# Patient Record
Sex: Female | Born: 1940 | Race: White | Hispanic: No | Marital: Single | Smoking: Never smoker
Health system: Southern US, Community
[De-identification: ages and names within clinical notes are randomized; demographics above are authoritative.]

## PROBLEM LIST (undated history)

## (undated) DIAGNOSIS — N952 Postmenopausal atrophic vaginitis: Secondary | ICD-10-CM

## (undated) DIAGNOSIS — M1990 Unspecified osteoarthritis, unspecified site: Secondary | ICD-10-CM

## (undated) DIAGNOSIS — R002 Palpitations: Secondary | ICD-10-CM

## (undated) DIAGNOSIS — G43909 Migraine, unspecified, not intractable, without status migrainosus: Secondary | ICD-10-CM

## (undated) DIAGNOSIS — I10 Essential (primary) hypertension: Secondary | ICD-10-CM

## (undated) DIAGNOSIS — Z87448 Personal history of other diseases of urinary system: Secondary | ICD-10-CM

## (undated) DIAGNOSIS — I4891 Unspecified atrial fibrillation: Secondary | ICD-10-CM

## (undated) DIAGNOSIS — N63 Unspecified lump in unspecified breast: Secondary | ICD-10-CM

## (undated) DIAGNOSIS — G2581 Restless legs syndrome: Secondary | ICD-10-CM

## (undated) DIAGNOSIS — E785 Hyperlipidemia, unspecified: Secondary | ICD-10-CM

## (undated) HISTORY — PX: TOTAL ABDOMINAL HYSTERECTOMY: SHX209

## (undated) HISTORY — DX: Unspecified lump in unspecified breast: N63.0

## (undated) HISTORY — DX: Essential (primary) hypertension: I10

## (undated) HISTORY — DX: Restless legs syndrome: G25.81

## (undated) HISTORY — DX: Personal history of other diseases of urinary system: Z87.448

## (undated) HISTORY — DX: Unspecified osteoarthritis, unspecified site: M19.90

## (undated) HISTORY — DX: Hyperlipidemia, unspecified: E78.5

## (undated) HISTORY — DX: Postmenopausal atrophic vaginitis: N95.2

## (undated) HISTORY — PX: HERNIA REPAIR: SHX51

## (undated) HISTORY — DX: Palpitations: R00.2

## (undated) HISTORY — PX: APPENDECTOMY: SHX54

## (undated) HISTORY — PX: COLONOSCOPY: SHX174

---

## 2000-08-27 ENCOUNTER — Encounter: Payer: Self-pay | Admitting: Family Medicine

## 2000-08-27 ENCOUNTER — Ambulatory Visit (HOSPITAL_COMMUNITY): Admission: RE | Admit: 2000-08-27 | Discharge: 2000-08-27 | Payer: Self-pay | Admitting: Family Medicine

## 2000-09-01 ENCOUNTER — Ambulatory Visit (HOSPITAL_COMMUNITY): Admission: RE | Admit: 2000-09-01 | Discharge: 2000-09-01 | Payer: Self-pay | Admitting: Family Medicine

## 2000-09-01 ENCOUNTER — Encounter: Payer: Self-pay | Admitting: Family Medicine

## 2000-09-04 ENCOUNTER — Ambulatory Visit (HOSPITAL_COMMUNITY): Admission: RE | Admit: 2000-09-04 | Discharge: 2000-09-04 | Payer: Self-pay | Admitting: Family Medicine

## 2000-09-04 ENCOUNTER — Encounter: Payer: Self-pay | Admitting: Family Medicine

## 2000-10-28 ENCOUNTER — Ambulatory Visit (HOSPITAL_COMMUNITY): Admission: RE | Admit: 2000-10-28 | Discharge: 2000-10-28 | Payer: Self-pay | Admitting: Gastroenterology

## 2001-08-20 ENCOUNTER — Encounter: Payer: Self-pay | Admitting: Specialist

## 2001-08-20 ENCOUNTER — Ambulatory Visit (HOSPITAL_COMMUNITY): Admission: RE | Admit: 2001-08-20 | Discharge: 2001-08-20 | Payer: Self-pay | Admitting: Specialist

## 2002-03-16 ENCOUNTER — Encounter: Admission: RE | Admit: 2002-03-16 | Discharge: 2002-03-16 | Payer: Self-pay | Admitting: Family Medicine

## 2002-03-16 ENCOUNTER — Encounter: Payer: Self-pay | Admitting: Family Medicine

## 2002-04-10 ENCOUNTER — Encounter: Admission: RE | Admit: 2002-04-10 | Discharge: 2002-04-10 | Payer: Self-pay | Admitting: Neurology

## 2002-04-10 ENCOUNTER — Encounter: Payer: Self-pay | Admitting: Neurology

## 2002-11-10 ENCOUNTER — Encounter: Payer: Self-pay | Admitting: Specialist

## 2002-11-10 ENCOUNTER — Ambulatory Visit (HOSPITAL_COMMUNITY): Admission: RE | Admit: 2002-11-10 | Discharge: 2002-11-10 | Payer: Self-pay | Admitting: Specialist

## 2002-12-05 ENCOUNTER — Encounter: Payer: Self-pay | Admitting: Neurology

## 2002-12-05 ENCOUNTER — Encounter: Admission: RE | Admit: 2002-12-05 | Discharge: 2002-12-05 | Payer: Self-pay | Admitting: Neurology

## 2003-08-05 HISTORY — PX: CARDIAC CATHETERIZATION: SHX172

## 2003-10-01 ENCOUNTER — Emergency Department (HOSPITAL_COMMUNITY): Admission: EM | Admit: 2003-10-01 | Discharge: 2003-10-01 | Payer: Self-pay | Admitting: Emergency Medicine

## 2003-11-24 ENCOUNTER — Ambulatory Visit (HOSPITAL_COMMUNITY): Admission: RE | Admit: 2003-11-24 | Discharge: 2003-11-24 | Payer: Self-pay | Admitting: Otolaryngology

## 2004-01-11 ENCOUNTER — Encounter: Payer: Self-pay | Admitting: Cardiology

## 2004-01-11 ENCOUNTER — Encounter (INDEPENDENT_AMBULATORY_CARE_PROVIDER_SITE_OTHER): Payer: Self-pay | Admitting: Physician Assistant

## 2004-01-11 ENCOUNTER — Inpatient Hospital Stay (HOSPITAL_COMMUNITY): Admission: EM | Admit: 2004-01-11 | Discharge: 2004-01-13 | Payer: Self-pay | Admitting: Emergency Medicine

## 2004-01-12 ENCOUNTER — Encounter: Payer: Self-pay | Admitting: Cardiology

## 2004-04-09 ENCOUNTER — Ambulatory Visit (HOSPITAL_COMMUNITY): Admission: RE | Admit: 2004-04-09 | Discharge: 2004-04-09 | Payer: Self-pay | Admitting: Obstetrics and Gynecology

## 2004-04-17 ENCOUNTER — Encounter: Admission: RE | Admit: 2004-04-17 | Discharge: 2004-04-17 | Payer: Self-pay | Admitting: Obstetrics and Gynecology

## 2004-06-17 ENCOUNTER — Ambulatory Visit (HOSPITAL_COMMUNITY): Admission: RE | Admit: 2004-06-17 | Discharge: 2004-06-17 | Payer: Self-pay | Admitting: Gastroenterology

## 2004-06-17 ENCOUNTER — Encounter (INDEPENDENT_AMBULATORY_CARE_PROVIDER_SITE_OTHER): Payer: Self-pay | Admitting: *Deleted

## 2004-10-24 ENCOUNTER — Ambulatory Visit: Payer: Self-pay | Admitting: Cardiology

## 2005-04-14 ENCOUNTER — Ambulatory Visit (HOSPITAL_COMMUNITY): Admission: RE | Admit: 2005-04-14 | Discharge: 2005-04-14 | Payer: Self-pay | Admitting: Specialist

## 2006-04-15 ENCOUNTER — Ambulatory Visit (HOSPITAL_COMMUNITY): Admission: RE | Admit: 2006-04-15 | Discharge: 2006-04-15 | Payer: Self-pay | Admitting: Specialist

## 2007-02-16 ENCOUNTER — Encounter: Payer: Self-pay | Admitting: Cardiology

## 2007-02-16 ENCOUNTER — Ambulatory Visit: Payer: Self-pay | Admitting: Cardiology

## 2007-08-03 ENCOUNTER — Ambulatory Visit (HOSPITAL_COMMUNITY): Admission: RE | Admit: 2007-08-03 | Discharge: 2007-08-03 | Payer: Self-pay | Admitting: Obstetrics and Gynecology

## 2008-08-21 ENCOUNTER — Ambulatory Visit (HOSPITAL_COMMUNITY): Admission: RE | Admit: 2008-08-21 | Discharge: 2008-08-21 | Payer: Self-pay | Admitting: Family Medicine

## 2008-09-02 ENCOUNTER — Encounter: Payer: Self-pay | Admitting: Cardiology

## 2009-02-23 ENCOUNTER — Encounter: Payer: Self-pay | Admitting: Cardiology

## 2009-04-01 ENCOUNTER — Ambulatory Visit: Payer: Self-pay | Admitting: Cardiology

## 2009-04-03 DIAGNOSIS — R002 Palpitations: Secondary | ICD-10-CM

## 2009-04-04 ENCOUNTER — Encounter: Payer: Self-pay | Admitting: Cardiology

## 2009-04-13 ENCOUNTER — Ambulatory Visit: Payer: Self-pay | Admitting: Cardiology

## 2009-04-13 DIAGNOSIS — E663 Overweight: Secondary | ICD-10-CM | POA: Insufficient documentation

## 2009-04-13 DIAGNOSIS — I2510 Atherosclerotic heart disease of native coronary artery without angina pectoris: Secondary | ICD-10-CM | POA: Insufficient documentation

## 2009-04-13 DIAGNOSIS — F411 Generalized anxiety disorder: Secondary | ICD-10-CM

## 2009-06-04 ENCOUNTER — Encounter: Payer: Self-pay | Admitting: Cardiology

## 2009-08-24 ENCOUNTER — Ambulatory Visit (HOSPITAL_COMMUNITY): Admission: RE | Admit: 2009-08-24 | Discharge: 2009-08-24 | Payer: Self-pay | Admitting: Obstetrics and Gynecology

## 2009-09-29 ENCOUNTER — Emergency Department (HOSPITAL_COMMUNITY)
Admission: EM | Admit: 2009-09-29 | Discharge: 2009-09-29 | Payer: Self-pay | Source: Home / Self Care | Admitting: Emergency Medicine

## 2009-09-29 ENCOUNTER — Encounter: Payer: Self-pay | Admitting: Cardiology

## 2009-10-14 ENCOUNTER — Encounter: Payer: Self-pay | Admitting: Cardiology

## 2010-08-19 ENCOUNTER — Encounter: Payer: Self-pay | Admitting: Cardiology

## 2010-08-19 ENCOUNTER — Ambulatory Visit
Admission: RE | Admit: 2010-08-19 | Discharge: 2010-08-19 | Payer: Self-pay | Source: Home / Self Care | Attending: Cardiology | Admitting: Cardiology

## 2010-08-19 ENCOUNTER — Encounter (INDEPENDENT_AMBULATORY_CARE_PROVIDER_SITE_OTHER): Payer: Self-pay | Admitting: *Deleted

## 2010-08-21 ENCOUNTER — Encounter: Payer: Self-pay | Admitting: Cardiology

## 2010-08-26 ENCOUNTER — Ambulatory Visit (HOSPITAL_COMMUNITY)
Admission: RE | Admit: 2010-08-26 | Discharge: 2010-08-26 | Payer: Medicare Other | Source: Home / Self Care | Attending: Obstetrics and Gynecology | Admitting: Obstetrics and Gynecology

## 2010-08-29 ENCOUNTER — Other Ambulatory Visit (HOSPITAL_COMMUNITY): Payer: Self-pay | Admitting: *Deleted

## 2010-08-29 ENCOUNTER — Encounter (INDEPENDENT_AMBULATORY_CARE_PROVIDER_SITE_OTHER): Payer: Self-pay | Admitting: *Deleted

## 2010-08-29 DIAGNOSIS — R928 Other abnormal and inconclusive findings on diagnostic imaging of breast: Secondary | ICD-10-CM

## 2010-09-04 ENCOUNTER — Other Ambulatory Visit: Payer: Self-pay

## 2010-09-04 ENCOUNTER — Ambulatory Visit (HOSPITAL_COMMUNITY)
Admission: RE | Admit: 2010-09-04 | Discharge: 2010-09-04 | Disposition: A | Discharge status: Home / Self Care | Payer: Medicare Other | Source: Ambulatory Visit

## 2010-09-04 ENCOUNTER — Ambulatory Visit (HOSPITAL_COMMUNITY)
Admission: RE | Admit: 2010-09-04 | Discharge: 2010-09-04 | Disposition: A | Discharge status: Home / Self Care | Payer: Medicare Other | Source: Ambulatory Visit | Attending: Obstetrics and Gynecology | Admitting: Obstetrics and Gynecology

## 2010-09-04 ENCOUNTER — Ambulatory Visit (HOSPITAL_COMMUNITY): Admission: RE | Admit: 2010-09-04 | Payer: Self-pay | Source: Home / Self Care | Admitting: Obstetrics and Gynecology

## 2010-09-04 DIAGNOSIS — N63 Unspecified lump in unspecified breast: Secondary | ICD-10-CM | POA: Insufficient documentation

## 2010-09-04 DIAGNOSIS — R928 Other abnormal and inconclusive findings on diagnostic imaging of breast: Secondary | ICD-10-CM

## 2010-09-04 DIAGNOSIS — N6320 Unspecified lump in the left breast, unspecified quadrant: Secondary | ICD-10-CM

## 2010-09-05 NOTE — Letter (Signed)
Summary: Discharge Summary  Discharge Summary   Imported By: Dorise Hiss 08/19/2010 14:46:22  _____________________________________________________________________  External Attachment:    Type:   Image     Comment:   External Document

## 2010-09-05 NOTE — Assessment & Plan Note (Signed)
Summary: Hailey [name]   Visit Type:  Follow-up Primary Provider:  Dr. Donzetta Sprung  CC:  palpitations.  History of Present Illness: The patient presents for followup palpitations. She has had these for 2 years every night. She wore an event monitor in August of 2021 days. There were rare PVCs but did not correspond to her symptoms. When she didn't feel her nightly palpitations she was always in normal sinus rhythm. Please continue unabated. She did present to the emergency room and that you are in last year with similar symptoms but there were no abnormalities identified. She does rarely gets chest discomfort which feels like a fist in her chest but this is nonreproducible. She does do some walking but infrequently. With this activity she cannot bring on chest discomfort, excessive shortness of breath, palpitations. She has no presyncope, syncope, PND or orthopnea. She is quite concerned because her brother died of heart disease suddenly though she then reports that he was a heavy smoker and drinker.  Preventive Screening-Counseling & Management  Alcohol-Tobacco     Smoking Status: never  Current Medications (verified): 1)  Metoprolol Tartrate 50 Mg Tabs (Metoprolol Tartrate) .... Take 1 Tablet By Mouth Every Am 2)  Centrum Silver  Tabs (Multiple Vitamins-Minerals) .... Take 1 Tablet By Mouth Once A Day 3)  Calcium Carbonate-Vitamin D 600-400 Mg-Unit  Tabs (Calcium Carbonate-Vitamin D) .... Take 1 Tablet By Mouth Two Times A Day 4)  Estroven Maximum Strength  Tabs (Nutritional Supplements) .... Take 1 Tablet By Mouth Once A Day 5)  Metoprolol Tartrate 25 Mg Tabs (Metoprolol Tartrate) .... Take 1 Tablet By Mouth Every Night  Allergies (verified): No Known Drug Allergies  Comments:  Nurse/Medical Assistant: The patient's medications and allergies were reviewed with the patient and were updated in the Medication and Allergy Lists. reviewed list w/ pt. Tammi Romine CMA (August 19, 2010 3:22 PM)  Past History:  Past Medical History: Reviewed history from 09/10/[name] and no changes required. Meniere's disease PALPITATIONS (ICD-785. Hyperlipidemia   Past Surgical History: Reviewed history from 08/19/2010 and no changes required. Cardiac Catheterization  2005  [facility] Abdominal Hysterectomy-Total Appendectomy  Social History: Smoking Status:  never  Review of Systems       As stated in the HPI and negative for all other systems.   Vital Signs:  Patient profile:   70 year old female Height:      68 inches Weight:      188 pounds BMI:     28.69 Pulse rate:   55 / minute BP sitting:   129 / 75  (left arm) Cuff size:   regular  Vitals Entered By: Fuller Plan CMA (August 19, 2010 3:22 PM)  Physical Exam  General:  Well developed, well nourished, in no acute distress. Head:  normocephalic and atraumatic Eyes:  PERRLA/EOM intact; conjunctiva and lids normal. Mouth:  Teeth, gums and palate normal. Oral mucosa normal. Neck:  Neck supple, no JVD. No masses, thyromegaly or abnormal cervical nodes. Chest Wall:  no deformities  Lungs:  Clear bilaterally to auscultation and percussion. Abdomen:  Bowel sounds positive; abdomen soft and non-tender without masses, organomegaly, or hernias noted. No hepatosplenomegaly. Msk:  Back normal, normal gait. Muscle strength and tone normal. Extremities:  No clubbing or cyanosis. Neurologic:  Alert and oriented x 3. Skin:  Intact without lesions or rashes. Cervical Nodes:  no significant adenopathy Axillary Nodes:  no significant adenopathy Inguinal Nodes:  no significant adenopathy Psych:  Normal affect.  Detailed Cardiovascular Exam  Neck    Carotids: Carotids full and equal bilaterally without bruits.      Neck Veins: Normal, no JVD.    Heart    Inspection: no deformities or lifts noted.      Palpation: normal PMI with no thrills palpable.      Auscultation: regular rate and rhythm, S1, S2 without murmurs,  rubs, gallops, or clicks.    Vascular    Abdominal Aorta: no palpable masses, pulsations, or audible bruits.      Femoral Pulses: normal femoral pulses bilaterally.      Pedal Pulses: normal pedal pulses bilaterally.      Radial Pulses: normal radial pulses bilaterally.      Peripheral Circulation: no clubbing, cyanosis, or edema noted with normal capillary refill.     EKG  Procedure date:  08/19/2010  Findings:      sinus bradycardia, rate 60, axis within normal limits, poor anterior R-wave progression, low voltage in chest leads, nonspecific anterolateral and inferior T-wave flattening, QTC difficult to assess but does not appear to be prolonged  Impression & Recommendations:  Problem # 1:  CORONARY ARTERY DISEASE (ICD-414.00) She did have some mild nonobstructive plaquing 2005 on catheter. Her last stress test was a nuclear study in 2008. I do think it is prudent to screen her with exercise treadmill testing to look for any high grade obstructive disease and more importantly to give her a prescription for exercise based on this. She will return for this. Orders: EKG w/ Interpretation (93000) GXT (GXT)  Problem # 2:  PALPITATIONS (ICD-785.1) We discussed these at length. They're exactly the same as when she is more previous monitors. Back she hasn't had any for 2 days which is unusual. I do not think there is any utility in repeating a monitor and once her symptoms have changed. She agrees with this and no change in therapy is indicated. Orders: EKG w/ Interpretation (93000) GXT (GXT)  Problem # 3:  GENERALIZED ANXIETY DISORDER (ICD-300.02) I do think this contributes and with her per her primary provider.  Patient Instructions: 1)  Your physician wants you to follow-up in: 1 year. You will receive a reminder letter in the mail one-two months in advance. If you don't receive a letter, please call our office to schedule the follow-up appointment. 2)  Your physician recommends  that you continue on your current medications as directed. Please refer to the Current Medication list given to you today. 3)  Your physician has requested that you have an exercise tolerance test.  For further information please visit [email or web address]  Please also follow instruction sheet, as given.

## 2010-09-05 NOTE — Letter (Signed)
Summary: Engineer, materials at [facility]  [address]   [place], [place] [address]   Phone: [phone]  Fax: [phone]        August 29, 2010 MRN: [id number]    Hailey [name] [address] [place], [place]  [address]    Dear [name],  Your test ordered by Selena Batten has been reviewed by your physician (or physician assistant) and was found to be normal or stable. Your physician (or physician assistant) felt no changes were needed at this time.  ____ Echocardiogram  _X__ Cardiac Stress Test  ____ Lab Work  ____ Peripheral vascular study of arms, legs or neck  ____ CT scan or X-ray  ____ Lung or Breathing test  ____ Other:   Thank you.   Cyril Loosen, RN, BSN    Duane Boston, M.D., F.A.C.C. Thressa Sheller, M.D., F.A.C.C. Oneal Grout, M.D., F.A.C.C. Cheree Ditto, M.D., F.A.C.C. Daiva Nakayama, M.D., F.A.C.C. Kenney Houseman, M.D., F.A.C.C. Jeanne Ivan, PA-C

## 2010-09-05 NOTE — Letter (Signed)
Summary: Graded Exercise Tolerance Test  [facility] [address]   [place], [place] [address]   Phone: [phone]  Fax: [phone]      [facility] Cardi[facility] Services  Graded Exercise Tolerance Test    [facility]  Appointment Date:_  Appointment Time:_   Your doctor has ordered a stress test to help determine the condition of your  heart during exercise. If you take blood pressure medicine , ask your doctor if you should take it the day of your test. You may eat a light meal before your test.  Please be sure to bring the copy of your order with you.   You should dress comfortably, for example: Sweat pants, shorts, or skirt, Loose                                                                                                       short sleeved T-shirt, Rubber soled lace-up shoes (tennis shoes)  You will need to arrive 15 minutes before your appointment time. You will also need to enter at the Main Entrance of the hospital and go to the registration desk. They will direct you to the Cardiovascular Department on the third floor.  You will need to plan on being at the hospital for one hour from registration for this appointment.   Hold your Metoprolol (Lopressor) for 24 hours before test.   If the results of your test are normal or stable, you will receive a letter. If they are abnormal, the nurse will contact you by phone.

## 2010-09-05 NOTE — Cardiovascular Report (Signed)
Summary: Cardiac Catheterization  Cardiac Catheterization   Imported By: Dorise Hiss 08/19/2010 14:44:33  _____________________________________________________________________  External Attachment:    Type:   Image     Comment:   External Document

## 2010-09-09 ENCOUNTER — Other Ambulatory Visit: Payer: Self-pay | Admitting: Dermatology

## 2010-09-09 ENCOUNTER — Encounter (HOSPITAL_COMMUNITY): Payer: Self-pay

## 2010-10-11 ENCOUNTER — Other Ambulatory Visit: Payer: Self-pay | Admitting: Dermatology

## 2010-10-23 LAB — COMPREHENSIVE METABOLIC PANEL
ALT: 19 U/L (ref 0–35)
BUN: 18 mg/dL (ref 6–23)
CO2: 27 mEq/L (ref 19–32)
Calcium: 9.1 mg/dL (ref 8.4–10.5)
GFR calc Af Amer: 45 mL/min — ABNORMAL LOW (ref >60)
Glucose, Bld: 100 mg/dL — ABNORMAL HIGH (ref 70–99)
Potassium: 4 mEq/L (ref 3.5–5.1)
Sodium: 139 mEq/L (ref 135–145)

## 2010-10-23 LAB — URINALYSIS, ROUTINE W REFLEX MICROSCOPIC
Glucose, UA: NEGATIVE mg/dL
Ketones, ur: NEGATIVE mg/dL
Leukocytes, UA: NEGATIVE
Nitrite: NEGATIVE
Protein, ur: NEGATIVE mg/dL
pH: 5.5 (ref 5.0–8.0)

## 2010-10-23 LAB — URINE MICROSCOPIC-ADD ON

## 2010-10-23 LAB — DIFFERENTIAL
Basophils Absolute: 0 10*3/uL (ref 0.0–0.1)
Eosinophils Absolute: 0.1 10*3/uL (ref 0.0–0.7)
Eosinophils Relative: 1 % (ref 0–5)
Lymphocytes Relative: 38 % (ref 12–46)
Lymphs Abs: 2.6 10*3/uL (ref 0.7–4.0)
Monocytes Absolute: 0.5 10*3/uL (ref 0.1–1.0)

## 2010-10-23 LAB — POCT CARDIAC MARKERS: CKMB, poc: <1 ng/mL — ABNORMAL LOW (ref 1.0–8.0)

## 2010-10-23 LAB — CBC
Platelets: 219 10*3/uL (ref 150–400)
RBC: 3.97 MIL/uL (ref 3.87–5.11)
WBC: 6.8 10*3/uL (ref 4.0–10.5)

## 2010-11-22 ENCOUNTER — Other Ambulatory Visit (HOSPITAL_COMMUNITY): Payer: Self-pay | Admitting: Family Medicine

## 2010-11-22 DIAGNOSIS — N6320 Unspecified lump in the left breast, unspecified quadrant: Secondary | ICD-10-CM

## 2010-11-25 ENCOUNTER — Other Ambulatory Visit: Payer: Self-pay | Admitting: Dermatology

## 2010-12-16 ENCOUNTER — Other Ambulatory Visit: Payer: Self-pay | Admitting: Obstetrics & Gynecology

## 2010-12-16 DIAGNOSIS — N63 Unspecified lump in unspecified breast: Secondary | ICD-10-CM

## 2010-12-16 DIAGNOSIS — Z139 Encounter for screening, unspecified: Secondary | ICD-10-CM

## 2010-12-19 ENCOUNTER — Ambulatory Visit (HOSPITAL_COMMUNITY)
Admission: RE | Admit: 2010-12-19 | Discharge: 2010-12-19 | Disposition: A | Discharge status: Home / Self Care | Payer: Medicare Other | Source: Ambulatory Visit | Attending: Obstetrics & Gynecology | Admitting: Obstetrics & Gynecology

## 2010-12-19 DIAGNOSIS — Z13820 Encounter for screening for osteoporosis: Secondary | ICD-10-CM | POA: Insufficient documentation

## 2010-12-19 DIAGNOSIS — Z780 Asymptomatic menopausal state: Secondary | ICD-10-CM | POA: Insufficient documentation

## 2010-12-19 DIAGNOSIS — Z139 Encounter for screening, unspecified: Secondary | ICD-10-CM

## 2010-12-20 NOTE — Op Note (Signed)
NAMEHISAE, DECOURSEY NO.:  [id number]   MEDICAL RECORD NO.:  [id number]          PATIENT TYPE:  AMB   LOCATION:  ENDO                         FACILITY:  [facility]   PHYSICIAN:  Petra Kuba, M.D.    DATE OF BIRTH:  [birth year]   DATE OF PROCEDURE:  06/17/2004  DATE OF DISCHARGE:                                 OPERATIVE REPORT   PROCEDURE:  Esophagogastroduodenoscopy with biopsy.   ENDOSCOPIST:  Petra Kuba, M.D.   INDICATION:  Upper tract symptoms, not responding to the usual medicines.  Consent was signed after risks, benefits, methods, and options were  thoroughly discussed in the office.   MEDICINES USED:  Fentanyl 50 mcg, Versed 5 mg.   DESCRIPTION OF PROCEDURE:  The video endoscope was inserted by direct  vision.  Esophagus proximal and mid were normal.  In the distal esophagus  was a tiny hiatal hernia, possibly a very short segment of Barrett's which  was biopsied as well as the GE junction at the end of the procedure and put  in the first and only container.  The scope was passed into the stomach and  advanced through a normal antrum, normal pylorus, into a normal duodenal  bulb and around the celiac to a normal second portion of the duodenum.  The  scope was withdrawn back to the bulb which confirmed its normal appearance.  The scope was withdrawn back to the stomach and retroflexed.  The cardia,  fundus, angularis, lesser and greater curve were normal on retroflexed  visualization.  On straight visualization, the stomach was normal.  The air  was suctioned.  The scope was slowly withdrawn back to about 18 cm.  Again,  the rest of the esophagus was normal.  We then re-advanced to the  distal  esophagus. The biopsies were obtained at the juncture.  The scope was  removed.  The patient tolerated the procedure well.  There was no obvious  immediate complication.   ENDOSCOPIC DIAGNOSES:  1.  Tiny hiatal hernia.  2.  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  3.  Otherwise normal esophagogastroduodenoscopy.   PLAN:  Await pathology to determine future Barrett's screening, continue  trying various pump inhibitors, possibly b.i.d., possibly even Reglan.  Follow up with me p.r.n. or in two months.      MEM/MEDQ  D:  06/17/2004  T:  06/17/2004  Job:  [id number]   cc:   Magnus Sinning. Dimple Casey, M.D.  [address] [place]  [place] [address]  Fax: [phone]

## 2010-12-20 NOTE — H&P (Signed)
NAME:  Hailey [name], Hailey [name]                         ACCOUNT NO.:  [id number]   MEDICAL RECORD NO.:  [id number]                   PATIENT TYPE:  INP   LOCATION:  4731                                 FACILITY:  [facility]   PHYSICIAN:  Arturo Morton. Riley Kill, M.D. [facility]         DATE OF BIRTH:  1941/07/28   DATE OF ADMISSION:  01/11/2004  DATE OF DISCHARGE:                                HISTORY & PHYSICAL   CHIEF COMPLAINT:  Chest pain.   HISTORY OF PRESENT ILLNESS:  The patient is a 70 year old white female who  presents to the emergency room with a four-month history of episodic  substernal chest discomfort.  Some of it occurs at rest, and some with  exertion.  Today she had 5/10 chest pain, and came to the emergency room  where she was seen and started on some nitroglycerin.  With this, she had  borderline ST-T wave abnormalities.  There was no change with position,  cough or deep inspiration and no associated diaphoresis, nausea or vomiting.  She has had some tachy palpitations that have occurred for the past three  months as well.  She was at an overnight admission at [facility] and  apparently told by the nursing staff that she might have paroxysmal atrial  fibrillation which perhaps was brief two to three seconds at most.   PAST MEDICAL HISTORY:  1. The patient had a treadmill in 2005 by Dr. Rollene Rotunda.  She went     seven minutes without diagnostic chest pain or EKG changes.  2. Meniere's disease.  3. Borderline hyperlipidemia.  4. History of colonoscopy.  5. History of hysterectomy.  6. History of appendectomy.   DRUG SENSITIVITIES:  Include aspirin which causes gastroesophageal reflux.   MEDICATIONS:  1. Hydrochlorothiazide, triamterene 37/25.  2. Potassium 10 mEq t.i.d.  3. Lexapro 10 daily.  4. Aciphex 20 daily.  5. Advil.  6. Multivitamin p.r.n.   SOCIAL HISTORY:  The patient lives in [place].  She lives alone.  She is a  retired [employer].  She does not smoke or  drink.   FAMILY HISTORY:  Mother died at 24 of an infection.  Father died at 77, and  had coronary artery disease.  She has a sister who has mitral valve  prolapse.  She has a brother who has had transient ischemic attacks.   REVIEW OF SYSTEMS:  Remarkable for some chronic dyspnea of effort that has  been more noticeable.  She has had some arthralgias.   PHYSICAL EXAMINATION:  GENERAL:  The patient is an alert and oriented  female, in no acute distress.  She can provide a valuable history.  She is  well developed, well-nourished.  No carotid bruits.  VITAL SIGNS:  Temperature 97.4, pulse 60, respiratory rate 18, blood  pressure 131/77.  LUNGS:  The lung fields are clear to auscultation and percussion.  HEART:  The PMI is nondisplaced.  There are normal first and second heart  sounds.  No systolic or diastolic murmurs are appreciated.  ABDOMEN:  Soft without hepatosplenomegaly.  EXTREMITIES:  Revealed no edema.  Distal pulses are intact.   ELECTROCARDIOGRAM:  The electrocardiogram done in the emergency room  demonstrates normal sinus rhythm with T wave inversion in V2 and V3,  flattening of the T waves in V4 and V5, as well as T wave inversion in lead  3.   LABORATORY DATA:  Creatinine 1.3.  Other laboratory studies are pending.  White count 6400, hemoglobin 13, hematocrit 38.6 and platelets 306,000.   IMPRESSION:  1. Recurrent chest discomfort despite normal treadmill with subtle     electrocardiographic abnormalities.  2. Borderline hyperlipidemia.  3. Hiatal hernia.  4. Appendectomy.  5. Hysterectomy.  6. Question gastroesophageal reflux.   PLAN:  I have discussed the various options with the patient.  She wants to  know what is going on.  We had talked about cardiac catheterization versus  doing a stress Cardiolite image given the findings.  We will recommend  cardiac catheterization.  The risks, benefits and alternatives have been   discussed with the patient, and she is agreeable to proceed.                                                Arturo Morton. Riley Kill, M.D. [facility]    TDS/MEDQ  D:  01/11/2004  T:  01/12/2004  Job:  [id number]

## 2010-12-20 NOTE — Discharge Summary (Signed)
NAME:  Hailey [name], Hailey [name]                         ACCOUNT NO.:  [id number]   MEDICAL RECORD NO.:  [id number]                   PATIENT TYPE:  INP   LOCATION:  4731                                 FACILITY:  [facility]   PHYSICIAN:  Arturo Morton. Riley Kill, M.D. [facility]         DATE OF BIRTH:  1940/09/07   DATE OF ADMISSION:  01/11/2004  DATE OF DISCHARGE:  01/13/2004                                 DISCHARGE SUMMARY   PROCEDURES:  1. Cardiac catheterization.  2. Coronary arteriogram.  3. Left ventriculogram.   HISTORY OF PRESENT ILLNESS:  Hailey [name] is a 70 year old female with no  known history of coronary artery disease.  She complains of a four-month  history of episodic substernal chest pain that occurs at rest and with  exertion.  It reaches a 5/10 at its worse and occasionally radiates to her  left chest.  There are no associated symptoms.  It last varying amounts of  time from a few minutes to approximately an hour.  She is not able to  remember any aggravating factors and she has tried no medications for this  and there are no alleviating factors known.  She currently presents with 2-  3/10 chest pain.  She had a treadmill test in our office performed by Dr.  Antoine Poche, who evaluated her for these symptoms in February of 2005.  The  treadmill showed poor exercise tolerance, but no significant EKG changes.  No Cardiolite was done.  She also has a history of tachypalpitations for  about three months.  She went to [facility] weekend  and was admitted overnight for observation for chest pain.  Her enzymes were  negative and she was discharged home the next day.  She had an episode of  these tachypalpitations while she was there and was told by the nursing  staff that it was atrial fibrillation.  She was admitted for further  evaluation and treatment.   LABORATORY DATA:  Her enzymes were negative for MI and her D-dimer was  negative for PE.  On her comprehensive  metabolic panel, her potassium was  low at 3.3, her total protein was a little low at 5.5, and her albumin was a  little low at 3.2.  Other values were within normal limits.   HOSPITAL COURSE:  Dr. Riley Kill evaluated Hailey [name] and felt that cardiac  catheterization was the best option.  This was performed on January 12, 2004.  The cardiac catheterization showed a 30% proximal LAD, but the RCA and  circumflex were without disease.  Her left ventricular function was normal  at 60% and there were no wall motion abnormalities.  Dr. Juanda Chance evaluated  the films and felt she had nonobstructive coronary artery disease.  He felt  that she could be discharged home and follow up with her family physician.  She can be seen again by cardiology on a p.r.n. basis.  Post catheterization  there  was some hemorrhage on the wedge dressing and some ecchymosis around  the site.  Once her bed rest is completed, this dressing will be removed and  the site will be checked.  If she is stable and has no problems with  ambulation, she is possibly discharged later today and follow up as an  outpatient with primary care.   LABORATORY VALUES:  Hemoglobin 12.3, hematocrit 36.0, WBC 5.7, platelets  266.  Sodium 143, potassium 3.3, chloride 110, CO2 27, BUN 14, creatinine  1.4, glucose 88.  D-dimer 0.38.   DISCHARGE CONDITION:  Improved.   DISCHARGE DIAGNOSES:  1. Chest pain.  No significant coronary artery disease by catheterization.     D-dimer negative for pulmonary embolism.  2. Status post echocardiogram with an ejection fraction 55-65% and no     regional wall motion abnormalities.  There was copious epicardial fat and     the right ventricular size was at the upper limits of normal.  The     estimated peak pulmonary artery systolic pressure could not be     determined.  3. Meniere's disease.  4. Borderline hyperlipidemia.  5. Family history of coronary artery disease.  6. Questionable history of paroxysmal  atrial fibrillation/tachypalpitations.  7. Osteoarthritis.  8. History of intolerance to aspirin secondary to reflux symptoms.  9. Status post colonoscopy, hysterectomy, appendectomy, and gallbladder     ultrasound, which was last week.   DISCHARGE INSTRUCTIONS:  1. Her activity level is to include no strenuous activity for two days.  2. She is to call the office for problems with the catheterization site.  3. She is to stick to a low-fat and low-salt diet.  4. She is to have a BMET in one week.  5. She is to follow up with Dr. Dimple Casey and call for an appointment.  6. She is to see Dr. Antoine Poche as needed.   DISCHARGE MEDICATIONS:  1. K-Dur 20 mEq b.i.d.  2. HCT/triamterene 25/37.5 mg b.i.d.  3. Lexapro 10 mg daily.  4. Aciphex 20 mg daily.  5. Advil as prior to admission.  6. Multivitamins as prior to admission.      Theodore Demark, P.A. [facility]                  Thomas D. Riley Kill, M.D. [facility]    RB/MEDQ  D:  01/12/2004  T:  01/13/2004  Job:  [id number]   cc:   Rollene Rotunda, M.D.   Magnus Sinning. Dimple Casey, M.D.  [address] [place]  [place] [address]  Fax: [phone]

## 2010-12-20 NOTE — Cardiovascular Report (Signed)
NAME:  Hailey [name], Hailey [name]                         ACCOUNT NO.:  [id number]   MEDICAL RECORD NO.:  [id number]                   PATIENT TYPE:  INP   LOCATION:  4731                                 FACILITY:  [facility]   PHYSICIAN:  Charlies Constable, M.D. [facility]              DATE OF BIRTH:  1941-04-07   DATE OF PROCEDURE:  01/12/2004  DATE OF DISCHARGE:  01/13/2004                              CARDIAC CATHETERIZATION   CLINICAL HISTORY:  Hailey [name] is 70 years old and has no prior history of  known heart disease although she does have a positive family history for  coronary heart disease.  She has been having chest pain for several months  and was evaluated earlier this year with stress test which was negative.  Last week she was hospitalized overnight at [facility] with chest  pain, but she had recurrence and presented to our emergency room yesterday  and was admitted by Dr. Riley Kill.  Decision was made to evaluate with  angiography.   PROCEDURE:  The procedure was performed via the right femoral artery using  arterial sheath and 6 French preformed coronary catheters. A front wall  arterial puncture was performed and Omnipaque contrast was used.  The  patient tolerated the procedure well and left the laboratory in satisfactory  condition.   RESULTS:  Aortic pressure was 118/62 with mean of 86.  Left ventricular  pressure was 118/12.   Left main coronary artery:  The left main coronary was free of significant  disease.   Left anterior descending artery:  The left anterior descending artery gave  rise to two diagonal branches and three septal perforators.  There was 30%  narrowing in the proximal LAD.   Circumflex artery:  The circumflex artery gave rise to a small ramus branch,  marginal branch and an AV branch which terminated in small posterior lateral  branch.  These vessels were free of significant disease.   Right coronary artery:  The right coronary artery was a moderately  large  vessel that gave rise to a conus branch, right ventricular branch, posterior  descending branch, and two posterior lateral branches.  These vessels were  free of significant disease.   LEFT VENTRICULOGRAM:  The left ventriculogram performed in the RAO  projection showed good wall motion with no areas of hypokinesis.  The  estimated ejection fraction was 60%.   CONCLUSIONS:  Minimal nonobstructive coronary disease with 30% narrowing in  the proximal LAD and no obstruction in the circumflex and right coronary  arteries and normal LV function.   RECOMMENDATIONS:  Reassurance.  Will plan followup with Dr. Dimple Casey and decide  about further evaluation of her symptoms.  In view of these findings, it is  unlikely they are cardiac.  Charlies Constable, M.D. [facility]    BB/MEDQ  D:  01/12/2004  T:  01/14/2004  Job:  [id number]   cc:   Magnus Sinning. Dimple Casey, M.D.  [address] [place]  [place] [address]  Fax: [phone]   Rollene Rotunda, M.D.

## 2010-12-20 NOTE — Procedures (Signed)
[facility]  Patient:    Hailey [name], Hailey [name]                      MRN: [id number] Proc. Date: 10/28/00 Adm. Date:  [id number] Attending:  Nelda Marseille CC:         Shanda Bumps, M.D., [place], [place].   Procedure Report  PROCEDURE PERFORMED: Colonoscopy.  INDICATIONS:  Right lower quadrant pain.  Patient due for colonic screening.  PROCEDURE:  A consent was signed after the risks, benefits, methods, and options were thoroughly discussed in the office.  MEDICINES USED:  Demerol 60 and Versed 7.5.  PROCEDURE: Rectal inspection was pertinent for external hemorrhoids.  Digital exam was negative.  The video pediatric colonoscope was inserted with some difficulty due to a tortuous, long-looping transverse colon.  We were able to advance this into the cecum.  This did require rolling her on her back and various abdominal pressures.  The cecum was identified by the appendiceal orifice and the ileocecal valve.  In fact, the scope was inserted a short ways into the terminal ileum, which was normal.  Photo documentation was obtained. The scope was slowly withdrawn.  On insertion and slow withdrawal, no abnormalities were seen.  When we felt back around a tortuous loop, we did try to readvance around the curve to decrease the chance of missing things.  Prep was adequate.  There was some liquid stool that required washing and suctioning.  On slow withdrawal back to the rectum, no masses, polyps, diverticula, or other abnormalities were seen.  Once back in the rectum, the scope was retroflexed and pertinent for some internal hemorrhoids.  Anorectal pull-through confirmed the above.  The scope was reinserted a short ways.  Air was suctioned.  The scope was removed.  The patient tolerated the procedure well. There was no obvious immediate complication.  ENDOSCOPIC DIAGNOSES: 1. Internal and external hemorrhoids. 2. Tortuous long-looping transverse colon. 3.  Otherwise within normal limits to the terminal ileum.  PLAN: 1. Consider small bowel follow-through or a laparoscope and lysis of adhesions for her pain. 2. I would be happy to see her back p.r.n. 3. Otherwise, follow up with Dr. ______ to decide any further workup and plans. 4. Repeat colonic screening in 5-10 years with yearly rectals and guaiacs per Dr. ______ DD:  10/28/00 TD:  10/29/00 Job: [id number]

## 2010-12-25 ENCOUNTER — Ambulatory Visit (HOSPITAL_COMMUNITY)
Admission: RE | Admit: 2010-12-25 | Discharge: 2010-12-25 | Disposition: A | Discharge status: Home / Self Care | Payer: Medicare Other | Source: Ambulatory Visit | Attending: Obstetrics & Gynecology | Admitting: Obstetrics & Gynecology

## 2010-12-25 ENCOUNTER — Other Ambulatory Visit: Payer: Self-pay | Admitting: Obstetrics & Gynecology

## 2010-12-25 DIAGNOSIS — N63 Unspecified lump in unspecified breast: Secondary | ICD-10-CM

## 2011-01-13 ENCOUNTER — Other Ambulatory Visit: Payer: Self-pay | Admitting: Dermatology

## 2011-02-07 ENCOUNTER — Encounter: Payer: Self-pay | Admitting: Cardiology

## 2011-04-28 ENCOUNTER — Ambulatory Visit: Payer: Self-pay | Admitting: Cardiovascular Disease

## 2011-05-02 ENCOUNTER — Encounter: Payer: Self-pay | Admitting: Cardiovascular Disease

## 2011-08-01 ENCOUNTER — Other Ambulatory Visit: Payer: Self-pay | Admitting: Dermatology

## 2011-09-08 ENCOUNTER — Other Ambulatory Visit (HOSPITAL_COMMUNITY): Payer: Self-pay | Admitting: Family Medicine

## 2011-09-08 DIAGNOSIS — Z139 Encounter for screening, unspecified: Secondary | ICD-10-CM

## 2011-09-11 ENCOUNTER — Ambulatory Visit (HOSPITAL_COMMUNITY)
Admission: RE | Admit: 2011-09-11 | Discharge: 2011-09-11 | Disposition: A | Discharge status: Home / Self Care | Payer: Medicare Other | Source: Ambulatory Visit | Attending: Family Medicine | Admitting: Family Medicine

## 2011-09-11 DIAGNOSIS — Z139 Encounter for screening, unspecified: Secondary | ICD-10-CM

## 2011-09-11 DIAGNOSIS — Z1231 Encounter for screening mammogram for malignant neoplasm of breast: Secondary | ICD-10-CM | POA: Insufficient documentation

## 2011-10-16 ENCOUNTER — Other Ambulatory Visit: Payer: Self-pay

## 2011-10-16 DIAGNOSIS — R072 Precordial pain: Secondary | ICD-10-CM

## 2011-10-17 ENCOUNTER — Encounter: Payer: Self-pay | Admitting: Family Medicine

## 2011-11-10 ENCOUNTER — Encounter: Payer: Self-pay | Admitting: Cardiology

## 2011-11-10 ENCOUNTER — Ambulatory Visit (INDEPENDENT_AMBULATORY_CARE_PROVIDER_SITE_OTHER): Payer: Medicare Other | Admitting: Cardiology

## 2011-11-10 VITALS — BP 115/76 | HR 61 | Ht 67.0 in | Wt 175.0 lb

## 2011-11-10 DIAGNOSIS — R002 Palpitations: Secondary | ICD-10-CM

## 2011-11-10 DIAGNOSIS — I2510 Atherosclerotic heart disease of native coronary artery without angina pectoris: Secondary | ICD-10-CM

## 2011-11-10 NOTE — Patient Instructions (Signed)
   Keep appointment with Dr. Graciela Husbands as scheduled. Your physician recommends that you continue on your current medications as directed. Please refer to the Current Medication list given to you today. Your physician has recommended that you wear a holter monitor. Holter monitors are medical devices that record the heart's electrical activity. Doctors most often use these monitors to diagnose arrhythmias. Arrhythmias are problems with the speed or rhythm of the heartbeat. The monitor is a small, portable device. You can wear one while you do your normal daily activities. This is usually used to diagnose what is causing palpitations/syncope (passing out).

## 2011-11-10 NOTE — Assessment & Plan Note (Signed)
She has had nonobstructive coronary disease previously. Stress test as mentioned was normal. She will continue with risk reduction.

## 2011-11-10 NOTE — Assessment & Plan Note (Signed)
Dr. Reuel Boom has referred her to Dr. Graciela Husbands for evaluation of her palpitations. She has had normal electrolytes and she says her thyroid is checked annually and was normal. I will apply a 48 hour Holter monitor per Dr. Graciela Husbands to review. At this point no change in therapy is indicated.

## 2011-11-10 NOTE — Progress Notes (Signed)
   HPI The patient presents for followup of palpitations. Since I last saw her she was hospitalized. She actually went to the emergency room for heartburn. She was admitted and seen in consultation by our group. She had a stress test which demonstrated no evidence of ischemia or infarct. She had echocardiogram which demonstrated a well preserved ejection fraction. There were no significant valvular abnormalities. She continues to describe palpitations. These are skipping heartbeats. She had one severe episode that she described as a "jolt" which she thought might of been of palpitations. He does get some early morning chest discomfort. She says the palpitations are happening every day sometimes more than once a day. She describes an irregular beat is not sustained. She's not describing presyncope or syncope. Of note her heart rate was low in the hospital and so her beta blocker dose was reduced. However, she has more palpitations and since has gone back up to 75 mg daily now in a divided dose of metoprolol. She is going to start doing more activity. She says that with activity she's not getting any chest pain or shortness of breath. She has no PND or orthopnea. (I reviewed all hospital records.)   No Known Allergies  Current Outpatient Prescriptions  Medication Sig Dispense Refill  . Calcium Carbonate-Vit D-Min 600-400 MG-UNIT TABS Take by mouth 2 (two) times daily.        . metoprolol tartrate (LOPRESSOR) 25 MG tablet Take 25 mg by mouth 3 (three) times daily.       . Multiple Vitamins-Minerals (CENTRUM SILVER) tablet Take 1 tablet by mouth daily.        . Nutritional Supplements (ESTROVEN MAXIMUM STRENGTH) TABS Take by mouth daily.        . Omeprazole (PRILOSEC PO) Take by mouth. 1 tab twice a day      . DISCONTD: metoprolol (LOPRESSOR) 50 MG tablet Take 50 mg by mouth every morning.          Past Medical History  Diagnosis Date  . Meniere's disease   . Palpitations   . HLD (hyperlipidemia)      Past Surgical History  Procedure Date  . Appendectomy   . Cardiac catheterization 2005     cone  . Total abdominal hysterectomy    ROS: As stated in the HPI and negative for all other systems.  PHYSICAL EXAM BP 115/76  Pulse 61  Ht 5\' 7"  (1.702 m)  Wt 175 lb (79.379 kg)  BMI 27.41 kg/m2 GENERAL:  Well appearing HEENT:  Pupils equal round and reactive, fundi not visualized, oral mucosa unremarkable NECK:  No jugular venous distention, waveform within normal limits, carotid upstroke brisk and symmetric, no bruits, no thyromegaly LYMPHATICS:  No cervical, inguinal adenopathy LUNGS:  Clear to auscultation bilaterally BACK:  No CVA tenderness CHEST:  Unremarkable HEART:  PMI not displaced or sustained,S1 and S2 within normal limits, no S3, no S4, no clicks, no rubs, no murmurs ABD:  Flat, positive bowel sounds normal in frequency in pitch, no bruits, no rebound, no guarding, no midline pulsatile mass, no hepatomegaly, no splenomegaly EXT:  2 plus pulses throughout, no edema, no cyanosis no clubbing SKIN:  No rashes no nodules NEURO:  Cranial nerves II through XII grossly intact, motor grossly intact throughout PSYCH:  Cognitively intact, oriented to person place and time  EKG:  Sinus rhythm, rate 61, axis within normal limits, intervals within normal limits, nonspecific inferolateral T wave changes 11/10/2011  ASSESSMENT AND PLAN

## 2011-11-17 ENCOUNTER — Other Ambulatory Visit: Payer: Self-pay | Admitting: *Deleted

## 2011-11-17 DIAGNOSIS — R002 Palpitations: Secondary | ICD-10-CM

## 2011-11-18 ENCOUNTER — Encounter: Payer: Self-pay | Admitting: Internal Medicine

## 2011-11-18 ENCOUNTER — Ambulatory Visit (INDEPENDENT_AMBULATORY_CARE_PROVIDER_SITE_OTHER): Payer: Medicare Other | Admitting: Internal Medicine

## 2011-11-18 VITALS — BP 133/75 | HR 50 | Ht 67.5 in | Wt 177.1 lb

## 2011-11-18 DIAGNOSIS — R002 Palpitations: Secondary | ICD-10-CM

## 2011-11-18 NOTE — Assessment & Plan Note (Signed)
The patient has palpitations and has documented PVCs PACs and short SVT probably atrial tachycardia. We have reviewed them. I have explained to her the mechanisms and the benign nature of them particularly in the context of her normal left ventricular function and nonischemic Myoview. She has been reassured. Hopefully this will suffice. She will  Continue to take her metoprolol as it is and that seems to be a reasonable balance of symptoms and bradycardia

## 2011-11-18 NOTE — Progress Notes (Signed)
History and Physical  Patient ID: RICCI PAFF MRN: [id number], SOB: [birth year] 71 y.o. Date of Encounter: 11/18/2011, 9:58 AM  Primary Physician: Donzetta Sprung, MD, MD Primary Cardiologist: [facility] Primary Electrophysiologist:  SK  Chief Complaint: palpitations  History of Present Illness: Hailey [name] is a 71 y.o. female  Seen for palpitations  Which have been not evidently assoc with rhythm disturbances she has worn a variety of different types of monitors in the past She's been told is "not her heart" She does not use caffeine or other stimulants. These occur mostly when she is quiet. She describes 2 syndromes, the first is a flutter that last 5 seconds or so the second is a "jolt"  2013 She had a stress test which demonstrated no evidence of ischemia or infarct. She had echocardiogram which demonstrated a well preserved ejection fraction. There were no significant valvular abnormalities    Past Medical History  Diagnosis Date  . Meniere's disease   . Palpitations   . HLD (hyperlipidemia)      Past Surgical History  Procedure Date  . Appendectomy   . Cardiac catheterization 2005     cone  . Total abdominal hysterectomy       Current Outpatient Prescriptions  Medication Sig Dispense Refill  . Calcium Carbonate-Vit D-Min 600-400 MG-UNIT TABS Take by mouth 2 (two) times daily.        . metoprolol tartrate (LOPRESSOR) 25 MG tablet Take 25 mg by mouth 3 (three) times daily.       . Multiple Vitamins-Minerals (CENTRUM SILVER) tablet Take 1 tablet by mouth daily.        . Nutritional Supplements (ESTROVEN MAXIMUM STRENGTH) TABS Take by mouth daily.        . Omeprazole (PRILOSEC PO) Take by mouth. 1 tab twice a day         Allergies: No Known Allergies   History  Substance Use Topics  . Smoking status: Never Smoker   . Smokeless tobacco: Not on file  . Alcohol Use: No      Family History  Problem Relation Age of Onset  . Coronary artery disease Father     mid  81s      ROS:  Please see the history of present illness.     All other systems reviewed and negative.   Vital Signs: Blood pressure 133/75, pulse 50, height 5' 7.5" (1.715 m), weight 177 lb 1.9 oz (80.341 kg).  PHYSICAL EXAM: General:  Well nourished, well developed female in no acute distress HEENT: normal Lymph: no adenopathy Neck: no JVD Endocrine:  No thryomegaly Vascular: No carotid bruits; FA pulses 2+ bilaterally without bruits Cardiac:  normal S1, S2; RRR; no murmur Back: without kyphosis/scoliosis, no CVA tenderness Lungs:  clear to auscultation bilaterally, no wheezing, rhonchi or rales Abd: soft, nontender, no hepatomegaly Ext: no edema Musculoskeletal:  No deformities, BUE and BLE strength normal and equal Skin: warm and dry Neuro:  CNs 2-12 intact, no focal abnormalities noted Psych:  Normal affect   EKG:  Sinus rhythm at 50 beats per minute with normal intervals Holter monitor reviewed from last month with rare PVCs and in frequent PACs with occasional superventricular runs of 5-20 beats Labs:   Lab Results  Component Value Date   WBC 6.8 09/29/2009   HGB 13.4 09/29/2009   HCT 38.5 09/29/2009   MCV 97.0 09/29/2009   PLT 219 09/29/2009   No results found for this basename: NA,K,CL,CO2,BUN,CREATININE,CALCIUM,LABALBU,PROT,BILITOT,ALKPHOS,ALT,AST,GLUCOSE in the last 168 hours No  results found for this basename: CKTOTAL:4,CKMB:4,TROPONINI:4 in the last 72 hours No results found for this basename: CHOL, HDL, LDLCALC, TRIG   No results found for this basename: DDIMER   BNP No results found for this basename: probnp       ASSESSMENT AND PLAN:

## 2011-11-21 ENCOUNTER — Institutional Professional Consult (permissible substitution): Payer: Medicare Other | Admitting: Internal Medicine

## 2011-12-19 ENCOUNTER — Other Ambulatory Visit: Payer: Self-pay | Admitting: Dermatology

## 2012-04-08 DIAGNOSIS — R079 Chest pain, unspecified: Secondary | ICD-10-CM

## 2012-04-09 ENCOUNTER — Other Ambulatory Visit: Payer: Self-pay | Admitting: Physician Assistant

## 2012-04-09 ENCOUNTER — Encounter (HOSPITAL_COMMUNITY): Payer: Self-pay | Admitting: Pharmacy Technician

## 2012-04-09 ENCOUNTER — Encounter: Payer: Self-pay | Admitting: Cardiology

## 2012-04-09 DIAGNOSIS — I200 Unstable angina: Secondary | ICD-10-CM

## 2012-04-09 DIAGNOSIS — R079 Chest pain, unspecified: Secondary | ICD-10-CM

## 2012-04-09 NOTE — Progress Notes (Signed)
NAME:  [name], [name] ROOM: 209  UNIT NUMBER:  [id number] LOCATION: 2F 209 01 ADM/VISIT DATE:  04/08/2012   ADM PHYS:  ACCT:  [id number] DOB: 11/13/1940   REQUESTING PHYSICIAN:  Terry Daniel, M.D.  PRIMARY CARDIOLOGIST:  James Hochrein, M.D.  REASON FOR CONSULTATION:  Chest pain.  HISTORY OF PRESENT ILLNESS:  Ms. Hailey [name] is a 71-year-old woman with a history of hypertension, palpitations with prior documentation of PCVs and brief SVT and nonobstructive coronary artery disease documented with cardiac catheterization in 2005.  Most recently, she had a follow up Cardiolite study done in March of this year that demonstrated no definite ischemia and has been managed medically.  She has however continued to manifest episodes of chest pain, reporting it to be worse recently.  She describes a tightness in the left side of her chest that radiates into her shoulder and arm, associated with "tingling" and has been experiencing these symptoms in the early morning hours, wakes up around 4:00 a.m. at least on 2 occasions.  She has had no specific palpitations with these symptoms.  She was admitted to [facility] and treated with nitroglycerin which reportedly lead to improvement in her pain.  She has been assessed by Dr. Daniel and cardiac markers were obtained, completely normal with peak troponin less than 0.01.  Her ECG is abnormal showing low voltage with sinus rhythm and anterolateral T wave inversions, although these are old based on prior tracings.  She had an elevated D-dimer of 0.80 and was referred for a CT angiogram of the chest which reported no pulmonary embolus or obvious acute disease.  Cardiomegaly was described.  No pericardial effusion described.  Dr. Daniel has requested that this patient be transferred for a cardiac catheterization and we are consulted to assist with arranging this.  ALLERGIES:  No known drug allergies.  CURRENT MEDICATIONS: 1. Pantoprazole 40 mg p.o. b.i.d. 2. Lopressor  25 mg p.o. t.i.d. 3. Aspirin 81 mg p.o. every day. 4. Nitroglycerin paste 1/2 inch q. 6 hours. 5. Acetaminophen 650 mg p.o. q. 6 hours p.r.n.  PAST MEDICAL HISTORY:  Reviewed above.  Additional problems include gastroesophageal reflux disease and hiatal hernia.  PAST SURGICAL HISTORY:  She is status post cholecystectomy, appendectomy, hysterectomy and hernia repair.  SOCIAL HISTORY:  The patient denies any tobacco or alcohol use.  FAMILY HISTORY:  Reviewed.  She has a sister with a history of mitral valve prolapse, brother with a history of myocardial infarction at age 61 and her father also had heart disease and history of stroke and died in his late 70s.  Mother with a history of hypertension.  REVIEW OF SYSTEMS:  As outlined above.  She reports no clearly reproducible exertional chest pain.  She does have occasional palpitations but no syncope. She reports no major bleeding episodes.  No cough or hemoptysis.  No orthopnea or PND.  Otherwise, negative.  PHYSICAL EXAMINATION:  Vitals:  Temperature 97.7 degrees, heart rate 61, respirations 20, oxygen saturation 98% on room air and blood pressure 127/77.   General:  This is a pleasant, normally nourished appearing elderly woman in no acute distress.  She is still complaining of mild left-sided chest pain.  HEENT:  Conjunctivae and lids are normal.  Oropharynx is clear.  Neck:  Supple.  No JVD or carotid bruits.  Lungs:  Clear without labored breathing at rest.  Cardiac:  Regular rate and rhythm without rub or gallop.  Abdomen:  Soft and nontender.  Bowel sounds are present.    Extremities: Exhibit no pitting edema.  Distal pulses are 2+.  Skin: Warm and dry.  Musculoskeletal:  No kyphosis noted.  Neuro/psych:  The patient is alert and oriented x3.  Affect is appropriate.  LABORATORY AND RADIOLOGICAL DATA:  Glucose 114, BUN 30, creatinine 1.1, sodium 140, potassium 4.2, chloride 104, bicarb 27.  Total CK 75.  Peak CK/MB 1.0.  Troponin I less than 0.01.   INR 0.9.  D-dimer is 0.80.  WBC 5100, hemoglobin 13.7, hematocrit 40.5 and platelets 243,000.  Chest x-ray from 04/08/2012 reported upper normal heart size, no other acute findings.  IMPRESSION: 1. Recurrent chest pain, some features are suggestive of angina, however her cardiac markers are normal and electrocardiogram shows chronic anterolateral T wave abnormalities.  Previous workup includes documentation of nonobstructive coronary artery disease by catheterization in 2005.  She had a low risk Cardiolite scan done in March of this year. Dr. Daniel has assessed the patient is requesting that she undergo cardiac catheterization and has consulted us to assist with arranging this.  [name] after reviewing the risks and benefits. 2. Hypertension. 3. History of palpitations with documentation of atrial and ventricular ectopy as well as brief episodes of supraventricular tachycardia.  No clear correlation of palpitations with chest pain symptoms. 4. Gastroesophageal reflux disease with hiatal hernia.  Question whether some of her symptoms could be GI in origin, may need further assessment if her coronary angiography is reassuring.  RECOMMENDATIONS:  The situation was reviewed with the patient and her sister.  She is very much inclined to pursue a cardiac catheterization for definitive reassessment of coronary anatomy, specifically to exclude any progression of CAD that might be an explanation for her chest pain symptoms.  Dr. Daniel has also requested cardiac catheterization be performed.  She has ruled out for myocardial infarction, and was initially planning to remain at [facility] for transfer at [place] on Monday, however hospitalization was declined by her insurance carrier. After reviewing this with Dr. Daniel and the patient, plan is for her to be discharged home over the weekend, and arrange an outpatient cardiac catheterization at [place] [facility] on Monday.  Orders have been written.   __________________________    SAMUEL MCDOWELL, M.D. /landm D: 04/09/2012 1159 T: 04/09/2012 1339 P: MCD1  cc:  James Hochrein, M.D. TERRY DANIEL, M.D.   

## 2012-04-12 ENCOUNTER — Encounter (HOSPITAL_COMMUNITY)
Admission: RE | Disposition: A | Discharge status: Home / Self Care | Payer: Self-pay | Source: Ambulatory Visit | Attending: Cardiovascular Disease

## 2012-04-12 ENCOUNTER — Ambulatory Visit (HOSPITAL_COMMUNITY)
Admission: RE | Admit: 2012-04-12 | Discharge: 2012-04-12 | Disposition: A | Discharge status: Home / Self Care | Payer: Medicare Other | Source: Ambulatory Visit | Attending: Cardiovascular Disease | Admitting: Cardiovascular Disease

## 2012-04-12 DIAGNOSIS — R079 Chest pain, unspecified: Secondary | ICD-10-CM

## 2012-04-12 DIAGNOSIS — I200 Unstable angina: Secondary | ICD-10-CM

## 2012-04-12 HISTORY — PX: LEFT HEART CATHETERIZATION WITH CORONARY ANGIOGRAM: SHX5451

## 2012-04-12 LAB — CBC
Hemoglobin: 14.1 g/dL (ref 12.0–15.0)
MCH: 32.4 pg (ref 26.0–34.0)
MCV: 97.7 fL (ref 78.0–100.0)
RBC: 4.35 MIL/uL (ref 3.87–5.11)

## 2012-04-12 LAB — BASIC METABOLIC PANEL
BUN: 27 mg/dL — ABNORMAL HIGH (ref 6–23)
Chloride: 102 mEq/L (ref 96–112)
GFR calc Af Amer: 58 mL/min — ABNORMAL LOW (ref >90)
Potassium: 4.1 mEq/L (ref 3.5–5.1)

## 2012-04-12 LAB — PROTIME-INR: Prothrombin Time: 13.3 seconds (ref 11.6–15.2)

## 2012-04-12 SURGERY — LEFT HEART CATHETERIZATION WITH CORONARY ANGIOGRAM
Anesthesia: LOCAL

## 2012-04-12 MED ORDER — FENTANYL CITRATE 0.05 MG/ML IJ SOLN
INTRAMUSCULAR | Status: AC
  Filled 2012-04-12: qty 2

## 2012-04-12 MED ORDER — ACETAMINOPHEN 325 MG PO TABS: 650.0000 mg | ORAL_TABLET | ORAL | Status: DC | PRN

## 2012-04-12 MED ORDER — SODIUM CHLORIDE 0.9 % IV SOLN
INTRAVENOUS | Status: DC
  Administered 2012-04-12: 08:00:00 via INTRAVENOUS

## 2012-04-12 MED ORDER — SODIUM CHLORIDE 0.9 % IV SOLN: 1.0000 mL/kg/h | INTRAVENOUS | Status: DC

## 2012-04-12 MED ORDER — ASPIRIN 81 MG PO CHEW
324.0000 mg | CHEWABLE_TABLET | ORAL | Status: AC
  Administered 2012-04-12: 324 mg via ORAL
  Filled 2012-04-12: qty 4

## 2012-04-12 MED ORDER — HEPARIN SODIUM (PORCINE) 1000 UNIT/ML IJ SOLN
INTRAMUSCULAR | Status: AC
  Filled 2012-04-12: qty 1

## 2012-04-12 MED ORDER — NITROGLYCERIN 0.2 MG/ML ON CALL CATH LAB
INTRAVENOUS | Status: AC
  Filled 2012-04-12: qty 1

## 2012-04-12 MED ORDER — HEPARIN (PORCINE) IN NACL 2-0.9 UNIT/ML-% IJ SOLN
INTRAMUSCULAR | Status: AC
  Filled 2012-04-12: qty 2000

## 2012-04-12 MED ORDER — VERAPAMIL HCL 2.5 MG/ML IV SOLN
INTRAVENOUS | Status: AC
  Filled 2012-04-12: qty 2

## 2012-04-12 MED ORDER — LIDOCAINE HCL (PF) 1 % IJ SOLN
INTRAMUSCULAR | Status: AC
  Filled 2012-04-12: qty 30

## 2012-04-12 MED ORDER — MIDAZOLAM HCL 2 MG/2ML IJ SOLN
INTRAMUSCULAR | Status: AC
  Filled 2012-04-12: qty 2

## 2012-04-12 NOTE — Interval H&P Note (Signed)
History and Physical Interval Note:  04/12/2012 10:04 AM  Hailey [name]  has presented today for surgery, with the diagnosis of cad  The various methods of treatment have been discussed with the patient and family. After consideration of risks, benefits and other options for treatment, the patient has consented to  Procedure(s) (LRB) with comments: LEFT HEART CATHETERIZATION WITH CORONARY ANGIOGRAM (N/A) as a surgical intervention .  The patient's history has been reviewed, patient examined, no change in status, stable for surgery.  I have reviewed the patient's chart and labs.  Questions were answered to the patient's satisfaction.     Theron Arista [facility] 04/12/2012 10:04 AM

## 2012-04-12 NOTE — CV Procedure (Signed)
   Cardiac Catheterization Procedure Note  Name: Hailey [name] MRN: [id number] DOB: [birth year]  Procedure: Left Heart Cath, Selective Coronary Angiography, LV angiography  Indication: 71 year old female who presents with symptoms of refractory chest pain.   Procedural Details: The right wrist was prepped, draped, and anesthetized with 1% lidocaine. Using the modified Seldinger technique, a 5 French sheath was introduced into the right radial artery. 3 mg of verapamil was administered through the sheath, weight-based unfractionated heparin was administered intravenously. Standard Judkins catheters were used for selective coronary angiography and left ventriculography. Catheter exchanges were performed over an exchange length guidewire. There were no immediate procedural complications. A TR band was used for radial hemostasis at the completion of the procedure.  The patient was transferred to the post catheterization recovery area for further monitoring.  Procedural Findings: Hemodynamics: AO 150/72 with a mean of 106 mmHg LV 144/15 mmHg  Coronary angiography: Coronary dominance: right  Left mainstem: Normal.  Left anterior descending (LAD): Very mild irregularities in the mid vessel less than 10-20%.  Left circumflex (LCx): 20% at the origin. Otherwise no significant disease.  There is a ramus intermediate branch that is modest in size and is normal.  Right coronary artery (RCA): Normal.  Left ventriculography: Left ventricular systolic function is normal, LVEF is estimated at 55-65%, there is no significant mitral regurgitation   Final Conclusions:   1. Very minor nonobstructive coronary disease. 2. Normal left ventricular function.  Recommendations: Continue medical management. Assess for noncardiac causes of chest pain.  Theron Arista [facility] 04/12/2012, 10:26 AM

## 2012-04-12 NOTE — H&P (View-Only) (Signed)
NAME:  Hailey [name], Hailey [name] ROOM: 209  UNIT NUMBER:  [id number] LOCATION: 35F 209 01 ADM/VISIT DATE:  04/08/2012   ADM Vaughan BrownerKathaleen Grinder:  [id number] DOB: 06-Dec-1940   REQUESTING PHYSICIAN:  Donzetta Sprung, M.D.  PRIMARY CARDIOLOGIST:  Rollene Rotunda, M.D.  REASON FOR CONSULTATION:  Chest pain.  HISTORY OF PRESENT ILLNESS:  Hailey [name] is a 71 year old woman with a history of hypertension, palpitations with prior documentation of PCVs and brief SVT and nonobstructive coronary artery disease documented with cardiac catheterization in 2005.  Most recently, she had a follow up Cardiolite study done in March of this year that demonstrated no definite ischemia and has been managed medically.  She has however continued to manifest episodes of chest pain, reporting it to be worse recently.  She describes a tightness in the left side of her chest that radiates into her shoulder and arm, associated with "tingling" and has been experiencing these symptoms in the early morning hours, wakes up around 4:00 a.m. at least on 2 occasions.  She has had no specific palpitations with these symptoms.  She was admitted to [facility] and treated with nitroglycerin which reportedly lead to improvement in her pain.  She has been assessed by Dr. Reuel Boom and cardiac markers were obtained, completely normal with peak troponin less than 0.01.  Her ECG is abnormal showing low voltage with sinus rhythm and anterolateral T wave inversions, although these are old based on prior tracings.  She had an elevated D-dimer of 0.80 and was referred for a CT angiogram of the chest which reported no pulmonary embolus or obvious acute disease.  Cardiomegaly was described.  No pericardial effusion described.  Dr. Reuel Boom has requested that this patient be transferred for a cardiac catheterization and we are consulted to assist with arranging this.  ALLERGIES:  No known drug allergies.  CURRENT MEDICATIONS: 1. Pantoprazole 40 mg p.o. b.i.d. 2. Lopressor  25 mg p.o. t.i.d. 3. Aspirin 81 mg p.o. every day. 4. Nitroglycerin paste 1/2 inch q. 6 hours. 5. Acetaminophen 650 mg p.o. q. 6 hours p.r.n.  PAST MEDICAL HISTORY:  Reviewed above.  Additional problems include gastroesophageal reflux disease and hiatal hernia.  PAST SURGICAL HISTORY:  She is status post cholecystectomy, appendectomy, hysterectomy and hernia repair.  SOCIAL HISTORY:  The patient denies any tobacco or alcohol use.  FAMILY HISTORY:  Reviewed.  She has a sister with a history of mitral valve prolapse, brother with a history of myocardial infarction at age 42 and her father also had heart disease and history of stroke and died in his late 43s.  Mother with a history of hypertension.  REVIEW OF SYSTEMS:  As outlined above.  She reports no clearly reproducible exertional chest pain.  She does have occasional palpitations but no syncope. She reports no major bleeding episodes.  No cough or hemoptysis.  No orthopnea or PND.  Otherwise, negative.  PHYSICAL EXAMINATION:  Vitals:  Temperature 97.7 degrees, heart rate 61, respirations 20, oxygen saturation 98% on room air and blood pressure 127/77.   General:  This is a pleasant, normally nourished appearing elderly woman in no acute distress.  She is still complaining of mild left-sided chest pain.  HEENT:  Conjunctivae and lids are normal.  Oropharynx is clear.  Neck:  Supple.  No JVD or carotid bruits.  Lungs:  Clear without labored breathing at rest.  Cardiac:  Regular rate and rhythm without rub or gallop.  Abdomen:  Soft and nontender.  Bowel sounds are present.  Extremities: Exhibit no pitting edema.  Distal pulses are 2+.  Skin: Warm and dry.  Musculoskeletal:  No kyphosis noted.  Neuro/psych:  The patient is alert and oriented x3.  Affect is appropriate.  LABORATORY AND RADIOLOGICAL DATA:  Glucose 114, BUN 30, creatinine 1.1, sodium 140, potassium 4.2, chloride 104, bicarb 27.  Total CK 75.  Peak CK/MB 1.0.  Troponin I less than 0.01.   INR 0.9.  D-dimer is 0.80.  WBC 5100, hemoglobin 13.7, hematocrit 40.5 and platelets 243,000.  Chest x-ray from 04/08/2012 reported upper normal heart size, no other acute findings.  IMPRESSION: 1. Recurrent chest pain, some features are suggestive of angina, however her cardiac markers are normal and electrocardiogram shows chronic anterolateral T wave abnormalities.  Previous workup includes documentation of nonobstructive coronary artery disease by catheterization in 2005.  She had a low risk Cardiolite scan done in March of this year. Dr. Reuel Boom has assessed the patient is requesting that she undergo cardiac catheterization and has consulted [place] to assist with arranging this.  [name] after reviewing the risks and benefits. 2. Hypertension. 3. History of palpitations with documentation of atrial and ventricular ectopy as well as brief episodes of supraventricular tachycardia.  No clear correlation of palpitations with chest pain symptoms. 4. Gastroesophageal reflux disease with hiatal hernia.  Question whether some of her symptoms could be GI in origin, may need further assessment if her coronary angiography is reassuring.  RECOMMENDATIONS:  The situation was reviewed with the patient and her sister.  She is very much inclined to pursue a cardiac catheterization for definitive reassessment of coronary anatomy, specifically to exclude any progression of CAD that might be an explanation for her chest pain symptoms.  Dr. Reuel Boom has also requested cardiac catheterization be performed.  She has ruled out for myocardial infarction, and was initially planning to remain at [facility] for transfer at [facility] on Monday, however hospitalization was declined by her insurance carrier. After reviewing this with Dr. Reuel Boom and the patient, plan is for her to be discharged home over the weekend, and arrange an outpatient cardiac catheterization at [facility] on Monday.  Orders have been written.   __________________________    Nona Dell, M.D. Alinda Money D: 04/09/2012 1159 T: 04/09/2012 1339 P: [id number]  cc:  Rollene Rotunda, M.D. Donzetta Sprung, M.D.

## 2012-08-18 ENCOUNTER — Other Ambulatory Visit (HOSPITAL_COMMUNITY): Payer: Self-pay | Admitting: Family Medicine

## 2012-08-18 DIAGNOSIS — Z139 Encounter for screening, unspecified: Secondary | ICD-10-CM

## 2012-09-13 ENCOUNTER — Ambulatory Visit (HOSPITAL_COMMUNITY)
Admission: RE | Admit: 2012-09-13 | Discharge: 2012-09-13 | Disposition: A | Discharge status: Home / Self Care | Payer: Medicare PPO | Source: Ambulatory Visit | Attending: Family Medicine | Admitting: Family Medicine

## 2012-09-13 DIAGNOSIS — Z139 Encounter for screening, unspecified: Secondary | ICD-10-CM

## 2012-09-13 DIAGNOSIS — Z1231 Encounter for screening mammogram for malignant neoplasm of breast: Secondary | ICD-10-CM | POA: Insufficient documentation

## 2012-12-21 ENCOUNTER — Encounter: Payer: Self-pay | Admitting: *Deleted

## 2012-12-22 ENCOUNTER — Ambulatory Visit (INDEPENDENT_AMBULATORY_CARE_PROVIDER_SITE_OTHER): Payer: Medicare PPO | Admitting: Adult Health

## 2012-12-22 ENCOUNTER — Encounter: Payer: Self-pay | Admitting: Adult Health

## 2012-12-22 VITALS — BP 130/76 | HR 70 | Ht 67.0 in | Wt 186.0 lb

## 2012-12-22 DIAGNOSIS — Z1212 Encounter for screening for malignant neoplasm of rectum: Secondary | ICD-10-CM

## 2012-12-22 DIAGNOSIS — Z01419 Encounter for gynecological examination (general) (routine) without abnormal findings: Secondary | ICD-10-CM

## 2012-12-22 DIAGNOSIS — Z1389 Encounter for screening for other disorder: Secondary | ICD-10-CM

## 2012-12-22 DIAGNOSIS — I10 Essential (primary) hypertension: Secondary | ICD-10-CM

## 2012-12-22 DIAGNOSIS — N6310 Unspecified lump in the right breast, unspecified quadrant: Secondary | ICD-10-CM | POA: Insufficient documentation

## 2012-12-22 LAB — POCT URINALYSIS DIPSTICK: Glucose, UA: NEGATIVE

## 2012-12-22 LAB — HEMOCCULT GUIAC POC 1CARD (OFFICE): Fecal Occult Blood, POC: NEGATIVE

## 2012-12-22 NOTE — Assessment & Plan Note (Signed)
Will get diagnostic mammogram and rt [place]

## 2012-12-22 NOTE — Patient Instructions (Addendum)
Right diagnostic mammogram and rt breast US 5/28 at 9 am be there at 8:45 Labs at [facility] Colonoscopy 2022 Mammograms yearly or as ordered Physical in 1-2 years

## 2012-12-22 NOTE — Progress Notes (Signed)
Patient ID: ADONICA FUKUSHIMA, female   DOB: [birth year], 72 y.o.   MRN: [id number] History of Present Illness: Julissa is a 72 year old white female in for her gyn physical.  Current Medications, Allergies, Past Medical History, Past Surgical History, Family History and Social History were reviewed in [employer] medical record.    Review of Systems: Patient denies any headaches, blurred vision, shortness of breath, chest pain, abdominal pain, problems with bowel movements, urination. She is not having sex, her moods are good, but she has joint aches in knees and hips.  Physical Exam:Blood pressure 130/76, pulse 70, height 5\' 7"  (1.702 m), weight 186 lb (84.369 kg).Urine dipstick was negative. General:  Well developed, well nourished, no acute distress Skin:  Warm and dry Neck:  Midline trachea, normal thyroid, no carotid bruits heard. Lungs; Clear to auscultation bilaterally Breast:  No dominant palpable mass, retraction, or nipple discharge on the left, on the right there is a thickness at 11 0'clock that is tender, no retraction or nipple discharge, she said she has noticed this area in the shower before. Cardiovascular: Regular rate and rhythm Abdomen:  Soft, non tender, no hepatosplenomegaly Pelvic:  External genitalia is normal in appearance for age.  The vagina is atrophic. The cervix and uterus are absent.  No adnexal masses or tenderness noted. Rectal: Good sphincter tone, no polyps, or hemorrhoids felt.  Hemoccult negative. Extremities:  No swelling  noted Psych:  Alert and cooperative, seems happy  Impression: Yearly gyn exam no pap Right breast thickness Hypertension  Plan: Get right diagnostic mammogram and right breast US 5/28 at 9 am at [facility] Physical in 1- 2 years Labs at [facility] Colonoscopy 2022 Mammogram yearly or as ordered

## 2012-12-29 ENCOUNTER — Ambulatory Visit (HOSPITAL_COMMUNITY)
Admission: RE | Admit: 2012-12-29 | Discharge: 2012-12-29 | Disposition: A | Discharge status: Home / Self Care | Payer: Medicare PPO | Source: Ambulatory Visit | Attending: Adult Health | Admitting: Adult Health

## 2012-12-29 DIAGNOSIS — N6310 Unspecified lump in the right breast, unspecified quadrant: Secondary | ICD-10-CM

## 2012-12-29 DIAGNOSIS — N6459 Other signs and symptoms in breast: Secondary | ICD-10-CM | POA: Insufficient documentation

## 2012-12-29 DIAGNOSIS — N644 Mastodynia: Secondary | ICD-10-CM | POA: Insufficient documentation

## 2013-06-06 ENCOUNTER — Encounter (INDEPENDENT_AMBULATORY_CARE_PROVIDER_SITE_OTHER): Payer: Self-pay

## 2013-06-06 ENCOUNTER — Encounter: Payer: Self-pay | Admitting: Adult Health

## 2013-06-06 ENCOUNTER — Ambulatory Visit (INDEPENDENT_AMBULATORY_CARE_PROVIDER_SITE_OTHER): Payer: Medicare PPO | Admitting: Adult Health

## 2013-06-06 VITALS — BP 136/70 | Ht 68.0 in | Wt 188.0 lb

## 2013-06-06 DIAGNOSIS — N63 Unspecified lump in unspecified breast: Secondary | ICD-10-CM

## 2013-06-06 DIAGNOSIS — N6310 Unspecified lump in the right breast, unspecified quadrant: Secondary | ICD-10-CM

## 2013-06-06 DIAGNOSIS — R928 Other abnormal and inconclusive findings on diagnostic imaging of breast: Secondary | ICD-10-CM

## 2013-06-06 NOTE — Patient Instructions (Signed)
Breast Cyst A breast cyst is a sac in your breast that is filled with fluid. This is common in women. Women can have one or many cysts. When the breasts contain many cysts, it is usually due to a noncancerous (benign) condition called fibrocystic change. These lumps form under the influence of female hormones (estrogen and progesterone). The lumps are most often located in the upper, outer portion of the breast. They are often more swollen, painful, and tender before your period starts. They usually disappear after menopause, unless you are on hormone therapy. Different types of cysts:  Macrocysts. These are cysts that are about 2 inches (5.1 cm) in diameter.  Microcysts. These are tiny cysts that you cannot feel, but that are seen with a mammogram or an ultrasound.  Galactocele. This is a cyst containing milk, which develops when and if you suddenly stop breastfeeding.  Sebaceous cyst of the skin (not in the breast tissue itself). These are not cancerous. Breast cysts do not increase your chance of getting breast cancer. However, they must be followed and treated closely, because a cyst can be cancerous. Be sure to see your caregiver for follow-up care as recommended.  CAUSES   It is not completely known what causes a breast cyst.  Estrogen may influence the development of a breast cyst.  An overgrowth of milk glands and connective tissue in the breast can block the milk glands, causing them to fill with fluid.  Scar tissue in the breast from previous surgery may block the glands, causing a cyst. SYMPTOMS   Feeling a smooth, round, soft lump (like a grape) in the breast that is easily moveable.  Breast discomfort or pain, especially in the area of the cyst.  Increase in size of the lump before your menstrual period, and decrease in size after your menstrual period. DIAGNOSIS   The cyst can be felt during an exam by your caregiver.  Mammogram (breast X-ray).  Ultrasound.  Removing  fluid from the cyst with a needle (fine needle aspiration). TREATMENT   Your caregiver may feel there is no reason for treatment. He or she may watch to see if it goes away on its own.  Hormone treatment.  Needle aspiration. There is a 40% chance of the cyst recurring after aspiration.  Surgery to remove the whole cyst. HOME CARE INSTRUCTIONS   Get a yearly exam by your caregiver.  Practice "breast self-awareness." This means understanding the normal appearance and feel of your breasts and may include breast self-examination.  Have a clinical breast exam (CBE) by a caregiver every 1 to 3 years if you are 20 to 72 years of age. After age 40, you should have a CBE every year.  Get mammogram tests as directed by your caregiver.  Only take over-the-counter pain medicine as directed by your caregiver.  Wear a good support bra, especially when exercising.  Avoid caffeine.  Reduce your salt intake, especially before your menstrual period. Too much salt can cause fluid retention, breast swelling, and discomfort. SEEK MEDICAL CARE IF:   You feel, or think you feel, a lump in your breast.  You notice that both breasts look different than usual.  You notice that both breasts feel different than before.  Your breast is still causing pain, after your menstrual period is over.  You need medicine for breast pain and swelling that occurs with your menstrual period. SEEK IMMEDIATE MEDICAL CARE IF:   You develop severe pain, tenderness, redness, or warmth in your   breast.  You develop nipple discharge or bleeding.  Your breast lump becomes hard and painful.  You find new lumps or bumps that were not there before.  You feel lumps in your armpit (axilla).  You notice dimpling or wrinkling of the breast or nipple.  You have a fever. Document Released: 07/21/2005 Document Revised: 10/13/2011 Document Reviewed: 11/10/2008 [facility] Patient Information 2014 [place], [place]. Get mammogram  and [place] 11/12 at 2:30 pm be there at 2:15pm follow up prn

## 2013-06-06 NOTE — Assessment & Plan Note (Signed)
Has mammogram in May ?fatty tissue now mobile nodule at 11 oclock and is tender will re check with mammo and [place]

## 2013-06-06 NOTE — Progress Notes (Signed)
Subjective:     Patient ID: Hailey [name], female   DOB: 1940/08/14, 72 y.o.   MRN: [id number]  HPI Hailey [name] is a 72 year old white female in complaining of tenderness in right breast.Had pain before and had negative mammogram in May,?fatty tissue.  Review of Systems See HPI Reviewed past medical,surgical, social and family history. Reviewed medications and allergies.     Objective:   Physical Exam BP 136/70  Ht 5\' 8"  (1.727 m)  Wt 188 lb (85.276 kg)  BMI 28.59 kg/m2   Skin warm and dry,  Breasts:no dominate palpable mass, retraction or nipple discharge on left on right no retraction or nipple discharge but has mobile nodule and tenderness at 11 o'clock 4 finger breaths from nipple, pt felt    Assessment:     Right breast nodule with tenderness    Plan:     Diagnostic right mammogram and [place] 11/12 at 2:30 pm at [facility] Follow up prn

## 2013-06-15 ENCOUNTER — Encounter (HOSPITAL_COMMUNITY): Payer: Medicare PPO

## 2013-06-15 ENCOUNTER — Ambulatory Visit (HOSPITAL_COMMUNITY)
Admission: RE | Admit: 2013-06-15 | Discharge: 2013-06-15 | Disposition: A | Discharge status: Home / Self Care | Payer: Medicare PPO | Source: Ambulatory Visit | Attending: Adult Health | Admitting: Adult Health

## 2013-06-15 DIAGNOSIS — N644 Mastodynia: Secondary | ICD-10-CM | POA: Insufficient documentation

## 2013-06-15 DIAGNOSIS — N63 Unspecified lump in unspecified breast: Secondary | ICD-10-CM

## 2013-08-09 ENCOUNTER — Other Ambulatory Visit: Payer: Self-pay | Admitting: Obstetrics and Gynecology

## 2013-08-09 DIAGNOSIS — Z139 Encounter for screening, unspecified: Secondary | ICD-10-CM

## 2013-08-19 ENCOUNTER — Ambulatory Visit (INDEPENDENT_AMBULATORY_CARE_PROVIDER_SITE_OTHER): Payer: Medicare PPO | Admitting: Adult Health

## 2013-08-19 ENCOUNTER — Encounter (INDEPENDENT_AMBULATORY_CARE_PROVIDER_SITE_OTHER): Payer: Self-pay

## 2013-08-19 ENCOUNTER — Encounter: Payer: Self-pay | Admitting: Adult Health

## 2013-08-19 VITALS — BP 140/70 | Ht 68.0 in | Wt 190.5 lb

## 2013-08-19 DIAGNOSIS — N644 Mastodynia: Secondary | ICD-10-CM

## 2013-08-19 NOTE — Progress Notes (Signed)
Subjective:     Patient ID: Hailey [name], female   DOB: 09/14/1940, 73 y.o.   MRN: [id number]  HPI [name] is a 73 year old white female in to discuss US and mammogram done in November and Mammogram due in February. The US and mammogram showed fatty tissue no masses, still has pain on right at times.will get mammogram as scheduled and stop wearing under wire.She does not drink caffeine.  Review of Systems See HPI Reviewed past medical,surgical, social and family history. Reviewed medications and allergies.     Objective:   Physical Exam BP 140/70  Ht 5\' 8"  (1.727 m)  Wt 190 lb 8 oz (86.41 kg)  BMI 28.97 kg/m2   talk only see HPI  Assessment:     History of right breast pain    Plan:     Do not wear under wire, except special occasions   Get mammogram in February Follow up in 5 months for physical

## 2013-08-19 NOTE — Patient Instructions (Signed)
Do not wear under wire except special occasions Get mammogram February  Return in 5 months for physical.

## 2013-09-15 ENCOUNTER — Ambulatory Visit (HOSPITAL_COMMUNITY)
Admission: RE | Admit: 2013-09-15 | Discharge: 2013-09-15 | Disposition: A | Discharge status: Home / Self Care | Payer: Medicare PPO | Source: Ambulatory Visit | Attending: Obstetrics and Gynecology | Admitting: Obstetrics and Gynecology

## 2013-09-15 ENCOUNTER — Other Ambulatory Visit: Payer: Self-pay | Admitting: Obstetrics and Gynecology

## 2013-09-15 DIAGNOSIS — Z1231 Encounter for screening mammogram for malignant neoplasm of breast: Secondary | ICD-10-CM | POA: Insufficient documentation

## 2013-10-21 ENCOUNTER — Encounter: Payer: Self-pay | Admitting: Diagnostic Neuroimaging

## 2013-10-21 ENCOUNTER — Ambulatory Visit (INDEPENDENT_AMBULATORY_CARE_PROVIDER_SITE_OTHER): Payer: Medicare PPO | Admitting: Diagnostic Neuroimaging

## 2013-10-21 VITALS — BP 146/73 | HR 53 | Temp 97.4°F | Ht 68.0 in | Wt 190.0 lb

## 2013-10-21 DIAGNOSIS — R202 Paresthesia of skin: Principal | ICD-10-CM

## 2013-10-21 DIAGNOSIS — R6889 Other general symptoms and signs: Secondary | ICD-10-CM

## 2013-10-21 DIAGNOSIS — R209 Unspecified disturbances of skin sensation: Secondary | ICD-10-CM

## 2013-10-21 DIAGNOSIS — R200 Anesthesia of skin: Secondary | ICD-10-CM

## 2013-10-21 NOTE — Progress Notes (Signed)
[facility]  PATIENT: Hailey [name] DOB: March 30, 1941  REFERRING CLINICIAN: Daniel HISTORY FROM: patient  REASON FOR VISIT: new consult   HISTORICAL  CHIEF COMPLAINT:  Chief Complaint  Patient presents with  . Numbness    HISTORY OF PRESENT ILLNESS:   73 year old female here for evaluation of numbness and tingling. December 2014 patient was a sitting on the sofa when she felt a pressure sensation of the top of her head, followed by electrical "zap" sensations scatted through the bilateral face and radiating to left arm. Symptoms lasted 5 minutes. Symptoms then subsided. Since that time she's had intermittent migratory paresthesias throughout her face, scalp, arms, legs, body. Episodes now last less than a second time. This happens multiple times throughout the day. No triggering or alleviating factors. Symptoms are not painful. She has been little bit more concerned and bothered by these lately.  Patient MR the brain which was unremarkable. No other recent changes in sleep, stress, diet, exercise. 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  REVIEW OF SYSTEMS: Full 14 system review of systems performed and notable only for palpitation min years joint pain runny nose.  ALLERGIES: No Known Allergies  HOME MEDICATIONS: Outpatient Prescriptions Prior to Visit  Medication Sig Dispense Refill  . Calcium Carbonate-Vit D-Min 600-400 MG-UNIT TABS Take by mouth daily.       . metoprolol tartrate (LOPRESSOR) 25 MG tablet Take 25 mg by mouth 3 (three) times daily.       . Multiple Vitamins-Minerals (CENTRUM SILVER) tablet Take 1 tablet by mouth daily.        Marland Kitchen omeprazole (PRILOSEC) 20 MG capsule Take 20 mg by mouth 2 (two) times daily.      Marland Kitchen FLUZONE HIGH-DOSE injection        No facility-administered medications prior to visit.    PAST MEDICAL  HISTORY: Past Medical History  Diagnosis Date  . Meniere's disease   . Palpitations   . HLD (hyperlipidemia)   . H/O hematuria   . Vaginal atrophy   . Hypertension     PAST SURGICAL HISTORY: Past Surgical History  Procedure Laterality Date  . Appendectomy    . Cardiac catheterization  2005     cone  . Total abdominal hysterectomy    . Hernia repair    . Colonoscopy      FAMILY HISTORY: Family History  Problem Relation Age of Onset  . Coronary artery disease Father     mid 82s  . Hypertension Mother   . Stroke Brother   . Hypertension Brother   . Stroke Brother   . Heart disease Brother   . Heart attack Brother     SOCIAL HISTORY:  History   Social History  . Marital Status: Single    Spouse Name: N/A    Number of Children: 0  . Years of Education: College   Occupational History  . retired    Social History Main Topics  . Smoking status: Never Smoker   . Smokeless tobacco: Never Used  . Alcohol Use: Yes     Comment: rarely; glas of wine with diner  . Drug Use: No  . Sexual Activity: No   Other Topics Concern  . Not on file   Social History Narrative   Single. Retired.    Patient lives at home alone.   Caffeine Use: none  PHYSICAL EXAM  Filed Vitals:   10/21/13 1150  BP: 146/73  Pulse: 53  Temp: 97.4 F (36.3 C)  TempSrc: Oral  Height: 5\' 8"  (1.727 m)  Weight: 190 lb (86.183 kg)    Not recorded    Body mass index is 28.9 kg/(m^2).  GENERAL EXAM: Patient is in no distress; well developed, nourished and groomed; neck is supple  CARDIOVASCULAR: Regular rate and rhythm, no murmurs, no carotid bruits  NEUROLOGIC: MENTAL STATUS: awake, alert, oriented to person, place and time, recent and remote memory intact, normal attention and concentration, language fluent, comprehension intact, naming intact, fund of knowledge appropriate CRANIAL NERVE: no papilledema on fundoscopic exam, pupils equal and reactive to light, visual fields full  to confrontation, extraocular muscles intact, no nystagmus, facial sensation and strength symmetric, hearing intact, palate elevates symmetrically, uvula midline, shoulder shrug symmetric, tongue midline. MOTOR: normal bulk and tone, full strength in the BUE, BLE SENSORY: normal and symmetric to light touch, pinprick, temperature, vibration COORDINATION: finger-nose-finger, fine finger movements normal REFLEXES: deep tendon reflexes present and symmetric GAIT/STATION: narrow based gait; able to walk on tandem; romberg is negative    DIAGNOSTIC DATA (LABS, IMAGING, TESTING) - I reviewed patient records, labs, notes, testing and imaging myself where available.  Lab Results  Component Value Date   WBC 5.7 04/12/2012   HGB 14.1 04/12/2012   HCT 42.5 04/12/2012   MCV 97.7 04/12/2012   PLT 291 04/12/2012      Component Value Date/Time   NA 138 04/12/2012 0846   K 4.1 04/12/2012 0846   CL 102 04/12/2012 0846   CO2 26 04/12/2012 0846   GLUCOSE 87 04/12/2012 0846   BUN 27* 04/12/2012 0846   CREATININE 1.09 04/12/2012 0846   CALCIUM 9.7 04/12/2012 0846   PROT 6.7 09/29/2009 1757   ALBUMIN 3.6 09/29/2009 1757   AST 28 09/29/2009 1757   ALT 19 09/29/2009 1757   ALKPHOS 71 09/29/2009 1757   BILITOT 0.5 09/29/2009 1757   GFRNONAA 50* 04/12/2012 0846   GFRAA 58* 04/12/2012 0846   No results found for this basename: CHOL, HDL, LDLCALC, LDLDIRECT, TRIG, CHOLHDL   No results found for this basename: HGBA1C   No results found for this basename: VITAMINB12   No results found for this basename: TSH    I reviewed images myself and agree with interpretation. Minimal periventricular and subcortical foci of non-specific gliosis. -VRP   09/13/13 MRI brain - No acute infarct. Similar appearance of very mild nonspecific white matter type changes. Minimal to mild paranasal sinus mucosal thickening most notable inferior aspect left maxillary sinus. Nonvisualization right vertebral artery. This is without change.    ASSESSMENT  AND PLAN  73 y.o. year old female here with intermittent migratory paresthesias in the face, head, body. MRI brain is unremarkable. Neurologic examination unremarkable. May represent metabolic abnormality, neuropathy, height and anxiety state. Will check lab testing and monitor symptoms.  PLAN: Orders Placed This Encounter  Procedures  . Vitamin B12  . TSH  . Hemoglobin A1c  . CBC With differential/Platelet  . Comprehensive metabolic panel   Return in about 6 months (around 04/23/2014).    Suanne MarkerVIKRAM R. Carolin Quang, MD 10/21/2013, 12:52 PM Certified in Neurology, Neurophysiology and Neuroimaging  [facility] [address] [place], [place] [address] [phone]

## 2013-10-21 NOTE — Patient Instructions (Signed)
I will check blood testing.  Monitor symptoms.

## 2013-10-22 LAB — COMPREHENSIVE METABOLIC PANEL
ALT: 20 IU/L (ref 0–32)
AST: 29 IU/L (ref 0–40)
Albumin/Globulin Ratio: 1.9 (ref 1.1–2.5)
Albumin: 4.5 g/dL (ref 3.5–4.8)
Alkaline Phosphatase: 84 IU/L (ref 39–117)
BILIRUBIN TOTAL: 0.4 mg/dL (ref 0.0–1.2)
BUN / CREAT RATIO: 13 (ref 11–26)
BUN: 16 mg/dL (ref 8–27)
CALCIUM: 9.6 mg/dL (ref 8.7–10.3)
CO2: 25 mmol/L (ref 18–29)
CREATININE: 1.21 mg/dL — AB (ref 0.57–1.00)
Chloride: 102 mmol/L (ref 97–108)
GFR calc Af Amer: 51 mL/min/{1.73_m2} — ABNORMAL LOW (ref >59)
GFR, EST NON AFRICAN AMERICAN: 44 mL/min/{1.73_m2} — AB (ref >59)
GLOBULIN, TOTAL: 2.4 g/dL (ref 1.5–4.5)
Glucose: 90 mg/dL (ref 65–99)
Potassium: 4.2 mmol/L (ref 3.5–5.2)
Sodium: 144 mmol/L (ref 134–144)
Total Protein: 6.9 g/dL (ref 6.0–8.5)

## 2013-10-22 LAB — CBC WITH DIFFERENTIAL
BASOS: 1 %
Basophils Absolute: 0 10*3/uL (ref 0.0–0.2)
EOS ABS: 0.1 10*3/uL (ref 0.0–0.4)
EOS: 1 %
HEMATOCRIT: 42.8 % (ref 34.0–46.6)
Hemoglobin: 14.5 g/dL (ref 11.1–15.9)
IMMATURE GRANS (ABS): 0 10*3/uL (ref 0.0–0.1)
Immature Granulocytes: 0 %
LYMPHS ABS: 2.9 10*3/uL (ref 0.7–3.1)
Lymphs: 48 %
MCH: 31.9 pg (ref 26.6–33.0)
MCHC: 33.9 g/dL (ref 31.5–35.7)
MCV: 94 fL (ref 79–97)
Monocytes Absolute: 0.3 10*3/uL (ref 0.1–0.9)
Monocytes: 6 %
NEUTROS ABS: 2.6 10*3/uL (ref 1.4–7.0)
NEUTROS PCT: 44 %
Platelets: 251 10*3/uL (ref 150–379)
RBC: 4.55 x10E6/uL (ref 3.77–5.28)
RDW: 14.1 % (ref 12.3–15.4)
WBC: 5.9 10*3/uL (ref 3.4–10.8)

## 2013-10-22 LAB — HEMOGLOBIN A1C
Est. average glucose Bld gHb Est-mCnc: 123 mg/dL
Hgb A1c MFr Bld: 5.9 % — ABNORMAL HIGH (ref 4.8–5.6)

## 2013-10-22 LAB — VITAMIN B12: Vitamin B-12: 795 pg/mL (ref 211–946)

## 2013-10-22 LAB — TSH: TSH: 3.01 u[IU]/mL (ref 0.450–4.500)

## 2013-11-16 ENCOUNTER — Encounter: Payer: Self-pay | Admitting: Adult Health

## 2013-11-16 ENCOUNTER — Ambulatory Visit (INDEPENDENT_AMBULATORY_CARE_PROVIDER_SITE_OTHER): Payer: Medicare PPO | Admitting: Adult Health

## 2013-11-16 VITALS — BP 132/68 | Ht 68.0 in | Wt 190.0 lb

## 2013-11-16 DIAGNOSIS — N63 Unspecified lump in unspecified breast: Secondary | ICD-10-CM

## 2013-11-16 DIAGNOSIS — R928 Other abnormal and inconclusive findings on diagnostic imaging of breast: Secondary | ICD-10-CM

## 2013-11-16 HISTORY — DX: Unspecified lump in unspecified breast: N63.0

## 2013-11-16 NOTE — Patient Instructions (Signed)
Get mammogram 4/29 at 11 am

## 2013-11-16 NOTE — Progress Notes (Signed)
Subjective:     Patient ID: Hailey [name], female   DOB: 07/31/1941, 73 y.o.   MRN: [id number]  HPI Hailey [name] is a [age] year old white female in for breast exam she found knot a few days ago in right breast, and it is tender to her.  Review of Systems See HPI Reviewed past medical,surgical, social and family history. Reviewed medications and allergies.     Objective:   Physical Exam BP 132/68  Ht 5\' 8"  (1.727 m)  Wt 190 lb (86.183 kg)  BMI 28.90 kg/m2    Skin warm and dry,  Breasts:no dominate palpable mass, retraction or nipple discharge on left, no retraction or nipple discharge on right but has BB size nodule at 12 0'CLock about 3 FB from areola, pt says it is tender and has been there for few days  Assessment:     Breast nodule right breast   Plan:     Right [facility] and [facility]

## 2013-11-23 ENCOUNTER — Ambulatory Visit (HOSPITAL_COMMUNITY)
Admission: RE | Admit: 2013-11-23 | Discharge: 2013-11-23 | Disposition: A | Discharge status: Home / Self Care | Payer: Medicare PPO | Source: Ambulatory Visit | Attending: Adult Health | Admitting: Adult Health

## 2013-11-23 DIAGNOSIS — N63 Unspecified lump in unspecified breast: Secondary | ICD-10-CM

## 2013-11-30 ENCOUNTER — Encounter (HOSPITAL_COMMUNITY): Payer: Medicare PPO

## 2014-01-17 ENCOUNTER — Ambulatory Visit (INDEPENDENT_AMBULATORY_CARE_PROVIDER_SITE_OTHER): Payer: Medicare PPO | Admitting: Adult Health

## 2014-01-17 ENCOUNTER — Encounter: Payer: Self-pay | Admitting: Adult Health

## 2014-01-17 VITALS — BP 120/62 | HR 74 | Ht 68.0 in | Wt 189.5 lb

## 2014-01-17 DIAGNOSIS — Z01419 Encounter for gynecological examination (general) (routine) without abnormal findings: Secondary | ICD-10-CM

## 2014-01-17 DIAGNOSIS — Z1212 Encounter for screening for malignant neoplasm of rectum: Secondary | ICD-10-CM

## 2014-01-17 LAB — HEMOCCULT GUIAC POC 1CARD (OFFICE): Fecal Occult Blood, POC: NEGATIVE

## 2014-01-17 NOTE — Patient Instructions (Signed)
Physical in 2 years Mammogram yearly next one in February 2016 Colonoscopy per GI Follow up with Dr Reuel Boomaniel regarding hip Hip Pain The hips join the upper legs to the lower pelvis. The bones, cartilage, tendons, and muscles of the hip joint perform a lot of work each day holding your body weight and allowing you to move around. Hip pain is a common symptom. It can range from a minor ache to severe pain on 1 or both hips. Pain may be felt on the inside of the hip joint near the groin, or the outside near the buttocks and upper thigh. There may be swelling or stiffness as well. It occurs more often when a person walks or performs activity. There are many reasons hip pain can develop. CAUSES  It is important to work with your caregiver to identify the cause since many conditions can impact the bones, cartilage, muscles, and tendons of the hips. Causes for hip pain include:  Broken (fractured) bones.  Separation of the thighbone from the hip socket (dislocation).  Torn cartilage of the hip joint.  Swelling (inflammation) of a tendon (tendonitis), the sac within the hip joint (bursitis), or a joint.  A weakening in the abdominal wall (hernia), affecting the nerves to the hip.  Arthritis in the hip joint or lining of the hip joint.  Pinched nerves in the back, hip, or upper thigh.  A bulging disc in the spine (herniated disc).  Rarely, bone infection or cancer. DIAGNOSIS  The location of your hip pain will help your caregiver understand what may be causing the pain. A diagnosis is based on your medical history, your symptoms, results from your physical exam, and results from diagnostic tests. Diagnostic tests may include X-ray exams, a computerized magnetic scan (magnetic resonance imaging, MRI), or bone scan. TREATMENT  Treatment will depend on the cause of your hip pain. Treatment may include:  Limiting activities and resting until symptoms improve.  Crutches or other walking supports (a  cane or brace).  Ice, elevation, and compression.  Physical therapy or home exercises.  Shoe inserts or special shoes.  Losing weight.  Medications to reduce pain.  Undergoing surgery. HOME CARE INSTRUCTIONS   Only take over-the-counter or prescription medicines for pain, discomfort, or fever as directed by your caregiver.  Put ice on the injured area:  Put ice in a plastic bag.  Place a towel between your skin and the bag.  Leave the ice on for 15-20 minutes at a time, 03-04 times a day.  Keep your leg raised (elevated) when possible to lessen swelling.  Avoid activities that cause pain.  Follow specific exercises as directed by your caregiver.  Sleep with a pillow between your legs on your most comfortable side.  Record how often you have hip pain, the location of the pain, and what it feels like. This information may be helpful to you and your caregiver.  Ask your caregiver about returning to work or sports and whether you should drive.  Follow up with your caregiver for further exams, therapy, or testing as directed. SEEK MEDICAL CARE IF:   Your pain or swelling continues or worsens after 1 week.  You are feeling unwell or have chills.  You have increasing difficulty with walking.  You have a loss of sensation or other new symptoms.  You have questions or concerns. SEEK IMMEDIATE MEDICAL CARE IF:   You cannot put weight on the affected hip.  You have fallen.  You have a sudden increase  in pain and swelling in your hip.  You have a fever. MAKE SURE YOU:   Understand these instructions.  Will watch your condition.  Will get help right away if you are not doing well or get worse. Document Released: 01/08/2010 Document Revised: 10/13/2011 Document Reviewed: 01/08/2010 [facility] Patient Information 2014 [place], [place].

## 2014-01-17 NOTE — Progress Notes (Signed)
Patient ID: Hailey [name], female   DOB: 05/12/1941, 73 y.o.   MRN: [id number] History of Present Illness: Hailey [name] is a 73 year old white female in for physical.She complains of right hip that radiates to RLQ at times.   Current Medications, Allergies, Past Medical History, Past Surgical History, Family History and Social History were reviewed in [facility] electronic medical record.     Review of Systems: Patient denies any headaches, blurred vision, shortness of breath, chest pain, abdominal pain, problems with bowel movements, urination,no mood swings, see positive in HPI.    Physical Exam:BP 120/62  Pulse 74  Ht 5\' 8"  (1.727 m)  Wt 189 lb 8 oz (85.957 kg)  BMI 28.82 kg/m2 General:  Well developed, well nourished, no acute distress Skin:  Warm and dry Neck:  Midline trachea, normal thyroid, no carotid bruits heard Lungs; Clear to auscultation bilaterally Breast:  No dominant palpable mass, retraction, or nipple discharge, but has regular irregularities Cardiovascular: Regular rate and rhythm Abdomen:  Soft, non tender, no hepatosplenomegaly, has umbilical hernia that has returned after being fixed in past. Pelvic:  External genitalia is normal in appearance for age.  The vagina is atrophic with loss of rugae and moisture and the color is pale. The cervix and uterus are absent.  No adnexal masses or tenderness noted.No hernia felt. Rectal: Good sphincter tone, no polyps, or hemorrhoids felt.  Hemoccult negative. Extremities:  No swelling or varicosities noted Psych:  No mood change, alert and cooperative, seems happy   Impression: Yearly gyn no pap    Plan: Physical in 2 years, call prn problems or concerns Mammogram yearly and next due in February 2016 Colonoscopy per GI Follow up with Dr Reuel Boomdaniel about hip and review handout on hip pain

## 2014-03-27 ENCOUNTER — Other Ambulatory Visit: Payer: Self-pay | Admitting: Dermatology

## 2014-04-25 ENCOUNTER — Encounter (INDEPENDENT_AMBULATORY_CARE_PROVIDER_SITE_OTHER): Payer: Self-pay

## 2014-04-25 ENCOUNTER — Encounter: Payer: Self-pay | Admitting: Diagnostic Neuroimaging

## 2014-04-25 ENCOUNTER — Ambulatory Visit (INDEPENDENT_AMBULATORY_CARE_PROVIDER_SITE_OTHER): Payer: Medicare PPO | Admitting: Diagnostic Neuroimaging

## 2014-04-25 VITALS — BP 139/65 | HR 46 | Temp 97.4°F | Ht 68.0 in | Wt 189.0 lb

## 2014-04-25 DIAGNOSIS — R202 Paresthesia of skin: Principal | ICD-10-CM

## 2014-04-25 DIAGNOSIS — R209 Unspecified disturbances of skin sensation: Secondary | ICD-10-CM

## 2014-04-25 DIAGNOSIS — R6889 Other general symptoms and signs: Secondary | ICD-10-CM

## 2014-04-25 DIAGNOSIS — R200 Anesthesia of skin: Secondary | ICD-10-CM

## 2014-04-25 NOTE — Patient Instructions (Signed)
Follow up with PCP

## 2014-04-25 NOTE — Progress Notes (Signed)
[facility]  PATIENT: Hailey [name] DOB: 04-Aug-1941  REFERRING CLINICIAN:  HISTORY FROM: patient  REASON FOR VISIT: follow up   HISTORICAL  CHIEF COMPLAINT:  No chief complaint on file.   HISTORY OF PRESENT ILLNESS:   UPDATE 04/25/14: Doing much better. No further events. Test results reviewed. Does report some other events over the past 4 years which sound like migraine variants (neck pain, head pain, seeing zig zag lines, nausea).   PRIOR HPI (10/21/13): 73 year old female here for evaluation of numbness and tingling. December 2014 patient was a sitting on the sofa when she felt a pressure sensation of the top of her head, followed by electrical "zap" sensations scatted through the bilateral face and radiating to left arm. Symptoms lasted 5 minutes. Symptoms then subsided. Since that time she's had intermittent migratory paresthesias throughout her face, scalp, arms, legs, body. Episodes now last less than a second time. This happens multiple times throughout the day. No triggering or alleviating factors. Symptoms are not painful. She has been little bit more concerned and bothered by these lately. Patient had MRI brain which was unremarkable. No other recent changes in sleep, stress, diet, exercise. 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   REVIEW OF SYSTEMS: Full 14 system review of systems performed and notable only for palpitations joint pain ringing in ears.   ALLERGIES: No Known Allergies  HOME MEDICATIONS: Outpatient Prescriptions Prior to Visit  Medication Sig Dispense Refill  . Calcium Carbonate-Vit D-Min 600-400 MG-UNIT TABS Take by mouth daily.       . metoprolol tartrate (LOPRESSOR) 25 MG tablet Take 25 mg by mouth 3 (three) times daily.       . Multiple Vitamins-Minerals (CENTRUM SILVER) tablet Take 1 tablet by mouth daily.        Marland Kitchen  omeprazole (PRILOSEC) 20 MG capsule Take 20 mg by mouth 2 (two) times daily.       No facility-administered medications prior to visit.    PAST MEDICAL HISTORY: Past Medical History  Diagnosis Date  . Meniere's disease     ringing in the ears  . Palpitations   . HLD (hyperlipidemia)   . H/O hematuria   . Vaginal atrophy   . Hypertension   . Breast nodule 11/16/2013    PAST SURGICAL HISTORY: Past Surgical History  Procedure Laterality Date  . Appendectomy    . Cardiac catheterization  2005     cone  . Total abdominal hysterectomy    . Hernia repair    . Colonoscopy      FAMILY HISTORY: Family History  Problem Relation Age of Onset  . Coronary artery disease Father     mid 77s  . Hypertension Mother   . Stroke Brother   . Hypertension Brother   . Stroke Brother   . Heart disease Brother   . Heart attack Brother     SOCIAL HISTORY:  History   Social History  . Marital Status: Single    Spouse Name: N/A    Number of Children: 0  . Years of Education: College   Occupational History  . retired    Social History Main Topics  . Smoking status: Never Smoker   . Smokeless tobacco: Never Used  . Alcohol Use: Yes     Comment: rarely; glas of wine with diner  . Drug Use: No  .  Sexual Activity: No   Other Topics Concern  . Not on file   Social History Narrative   Single. Retired.    Patient lives at home alone.   Caffeine Use: none     PHYSICAL EXAM  Filed Vitals:   04/25/14 1211  BP: 139/65  Pulse: 46  Temp: 97.4 F (36.3 C)  TempSrc: Oral  Height:  (1.727 m)  Weight: 189 lb (85.73 kg)    Not recorded    Body mass index is 28.74 kg/(m^2).  GENERAL EXAM: Patient is in no distress; well developed, nourished and groomed; neck is supple  CARDIOVASCULAR: Regular rate and rhythm, no murmurs, no carotid bruits  NEUROLOGIC: MENTAL STATUS: awake, alert, language fluent, comprehension intact, naming intact, fund of knowledge  appropriate CRANIAL NERVE: no papilledema on fundoscopic exam, pupils equal and reactive to light, visual fields full to confrontation, extraocular muscles intact, no nystagmus, facial sensation and strength symmetric, hearing intact, palate elevates symmetrically, uvula midline, shoulder shrug symmetric, tongue midline. MOTOR: normal bulk and tone, full strength in the BUE, BLE SENSORY: normal and symmetric to light touch  COORDINATION: finger-nose-finger, fine finger movements normal REFLEXES: deep tendon reflexes present and symmetric GAIT/STATION: narrow based gait; able to walk on tandem; romberg is negative    DIAGNOSTIC DATA (LABS, IMAGING, TESTING) - I reviewed patient records, labs, notes, testing and imaging myself where available.  Lab Results  Component Value Date   WBC 5.9 10/21/2013   HGB 14.5 10/21/2013   HCT 42.8 10/21/2013   MCV 94 10/21/2013   PLT 251 10/21/2013      Component Value Date/Time   NA 144 10/21/2013 1310   NA 138 04/12/2012 0846   K 4.2 10/21/2013 1310   CL 102 10/21/2013 1310   CO2 25 10/21/2013 1310   GLUCOSE 90 10/21/2013 1310   GLUCOSE 87 04/12/2012 0846   BUN 16 10/21/2013 1310   BUN 27* 04/12/2012 0846   CREATININE 1.21* 10/21/2013 1310   CALCIUM 9.6 10/21/2013 1310   PROT 6.9 10/21/2013 1310   PROT 6.7 09/29/2009 1757   ALBUMIN 3.6 09/29/2009 1757   AST 29 10/21/2013 1310   ALT 20 10/21/2013 1310   ALKPHOS 84 10/21/2013 1310   BILITOT 0.4 10/21/2013 1310   GFRNONAA 44* 10/21/2013 1310   GFRAA 51* 10/21/2013 1310   No results found for this basename: CHOL,  HDL,  LDLCALC,  LDLDIRECT,  TRIG,  CHOLHDL   Lab Results  Component Value Date   HGBA1C 5.9* 10/21/2013   Lab Results  Component Value Date   VITAMINB12 795 10/21/2013   Lab Results  Component Value Date   TSH 3.010 10/21/2013    I reviewed images myself and agree with interpretation. Minimal periventricular and subcortical foci of non-specific gliosis. -VRP   09/13/13 MRI brain - No acute infarct.  Similar appearance of very mild nonspecific white matter type changes. Minimal to mild paranasal sinus mucosal thickening most notable inferior aspect left maxillary sinus. Nonvisualization right vertebral artery. This is without change.    ASSESSMENT AND PLAN  73 y.o. year old female here with intermittent migratory paresthesias in the face, head, body. MRI brain is unremarkable. Neurologic examination unremarkable. Labs unremarkable. May represent migraine variant, benign paresthesias or heightened anxiety state. Symptoms have resolved.  PLAN: - follow up as needed  Return for return to PCP.    Suanne Marker, MD 04/25/2014, 1:00 PM Certified in Neurology, Neurophysiology and Neuroimaging  [facility] [address]  [place], [place] [address] [phone]

## 2014-06-05 ENCOUNTER — Encounter: Payer: Self-pay | Admitting: Diagnostic Neuroimaging

## 2014-07-13 ENCOUNTER — Encounter (HOSPITAL_COMMUNITY): Payer: Self-pay | Admitting: Cardiovascular Disease

## 2014-09-26 ENCOUNTER — Other Ambulatory Visit (HOSPITAL_COMMUNITY): Payer: Self-pay | Admitting: Family Medicine

## 2014-09-26 DIAGNOSIS — Z1231 Encounter for screening mammogram for malignant neoplasm of breast: Secondary | ICD-10-CM

## 2014-09-27 ENCOUNTER — Other Ambulatory Visit: Payer: Self-pay | Admitting: Dermatology

## 2014-09-29 ENCOUNTER — Ambulatory Visit (HOSPITAL_COMMUNITY)
Admission: RE | Admit: 2014-09-29 | Discharge: 2014-09-29 | Disposition: A | Discharge status: Home / Self Care | Payer: Medicare PPO | Source: Ambulatory Visit | Attending: Family Medicine | Admitting: Family Medicine

## 2014-09-29 DIAGNOSIS — Z1231 Encounter for screening mammogram for malignant neoplasm of breast: Secondary | ICD-10-CM | POA: Diagnosis not present

## 2014-10-19 DIAGNOSIS — H02055 Trichiasis without entropian left lower eyelid: Secondary | ICD-10-CM | POA: Diagnosis not present

## 2014-10-19 DIAGNOSIS — H524 Presbyopia: Secondary | ICD-10-CM | POA: Diagnosis not present

## 2014-10-19 DIAGNOSIS — H52223 Regular astigmatism, bilateral: Secondary | ICD-10-CM | POA: Diagnosis not present

## 2015-01-18 DIAGNOSIS — J309 Allergic rhinitis, unspecified: Secondary | ICD-10-CM | POA: Diagnosis not present

## 2015-01-24 ENCOUNTER — Ambulatory Visit (INDEPENDENT_AMBULATORY_CARE_PROVIDER_SITE_OTHER): Payer: Medicare PPO | Admitting: Adult Health

## 2015-01-24 ENCOUNTER — Encounter: Payer: Self-pay | Admitting: Adult Health

## 2015-01-24 VITALS — BP 128/70 | HR 52 | Ht 67.25 in | Wt 188.5 lb

## 2015-01-24 DIAGNOSIS — Z1211 Encounter for screening for malignant neoplasm of colon: Secondary | ICD-10-CM | POA: Diagnosis not present

## 2015-01-24 DIAGNOSIS — R319 Hematuria, unspecified: Secondary | ICD-10-CM

## 2015-01-24 DIAGNOSIS — Z01419 Encounter for gynecological examination (general) (routine) without abnormal findings: Secondary | ICD-10-CM | POA: Diagnosis not present

## 2015-01-24 DIAGNOSIS — Z90710 Acquired absence of both cervix and uterus: Secondary | ICD-10-CM

## 2015-01-24 DIAGNOSIS — N63 Unspecified lump in breast: Secondary | ICD-10-CM | POA: Diagnosis not present

## 2015-01-24 DIAGNOSIS — N6310 Unspecified lump in the right breast, unspecified quadrant: Secondary | ICD-10-CM

## 2015-01-24 LAB — POCT URINALYSIS DIPSTICK
GLUCOSE UA: NEGATIVE
Leukocytes, UA: NEGATIVE
NITRITE UA: NEGATIVE
PROTEIN UA: NEGATIVE

## 2015-01-24 LAB — HEMOCCULT GUIAC POC 1CARD (OFFICE): FECAL OCCULT BLD: NEGATIVE

## 2015-01-24 MED ORDER — ESTROGENS, CONJUGATED 0.625 MG/GM VA CREA: TOPICAL_CREAM | VAGINAL | Status: DC

## 2015-01-24 NOTE — Progress Notes (Signed)
Patient ID: Hailey [name], female   DOB: 04-22-1941, 74 y.o.   MRN: [id number] History of Present Illness:  Hailey [name] is a 74 year old white female in for well woman gyn exam, she is sp hysterectomy.She noticed blood in her urine about a month ago and she had stopped using premarin vaginal cream but has started back.She had body aches and hip pain at times.She said she had hematuria in past and had cysts on kidneys. PCP is Dr Reuel Boom in [place].  Current Medications, Allergies, Past Medical History, Past Surgical History, Family History and Social History were reviewed in [facility] record.     Review of Systems: Patient denies any headaches, hearing loss, fatigue, blurred vision, shortness of breath, chest pain, abdominal pain, problems with bowel movements, urination, or intercourse(not having sex). No joint swelling or mood swings.See HPI for positives.    Physical Exam:BP 128/70 mmHg  Pulse 52  Ht 5' 7.25" (1.708 m)  Wt 188 lb 8 oz (85.503 kg)  BMI 29.31 kg/m2 urine trace blood General:  Well developed, well nourished, no acute distress Skin:  Warm and dry Neck:  Midline trachea, normal thyroid, good ROM, no lymphadenopathy, no carotid bruits heard Lungs; Clear to auscultation bilaterally Breast:  No dominant palpable mass, retraction, or nipple discharge on left, on right, no retraction or nipple discharge, has mobile tender mass at 10-12 o'clock, has had something here before, but had normal mammogram in February, has dense fibroglandular tissue Cardiovascular: Regular rate and rhythm Abdomen:  Soft, non tender, no hepatosplenomegaly Pelvic:  External genitalia is normal in appearance, no lesions.  The vagina is atrophic. Urethra has no lesions or masses. The cervix and uterus are absent. No adnexal masses or tenderness noted.Bladder is non tender, no masses felt. Rectal: Good sphincter tone, no polyps, or hemorrhoids felt.  Hemoccult  negative. Extremities/musculoskeletal:  No swelling or varicosities noted, no clubbing or cyanosis Psych:  No mood changes, alert and cooperative,seems happy   Impression: Well woman gyn exam no pap Hematuria Right breat nodule   Plan: Rx premarin vaginal cream use 2-3 times a week with 3 refills Right diagnostic mammogram and [place] 7/5 at 8 am at [facility] Physical in 2 years Labs with PCP Colonoscopy per GI UA C&S sent

## 2015-01-24 NOTE — Patient Instructions (Signed)
Right mammogram 7/5 at 8 am Physical in 2 years Increase water Hematuria Hematuria is blood in your urine. It can be caused by a bladder infection, kidney infection, prostate infection, kidney stone, or cancer of your urinary tract. Infections can usually be treated with medicine, and a kidney stone usually will pass through your urine. If neither of these is the cause of your hematuria, further workup to find out the reason may be needed. It is very important that you tell your health care provider about any blood you see in your urine, even if the blood stops without treatment or happens without causing pain. Blood in your urine that happens and then stops and then happens again can be a symptom of a very serious condition. Also, pain is not a symptom in the initial stages of many urinary cancers. HOME CARE INSTRUCTIONS   Drink lots of fluid, 3-4 quarts a day. If you have been diagnosed with an infection, cranberry juice is especially recommended, in addition to large amounts of water.  Avoid caffeine, tea, and carbonated beverages because they tend to irritate the bladder.  Avoid alcohol because it may irritate the prostate.  Take all medicines as directed by your health care provider.  If you were prescribed an antibiotic medicine, finish it all even if you start to feel better.  If you have been diagnosed with a kidney stone, follow your health care provider's instructions regarding straining your urine to catch the stone.  Empty your bladder often. Avoid holding urine for long periods of time.  After a bowel movement, women should cleanse front to back. Use each tissue only once.  Empty your bladder before and after sexual intercourse if you are a female. SEEK MEDICAL CARE IF:  You develop back pain.  You have a fever.  You have a feeling of sickness in your stomach (nausea) or vomiting.  Your symptoms are not better in 3 days. Return sooner if you are getting worse. SEEK  IMMEDIATE MEDICAL CARE IF:   You develop severe vomiting and are unable to keep the medicine down.  You develop severe back or abdominal pain despite taking your medicines.  You begin passing a large amount of blood or clots in your urine.  You feel extremely weak or faint, or you pass out. MAKE SURE YOU:   Understand these instructions.  Will watch your condition.  Will get help right away if you are not doing well or get worse. Document Released: 07/21/2005 Document Revised: 12/05/2013 Document Reviewed: 03/21/2013 [facility] Patient Information 2015 [place], [place]. This information is not intended to replace advice given to you by your health care provider. Make sure you discuss any questions you have with your health care provider.

## 2015-01-25 LAB — URINALYSIS, ROUTINE W REFLEX MICROSCOPIC
Bilirubin, UA: NEGATIVE
Glucose, UA: NEGATIVE
KETONES UA: NEGATIVE
LEUKOCYTES UA: NEGATIVE
Nitrite, UA: NEGATIVE
PROTEIN UA: NEGATIVE
RBC UA: NEGATIVE
Specific Gravity, UA: 1.013 (ref 1.005–1.030)
Urobilinogen, Ur: 0.2 mg/dL (ref 0.2–1.0)
pH, UA: 6 (ref 5.0–7.5)

## 2015-01-26 LAB — URINE CULTURE: ORGANISM ID, BACTERIA: NO GROWTH

## 2015-01-30 DIAGNOSIS — H6503 Acute serous otitis media, bilateral: Secondary | ICD-10-CM | POA: Diagnosis not present

## 2015-01-31 ENCOUNTER — Telehealth: Payer: Self-pay | Admitting: *Deleted

## 2015-01-31 ENCOUNTER — Telehealth: Payer: Self-pay | Admitting: Adult Health

## 2015-01-31 NOTE — Telephone Encounter (Signed)
Spoke with pt letting her know her urine culture showed no growth. Pt was concerned with the blood that was present on urine dip. Pt not having any urinary symptoms at this time. Advised to increase water to flush system out and if she starts having burning with urination or pain with urination to let us know. Not sure why blood showed up on her urine dip. Pt voiced understanding. JSY

## 2015-01-31 NOTE — Telephone Encounter (Signed)
Pt has 2 charts in EPIC. No previous documentation in this chart. Ticket placed to merge chart. JSY

## 2015-02-02 DIAGNOSIS — H9203 Otalgia, bilateral: Secondary | ICD-10-CM | POA: Diagnosis not present

## 2015-02-02 DIAGNOSIS — J309 Allergic rhinitis, unspecified: Secondary | ICD-10-CM | POA: Diagnosis not present

## 2015-02-06 ENCOUNTER — Ambulatory Visit (HOSPITAL_COMMUNITY)
Admission: RE | Admit: 2015-02-06 | Discharge: 2015-02-06 | Disposition: A | Discharge status: Home / Self Care | Payer: Medicare PPO | Source: Ambulatory Visit | Attending: Adult Health | Admitting: Adult Health

## 2015-02-06 DIAGNOSIS — N63 Unspecified lump in breast: Secondary | ICD-10-CM | POA: Insufficient documentation

## 2015-02-06 DIAGNOSIS — N6310 Unspecified lump in the right breast, unspecified quadrant: Secondary | ICD-10-CM

## 2015-02-06 DIAGNOSIS — R928 Other abnormal and inconclusive findings on diagnostic imaging of breast: Secondary | ICD-10-CM | POA: Diagnosis not present

## 2015-03-19 DIAGNOSIS — J309 Allergic rhinitis, unspecified: Secondary | ICD-10-CM | POA: Diagnosis not present

## 2015-03-19 DIAGNOSIS — H9203 Otalgia, bilateral: Secondary | ICD-10-CM | POA: Diagnosis not present

## 2015-03-26 DIAGNOSIS — D485 Neoplasm of uncertain behavior of skin: Secondary | ICD-10-CM | POA: Diagnosis not present

## 2015-03-26 DIAGNOSIS — L814 Other melanin hyperpigmentation: Secondary | ICD-10-CM | POA: Diagnosis not present

## 2015-03-26 DIAGNOSIS — L821 Other seborrheic keratosis: Secondary | ICD-10-CM | POA: Diagnosis not present

## 2015-03-26 DIAGNOSIS — L570 Actinic keratosis: Secondary | ICD-10-CM | POA: Diagnosis not present

## 2015-03-26 DIAGNOSIS — D2271 Melanocytic nevi of right lower limb, including hip: Secondary | ICD-10-CM | POA: Diagnosis not present

## 2015-03-26 DIAGNOSIS — Z85828 Personal history of other malignant neoplasm of skin: Secondary | ICD-10-CM | POA: Diagnosis not present

## 2015-04-12 DIAGNOSIS — I10 Essential (primary) hypertension: Secondary | ICD-10-CM | POA: Diagnosis not present

## 2015-04-12 DIAGNOSIS — K210 Gastro-esophageal reflux disease with esophagitis: Secondary | ICD-10-CM | POA: Diagnosis not present

## 2015-04-12 DIAGNOSIS — R5382 Chronic fatigue, unspecified: Secondary | ICD-10-CM | POA: Diagnosis not present

## 2015-04-12 DIAGNOSIS — R1013 Epigastric pain: Secondary | ICD-10-CM | POA: Diagnosis not present

## 2015-04-12 DIAGNOSIS — E782 Mixed hyperlipidemia: Secondary | ICD-10-CM | POA: Diagnosis not present

## 2015-04-19 DIAGNOSIS — J309 Allergic rhinitis, unspecified: Secondary | ICD-10-CM | POA: Diagnosis not present

## 2015-04-19 DIAGNOSIS — E782 Mixed hyperlipidemia: Secondary | ICD-10-CM | POA: Diagnosis not present

## 2015-04-19 DIAGNOSIS — N183 Chronic kidney disease, stage 3 (moderate): Secondary | ICD-10-CM | POA: Diagnosis not present

## 2015-04-19 DIAGNOSIS — K219 Gastro-esophageal reflux disease without esophagitis: Secondary | ICD-10-CM | POA: Diagnosis not present

## 2015-04-19 DIAGNOSIS — G2581 Restless legs syndrome: Secondary | ICD-10-CM | POA: Diagnosis not present

## 2015-04-19 DIAGNOSIS — Z23 Encounter for immunization: Secondary | ICD-10-CM | POA: Diagnosis not present

## 2015-04-19 DIAGNOSIS — I10 Essential (primary) hypertension: Secondary | ICD-10-CM | POA: Diagnosis not present

## 2015-04-26 DIAGNOSIS — R200 Anesthesia of skin: Secondary | ICD-10-CM | POA: Diagnosis not present

## 2015-04-26 DIAGNOSIS — G459 Transient cerebral ischemic attack, unspecified: Secondary | ICD-10-CM | POA: Diagnosis not present

## 2015-05-01 DIAGNOSIS — G459 Transient cerebral ischemic attack, unspecified: Secondary | ICD-10-CM | POA: Diagnosis not present

## 2015-05-01 DIAGNOSIS — R209 Unspecified disturbances of skin sensation: Secondary | ICD-10-CM | POA: Diagnosis not present

## 2015-05-01 DIAGNOSIS — R200 Anesthesia of skin: Secondary | ICD-10-CM | POA: Diagnosis not present

## 2015-05-02 DIAGNOSIS — E782 Mixed hyperlipidemia: Secondary | ICD-10-CM | POA: Diagnosis not present

## 2015-05-02 DIAGNOSIS — I517 Cardiomegaly: Secondary | ICD-10-CM | POA: Diagnosis not present

## 2015-05-02 DIAGNOSIS — R600 Localized edema: Secondary | ICD-10-CM | POA: Diagnosis not present

## 2015-05-02 DIAGNOSIS — R200 Anesthesia of skin: Secondary | ICD-10-CM | POA: Diagnosis not present

## 2015-05-02 DIAGNOSIS — N183 Chronic kidney disease, stage 3 (moderate): Secondary | ICD-10-CM | POA: Diagnosis not present

## 2015-05-02 DIAGNOSIS — G459 Transient cerebral ischemic attack, unspecified: Secondary | ICD-10-CM | POA: Diagnosis not present

## 2015-05-02 DIAGNOSIS — I10 Essential (primary) hypertension: Secondary | ICD-10-CM | POA: Diagnosis not present

## 2015-05-02 DIAGNOSIS — I6521 Occlusion and stenosis of right carotid artery: Secondary | ICD-10-CM | POA: Diagnosis not present

## 2015-05-02 DIAGNOSIS — K219 Gastro-esophageal reflux disease without esophagitis: Secondary | ICD-10-CM | POA: Diagnosis not present

## 2015-05-02 DIAGNOSIS — R931 Abnormal findings on diagnostic imaging of heart and coronary circulation: Secondary | ICD-10-CM | POA: Diagnosis not present

## 2015-05-19 DIAGNOSIS — K219 Gastro-esophageal reflux disease without esophagitis: Secondary | ICD-10-CM | POA: Diagnosis not present

## 2015-05-19 DIAGNOSIS — I471 Supraventricular tachycardia: Secondary | ICD-10-CM | POA: Diagnosis not present

## 2015-05-19 DIAGNOSIS — R002 Palpitations: Secondary | ICD-10-CM | POA: Diagnosis not present

## 2015-05-19 DIAGNOSIS — Z79899 Other long term (current) drug therapy: Secondary | ICD-10-CM | POA: Diagnosis not present

## 2015-05-19 DIAGNOSIS — R0602 Shortness of breath: Secondary | ICD-10-CM | POA: Diagnosis not present

## 2015-05-19 DIAGNOSIS — J45909 Unspecified asthma, uncomplicated: Secondary | ICD-10-CM | POA: Diagnosis not present

## 2015-05-20 DIAGNOSIS — I471 Supraventricular tachycardia: Secondary | ICD-10-CM | POA: Diagnosis not present

## 2015-05-20 DIAGNOSIS — R002 Palpitations: Secondary | ICD-10-CM | POA: Diagnosis not present

## 2015-05-31 DIAGNOSIS — E042 Nontoxic multinodular goiter: Secondary | ICD-10-CM | POA: Diagnosis not present

## 2015-06-05 DIAGNOSIS — G459 Transient cerebral ischemic attack, unspecified: Secondary | ICD-10-CM | POA: Diagnosis not present

## 2015-06-19 DIAGNOSIS — E041 Nontoxic single thyroid nodule: Secondary | ICD-10-CM | POA: Diagnosis not present

## 2015-06-19 DIAGNOSIS — R002 Palpitations: Secondary | ICD-10-CM | POA: Diagnosis not present

## 2015-08-14 ENCOUNTER — Encounter: Payer: Self-pay | Admitting: Adult Health

## 2015-08-14 ENCOUNTER — Ambulatory Visit (INDEPENDENT_AMBULATORY_CARE_PROVIDER_SITE_OTHER): Payer: Medicare Other | Admitting: Adult Health

## 2015-08-14 VITALS — BP 150/70 | HR 49 | Ht 67.0 in | Wt 189.0 lb

## 2015-08-14 DIAGNOSIS — N63 Unspecified lump in breast: Secondary | ICD-10-CM | POA: Diagnosis not present

## 2015-08-14 DIAGNOSIS — N6310 Unspecified lump in the right breast, unspecified quadrant: Secondary | ICD-10-CM

## 2015-08-14 NOTE — Progress Notes (Signed)
Subjective:     Patient ID: Hailey [name], female   DOB: 11/01/1940, 75 y.o.   MRN: [id number]  HPI [name] is a 75 year old white female in complaining of lump in right breast for about a week.  Review of Systems Patient denies any headaches, hearing loss, fatigue, blurred vision, shortness of breath, chest pain, abdominal pain, problems with bowel movements, urination, or intercourse. No joint pain or mood swings.See HPI for positives. Reviewed past medical,surgical, social and family history. Reviewed medications and allergies.     Objective:   Physical Exam BP 150/70 mmHg  Pulse 49  Ht 5\' 7"  (1.702 m)  Wt 189 lb (85.73 kg)  BMI 29.59 kg/m2  Skin warm and dry,  Breasts:no dominate palpable mass, retraction or nipple discharge on left, on right at 10'clock at edge of areola, there is a peas sized area, non mobile mildly tender and skin looks swollen near it, no retraction or nipple discharge, will get mammogram     Assessment:     Right breast mass    Plan:      Get right breast diagnostic mammogram and right [facility] needed [name]   Follow up prn

## 2015-08-14 NOTE — Patient Instructions (Signed)
[name]

## 2015-08-28 ENCOUNTER — Ambulatory Visit (HOSPITAL_COMMUNITY)
Admission: RE | Admit: 2015-08-28 | Discharge: 2015-08-28 | Disposition: A | Discharge status: Home / Self Care | Payer: Medicare Other | Source: Ambulatory Visit | Attending: Adult Health | Admitting: Adult Health

## 2015-08-28 ENCOUNTER — Other Ambulatory Visit: Payer: Self-pay | Admitting: Adult Health

## 2015-08-28 DIAGNOSIS — N6001 Solitary cyst of right breast: Secondary | ICD-10-CM | POA: Insufficient documentation

## 2015-08-28 DIAGNOSIS — Z1231 Encounter for screening mammogram for malignant neoplasm of breast: Secondary | ICD-10-CM

## 2015-08-28 DIAGNOSIS — N63 Unspecified lump in breast: Secondary | ICD-10-CM | POA: Insufficient documentation

## 2015-08-28 DIAGNOSIS — N6310 Unspecified lump in the right breast, unspecified quadrant: Secondary | ICD-10-CM

## 2015-10-01 ENCOUNTER — Ambulatory Visit (HOSPITAL_COMMUNITY)
Admission: RE | Admit: 2015-10-01 | Discharge: 2015-10-01 | Disposition: A | Discharge status: Home / Self Care | Payer: Medicare Other | Source: Ambulatory Visit | Attending: Adult Health | Admitting: Adult Health

## 2015-10-01 DIAGNOSIS — Z1231 Encounter for screening mammogram for malignant neoplasm of breast: Secondary | ICD-10-CM | POA: Diagnosis not present

## 2016-01-28 ENCOUNTER — Encounter: Payer: Self-pay | Admitting: Adult Health

## 2016-01-28 ENCOUNTER — Ambulatory Visit (INDEPENDENT_AMBULATORY_CARE_PROVIDER_SITE_OTHER): Payer: Medicare Other | Admitting: Adult Health

## 2016-01-28 VITALS — BP 142/70 | HR 48 | Ht 67.0 in | Wt 189.0 lb

## 2016-01-28 DIAGNOSIS — Z01419 Encounter for gynecological examination (general) (routine) without abnormal findings: Secondary | ICD-10-CM | POA: Diagnosis not present

## 2016-01-28 DIAGNOSIS — Z1212 Encounter for screening for malignant neoplasm of rectum: Secondary | ICD-10-CM

## 2016-01-28 LAB — HEMOCCULT GUIAC POC 1CARD (OFFICE): Fecal Occult Blood, POC: NEGATIVE

## 2016-01-28 NOTE — Patient Instructions (Signed)
Physical in 2 years Mammogram yearly  Labs with PCP 

## 2016-01-28 NOTE — Progress Notes (Signed)
Patient ID: Hailey [name], female   DOB: 04/04/1941, 75 y.o.   MRN: [id number] History of Present Illness: Hailey [name] is a 75 year old white female in for a well woman gyn exam, she is sp hysterectomy. PCP is Dr Reuel Boomaniel.   Current Medications, Allergies, Past Medical History, Past Surgical History, Family History and Social History were reviewed in [facility] electronic medical record.     Review of Systems: Patient denies any headaches, hearing loss, fatigue, blurred vision, shortness of breath, chest pain, abdominal pain, problems with bowel movements, urination, or intercourse(not having sex). No joint pain or mood swings. She has for years now had episodes of fluttering in chest and has had negative work up, she says TUMS helps.   Physical Exam:BP 142/70 mmHg  Pulse 48  Ht 5\' 7"  (1.702 m)  Wt 189 lb (85.73 kg)  BMI 29.59 kg/m2 General:  Well developed, well nourished, no acute distress Skin:  Warm and dry Neck:  Midline trachea, normal thyroid, good ROM, no lymphadenopathy,no carotid bruits heard Lungs; Clear to auscultation bilaterally Breast:  No dominant palpable mass, retraction, or nipple discharge,has some irregularities esp in right breast Cardiovascular: Regular rate and rhythm Abdomen:  Soft, non tender, no hepatosplenomegaly Pelvic:  External genitalia is normal in appearance, no lesions.  The vagina is atrophic. Urethra has no lesions or masses. The cervix and uterus are absent. No adnexal masses or tenderness noted.Bladder is non tender, no masses felt. Rectal: Good sphincter tone, no polyps, or hemorrhoids felt.  Hemoccult negative. Extremities/musculoskeletal:  No swelling or varicosities noted, no clubbing or cyanosis Psych:  No mood changes, alert and cooperative,seems happy   Impression: Well woman gyn exam no pap    Plan: Physical in 2 years Mammogram yearly Labs with PCP

## 2016-08-01 ENCOUNTER — Encounter: Payer: Self-pay | Admitting: Adult Health

## 2016-08-01 ENCOUNTER — Encounter (INDEPENDENT_AMBULATORY_CARE_PROVIDER_SITE_OTHER): Payer: Self-pay

## 2016-08-01 ENCOUNTER — Ambulatory Visit (INDEPENDENT_AMBULATORY_CARE_PROVIDER_SITE_OTHER): Payer: Medicare Other | Admitting: Adult Health

## 2016-08-01 VITALS — BP 130/72 | HR 51 | Ht 68.0 in | Wt 190.5 lb

## 2016-08-01 DIAGNOSIS — N6313 Unspecified lump in the right breast, lower outer quadrant: Secondary | ICD-10-CM

## 2016-08-01 DIAGNOSIS — N6310 Unspecified lump in the right breast, unspecified quadrant: Secondary | ICD-10-CM

## 2016-08-01 NOTE — Patient Instructions (Signed)
[facility]

## 2016-08-01 NOTE — Progress Notes (Signed)
Subjective:     Patient ID: Hailey [name], female   DOB: 10/08/1940, 75 y.o.   MRN: [id number]  HPI Hailey [name] is a 75 year old white female in complaining of right breast lump for about a week.   Review of Systems Right breast lump x 1 week Reviewed past medical,surgical, social and family history. Reviewed medications and allergies.     Objective:   Physical Exam BP 130/72 (BP Location: Left Arm, Patient Position: Sitting, Cuff Size: Normal)   Pulse (!) 51   Ht 5\' 8"  (1.727 m)   Wt 190 lb 8 oz (86.4 kg)   BMI 28.97 kg/m PHQ 2 score 0.  Skin warm and dry,  Breasts:no dominate palpable mass, retraction or nipple discharge on left, on right, no retraction or nipple discharge, but has round 2 cm mobile nodule at 8 o'clock.    Assessment:       1. Breast mass, right       Plan:     [facility]   Follow up prn

## 2016-08-05 ENCOUNTER — Ambulatory Visit (HOSPITAL_COMMUNITY)
Admission: RE | Admit: 2016-08-05 | Discharge: 2016-08-05 | Disposition: A | Discharge status: Home / Self Care | Payer: Medicare Other | Source: Ambulatory Visit | Attending: Adult Health | Admitting: Adult Health

## 2016-08-05 DIAGNOSIS — N6310 Unspecified lump in the right breast, unspecified quadrant: Secondary | ICD-10-CM

## 2016-11-13 ENCOUNTER — Other Ambulatory Visit: Payer: Self-pay | Admitting: Adult Health

## 2016-11-13 DIAGNOSIS — Z1231 Encounter for screening mammogram for malignant neoplasm of breast: Secondary | ICD-10-CM

## 2016-11-24 ENCOUNTER — Other Ambulatory Visit: Payer: Self-pay | Admitting: Adult Health

## 2016-11-24 ENCOUNTER — Ambulatory Visit (HOSPITAL_COMMUNITY)
Admission: RE | Admit: 2016-11-24 | Discharge: 2016-11-24 | Disposition: A | Discharge status: Home / Self Care | Payer: Medicare Other | Source: Ambulatory Visit | Attending: Adult Health | Admitting: Adult Health

## 2016-11-24 DIAGNOSIS — R928 Other abnormal and inconclusive findings on diagnostic imaging of breast: Secondary | ICD-10-CM

## 2016-11-24 DIAGNOSIS — Z1231 Encounter for screening mammogram for malignant neoplasm of breast: Secondary | ICD-10-CM | POA: Insufficient documentation

## 2016-12-02 ENCOUNTER — Ambulatory Visit (HOSPITAL_COMMUNITY)
Admission: RE | Admit: 2016-12-02 | Discharge: 2016-12-02 | Disposition: A | Discharge status: Home / Self Care | Payer: Medicare Other | Source: Ambulatory Visit | Attending: Adult Health | Admitting: Adult Health

## 2016-12-02 DIAGNOSIS — R928 Other abnormal and inconclusive findings on diagnostic imaging of breast: Secondary | ICD-10-CM | POA: Diagnosis not present

## 2017-01-28 ENCOUNTER — Encounter: Payer: Self-pay | Admitting: Adult Health

## 2017-01-28 ENCOUNTER — Ambulatory Visit (INDEPENDENT_AMBULATORY_CARE_PROVIDER_SITE_OTHER): Payer: Medicare Other | Admitting: Adult Health

## 2017-01-28 VITALS — BP 130/82 | HR 66 | Ht 67.5 in | Wt 193.0 lb

## 2017-01-28 DIAGNOSIS — N898 Other specified noninflammatory disorders of vagina: Secondary | ICD-10-CM | POA: Diagnosis not present

## 2017-01-28 DIAGNOSIS — Z01419 Encounter for gynecological examination (general) (routine) without abnormal findings: Secondary | ICD-10-CM

## 2017-01-28 DIAGNOSIS — Z1211 Encounter for screening for malignant neoplasm of colon: Secondary | ICD-10-CM

## 2017-01-28 DIAGNOSIS — Z01411 Encounter for gynecological examination (general) (routine) with abnormal findings: Secondary | ICD-10-CM

## 2017-01-28 DIAGNOSIS — N952 Postmenopausal atrophic vaginitis: Secondary | ICD-10-CM | POA: Diagnosis not present

## 2017-01-28 DIAGNOSIS — Z1212 Encounter for screening for malignant neoplasm of rectum: Secondary | ICD-10-CM

## 2017-01-28 LAB — HEMOCCULT GUIAC POC 1CARD (OFFICE): Fecal Occult Blood, POC: NEGATIVE

## 2017-01-28 MED ORDER — ESTROGENS, CONJUGATED 0.625 MG/GM VA CREA: TOPICAL_CREAM | VAGINAL | 0 refills | Status: DC

## 2017-01-28 NOTE — Progress Notes (Signed)
Patient ID: Hailey [name], female   DOB: 11/08/1940, 76 y.o.   MRN: [id number] History of Present Illness: Hailey [name] is a 76 year old white female in for gyn exam, complains of vaginal dryness.She had physical in June with Dr Reuel Boomaniel, but no breast,pelvic or rectal exam.She has had some skin lesions cauterized recently by Dr Michell HeinrichWentworth. PCP is Dr Reuel Boomaniel.   Current Medications, Allergies, Past Medical History, Past Surgical History, Family History and Social History were reviewed in [facility] electronic medical record.     Review of Systems: Patient denies any headaches, hearing loss, fatigue, blurred vision, shortness of breath,  abdominal pain, problems with bowel movements, urination, or intercourse(not having sex). No mood swings. Has pain in hips and knees at times and has chest on and off for years, with negative work up she says.  +vaginal dryness, requests refill on PVC.  Physical Exam:BP 130/82 (BP Location: Right Arm, Patient Position: Sitting, Cuff Size: Normal)   Pulse 66   Ht 5' 7.5" (1.715 m)   Wt 193 lb (87.5 kg)   BMI 29.78 kg/m  General:  Well developed, well nourished, no acute distress Skin:  Warm and dry Neck:  Midline trachea, normal thyroid, good ROM, no lymphadenopathy, no carotid bruits heard  Lungs; Clear to auscultation bilaterally Breast:  No dominant palpable mass, retraction, or nipple discharge, has irregular breast tissue bilaterally  Cardiovascular: Regular rate and rhythm Abdomen:  Soft, non tender, no hepatosplenomegaly Pelvic:  External genitalia is normal in appearance, no lesions.  The vagina is pale, and dry with loss of rugae. Urethra has no lesions or masses. The cervix and uterus is absent. No adnexal masses or tenderness noted.Bladder is non tender, no masses felt. Rectal: Good sphincter tone, no polyps, or hemorrhoids felt.  Hemoccult negative. Extremities/musculoskeletal:  No swelling or varicosities noted, no clubbing or cyanosis Psych:  No mood  changes, alert and cooperative,seems happy PHQ 2 score 0.  Impression: 1. Vaginal atrophy   2. Vaginal dryness   3. Screening for colorectal cancer   4. Well woman exam with routine gynecological exam       Plan: Physical in 2 years if desired Mammogram yearly Labs with PCP Given samples of Premarin vaginal cream, 16 gm use 0.5 gm 2-3 x a week in vagina at HS prn

## 2017-04-25 ENCOUNTER — Encounter (HOSPITAL_COMMUNITY): Payer: Self-pay | Admitting: Emergency Medicine

## 2017-04-25 ENCOUNTER — Emergency Department (HOSPITAL_COMMUNITY): Payer: Medicare Other

## 2017-04-25 DIAGNOSIS — I2510 Atherosclerotic heart disease of native coronary artery without angina pectoris: Secondary | ICD-10-CM | POA: Diagnosis not present

## 2017-04-25 DIAGNOSIS — I10 Essential (primary) hypertension: Secondary | ICD-10-CM | POA: Insufficient documentation

## 2017-04-25 DIAGNOSIS — R1013 Epigastric pain: Secondary | ICD-10-CM | POA: Diagnosis not present

## 2017-04-25 DIAGNOSIS — R61 Generalized hyperhidrosis: Secondary | ICD-10-CM | POA: Insufficient documentation

## 2017-04-25 DIAGNOSIS — R0789 Other chest pain: Secondary | ICD-10-CM | POA: Diagnosis not present

## 2017-04-25 DIAGNOSIS — Z79899 Other long term (current) drug therapy: Secondary | ICD-10-CM | POA: Insufficient documentation

## 2017-04-25 LAB — BASIC METABOLIC PANEL
Anion gap: 6 (ref 5–15)
BUN: 13 mg/dL (ref 6–20)
CALCIUM: 9.1 mg/dL (ref 8.9–10.3)
CO2: 25 mmol/L (ref 22–32)
CREATININE: 1.18 mg/dL — AB (ref 0.44–1.00)
Chloride: 108 mmol/L (ref 101–111)
GFR calc non Af Amer: 44 mL/min — ABNORMAL LOW (ref >60)
GFR, EST AFRICAN AMERICAN: 51 mL/min — AB (ref >60)
Glucose, Bld: 102 mg/dL — ABNORMAL HIGH (ref 65–99)
Potassium: 4.2 mmol/L (ref 3.5–5.1)
SODIUM: 139 mmol/L (ref 135–145)

## 2017-04-25 LAB — CBC
HCT: 39.6 % (ref 36.0–46.0)
Hemoglobin: 12.8 g/dL (ref 12.0–15.0)
MCH: 32.2 pg (ref 26.0–34.0)
MCHC: 32.3 g/dL (ref 30.0–36.0)
MCV: 99.7 fL (ref 78.0–100.0)
PLATELETS: 217 10*3/uL (ref 150–400)
RBC: 3.97 MIL/uL (ref 3.87–5.11)
RDW: 14.3 % (ref 11.5–15.5)
WBC: 6.2 10*3/uL (ref 4.0–10.5)

## 2017-04-25 LAB — I-STAT TROPONIN, ED: TROPONIN I, POC: 0.01 ng/mL (ref 0.00–0.08)

## 2017-04-25 NOTE — ED Triage Notes (Signed)
Pt states she is been having increase mid cp 5/10 since last night with diaphoresis, radiating to her left arm. Pt denies any nausea or vomiting, no sob.

## 2017-04-26 ENCOUNTER — Emergency Department (HOSPITAL_COMMUNITY)
Admission: EM | Admit: 2017-04-26 | Discharge: 2017-04-26 | Disposition: A | Discharge status: Home / Self Care | Payer: Medicare Other | Attending: Emergency Medicine | Admitting: Emergency Medicine

## 2017-04-26 DIAGNOSIS — R1013 Epigastric pain: Secondary | ICD-10-CM

## 2017-04-26 DIAGNOSIS — R0789 Other chest pain: Secondary | ICD-10-CM

## 2017-04-26 LAB — I-STAT TROPONIN, ED: Troponin i, poc: 0.01 ng/mL (ref 0.00–0.08)

## 2017-04-26 MED ORDER — GI COCKTAIL ~~LOC~~
30.0000 mL | Freq: Once | ORAL | Status: AC
  Administered 2017-04-26: 30 mL via ORAL
  Filled 2017-04-26: qty 30

## 2017-04-26 MED ORDER — FAMOTIDINE 20 MG PO TABS
20.0000 mg | ORAL_TABLET | Freq: Once | ORAL | Status: AC
  Administered 2017-04-26: 20 mg via ORAL
  Filled 2017-04-26: qty 1

## 2017-04-26 NOTE — Discharge Instructions (Signed)
Continue taking your Prilosec twice a day. Take ranitidine (Zantac) or famotidine (Pepcid) twice a day. Follow up with the gastroenterologist. Return if symptoms are getting worse.

## 2017-04-26 NOTE — ED Provider Notes (Signed)
[facility]-EMERGENCY DEPT Provider Note   CSN: [id number] Arrival date & time: 04/25/17  2258     History   Chief Complaint Chief Complaint  Patient presents with  . Chest Pain    HPI Hailey [name] is a 76 y.o. female.  The history is provided by the patient.  Chest Pain    She plan onset yesterday afternoon of a pain in the lower sternal area. Pain was sharp and she rated it at 4/10.she denied dyspnea, nausea. She did have diaphoresis. She had a similar pain 2 weeks ago for which she was evaluated at [facility] and was referred to cardiology for follow-up. She has an appointment set up for October 4. Nothing makes her pain better, nothing makes it worse. She does state she has had similar pains in the past, with no clear diagnosis. She has a history of hypertension, but no history of diabetes or hyperlipidemia and she is a nonsmoker.  Past Medical History:  Diagnosis Date  . Breast nodule 11/16/2013  . H/O hematuria   . HLD (hyperlipidemia)   . Hypertension   . 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 Palpitations   . Restless leg syndrome   . Vaginal atrophy     Patient Active Problem List   Diagnosis Date Noted  . Vaginal dryness 01/28/2017  . Vaginal atrophy 01/28/2017  . Breast nodule 11/16/2013  . Breast mass, right 12/22/2012  . OVERWEIGHT 04/13/2009  . GENERALIZED ANXIETY DISORDER 04/13/2009  . CORONARY ARTERY DISEASE 04/13/2009  . PALPITATIONS 04/03/2009    Past Surgical History:  Procedure Laterality Date  . APPENDECTOMY    . CARDIAC CATHETERIZATION  2005    cone  . COLONOSCOPY    . HERNIA REPAIR    . LEFT HEART CATHETERIZATION WITH CORONARY ANGIOGRAM N/A 04/12/2012   Procedure: LEFT HEART CATHETERIZATION WITH CORONARY ANGIOGRAM;  Surgeon: Kathleene Hazel, MD;  Location: [facility] CATH LAB;  Service: Cardiovascular;  Laterality: N/A;  . TOTAL ABDOMINAL HYSTERECTOMY      OB History    Gravida Para Term Preterm AB Living   1       1 0   SAB  TAB Ectopic Multiple Live Births   1               Home Medications    Prior to Admission medications   Medication Sig Start Date End Date Taking? Authorizing Provider  Acetaminophen (TYLENOL ARTHRITIS PAIN PO) Take by mouth 2 (two) times daily.    [provider]  Calcium Carbonate-Vit D-Min 600-400 MG-UNIT TABS Take by mouth daily.     [provider]  clonazepam (KLONOPIN) 0.125 MG disintegrating tablet Take 0.125 mg by mouth at bedtime. 01/12/15   [provider]  conjugated estrogens (PREMARIN) vaginal cream Use as directed,0.5 mg 2-3 weekly in vagina at [facility] 01/28/17   Adline Potter, NP  fluticasone (FLONASE) 50 MCG/ACT nasal spray INSTILL 1 SPRAY IN EACH NOSTRIL ONCE DAILY PRN 01/18/15   [provider]  loratadine (CLARITIN) 10 MG tablet Take 10 mg by mouth daily.    [provider]  metoprolol tartrate (LOPRESSOR) 25 MG tablet Take by mouth. Takes 37.5 mg BID    [provider]  Multiple Vitamins-Minerals (CENTRUM SILVER) tablet Take 1 tablet by mouth daily.      [provider]  omeprazole (PRILOSEC) 20 MG capsule Take 20 mg by mouth 2 (two) times daily.    [provider]  Family History Family History  Problem Relation Age of Onset  . Coronary artery disease Father        mid 75s  . Hypertension Mother   . Stroke Brother   . Transient ischemic attack Brother   . Other Brother        on oxygen  . Hypertension Brother   . Stroke Brother   . Heart disease Brother   . Heart attack Brother   . Other Sister        a fib, restless leg  . Other Sister        pressure in eyes    Social History Social History  Substance Use Topics  . Smoking status: Never Smoker  . Smokeless tobacco: Never Used  . Alcohol use Yes     Comment: rarely; glass of wine with dinner     Allergies   Flomax [tamsulosin hcl]   Review of Systems Review of Systems  Cardiovascular: Positive for chest pain.  All  other systems reviewed and are negative.    Physical Exam Updated Vital Signs BP (!) 169/84   Pulse 90   Temp (!) 97.4 F (36.3 C) (Oral)   Resp 13   Ht  (1.702 m)   Wt 87.5 kg (193 lb)   SpO2 100%   BMI 30.23 kg/m   Physical Exam  Nursing note and vitals reviewed.  75 year old female, resting comfortably and in no acute distress. Vital signs are significant for hypertension. Oxygen saturation is 100%, which is normal. Head is normocephalic and atraumatic. PERRLA, EOMI. Oropharynx is clear. Neck is nontender and supple without adenopathy or JVD. Back is nontender and there is no CVA tenderness. Lungs are clear without rales, wheezes, or rhonchi. Chest is moderately tender over the mid and lower sternal area. Heart has regular rate and rhythm without murmur. Abdomen is soft, flat, with moderate epigastric tenderness. There are no masses or hepatosplenomegaly and peristalsis is normoactive. Extremities have 1+ edema, full range of motion is present. Skin is warm and dry without rash. Neurologic: Mental status is normal, cranial nerves are intact, there are no motor or sensory deficits.  ED Treatments / Results  Labs (all labs ordered are listed, but only abnormal results are displayed) Labs Reviewed  BASIC METABOLIC PANEL - Abnormal; Notable for the following:       Result Value   Glucose, Bld 102 (*)    Creatinine, Ser 1.18 (*)    GFR calc non Af Amer 44 (*)    GFR calc Af Amer 51 (*)    All other components within normal limits  CBC  I-STAT TROPONIN, ED  I-STAT TROPONIN, ED    EKG  EKG Interpretation  Date/Time:  Saturday April 25 2017 23:02:03 EDT Ventricular Rate:  52 PR Interval:  166 QRS Duration: 80 QT Interval:  472 QTC Calculation: 438 R Axis:   48 Text Interpretation:  Sinus bradycardia Low voltage QRS T wave abnormality, consider anterior ischemia Abnormal ECG When compared with ECG of 04/12/2012, No significant change was found Confirmed by  Dione Booze ([id number]) on 04/26/2017 12:03:47 AM       Radiology Dg Chest 2 View  Result Date: 04/25/2017 CLINICAL DATA:  Chest pain radiating to the left axillary region since yesterday. Similar symptoms 2 weeks ago for which she was hospitalized. Hypertension. Nonsmoker. Cough and congestion for 1 year. EXAM: CHEST  2 VIEW COMPARISON:  04/14/2017 FINDINGS: Normal heart size and pulmonary vascularity. No focal airspace disease  or consolidation in the lungs. No blunting of costophrenic angles. No pneumothorax. Mediastinal contours appear intact. IMPRESSION: No active cardiopulmonary disease. Electronically Signed   By: Burman Nieves M.D.   On: 04/25/2017 23:40    Procedures Procedures (including critical care time)  Medications Ordered in ED Medications - No data to display   Initial Impression / Assessment and Plan / ED Course  I have reviewed the triage vital signs and the nursing notes.  Pertinent labs & imaging results that were available during my care of the patient were reviewed by me and considered in my medical decision making (see chart for details).  Atypical chest pain with recent negative evaluation at [facility]. Old records are reviewed, and she had a cardiac catheterization 5 years ago which showed minimal coronary artery disease with less than 20% obstruction of the LAD and circumflex. Her symptoms are quite atypical, I suspect GI origin. She will be given a GI cocktail and response assessed.  She had excellent relief of symptoms with GI cocktail. She is given oral famotidine. Repeat troponin is normal. She tells me that she has been taking omeprazole twice a day for a long time. When tendons flareup, she takes it 3 or 4 times a day. I feel fairly comfortable that her symptoms are GI origin. She is advised to only take her omeprazole twice a day, but advised to add an H2 blocker twice a day. She is referred to gastroenterology for follow-up.  Final Clinical  Impressions(s) / ED Diagnoses   Final diagnoses:  Atypical chest pain  Epigastric pain    New Prescriptions Current Discharge Medication List       Dione Booze, MD 04/26/17 [phone]

## 2017-04-27 ENCOUNTER — Other Ambulatory Visit: Payer: Self-pay | Admitting: Cardiology

## 2017-04-27 DIAGNOSIS — R0789 Other chest pain: Secondary | ICD-10-CM

## 2017-05-06 ENCOUNTER — Inpatient Hospital Stay (HOSPITAL_COMMUNITY): Admission: RE | Admit: 2017-05-06 | Payer: Medicare Other | Source: Ambulatory Visit

## 2017-05-07 ENCOUNTER — Encounter: Payer: Self-pay | Admitting: Cardiovascular Disease

## 2017-05-07 ENCOUNTER — Ambulatory Visit (INDEPENDENT_AMBULATORY_CARE_PROVIDER_SITE_OTHER): Payer: Medicare Other | Admitting: Cardiovascular Disease

## 2017-05-07 ENCOUNTER — Encounter: Payer: Self-pay | Admitting: *Deleted

## 2017-05-07 VITALS — BP 108/64 | HR 48 | Ht 67.5 in | Wt 193.0 lb

## 2017-05-07 DIAGNOSIS — I4891 Unspecified atrial fibrillation: Secondary | ICD-10-CM | POA: Diagnosis not present

## 2017-05-07 DIAGNOSIS — R0609 Other forms of dyspnea: Secondary | ICD-10-CM

## 2017-05-07 DIAGNOSIS — Z9289 Personal history of other medical treatment: Secondary | ICD-10-CM

## 2017-05-07 DIAGNOSIS — R002 Palpitations: Secondary | ICD-10-CM | POA: Diagnosis not present

## 2017-05-07 DIAGNOSIS — R079 Chest pain, unspecified: Secondary | ICD-10-CM

## 2017-05-07 NOTE — Patient Instructions (Signed)
Medication Instructions:  Continue all current medications.  Labwork: none  Testing/Procedures:  Your physician has requested that you have an echocardiogram. Echocardiography is a painless test that uses sound waves to create images of your heart. It provides your doctor with information about the size and shape of your heart and how well your heart's chambers and valves are working. This procedure takes approximately one hour. There are no restrictions for this procedure.  Your physician has recommended that you wear a 30 day event monitor. Event monitors are medical devices that record the heart's electrical activity. Doctors most often us these monitors to diagnose arrhythmias. Arrhythmias are problems with the speed or rhythm of the heartbeat. The monitor is a small, portable device. You can wear one while you do your normal daily activities. This is usually used to diagnose what is causing palpitations/syncope (passing out).  Office will contact with results via phone or letter.    Follow-Up: 2 months   Any Other Special Instructions Will Be Listed Below (If Applicable).  If you need a refill on your cardiac medications before your next appointment, please call your pharmacy.  

## 2017-05-07 NOTE — Progress Notes (Addendum)
CARDIOLOGY CONSULT NOTE  Patient ID: Hailey [name] MRN: [id number] DOB/AGE: 1940/08/07 76 y.o.  Admit date: (Not on file) Primary Physician: Richardean Chimera, MD Referring Physician: Dr. Reuel Boom  Reason for Consultation: chest pain  HPI: Hailey [name] is a 76 y.o. female who is being seen today for the evaluation of chest pain at the request of Richardean Chimera, MD.   Coronary angiography on 04/12/12 demonstrated very minor nonobstructive coronary artery disease with normal left ventricular function. There was 10-20% mid vessel irregularities in the LAD, 20% proximal left circumflex stenosis, and LVEF 55-65%.  This was performed by Dr. [place].  She was recently hospitalized at [facility] in early September for chest pain. It was felt secondary to accelerated hypertension. I reviewed all relevant documentation, labs, and studies. She ruled out for an acute coronary syndrome. Chest x-ray showed no acute changes. CBC was normal. D-dimer was normal.  She was then evaluated for chest pain at [facility] on 04/26/17.  Labs showed CKD stage III. CBC and troponins were normal. Chest x-ray showed no active cardiopulmonary disease.  ECG which I personally interpreted demonstrated sinus bradycardia, 52 bpm, with diffuse nonspecific T wave abnormalities.  When I began talking to her, she told me that she was diagnosed with atrial fibrillation at [facility] and was prescribed Eliquis by Dr. Reuel Boom. She was told she was going in and out of atrial fibrillation by a Dr. Jeri Lager.  Unfortunately, I have no documentation of this. I have no labs from [facility] nor EKG's supporting the diagnosis of atrial fibrillation, nor office notes mentioning this. She is indeed on Eliquis.  For the past 3 months she has been having some exertional dyspnea, fatigue, and generalized malaise. She has been having episodic palpitations which awake her from sleep.  She has been on a stable dose of  metoprolol for many years.     Allergies  Allergen Reactions  . Flomax [Tamsulosin Hcl] Nausea And Vomiting    Current Outpatient Prescriptions  Medication Sig Dispense Refill  . acetaminophen (TYLENOL) 650 MG CR tablet Take 650-1,300 mg by mouth See admin instructions. Take 2 tablets every morning and take 1 tablet at night    . apixaban (ELIQUIS) 5 MG TABS tablet Take 5 mg by mouth 2 (two) times daily.    . Calcium Carbonate-Vit D-Min 600-400 MG-UNIT TABS Take 1 tablet by mouth daily.     . clonazepam (KLONOPIN) 0.125 MG disintegrating tablet Take 0.125 mg by mouth at bedtime.  5  . metoprolol tartrate (LOPRESSOR) 25 MG tablet Take 37.5 mg by mouth 2 (two) times daily.     . Multiple Vitamins-Minerals (CENTRUM SILVER) tablet Take 1 tablet by mouth daily.      Marland Kitchen omeprazole (PRILOSEC) 20 MG capsule Take 20 mg by mouth 2 (two) times daily.     No current facility-administered medications for this visit.     Past Medical History:  Diagnosis Date  . Breast nodule 11/16/2013  . H/O hematuria   . HLD (hyperlipidemia)   . Hypertension   . 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 Palpitations   . Restless leg syndrome   . Vaginal atrophy     Past Surgical History:  Procedure Laterality Date  . APPENDECTOMY    . CARDIAC CATHETERIZATION  2005    cone  . COLONOSCOPY    . HERNIA REPAIR    . LEFT HEART CATHETERIZATION WITH CORONARY  ANGIOGRAM N/A 04/12/2012   Procedure: LEFT HEART CATHETERIZATION WITH CORONARY ANGIOGRAM;  Surgeon: Kathleene Hazel, MD;  Location: [facility] CATH LAB;  Service: Cardiovascular;  Laterality: N/A;  . TOTAL ABDOMINAL HYSTERECTOMY      Social History   Social History  . Marital status: Single    Spouse name: N/A  . Number of children: 0  . Years of education: College   Occupational History  . retired    Social History Main Topics  . Smoking status: Never Smoker  . Smokeless tobacco: Never Used  . Alcohol use Yes     Comment: rarely; glass of  wine with dinner  . Drug use: No  . Sexual activity: No     Comment: hyst   Other Topics Concern  . Not on file   Social History Narrative   Single. Retired.    Patient lives at home alone.   Caffeine Use: none     No family history of premature CAD in 1st degree relatives.  Current Meds  Medication Sig  . acetaminophen (TYLENOL) 650 MG CR tablet Take 650-1,300 mg by mouth See admin instructions. Take 2 tablets every morning and take 1 tablet at night  . apixaban (ELIQUIS) 5 MG TABS tablet Take 5 mg by mouth 2 (two) times daily.  . Calcium Carbonate-Vit D-Min 600-400 MG-UNIT TABS Take 1 tablet by mouth daily.   . clonazepam (KLONOPIN) 0.125 MG disintegrating tablet Take 0.125 mg by mouth at bedtime.  . metoprolol tartrate (LOPRESSOR) 25 MG tablet Take 37.5 mg by mouth 2 (two) times daily.   . Multiple Vitamins-Minerals (CENTRUM SILVER) tablet Take 1 tablet by mouth daily.    Marland Kitchen omeprazole (PRILOSEC) 20 MG capsule Take 20 mg by mouth 2 (two) times daily.      Review of systems complete and found to be negative unless listed above in HPI    Physical exam Blood pressure 108/64, pulse (!) 48, height 5' 7.5" (1.715 m), weight 193 lb (87.5 kg), SpO2 98 %. General: NAD Neck: No JVD, no thyromegaly or thyroid nodule.  Lungs: Clear to auscultation bilaterally with normal respiratory effort. CV: Nondisplaced PMI. Bradycardic, regular rhythm, normal S1/S2, no S3/S4, no murmur.  No peripheral edema.  No carotid bruit.    Abdomen: Soft, nontender, no distention.  Skin: Intact without lesions or rashes.  Neurologic: Alert and oriented x 3.  Psych: Normal affect. Extremities: No clubbing or cyanosis.  HEENT: Normal.   ECG: Most recent ECG reviewed.   Labs: Lab Results  Component Value Date/Time   K 4.2 04/25/2017 11:06 PM   BUN 13 04/25/2017 11:06 PM   BUN 16 10/21/2013 01:10 PM   CREATININE 1.18 (H) 04/25/2017 11:06 PM   ALT 20 10/21/2013 01:10 PM   TSH 3.010 10/21/2013  01:10 PM   HGB 12.8 04/25/2017 11:06 PM     Lipids: No results found for: LDLCALC, LDLDIRECT, CHOL, TRIG, HDL      ASSESSMENT AND PLAN:  1. Atrial fibrillation with palpitations and exertional dyspnea: As stated above, I have no documentation supporting this nor EKGs demonstrating this. I have spoken with my staff and am in the process of requesting EKGs, labs, and documentation supporting this diagnosis. For the time being, she should continue Eliquis for thromboembolic risk reduction if she indeed has atrial fibrillation. I will obtain a 30 day event monitor. I will also obtain an echocardiogram to assess left atrial size as well as to assess cardiac structure and function.  2. Chest pain:  This appears to be chronic. Studies done during 2 hospitalizations were unremarkable. I may consider stress testing in the future. Continue metoprolol at present dose for now.     Disposition: Follow up in 2 months  Signed: Prentice Docker, M.D., F.A.C.C.  05/07/2017, 10:36 AM   ADDENDUM: I was able to obtain records from [facility] which confirmed the presence of atrial fibrillation. I personally reviewed the ECG demonstrating this.

## 2017-05-13 ENCOUNTER — Ambulatory Visit (INDEPENDENT_AMBULATORY_CARE_PROVIDER_SITE_OTHER): Payer: Medicare Other

## 2017-05-13 DIAGNOSIS — I4891 Unspecified atrial fibrillation: Secondary | ICD-10-CM

## 2017-05-20 ENCOUNTER — Other Ambulatory Visit: Payer: Self-pay

## 2017-05-20 ENCOUNTER — Ambulatory Visit (INDEPENDENT_AMBULATORY_CARE_PROVIDER_SITE_OTHER): Payer: Medicare Other

## 2017-05-20 DIAGNOSIS — I4891 Unspecified atrial fibrillation: Secondary | ICD-10-CM | POA: Diagnosis not present

## 2017-05-26 ENCOUNTER — Telehealth: Payer: Self-pay | Admitting: *Deleted

## 2017-05-26 NOTE — Telephone Encounter (Signed)
Notes recorded by Lesle ChrisHill, Robyne Matar G, LPN on 16/10/960410/23/2018 at 11:00 AM EDT Patient notified. Copy to pmd. Follow up scheduled for 07/21/2017 with Dr. Purvis SheffieldKoneswaran. ------  Notes recorded by Lesle ChrisHill, Brittinee Risk G, LPN on 54/09/811910/19/2018 at 5:31 PM EDT No answer.  ------  Notes recorded by Laqueta LindenKoneswaran, Suresh A, MD on 05/21/2017 at 7:44 AM EDT Cardiac function is normal. Moderate mitral valve leakage. I will monitor.

## 2017-06-13 ENCOUNTER — Telehealth: Payer: Self-pay | Admitting: Nurse Practitioner

## 2017-06-13 NOTE — Telephone Encounter (Signed)
   Received call from [employer] that pt had a run of Afib into the 170's.  This resolved on its own.  I spoke with pt and she did experience sudden onset of fatigue, lightheadedness, and dyspnea while working out in the yard earlier.  This has since subsided.  She has been compliant with  blocker and eliquis.  I advised that we now know for certain that she was previously experiencing Afib (admit @ [facility]).  She should cont current meds and Dr. Purvis SheffieldKoneswaran will review strips next week and determine if titration of metoprolol, addition of another medication, or antiarrhythmic therapy is most appropriate.  Caller verbalized understanding and was grateful for the call back.  Nicolasa Duckinghristopher Isak Sotomayor, NP 06/13/2017, 3:37 PM

## 2017-06-15 NOTE — Telephone Encounter (Signed)
I have faxed event monitor reports available to [place] office.

## 2017-06-15 NOTE — Telephone Encounter (Signed)
Can we find out what average resting heartrate (when in sinus rhythm) has been thus far? I want to see if beta blocker dose can be increased vs initiating antiarrhythmic therapy.

## 2017-06-16 NOTE — Telephone Encounter (Signed)
Has this been addressed by staff in the [place] office.

## 2017-06-16 NOTE — Telephone Encounter (Signed)
Monitor faxed. I will review.

## 2017-06-22 ENCOUNTER — Telehealth: Payer: Self-pay | Admitting: *Deleted

## 2017-06-22 DIAGNOSIS — R079 Chest pain, unspecified: Secondary | ICD-10-CM

## 2017-06-22 MED ORDER — AMIODARONE HCL 200 MG PO TABS: 200.0000 mg | ORAL_TABLET | Freq: Two times a day (BID) | ORAL | 0 refills | Status: DC

## 2017-06-22 MED ORDER — AMIODARONE HCL 200 MG PO TABS: 200.0000 mg | ORAL_TABLET | Freq: Every day | ORAL | 6 refills | Status: DC

## 2017-06-22 NOTE — Telephone Encounter (Signed)
Patient notified.  During this conversation, she reports that she has been having chest pains all weekend long.  Today rating her pain 5-6/10.  No dizziness or SOB.  Does not feel like she is in AFib now.  Does notice slight nausea.  Stated over the weekend she was up a lot preparing food.  Stated pain was a lot worse over the weekend with nausea & weakness.  Did not go to ED as she just contributed this to part of the AFib.  Stated that her pmd is following her for some thyroid nodules x the last year.  Wanted to make sure this would not interfere with taking the Amiodarone.  Informed that we would be checking thyroid & liver function labs in about a month to monitor this.  Patient is also concerned that the leaky valve could be causing this.

## 2017-06-22 NOTE — Telephone Encounter (Signed)
Notes recorded by Lesle ChrisHill, Angela G, LPN on 82/95/621311/19/2018 at 10:13 AM EST Left message to return call.  ------  Notes recorded by Laqueta LindenKoneswaran, Suresh A, MD on 06/19/2017 at 3:56 PM EST Stop metoprolol. Start amiodarone 200 mg bid x 2 weeks, then 200 mg daily. Check TSH and LFT's in one month.

## 2017-06-22 NOTE — Telephone Encounter (Signed)
Chest pain not due to mitral valve leakage. Please obtain Lexiscan.

## 2017-06-22 NOTE — Telephone Encounter (Signed)
Patient notified.  She agrees to do the [place] stress test.  Will put in order & fwd for scheduling.  Instructions also given.  Follow up 07/21/2017.

## 2017-06-23 ENCOUNTER — Encounter: Payer: Self-pay | Admitting: *Deleted

## 2017-06-23 ENCOUNTER — Telehealth: Payer: Self-pay | Admitting: Cardiovascular Disease

## 2017-06-23 NOTE — Telephone Encounter (Signed)
Pre-cert Verification for the following procedure   Lexiscan stress test scheduled for 07-03-17

## 2017-06-24 ENCOUNTER — Telehealth: Payer: Self-pay | Admitting: Cardiovascular Disease

## 2017-06-24 NOTE — Telephone Encounter (Signed)
Patient is confused about medication.  Stated that pharmacy told her that it is on hold

## 2017-06-24 NOTE — Telephone Encounter (Signed)
Unable to leave message

## 2017-06-24 NOTE — Telephone Encounter (Signed)
Returned call to patient.  Explained to patient that she will have to return to pharm to pick up next prescription (Amiodarone).  Insurance would not allow her to pick up both on same day.  She verbalized understanding.

## 2017-06-25 ENCOUNTER — Emergency Department (HOSPITAL_COMMUNITY)
Admission: EM | Admit: 2017-06-25 | Discharge: 2017-06-25 | Disposition: A | Discharge status: Home / Self Care | Payer: Medicare Other | Attending: Emergency Medicine | Admitting: Emergency Medicine

## 2017-06-25 ENCOUNTER — Other Ambulatory Visit: Payer: Self-pay

## 2017-06-25 ENCOUNTER — Encounter (HOSPITAL_COMMUNITY): Payer: Self-pay | Admitting: *Deleted

## 2017-06-25 DIAGNOSIS — I259 Chronic ischemic heart disease, unspecified: Secondary | ICD-10-CM | POA: Diagnosis not present

## 2017-06-25 DIAGNOSIS — I4891 Unspecified atrial fibrillation: Secondary | ICD-10-CM | POA: Diagnosis not present

## 2017-06-25 DIAGNOSIS — Z79899 Other long term (current) drug therapy: Secondary | ICD-10-CM | POA: Insufficient documentation

## 2017-06-25 DIAGNOSIS — I10 Essential (primary) hypertension: Secondary | ICD-10-CM | POA: Diagnosis not present

## 2017-06-25 DIAGNOSIS — R002 Palpitations: Secondary | ICD-10-CM | POA: Insufficient documentation

## 2017-06-25 DIAGNOSIS — Z7901 Long term (current) use of anticoagulants: Secondary | ICD-10-CM | POA: Diagnosis not present

## 2017-06-25 LAB — TROPONIN I

## 2017-06-25 LAB — COMPREHENSIVE METABOLIC PANEL
ALBUMIN: 3.7 g/dL (ref 3.5–5.0)
ALT: 17 U/L (ref 14–54)
AST: 25 U/L (ref 15–41)
Alkaline Phosphatase: 68 U/L (ref 38–126)
Anion gap: 7 (ref 5–15)
BILIRUBIN TOTAL: 0.6 mg/dL (ref 0.3–1.2)
BUN: 13 mg/dL (ref 6–20)
CALCIUM: 9.2 mg/dL (ref 8.9–10.3)
CO2: 29 mmol/L (ref 22–32)
CREATININE: 1.17 mg/dL — AB (ref 0.44–1.00)
Chloride: 104 mmol/L (ref 101–111)
GFR calc Af Amer: 51 mL/min — ABNORMAL LOW (ref >60)
GFR calc non Af Amer: 44 mL/min — ABNORMAL LOW (ref >60)
GLUCOSE: 97 mg/dL (ref 65–99)
POTASSIUM: 4.2 mmol/L (ref 3.5–5.1)
Sodium: 140 mmol/L (ref 135–145)
TOTAL PROTEIN: 6.4 g/dL — AB (ref 6.5–8.1)

## 2017-06-25 LAB — CBC WITH DIFFERENTIAL/PLATELET
BASOS ABS: 0 10*3/uL (ref 0.0–0.1)
BASOS PCT: 0 %
Eosinophils Absolute: 0.1 10*3/uL (ref 0.0–0.7)
Eosinophils Relative: 2 %
HEMATOCRIT: 42.2 % (ref 36.0–46.0)
HEMOGLOBIN: 13.3 g/dL (ref 12.0–15.0)
LYMPHS PCT: 43 %
Lymphs Abs: 2 10*3/uL (ref 0.7–4.0)
MCH: 32.4 pg (ref 26.0–34.0)
MCHC: 31.5 g/dL (ref 30.0–36.0)
MCV: 102.7 fL — AB (ref 78.0–100.0)
MONO ABS: 0.4 10*3/uL (ref 0.1–1.0)
Monocytes Relative: 9 %
NEUTROS ABS: 2.1 10*3/uL (ref 1.7–7.7)
NEUTROS PCT: 46 %
Platelets: 214 10*3/uL (ref 150–400)
RBC: 4.11 MIL/uL (ref 3.87–5.11)
RDW: 13.6 % (ref 11.5–15.5)
WBC: 4.6 10*3/uL (ref 4.0–10.5)

## 2017-06-25 MED ORDER — GI COCKTAIL ~~LOC~~
30.0000 mL | Freq: Once | ORAL | Status: AC
  Administered 2017-06-25: 30 mL via ORAL
  Filled 2017-06-25: qty 30

## 2017-06-25 NOTE — ED Provider Notes (Signed)
[facility] EMERGENCY DEPARTMENT Provider Note   CSN: [id number] Arrival date & time: 06/25/17  0419     History   Chief Complaint Chief Complaint  Patient presents with  . Palpitations    HPI Hailey [name] is a 76 y.o. female.  Patient presents by EMS after being woken up with sensation of palpitations and heart racing.  She states her "heart was flip flopping all over my chest and it felt like Jell-O".  She states the symptoms lasted for a few minutes and are now resolved.  Patient states she checked her blood pressure at home which was 120 systolic and her heart rate was in the 70s and 80s. EMS reports she was in sinus rhythm on their arrival.  She denies any chest pain or shortness of breath.  Patient has a history of atrial fibrillation and is on Eliquis.  This atrial fibrillation was confirmed on recent cardiac monitor.  She was switched from metoprolol to amiodarone 2 days ago by her cardiologist for better control of palpitations by her report..  She feels back to baseline now.  No dizziness or lightheadedness.  No chest pain or shortness of breath.  No nausea vomiting or diarrhea.   The history is provided by the patient and the EMS personnel.  Palpitations   Pertinent negatives include no fever, no chest pain, no abdominal pain, no nausea, no vomiting, no headaches, no dizziness, no weakness, no cough and no shortness of breath.    Past Medical History:  Diagnosis Date  . Breast nodule 11/16/2013  . H/O hematuria   . HLD (hyperlipidemia)   . Hypertension   . 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 Palpitations   . Restless leg syndrome   . Vaginal atrophy     Patient Active Problem List   Diagnosis Date Noted  . Vaginal dryness 01/28/2017  . Vaginal atrophy 01/28/2017  . Breast nodule 11/16/2013  . Breast mass, right 12/22/2012  . OVERWEIGHT 04/13/2009  . GENERALIZED ANXIETY DISORDER 04/13/2009  . CORONARY ARTERY DISEASE 04/13/2009  . PALPITATIONS  04/03/2009    Past Surgical History:  Procedure Laterality Date  . APPENDECTOMY    . CARDIAC CATHETERIZATION  2005    cone  . COLONOSCOPY    . HERNIA REPAIR    . LEFT HEART CATHETERIZATION WITH CORONARY ANGIOGRAM N/A 04/12/2012   Procedure: LEFT HEART CATHETERIZATION WITH CORONARY ANGIOGRAM;  Surgeon: Kathleene Hazelhristopher D McAlhany, MD;  [facility];  Service: Cardiovascular;  Laterality: N/A;  . TOTAL ABDOMINAL HYSTERECTOMY      OB History    Gravida Para Term Preterm AB Living   1       1 0   SAB TAB Ectopic Multiple Live Births   1               Home Medications    Prior to Admission medications   Medication Sig Start Date End Date Taking? Authorizing Provider  acetaminophen (TYLENOL) 650 MG CR tablet Take 650-1,300 mg by mouth See admin instructions. Take 2 tablets every morning and take 1 tablet at night    [provider]  amiodarone (PACERONE) 200 MG tablet Take 1 tablet (200 mg total) daily by mouth. 06/22/17 07/22/17  Laqueta LindenKoneswaran, Suresh A, MD  apixaban (ELIQUIS) 5 MG TABS tablet Take 5 mg by mouth 2 (two) times daily.    [provider]  Calcium Carbonate-Vit D-Min 600-400 MG-UNIT TABS Take 1 tablet by mouth daily.  [provider]  clonazepam (KLONOPIN) 0.125 MG disintegrating tablet Take 0.125 mg by mouth at bedtime. 01/12/15   [provider]  Multiple Vitamins-Minerals (CENTRUM SILVER) tablet Take 1 tablet by mouth daily.      [provider]  omeprazole (PRILOSEC) 20 MG capsule Take 20 mg by mouth 2 (two) times daily.    [provider]    Family History Family History  Problem Relation Age of Onset  . Coronary artery disease Father        mid 55s  . Hypertension Mother   . Stroke Brother   . Transient ischemic attack Brother   . Other Brother        on oxygen  . Hypertension Brother   . Stroke Brother   . Heart disease Brother   . Heart attack Brother   . Other Sister        a fib, restless leg    . Other Sister        pressure in eyes    Social History Social History   Tobacco Use  . Smoking status: Never Smoker  . Smokeless tobacco: Never Used  Substance Use Topics  . Alcohol use: Yes    Comment: rarely; glass of wine with dinner  . Drug use: No     Allergies   Flomax [tamsulosin hcl]   Review of Systems Review of Systems  Constitutional: Negative for activity change, appetite change and fever.  HENT: Negative for congestion and rhinorrhea.   Respiratory: Negative for cough, chest tightness and shortness of breath.   Cardiovascular: Positive for palpitations. Negative for chest pain.  Gastrointestinal: Negative for abdominal pain, nausea and vomiting.  Genitourinary: Negative for dysuria, hematuria, vaginal bleeding and vaginal discharge.  Musculoskeletal: Negative for arthralgias and myalgias.  Skin: Negative for rash.  Neurological: Negative for dizziness, weakness and headaches.    all other systems are negative except as noted in the HPI and PMH.    Physical Exam Updated Vital Signs BP (!) 181/90 (BP Location: Left Arm)   Pulse 73   Temp (!) 97.4 F (36.3 C) (Oral)   Resp 16   Ht 5\' 7"  (1.702 m)   Wt 85.3 kg (188 lb)   SpO2 99%   BMI 29.44 kg/m   Physical Exam  Constitutional: She is oriented to person, place, and time. She appears well-developed and well-nourished. No distress.  HENT:  Head: Normocephalic and atraumatic.  Mouth/Throat: Oropharynx is clear and moist. No oropharyngeal exudate.  Eyes: Conjunctivae and EOM are normal. Pupils are equal, round, and reactive to light.  Neck: Normal range of motion. Neck supple.  No meningismus.  Cardiovascular: Normal rate, regular rhythm, normal heart sounds and intact distal pulses. Exam reveals no gallop.  No murmur heard. Pulmonary/Chest: Effort normal and breath sounds normal. No respiratory distress.  Abdominal: Soft. There is no tenderness. There is no rebound and no guarding.   Musculoskeletal: Normal range of motion. She exhibits no edema or tenderness.  Neurological: She is alert and oriented to person, place, and time. No cranial nerve deficit. She exhibits normal muscle tone. Coordination normal.   5/5 strength throughout. CN 2-12 intact.Equal grip strength.   Skin: Skin is warm.  Psychiatric: She has a normal mood and affect. Her behavior is normal.  Nursing note and vitals reviewed.    ED Treatments / Results  Labs (all labs ordered are listed, but only abnormal results are displayed) Labs Reviewed  CBC WITH DIFFERENTIAL/PLATELET - Abnormal; Notable for  the following components:      Result Value   MCV 102.7 (*)    All other components within normal limits  COMPREHENSIVE METABOLIC PANEL - Abnormal; Notable for the following components:   Creatinine, Ser 1.17 (*)    Total Protein 6.4 (*)    GFR calc non Af Amer 44 (*)    GFR calc Af Amer 51 (*)    All other components within normal limits  TROPONIN I  TROPONIN I    EKG  EKG Interpretation  Date/Time:  Thursday June 25 2017 07:25:13 EST Ventricular Rate:  87 PR Interval:    QRS Duration: 103 QT Interval:  382 QTC Calculation: 460 R Axis:   79 Text Interpretation:  Sinus rhythm Low voltage, precordial leads RSR' in V1 or V2, right VCD or RVH No significant change was found Confirmed by Glynn Octaveancour, Asencion Guisinger [phone]([id number]) on 06/25/2017 7:29:30 AM       Radiology No results found.  Procedures Procedures (including critical care time)  Medications Ordered in ED Medications - No data to display   Initial Impression / Assessment and Plan / ED Course  I have reviewed the triage vital signs and the nursing notes.  Pertinent labs & imaging results that were available during my care of the patient were reviewed by me and considered in my medical decision making (see chart for details).    Patient with history of atrial fibrillation presenting with palpitations and racing heart that woke her  from sleep.  She has sinus rhythm on arrival.  No chest pain or shortness of breath.  EKG shows nonspecific T wave inversions stable to previous  Labs are reassuring.  Patient has maintained sinus rhythm throughout her ED stay.  Suspect she is having paroxysmal atrial fibrillation though she has maintained sinus rhythm since she has been in the ED.  She has only been on amiodarone for the past 2 days.  Patient developed left axillary pain that lasted for several minutes while she was waiting in the ED.  Troponin is negative. Low suspicion for ACS.  Patient with reassuring catheterization in 2013.  Will check second troponin. Care transferred at shift change to Dr. Juleen ChinaKohut. Anticipate discharge home if reassuring.  Final Clinical Impressions(s) / ED Diagnoses   Final diagnoses:  Palpitations    ED Discharge Orders    None       Amaurie Schreckengost, Jeannett SeniorStephen, MD 06/25/17 [phone]

## 2017-06-25 NOTE — ED Notes (Signed)
Pt ambulated to bathroom with some assistance and back to room to bed; pt currently c/o left side chest pain; Dr. Manus Gunningancour made aware

## 2017-06-25 NOTE — ED Triage Notes (Signed)
Pt brought in by [facility] for c/o feeling like heart palpitations; pt started on amiodarone x 3 days from metoprolol; pt denies any chest pain; pt states this episode started x 2 hours ago; pt was recently diagnosed with afib

## 2017-06-25 NOTE — ED Notes (Signed)
ED Provider at bedside. 

## 2017-06-25 NOTE — ED Notes (Signed)
Pt's heart rate in the 140's. Pt c/o heart palpitations. Heart rate returned to normal (80's) within about 15-20 seconds. EKG done. Dr. Manus Gunningancour notified.

## 2017-06-25 NOTE — Discharge Instructions (Signed)
You have stayed in sinus rhythm. Follow up with your cardiologist. Return to the ED if you develop chest pain, shortness of breath, or any other concerns.

## 2017-06-29 ENCOUNTER — Ambulatory Visit: Payer: Medicare Other | Admitting: Cardiovascular Disease

## 2017-06-29 ENCOUNTER — Encounter: Payer: Self-pay | Admitting: Cardiovascular Disease

## 2017-06-29 VITALS — BP 163/85 | HR 54 | Ht 67.5 in | Wt 195.2 lb

## 2017-06-29 DIAGNOSIS — I10 Essential (primary) hypertension: Secondary | ICD-10-CM

## 2017-06-29 DIAGNOSIS — R079 Chest pain, unspecified: Secondary | ICD-10-CM

## 2017-06-29 DIAGNOSIS — R002 Palpitations: Secondary | ICD-10-CM

## 2017-06-29 DIAGNOSIS — I38 Endocarditis, valve unspecified: Secondary | ICD-10-CM | POA: Diagnosis not present

## 2017-06-29 DIAGNOSIS — I4891 Unspecified atrial fibrillation: Secondary | ICD-10-CM | POA: Diagnosis not present

## 2017-06-29 MED ORDER — AMLODIPINE BESYLATE 5 MG PO TABS: 5.0000 mg | ORAL_TABLET | Freq: Every day | ORAL | 6 refills | Status: DC

## 2017-06-29 NOTE — Progress Notes (Signed)
SUBJECTIVE: The patient returns for follow-up of chest pain and atrial fibrillation.  Stress test is scheduled for 07/03/17.  Cardiac monitoring demonstrated sinus rhythm, sinus tachycardia, and paroxysmal atrial fibrillation and possibly flutter with occasional PVCs.  I stopped metoprolol and began amiodarone.   She was evaluated for chest pain in the ED on 06/25/17.  I reviewed all relevant duct mentation, labs, and studies.  CBC was unremarkable.  Comprehensive metabolic panel showed creatinine 1.17.  Troponins were normal.  ECG which I personally interpreted demonstrates sinus rhythm with nonspecific T wave abnormalities.  Coronary angiography on 04/12/12 demonstrated very minor nonobstructive coronary artery disease with normal left ventricular function. There was 10-20% mid vessel irregularities in the LAD, 20% proximal left circumflex stenosis, and LVEF 55-65%.  This was performed by Dr. SwazilandJordan.  Echocardiogram 05/20/17: Normal left ventricular systolic and diastolic function with normal regional wall motion, LVEF 60-65%, mild aortic and tricuspid and moderate mitral regurgitation.  She said she feels better overall.  Blood pressure is elevated today, 163/85.  It was 181/90 in the ED on 11/22.  She has retrosternal chest pain which she describes as a burning and pinching sensation and like "cold air in my chest ".  Home blood pressures have run in the 163/97 range.    Review of Systems: As per "subjective", otherwise negative.  Allergies  Allergen Reactions  . Flomax [Tamsulosin Hcl] Nausea And Vomiting    Current Outpatient Medications  Medication Sig Dispense Refill  . acetaminophen (TYLENOL) 650 MG CR tablet Take 650-1,300 mg by mouth See admin instructions. Take 2 tablets every morning and take 1 tablet at night    . amiodarone (PACERONE) 200 MG tablet Take 1 tablet (200 mg total) daily by mouth. (Patient taking differently: Take 200 mg by mouth 2 (two) times daily. )  30 tablet 6  . apixaban (ELIQUIS) 5 MG TABS tablet Take 5 mg by mouth 2 (two) times daily.    . Calcium Carbonate-Vit D-Min 600-400 MG-UNIT TABS Take 1 tablet by mouth daily.     . clonazepam (KLONOPIN) 0.125 MG disintegrating tablet Take 0.125 mg by mouth at bedtime.  5  . fluticasone (FLONASE) 50 MCG/ACT nasal spray Place 2 sprays into both nostrils daily as needed for allergies or rhinitis.    Marland Kitchen. loratadine (CLARITIN) 10 MG tablet Take 10 mg by mouth daily as needed for allergies.    . Multiple Vitamins-Minerals (CENTRUM SILVER) tablet Take 1 tablet by mouth daily.      Marland Kitchen. omeprazole (PRILOSEC) 20 MG capsule Take 20 mg by mouth 2 (two) times daily.     No current facility-administered medications for this visit.     Past Medical History:  Diagnosis Date  . Breast nodule 11/16/2013  . H/O hematuria   . HLD (hyperlipidemia)   . Hypertension   . 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 Palpitations   . Restless leg syndrome   . Vaginal atrophy     Past Surgical History:  Procedure Laterality Date  . APPENDECTOMY    . CARDIAC CATHETERIZATION  2005    cone  . COLONOSCOPY    . HERNIA REPAIR    . LEFT HEART CATHETERIZATION WITH CORONARY ANGIOGRAM N/A 04/12/2012   Procedure: LEFT HEART CATHETERIZATION WITH CORONARY ANGIOGRAM;  Surgeon: Kathleene Hazelhristopher D McAlhany, MD;  [facility];  Service: Cardiovascular;  Laterality: N/A;  . TOTAL ABDOMINAL HYSTERECTOMY      Social History   Socioeconomic  History  . Marital status: Single    Spouse name: Not on file  . Number of children: 0  . Years of education: College  . Highest education level: Not on file  Social Needs  . Financial resource strain: Not on file  . Food insecurity - worry: Not on file  . Food insecurity - inability: Not on file  . Transportation needs - medical: Not on file  . Transportation needs - non-medical: Not on file  Occupational History  . Occupation: retired  Tobacco Use  . Smoking status: Never  Smoker  . Smokeless tobacco: Never Used  Substance and Sexual Activity  . Alcohol use: Yes    Comment: rarely; glass of wine with dinner  . Drug use: No  . Sexual activity: No    Birth control/protection: Surgical    Comment: hyst  Other Topics Concern  . Not on file  Social History Narrative   Single. Retired.    Patient lives at home alone.   Caffeine Use: none     Vitals:   06/29/17 1108  BP: (!) 163/85  Pulse: (!) 54  Weight: 195 lb 3.2 oz (88.5 kg)  Height: 5' 7.5" (1.715 m)    Wt Readings from Last 3 Encounters:  06/29/17 195 lb 3.2 oz (88.5 kg)  06/25/17 188 lb (85.3 kg)  05/07/17 193 lb (87.5 kg)     PHYSICAL EXAM General: NAD HEENT: Normal. Neck: No JVD, no thyromegaly. Lungs: Clear to auscultation bilaterally with normal respiratory effort. CV: Regular rate and rhythm, normal S1/S2, no S3/S4, no murmur. No pretibial or periankle edema.  Abdomen: Soft, nontender, no distention.  Neurologic: Alert and oriented.  Psych: Normal affect. Skin: Normal. Musculoskeletal: No gross deformities.    ECG: Most recent ECG reviewed.   Labs: Lab Results  Component Value Date/Time   K 4.2 06/25/2017 05:09 AM   BUN 13 06/25/2017 05:09 AM   BUN 16 10/21/2013 01:10 PM   CREATININE 1.17 (H) 06/25/2017 05:09 AM   ALT 17 06/25/2017 05:09 AM   TSH 3.010 10/21/2013 01:10 PM   HGB 13.3 06/25/2017 05:09 AM     Lipids: No results found for: LDLCALC, LDLDIRECT, CHOL, TRIG, HDL     ASSESSMENT AND PLAN: 1.  Atrial fibrillation: Symptomatically improved.  Continue amiodarone for maintenance in sinus rhythm and continue Eliquis for anticoagulation.  TSH and LFTs due to be checked in near future.  2.  Chest pain: Scheduled for stress test on 07/03/17.  I will aim to control blood pressure by adding amlodipine 5 mg daily.  3.  Valvular heart disease: Stable.  I will monitor clinically and with surveillance echocardiography.  4.  Essential hypertension: Blood pressure has  been persistently elevated.  I will start amlodipine 5 mg daily.   Disposition: Follow up 2 months  Time spent: 40 minutes, of which greater than 50% was spent reviewing symptoms, relevant blood tests and studies, and discussing management plan with the patient.    Prentice DockerSuresh Amaziah Raisanen, M.D., F.A.C.C.

## 2017-06-29 NOTE — Patient Instructions (Signed)
Medication Instructions:   Begin Amlodipine 5mg  daily.  Continue all other medications.    Labwork: none  Testing/Procedures: none  Follow-Up: 2 months   Any Other Special Instructions Will Be Listed Below (If Applicable).  If you need a refill on your cardiac medications before your next appointment, please call your pharmacy.

## 2017-06-30 ENCOUNTER — Telehealth: Payer: Self-pay | Admitting: Cardiovascular Disease

## 2017-06-30 NOTE — Telephone Encounter (Signed)
Pt says no new symptoms just feeling her heart race while at rest. Says she doesn't notice this feeling while up moving around. Explained that previous stated BP/HR was WNL. Pt started amlodipine yesterday as per OV. Pt is scheduled for stress test 11/30. Denies SOB/dizziness/CP. Pt wanted to know if she should still be taking amio, Eliquis, and amlodipine and explained to pt that she should remain on all medications unless provider says otherwise and that likely will see what stress test shows since no new/worsening symptoms. Will forward to Dr Purvis SheffieldKoneswaran for any further suggestions.

## 2017-06-30 NOTE — Telephone Encounter (Signed)
LM on home VM as requested by pt.

## 2017-06-30 NOTE — Telephone Encounter (Signed)
Agree with your recommendations 

## 2017-06-30 NOTE — Telephone Encounter (Signed)
Patient woke up last night around 2:30 am with her heart rate racing and pounding. Stated her face was Red like she was sun burnt  130/91 pulse 90 A little bit later she took it again and it was 129/83 pulse 88. At 6am this morning it was 119/90 pulse 87. Took again at 8:20 and it was 128/81 pulse 91 She would like to know exactly what medication she should be taking

## 2017-07-02 ENCOUNTER — Telehealth: Payer: Self-pay | Admitting: Cardiovascular Disease

## 2017-07-02 NOTE — Telephone Encounter (Signed)
Pt says since early this morning has been vomiting and dizzy, thinks amio or amlodipine is causing this - denies any other symptoms. Explained that she has been on amio going on 3 weeks and maybe unlikely to cause symptoms. Pt wants to know if ok to take meclizine and nausea medication with amio. Pt also has stress test scheduled for tomorrow. Advised if still dizzy/vomiting that may need to reschedule. Will forward to provider

## 2017-07-02 NOTE — Telephone Encounter (Signed)
Patient called stating that the amiodarone (PACERONE) 200 MG tablet  States that she is very nauseated and having dizzy spells.

## 2017-07-03 ENCOUNTER — Encounter (HOSPITAL_COMMUNITY)
Admission: RE | Admit: 2017-07-03 | Discharge: 2017-07-03 | Disposition: A | Discharge status: Home / Self Care | Payer: Medicare Other | Source: Ambulatory Visit | Attending: Cardiovascular Disease | Admitting: Cardiovascular Disease

## 2017-07-03 ENCOUNTER — Ambulatory Visit (HOSPITAL_COMMUNITY)
Admission: RE | Admit: 2017-07-03 | Discharge: 2017-07-03 | Disposition: A | Discharge status: Home / Self Care | Payer: Medicare Other | Source: Ambulatory Visit | Attending: Cardiovascular Disease | Admitting: Cardiovascular Disease

## 2017-07-03 DIAGNOSIS — R079 Chest pain, unspecified: Secondary | ICD-10-CM | POA: Insufficient documentation

## 2017-07-03 LAB — NM MYOCAR MULTI W/SPECT W/WALL MOTION / EF
CHL CUP RESTING HR STRESS: 57 {beats}/min
CSEPPHR: 151 {beats}/min
LVDIAVOL: 49 mL (ref 46–106)
LVSYSVOL: 10 mL
RATE: 0.26
SDS: 3
SRS: 4
SSS: 7
TID: 1.04

## 2017-07-03 MED ORDER — REGADENOSON 0.4 MG/5ML IV SOLN
INTRAVENOUS | Status: AC
  Administered 2017-07-03: 0.4 mg via INTRAVENOUS
  Filled 2017-07-03: qty 5

## 2017-07-03 MED ORDER — SODIUM CHLORIDE 0.9% FLUSH
INTRAVENOUS | Status: AC
  Filled 2017-07-03: qty 180

## 2017-07-03 MED ORDER — SODIUM CHLORIDE 0.9% FLUSH
INTRAVENOUS | Status: AC
  Administered 2017-07-03: 10 mL via INTRAVENOUS
  Filled 2017-07-03: qty 10

## 2017-07-03 MED ORDER — TECHNETIUM TC 99M TETROFOSMIN IV KIT
10.0000 | PACK | Freq: Once | INTRAVENOUS | Status: AC | PRN
  Administered 2017-07-03: 10 via INTRAVENOUS

## 2017-07-03 MED ORDER — TECHNETIUM TC 99M TETROFOSMIN IV KIT
30.0000 | PACK | Freq: Once | INTRAVENOUS | Status: AC | PRN
  Administered 2017-07-03: 30 via INTRAVENOUS

## 2017-07-03 NOTE — Telephone Encounter (Signed)
Can take meclizine and med for nausea.

## 2017-07-03 NOTE — Telephone Encounter (Signed)
Patient notified and verbalized understanding. 

## 2017-07-06 ENCOUNTER — Encounter: Payer: Self-pay | Admitting: *Deleted

## 2017-07-20 ENCOUNTER — Encounter (HOSPITAL_COMMUNITY): Payer: Self-pay | Admitting: *Deleted

## 2017-07-20 ENCOUNTER — Telehealth: Payer: Self-pay | Admitting: *Deleted

## 2017-07-20 ENCOUNTER — Telehealth: Payer: Self-pay | Admitting: Cardiovascular Disease

## 2017-07-20 ENCOUNTER — Emergency Department (HOSPITAL_COMMUNITY): Payer: Medicare Other

## 2017-07-20 ENCOUNTER — Emergency Department (HOSPITAL_COMMUNITY)
Admission: EM | Admit: 2017-07-20 | Discharge: 2017-07-20 | Disposition: A | Discharge status: Home / Self Care | Payer: Medicare Other | Attending: Emergency Medicine | Admitting: Emergency Medicine

## 2017-07-20 ENCOUNTER — Other Ambulatory Visit: Payer: Self-pay

## 2017-07-20 DIAGNOSIS — Z79899 Other long term (current) drug therapy: Secondary | ICD-10-CM | POA: Insufficient documentation

## 2017-07-20 DIAGNOSIS — R072 Precordial pain: Secondary | ICD-10-CM | POA: Insufficient documentation

## 2017-07-20 DIAGNOSIS — I10 Essential (primary) hypertension: Secondary | ICD-10-CM | POA: Diagnosis not present

## 2017-07-20 DIAGNOSIS — N289 Disorder of kidney and ureter, unspecified: Secondary | ICD-10-CM

## 2017-07-20 DIAGNOSIS — R079 Chest pain, unspecified: Secondary | ICD-10-CM | POA: Diagnosis present

## 2017-07-20 LAB — BASIC METABOLIC PANEL
Anion gap: 7 (ref 5–15)
BUN: 22 mg/dL — AB (ref 6–20)
CO2: 28 mmol/L (ref 22–32)
CREATININE: 1.36 mg/dL — AB (ref 0.44–1.00)
Calcium: 9.6 mg/dL (ref 8.9–10.3)
Chloride: 103 mmol/L (ref 101–111)
GFR, EST AFRICAN AMERICAN: 43 mL/min — AB (ref >60)
GFR, EST NON AFRICAN AMERICAN: 37 mL/min — AB (ref >60)
Glucose, Bld: 109 mg/dL — ABNORMAL HIGH (ref 65–99)
POTASSIUM: 4.7 mmol/L (ref 3.5–5.1)
SODIUM: 138 mmol/L (ref 135–145)

## 2017-07-20 LAB — TROPONIN I: Troponin I: <0.03 ng/mL (ref <0.03)

## 2017-07-20 LAB — CBC
HEMATOCRIT: 43.1 % (ref 36.0–46.0)
Hemoglobin: 13.9 g/dL (ref 12.0–15.0)
MCH: 32.6 pg (ref 26.0–34.0)
MCHC: 32.3 g/dL (ref 30.0–36.0)
MCV: 100.9 fL — ABNORMAL HIGH (ref 78.0–100.0)
PLATELETS: 236 10*3/uL (ref 150–400)
RBC: 4.27 MIL/uL (ref 3.87–5.11)
RDW: 13.5 % (ref 11.5–15.5)
WBC: 6 10*3/uL (ref 4.0–10.5)

## 2017-07-20 NOTE — ED Notes (Signed)
Patient offered wheel chair to car, Patient refused.

## 2017-07-20 NOTE — ED Provider Notes (Signed)
[facility] EMERGENCY DEPARTMENT Provider Note   CSN: [id number] Arrival date & time: 07/20/17  0932     History   Chief Complaint Chief Complaint  Patient presents with  . Chest Pain    HPI Zoey Thurmon FairM Niswander is a 76 y.o. female.  Patient with known history of atrial fibrillation.  Being followed by cardiology for this.  Patient is on a blood thinner.  And and also on amiodarone.  Patient's presentation today though is for chest pain that started on Saturday at about 3 in the afternoon and ended Sunday night.  Today still had some discomfort more described as a burning sensation in the left anterior chest.  Pain also would radiate to the back.  Patient had generalized weakness with no shortness of breath no palpitations.  No nausea no vomiting.  Patient with no known history of coronary artery disease patient did have a cardiac cath several years ago without any significant findings.  More recently had stress test without any significant abnormalities.      Past Medical History:  Diagnosis Date  . Breast nodule 11/16/2013  . H/O hematuria   . HLD (hyperlipidemia)   . Hypertension   . 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 Palpitations   . Restless leg syndrome   . Vaginal atrophy     Patient Active Problem List   Diagnosis Date Noted  . Vaginal dryness 01/28/2017  . Vaginal atrophy 01/28/2017  . Breast nodule 11/16/2013  . Breast mass, right 12/22/2012  . OVERWEIGHT 04/13/2009  . GENERALIZED ANXIETY DISORDER 04/13/2009  . CORONARY ARTERY DISEASE 04/13/2009  . PALPITATIONS 04/03/2009    Past Surgical History:  Procedure Laterality Date  . APPENDECTOMY    . CARDIAC CATHETERIZATION  2005    cone  . COLONOSCOPY    . HERNIA REPAIR    . LEFT HEART CATHETERIZATION WITH CORONARY ANGIOGRAM N/A 04/12/2012   Procedure: LEFT HEART CATHETERIZATION WITH CORONARY ANGIOGRAM;  Surgeon: Kathleene Hazelhristopher D McAlhany, MD;  [facility];  Service: Cardiovascular;  Laterality:  N/A;  . TOTAL ABDOMINAL HYSTERECTOMY      OB History    Gravida Para Term Preterm AB Living   1       1 0   SAB TAB Ectopic Multiple Live Births   1               Home Medications    Prior to Admission medications   Medication Sig Start Date End Date Taking? Authorizing Provider  acetaminophen (TYLENOL) 650 MG CR tablet Take 650-1,300 mg by mouth See admin instructions. Take 2 tablets every morning and take 1 tablet at night   Yes [provider]  amiodarone (PACERONE) 200 MG tablet Take 1 tablet (200 mg total) daily by mouth. 06/22/17 07/22/17 Yes Laqueta LindenKoneswaran, Suresh A, MD  amLODipine (NORVASC) 5 MG tablet Take 1 tablet (5 mg total) by mouth daily. 06/29/17  Yes Laqueta LindenKoneswaran, Suresh A, MD  apixaban (ELIQUIS) 5 MG TABS tablet Take 5 mg by mouth 2 (two) times daily.   Yes [provider]  Calcium Carbonate-Vit D-Min 600-400 MG-UNIT TABS Take 1 tablet by mouth daily.    Yes [provider]  clonazepam (KLONOPIN) 0.125 MG disintegrating tablet Take 0.125 mg by mouth at bedtime. 01/12/15  Yes [provider]  fluticasone (FLONASE) 50 MCG/ACT nasal spray Place 2 sprays into both nostrils daily as needed for allergies or rhinitis.   Yes [provider]  loratadine (CLARITIN)  10 MG tablet Take 10 mg by mouth daily as needed for allergies.   Yes [provider]  Multiple Vitamins-Minerals (CENTRUM SILVER) tablet Take 1 tablet by mouth daily.     Yes [provider]  omeprazole (PRILOSEC) 20 MG capsule Take 20 mg by mouth 2 (two) times daily.   Yes [provider]    Family History Family History  Problem Relation Age of Onset  . Coronary artery disease Father        mid 5s  . Hypertension Mother   . Stroke Brother   . Transient ischemic attack Brother   . Other Brother        on oxygen  . Hypertension Brother   . Stroke Brother   . Heart disease Brother   . Heart attack Brother   . Other Sister        a fib,  restless leg  . Other Sister        pressure in eyes    Social History Social History   Tobacco Use  . Smoking status: Never Smoker  . Smokeless tobacco: Never Used  Substance Use Topics  . Alcohol use: Yes    Comment: rarely; glass of wine with dinner  . Drug use: No     Allergies   Flomax [tamsulosin hcl]   Review of Systems Review of Systems  Constitutional: Negative for fever.  HENT: Negative for congestion.   Eyes: Negative for redness.  Respiratory: Negative for shortness of breath.   Cardiovascular: Positive for chest pain. Negative for palpitations and leg swelling.  Gastrointestinal: Negative for abdominal pain.  Genitourinary: Negative for dysuria.  Musculoskeletal: Positive for back pain.  Skin: Negative for rash.  Neurological: Negative for syncope and headaches.  Hematological: Bruises/bleeds easily.  Psychiatric/Behavioral: Negative for confusion.     Physical Exam Updated Vital Signs BP 129/81   Pulse 69   Temp 98.9 F (37.2 C) (Oral)   Resp 18   Ht 1.715 m (5' 7.5")   Wt 86.2 kg (190 lb)   SpO2 97%   BMI 29.32 kg/m   Physical Exam  Constitutional: She is oriented to person, place, and time. She appears well-developed and well-nourished. No distress.  HENT:  Head: Normocephalic and atraumatic.  Mouth/Throat: Oropharynx is clear and moist.  Eyes: Conjunctivae and EOM are normal. Pupils are equal, round, and reactive to light.  Neck: Normal range of motion. Neck supple.  Cardiovascular: Normal rate, regular rhythm and normal heart sounds.  Pulmonary/Chest: Effort normal and breath sounds normal. No respiratory distress. She exhibits no tenderness.  Abdominal: Soft. Bowel sounds are normal. There is no tenderness.  Musculoskeletal: Normal range of motion. She exhibits no edema.  Neurological: She is alert and oriented to person, place, and time. No cranial nerve deficit or sensory deficit. She exhibits normal muscle tone. Coordination normal.   Skin: Skin is warm.  Nursing note and vitals reviewed.    ED Treatments / Results  Labs (all labs ordered are listed, but only abnormal results are displayed) Labs Reviewed  BASIC METABOLIC PANEL - Abnormal; Notable for the following components:      Result Value   Glucose, Bld 109 (*)    BUN 22 (*)    Creatinine, Ser 1.36 (*)    GFR calc non Af Amer 37 (*)    GFR calc Af Amer 43 (*)    All other components within normal limits  CBC - Abnormal; Notable for the following components:   MCV  100.9 (*)    All other components within normal limits  TROPONIN I    EKG  EKG Interpretation  Date/Time:  Monday July 20 2017 09:43:05 EST Ventricular Rate:  68 PR Interval:    QRS Duration: 100 QT Interval:  428 QTC Calculation: 456 R Axis:   79 Text Interpretation:  Sinus rhythm Low voltage, precordial leads Borderline T abnormalities, diffuse leads Confirmed by Vanetta MuldersZackowski, Gennie Eisinger [phone]([id number]) on 07/20/2017 10:08:21 AM       Radiology Dg Chest 2 View  Result Date: 07/20/2017 CLINICAL DATA:  Chest pain EXAM: CHEST  2 VIEW COMPARISON:  04/27/2017 chest radiograph. FINDINGS: Stable cardiomediastinal silhouette with normal heart size. No pneumothorax. No pleural effusion. Lungs appear clear, with no acute consolidative airspace disease and no pulmonary edema. IMPRESSION: No active cardiopulmonary disease. Electronically Signed   By: Delbert PhenixJason A Poff M.D.   On: 07/20/2017 11:26    Procedures Procedures (including critical care time)  Medications Ordered in ED Medications - No data to display   Initial Impression / Assessment and Plan / ED Course  I have reviewed the triage vital signs and the nursing notes.  Pertinent labs & imaging results that were available during my care of the patient were reviewed by me and considered in my medical decision making (see chart for details).    I workup for the chest pain and discomfort there is been present since Saturday afternoon troponin  negative chest x-ray negative basic labs without any significant abnormalities of the and patient's renal function.  Patient stable for discharge home and close follow-up with cardiology.  Will need to have her renal function rechecked.  Patient will return for any new or worse symptoms.  Regarding her atrial fibrillation rate controlled here today mostly in sinus rhythm.   Final Clinical Impressions(s) / ED Diagnoses   Final diagnoses:  Precordial pain  Renal insufficiency    ED Discharge Orders    None       Vanetta MuldersZackowski, Wrenn Willcox, MD 07/20/17 1526

## 2017-07-20 NOTE — ED Triage Notes (Signed)
Pt c/o intermittent mid chest pain that radiates to the back along with weakness. Pt describes the pain as pinching/stinging. Pt on Eliquis x 1 month for irregular heart beat. Denies SOB, n/v.

## 2017-07-20 NOTE — ED Notes (Signed)
Patient requesting crackers and drink. Informed patient NPO until results come back. Patient verbalizes understanding.

## 2017-07-20 NOTE — Telephone Encounter (Signed)
Pt c/o chest pain off an on all weekend - this morning c/o burning/stinging in chest without the pain. Also says she has been urinating every 2 hours without increasing fluid intake or taking any diuretics - advised pt to be evaluated at [facility] for the chest discomfort since this has been going on for the last few days and still having discomfort - doesn't know what HR/BP has been running - denies SOB/dizziness. Voiced understanding, and says she was able to drive herself. Will forward to provider as Lorain ChildesFYI

## 2017-07-20 NOTE — Telephone Encounter (Signed)
Patient walked into the office after leaving the ER at [facility] today stating that she was told to check with Dr. Purvis SheffieldKoneswaran About having blood work and a possible Renal study.  Patient has upcoming appointment at the end of January.

## 2017-07-20 NOTE — Discharge Instructions (Signed)
Follow-up with cardiology.  Today's workup negative for any acute cardiac event.  Chest x-ray negative as well.  We did have some signs of worsening renal function.  And rechecking that will be important.  Would call later today or tomorrow for follow-up with cardiology.  Return for any new or worse symptoms.

## 2017-07-21 ENCOUNTER — Ambulatory Visit: Payer: Medicare Other | Admitting: Cardiovascular Disease

## 2017-07-21 NOTE — Telephone Encounter (Signed)
Her renal function was slightly abnormal, and was probably related to some degree of dehydration. I recommend aggressive hydration with water (avoid caffeine). Stress test was low risk with no signs of significant blockages.

## 2017-07-21 NOTE — Telephone Encounter (Signed)
LMTCB

## 2017-07-21 NOTE — Telephone Encounter (Signed)
Patient informed. 

## 2017-08-31 ENCOUNTER — Other Ambulatory Visit (HOSPITAL_COMMUNITY)
Admission: RE | Admit: 2017-08-31 | Discharge: 2017-08-31 | Disposition: A | Discharge status: Home / Self Care | Payer: Medicare Other | Source: Ambulatory Visit | Attending: Cardiovascular Disease | Admitting: Cardiovascular Disease

## 2017-08-31 ENCOUNTER — Ambulatory Visit: Payer: Medicare Other | Admitting: Cardiovascular Disease

## 2017-08-31 ENCOUNTER — Other Ambulatory Visit: Payer: Self-pay

## 2017-08-31 ENCOUNTER — Encounter: Payer: Self-pay | Admitting: Cardiovascular Disease

## 2017-08-31 VITALS — BP 116/73 | HR 58 | Ht 67.0 in | Wt 194.0 lb

## 2017-08-31 DIAGNOSIS — R1013 Epigastric pain: Secondary | ICD-10-CM | POA: Diagnosis not present

## 2017-08-31 DIAGNOSIS — I4891 Unspecified atrial fibrillation: Secondary | ICD-10-CM

## 2017-08-31 DIAGNOSIS — I10 Essential (primary) hypertension: Secondary | ICD-10-CM | POA: Diagnosis not present

## 2017-08-31 DIAGNOSIS — Z79899 Other long term (current) drug therapy: Secondary | ICD-10-CM | POA: Insufficient documentation

## 2017-08-31 DIAGNOSIS — R079 Chest pain, unspecified: Secondary | ICD-10-CM

## 2017-08-31 DIAGNOSIS — I38 Endocarditis, valve unspecified: Secondary | ICD-10-CM

## 2017-08-31 LAB — TSH: TSH: 3.031 u[IU]/mL (ref 0.350–4.500)

## 2017-08-31 LAB — HEPATIC FUNCTION PANEL
ALBUMIN: 4 g/dL (ref 3.5–5.0)
ALT: 16 U/L (ref 14–54)
AST: 26 U/L (ref 15–41)
Alkaline Phosphatase: 70 U/L (ref 38–126)
Bilirubin, Direct: <0.1 mg/dL — ABNORMAL LOW (ref 0.1–0.5)
Total Bilirubin: 0.5 mg/dL (ref 0.3–1.2)
Total Protein: 6.8 g/dL (ref 6.5–8.1)

## 2017-08-31 NOTE — Progress Notes (Signed)
SUBJECTIVE: The patient presents for follow-up of chest pain and atrial fibrillation.  Nuclear stress test was low risk on 07/03/17, LVEF 80%.  She was evaluated for chest pain in the ED on 07/20/17 and workup was unremarkable.  Coronary angiography on 04/12/12 demonstrated very minor nonobstructive coronary artery disease with normal left ventricular function. There was 10-20% mid vessel irregularities in the LAD, 20% proximal left circumflex stenosis, and LVEF 55-65%.  This was performed by Dr. SwazilandJordan.  Echocardiogram 05/20/17: Normal left ventricular systolic and diastolic function with normal regional wall motion, LVEF 60-65%, mild aortic and tricuspid and moderate mitral regurgitation.  She has had no further issues with chest pain.  She now only seldom has palpitations.  She has been experiencing some epigastric discomfort.  She takes Tums with relief.  She is on omeprazole.  She is scheduled to undergo an abdominal ultrasound.  She does talk about dysphagia for solids.     Review of Systems: As per "subjective", otherwise negative.  Allergies  Allergen Reactions  . Flomax [Tamsulosin Hcl] Nausea And Vomiting    Current Outpatient Medications  Medication Sig Dispense Refill  . acetaminophen (TYLENOL) 650 MG CR tablet Take 650-1,300 mg by mouth See admin instructions. Take 2 tablets every morning and take 1 tablet at night    . amiodarone (PACERONE) 200 MG tablet Take 200 mg by mouth daily.    Marland Kitchen. amLODipine (NORVASC) 5 MG tablet Take 1 tablet (5 mg total) by mouth daily. 30 tablet 6  . apixaban (ELIQUIS) 5 MG TABS tablet Take 5 mg by mouth 2 (two) times daily.    . Calcium Carbonate-Vit D-Min 600-400 MG-UNIT TABS Take 1 tablet by mouth daily.     . clonazepam (KLONOPIN) 0.125 MG disintegrating tablet Take 0.125 mg by mouth at bedtime.  5  . fluticasone (FLONASE) 50 MCG/ACT nasal spray Place 2 sprays into both nostrils daily as needed for allergies or rhinitis.    Marland Kitchen.  loratadine (CLARITIN) 10 MG tablet Take 10 mg by mouth daily as needed for allergies.    . Multiple Vitamins-Minerals (CENTRUM SILVER) tablet Take 1 tablet by mouth daily.      Marland Kitchen. omeprazole (PRILOSEC) 20 MG capsule Take 20 mg by mouth 2 (two) times daily.     No current facility-administered medications for this visit.     Past Medical History:  Diagnosis Date  . Breast nodule 11/16/2013  . H/O hematuria   . HLD (hyperlipidemia)   . Hypertension   . 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 Palpitations   . Restless leg syndrome   . Vaginal atrophy     Past Surgical History:  Procedure Laterality Date  . APPENDECTOMY    . CARDIAC CATHETERIZATION  2005    cone  . COLONOSCOPY    . HERNIA REPAIR    . LEFT HEART CATHETERIZATION WITH CORONARY ANGIOGRAM N/A 04/12/2012   Procedure: LEFT HEART CATHETERIZATION WITH CORONARY ANGIOGRAM;  Surgeon: Kathleene Hazelhristopher D McAlhany, MD;  [facility];  Service: Cardiovascular;  Laterality: N/A;  . TOTAL ABDOMINAL HYSTERECTOMY      Social History   Socioeconomic History  . Marital status: Single    Spouse name: Not on file  . Number of children: 0  . Years of education: College  . Highest education level: Not on file  Social Needs  . Financial resource strain: Not on file  . Food insecurity - worry: Not on file  . Food  insecurity - inability: Not on file  . Transportation needs - medical: Not on file  . Transportation needs - non-medical: Not on file  Occupational History  . Occupation: retired  Tobacco Use  . Smoking status: Never Smoker  . Smokeless tobacco: Never Used  Substance and Sexual Activity  . Alcohol use: Yes    Comment: rarely; glass of wine with dinner  . Drug use: No  . Sexual activity: No    Birth control/protection: Surgical    Comment: hyst  Other Topics Concern  . Not on file  Social History Narrative   Single. Retired.    Patient lives at home alone.   Caffeine Use: none     Vitals:   08/31/17  0952  BP: 116/73  Pulse: (!) 58  SpO2: 99%  Weight: 194 lb (88 kg)  Height: 5\' 7"  (1.702 m)    Wt Readings from Last 3 Encounters:  08/31/17 194 lb (88 kg)  07/20/17 190 lb (86.2 kg)  06/29/17 195 lb 3.2 oz (88.5 kg)     PHYSICAL EXAM General: NAD HEENT: Normal. Neck: No JVD, no thyromegaly. Lungs: Clear to auscultation bilaterally with normal respiratory effort. CV: Regular rate and rhythm, normal S1/S2, no S3/S4, no murmur. No pretibial or periankle edema.  No carotid bruit.   Abdomen: Soft, mild epigastric tenderness, no distention.  Neurologic: Alert and oriented.  Psych: Normal affect. Skin: Normal. Musculoskeletal: No gross deformities.    ECG: Most recent ECG reviewed.   Labs: Lab Results  Component Value Date/Time   K 4.7 07/20/2017 10:31 AM   BUN 22 (H) 07/20/2017 10:31 AM   BUN 16 10/21/2013 01:10 PM   CREATININE 1.36 (H) 07/20/2017 10:31 AM   ALT 17 06/25/2017 05:09 AM   TSH 3.010 10/21/2013 01:10 PM   HGB 13.9 07/20/2017 10:31 AM     Lipids: No results found for: LDLCALC, LDLDIRECT, CHOL, TRIG, HDL     ASSESSMENT AND PLAN: 1.  Atrial fibrillation: Symptomatically stable.  Continue amiodarone for maintenance of sinus rhythm and continue Eliquis for anticoagulation.    I will check TSH and LFTs.  2.  Chest pain: Nuclear stress test was low risk as detailed above.  Symptoms have improved with blood pressure control.  3.  Valvular heart disease: Stable.  I will monitor clinically and with surveillance echocardiography.  4.  Essential hypertension: Blood pressure is controlled with the addition of amlodipine 5 mg daily.  No changes.  5.  Epigastric discomfort: She does have dysphagia for solids and I wonder if she has some gastritis and possible esophagitis.  She is scheduled to undergo an abdominal ultrasound in the near future.  I am obtaining LFTs as she is on amiodarone.    Disposition: Follow up 6 months   Prentice Docker, M.D.,  F.A.C.C.

## 2017-08-31 NOTE — Patient Instructions (Signed)
Medication Instructions:  Your physician recommends that you continue on your current medications as directed. Please refer to the Current Medication list given to you today.  Labwork: TSH, LFT Orders given today  Testing/Procedures: NOEN  Follow-Up: Your physician wants you to follow-up in: 6 MONTHS WITH DR. Purvis SheffieldKONESWARAN You will receive a reminder letter in the mail two months in advance. If you don't receive a letter, please call our office to schedule the follow-up appointment.  Any Other Special Instructions Will Be Listed Below (If Applicable).  If you need a refill on your cardiac medications before your next appointment, please call your pharmacy.

## 2017-09-01 ENCOUNTER — Telehealth: Payer: Self-pay | Admitting: *Deleted

## 2017-09-01 NOTE — Telephone Encounter (Signed)
Notes recorded by Lesle ChrisHill, Kalkidan Caudell G, LPN on 1/30/86571/29/2019 at 8:41 AM EST Patient notified. Copy to pmd.

## 2017-09-01 NOTE — Telephone Encounter (Signed)
TSH -  Notes recorded by Laqueta LindenKoneswaran, Suresh A, MD on 08/31/2017 at 2:11 PM EST Normal.  LIVER FUNCTION -  Notes recorded by Laqueta LindenKoneswaran, Suresh A, MD on 08/31/2017 at 1:29 PM EST Normal liver enzymes.

## 2017-10-07 ENCOUNTER — Telehealth: Payer: Self-pay | Admitting: Cardiovascular Disease

## 2017-10-07 NOTE — Telephone Encounter (Signed)
Received referral from Dr. Rosann Auerbachaniel's office on patient for chest pain and problems with amiodarone.  Call placed to patient as she is already an established patient - last seen by Dr. Purvis SheffieldKoneswaran on 08/31/2017.  She went to see her pmd due to still having this chest pain that was thought to be GI related.  She describes the pain as being in the area where her lower rib cage rolls around near her sternum.  Stated that Dr. Reuel Boomaniel did do abdominal ultrasound that turned out to to be normal.  He also changed her reflux medication from Omeprazole to Pantoprazole 40mg  twice a day - has only been doing this a few days now.  Currently she states that she is not having any chest pains that are bothering her.  PMD wanted to make sure that the Amiodarone was not causing these GI problems & wanted patient to touch base with us.

## 2017-10-07 NOTE — Telephone Encounter (Signed)
[facility] called stating that [facility]

## 2017-10-07 NOTE — Telephone Encounter (Signed)
Liver transaminases were normal. She may need an EGD if symptoms don't resolve with Protonix 40 mg bid.

## 2017-10-08 NOTE — Telephone Encounter (Signed)
Patient notified.  States she feels good this morning.  Does have follow up with her pmd next week for her yearly physical & will re-evaluate then as well.

## 2017-10-23 ENCOUNTER — Other Ambulatory Visit: Payer: Self-pay | Admitting: Adult Health

## 2017-10-23 DIAGNOSIS — Z1231 Encounter for screening mammogram for malignant neoplasm of breast: Secondary | ICD-10-CM

## 2017-11-25 ENCOUNTER — Ambulatory Visit (HOSPITAL_COMMUNITY): Payer: Medicare Other

## 2017-12-03 ENCOUNTER — Ambulatory Visit (HOSPITAL_COMMUNITY)
Admission: RE | Admit: 2017-12-03 | Discharge: 2017-12-03 | Disposition: A | Discharge status: Home / Self Care | Payer: Medicare Other | Source: Ambulatory Visit | Attending: Adult Health | Admitting: Adult Health

## 2017-12-03 ENCOUNTER — Encounter (HOSPITAL_COMMUNITY): Payer: Self-pay

## 2017-12-03 DIAGNOSIS — Z1231 Encounter for screening mammogram for malignant neoplasm of breast: Secondary | ICD-10-CM | POA: Insufficient documentation

## 2017-12-06 ENCOUNTER — Emergency Department (HOSPITAL_COMMUNITY)
Admission: EM | Admit: 2017-12-06 | Discharge: 2017-12-06 | Disposition: A | Discharge status: Home / Self Care | Payer: Medicare Other | Attending: Emergency Medicine | Admitting: Emergency Medicine

## 2017-12-06 ENCOUNTER — Emergency Department (HOSPITAL_COMMUNITY): Payer: Medicare Other

## 2017-12-06 ENCOUNTER — Encounter (HOSPITAL_COMMUNITY): Payer: Self-pay | Admitting: Emergency Medicine

## 2017-12-06 ENCOUNTER — Other Ambulatory Visit: Payer: Self-pay

## 2017-12-06 DIAGNOSIS — Y939 Activity, unspecified: Secondary | ICD-10-CM | POA: Diagnosis not present

## 2017-12-06 DIAGNOSIS — Z79899 Other long term (current) drug therapy: Secondary | ICD-10-CM | POA: Insufficient documentation

## 2017-12-06 DIAGNOSIS — W1830XA Fall on same level, unspecified, initial encounter: Secondary | ICD-10-CM | POA: Diagnosis not present

## 2017-12-06 DIAGNOSIS — S060X0A Concussion without loss of consciousness, initial encounter: Secondary | ICD-10-CM | POA: Diagnosis not present

## 2017-12-06 DIAGNOSIS — W19XXXA Unspecified fall, initial encounter: Secondary | ICD-10-CM

## 2017-12-06 DIAGNOSIS — Y999 Unspecified external cause status: Secondary | ICD-10-CM | POA: Insufficient documentation

## 2017-12-06 DIAGNOSIS — Y929 Unspecified place or not applicable: Secondary | ICD-10-CM | POA: Diagnosis not present

## 2017-12-06 DIAGNOSIS — S0990XA Unspecified injury of head, initial encounter: Secondary | ICD-10-CM | POA: Diagnosis not present

## 2017-12-06 DIAGNOSIS — Z7901 Long term (current) use of anticoagulants: Secondary | ICD-10-CM | POA: Insufficient documentation

## 2017-12-06 DIAGNOSIS — I10 Essential (primary) hypertension: Secondary | ICD-10-CM | POA: Insufficient documentation

## 2017-12-06 NOTE — ED Notes (Signed)
Fell hit her head  No LOC   On blood thinners

## 2017-12-06 NOTE — Discharge Instructions (Signed)
Make an appointment to follow-up with your doctor.  For the concussion.  Head CT CT of neck without any acute findings.  Return for any new or worse symptoms as we discussed.

## 2017-12-06 NOTE — ED Notes (Signed)
Dr Herma Carson apprised of rad results in  Pt desires to leave

## 2017-12-06 NOTE — ED Triage Notes (Signed)
Patient reports falling backwards after bending over to pick up some sticks. Patient states she hit the back of her head, on Eliquis. C/O headache. No dizziness, no LOC. Patient has edema and some bruising to her L medial ankle .

## 2017-12-06 NOTE — ED Notes (Signed)
Pt reports that she bent over outside today and straightened up stepped backwards and fell She reports she fell on her buttocks first, then hit her head on the ground  Denies LOC    reports "neck muscle" pain, denies incontinence, low back pain any other complaint save the fall  Rattled her eyes

## 2017-12-06 NOTE — ED Provider Notes (Signed)
[facility] EMERGENCY DEPARTMENT Provider Note   CSN: [id number] Arrival date & time: 12/06/17  1903     History   Chief Complaint Chief Complaint  Patient presents with  . Fall    HPI Hailey [name] is a 77 y.o. female.  Patient with a fall outside shortly prior to arrival.  Patient bent over then when she stood up she fell backwards.  She hit the back of her head patient is on the blood thinner Eliquis.  Complaining of headache no dizziness but will bit of visual changes no loss of consciousness.  Patient with some bruising to the medial aspect of her left ankle but no pain there.  No back pain may be a little bit of neck stiffness.  No hip pain.  Patient is on the blood thinner due to atrial fibrillation.     Past Medical History:  Diagnosis Date  . Breast nodule 11/16/2013  . H/O hematuria   . HLD (hyperlipidemia)   . Hypertension   . 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 Palpitations   . Restless leg syndrome   . Vaginal atrophy     Patient Active Problem List   Diagnosis Date Noted  . Vaginal dryness 01/28/2017  . Vaginal atrophy 01/28/2017  . Breast nodule 11/16/2013  . Breast mass, right 12/22/2012  . OVERWEIGHT 04/13/2009  . GENERALIZED ANXIETY DISORDER 04/13/2009  . CORONARY ARTERY DISEASE 04/13/2009  . PALPITATIONS 04/03/2009    Past Surgical History:  Procedure Laterality Date  . APPENDECTOMY    . CARDIAC CATHETERIZATION  2005    cone  . COLONOSCOPY    . HERNIA REPAIR    . LEFT HEART CATHETERIZATION WITH CORONARY ANGIOGRAM N/A 04/12/2012   Procedure: LEFT HEART CATHETERIZATION WITH CORONARY ANGIOGRAM;  Surgeon: Kathleene Hazel, MD;  Location: [facility] CATH LAB;  Service: Cardiovascular;  Laterality: N/A;  . TOTAL ABDOMINAL HYSTERECTOMY       OB History    Gravida  1   Para      Term      Preterm      AB  1   Living  0     SAB  1   TAB      Ectopic      Multiple      Live Births               Home Medications      Prior to Admission medications   Medication Sig Start Date End Date Taking? Authorizing Provider  acetaminophen (TYLENOL) 650 MG CR tablet Take 650-1,300 mg by mouth See admin instructions. Take 2 tablets every morning and take 1 tablet at night   Yes [provider]  amiodarone (PACERONE) 200 MG tablet Take 200 mg by mouth daily.   Yes [provider]  amLODipine (NORVASC) 5 MG tablet Take 1 tablet (5 mg total) by mouth daily. 06/29/17  Yes Laqueta Linden, MD  apixaban (ELIQUIS) 5 MG TABS tablet Take 5 mg by mouth 2 (two) times daily.   Yes [provider]  Calcium Carbonate-Vit D-Min 600-400 MG-UNIT TABS Take 1 tablet by mouth daily.    Yes [provider]  clonazepam (KLONOPIN) 0.125 MG disintegrating tablet Take 0.125 mg by mouth at bedtime. 01/12/15  Yes [provider]  meclizine (ANTIVERT) 25 MG tablet Take 25 mg by mouth as needed for dizziness.   Yes [provider]  Multiple Vitamins-Minerals (CENTRUM SILVER) tablet Take 1 tablet  by mouth daily.     Yes [provider]  pantoprazole (PROTONIX) 40 MG tablet Take 40 mg by mouth 2 (two) times daily. 10/05/17  Yes [provider]  doxycycline (VIBRAMYCIN) 100 MG capsule Take 100 mg by mouth 2 (two) times daily. 10/28/17   [provider]  fluticasone (FLONASE) 50 MCG/ACT nasal spray Place 2 sprays into both nostrils daily as needed for allergies or rhinitis.    [provider]  loratadine (CLARITIN) 10 MG tablet Take 10 mg by mouth daily as needed for allergies.    [provider]    Family History Family History  Problem Relation Age of Onset  . Coronary artery disease Father        mid 24s  . Hypertension Mother   . Stroke Brother   . Transient ischemic attack Brother   . Other Brother        on oxygen  . Hypertension Brother   . Stroke Brother   . Heart disease Brother   . Heart attack Brother   . Other Sister        a fib,  restless leg  . Other Sister        pressure in eyes    Social History Social History   Tobacco Use  . Smoking status: Never Smoker  . Smokeless tobacco: Never Used  Substance Use Topics  . Alcohol use: Yes    Comment: rarely; glass of wine with dinner  . Drug use: No     Allergies   Flomax [tamsulosin hcl]   Review of Systems Review of Systems  Constitutional: Negative for fever.  HENT: Negative for congestion.   Eyes: Positive for visual disturbance.  Respiratory: Negative for shortness of breath.   Cardiovascular: Negative for chest pain.  Gastrointestinal: Negative for abdominal pain.  Genitourinary: Negative for dysuria and hematuria.  Musculoskeletal: Positive for neck pain. Negative for back pain.  Neurological: Positive for headaches. Negative for syncope.  Hematological: Bruises/bleeds easily.  Psychiatric/Behavioral: Negative for confusion.     Physical Exam Updated Vital Signs BP 140/66   Pulse (!) 58   Temp 97.9 F (36.6 C) (Oral)   Resp 18   Ht 1.702 m ( )   Wt 86.2 kg (190 lb)   SpO2 98%   BMI 29.76 kg/m   Physical Exam  Constitutional: She is oriented to person, place, and time. She appears well-developed and well-nourished. No distress.  HENT:  Head: Normocephalic and atraumatic.  Mouth/Throat: Oropharynx is clear and moist.  Eyes: Pupils are equal, round, and reactive to light. Conjunctivae and EOM are normal.  Neck: Normal range of motion. Neck supple.  Cardiovascular: Normal rate, regular rhythm and normal heart sounds.  Pulmonary/Chest: Effort normal and breath sounds normal. No respiratory distress.  Abdominal: Soft. Bowel sounds are normal. There is no tenderness.  Musculoskeletal: Normal range of motion.  Left medial ankle with a bruise measuring about 4 cm.  Above the malleolus.  Good range of motion of the ankle no deformity.  No proximal leg tenderness no knee tenderness.  Neurological: She is alert and oriented to person,  place, and time. No cranial nerve deficit or sensory deficit. She exhibits normal muscle tone. Coordination normal.  Skin: Skin is warm.  Nursing note and vitals reviewed.    ED Treatments / Results  Labs (all labs ordered are listed, but only abnormal results are displayed) Labs Reviewed - No data to display  EKG None  Radiology Ct Head Wo  Contrast  Result Date: 12/06/2017 CLINICAL DATA:  Status post falling backwards with trauma to the back of the head. Patient is on Eliquis. EXAM: CT HEAD WITHOUT CONTRAST CT CERVICAL SPINE WITHOUT CONTRAST TECHNIQUE: Multidetector CT imaging of the head and cervical spine was performed following the standard protocol without intravenous contrast. Multiplanar CT image reconstructions of the cervical spine were also generated. COMPARISON:  Brain MRI 09/22/2016 FINDINGS: CT HEAD FINDINGS Brain: No evidence of acute infarction, hemorrhage, hydrocephalus, extra-axial collection or mass lesion/mass effect. Vascular: Calcific atherosclerotic disease at the skull base. Skull: Normal. Negative for fracture or focal lesion. Sinuses/Orbits: No acute finding. Other: None. CT CERVICAL SPINE FINDINGS Alignment: Normal. Skull base and vertebrae: No acute fracture. No primary bone lesion or focal pathologic process. Soft tissues and spinal canal: No prevertebral fluid or swelling. No visible canal hematoma. Disc levels:  Multilevel osteoarthritic changes, mild-to-moderate. Upper chest: Negative. Other: None. IMPRESSION: No acute intracranial abnormality. No evidence of acute traumatic injury to cervical spine. Electronically Signed   By: Ted Mcalpine M.D.   On: 12/06/2017 21:04   Ct Cervical Spine Wo Contrast  Result Date: 12/06/2017 CLINICAL DATA:  Status post falling backwards with trauma to the back of the head. Patient is on Eliquis. EXAM: CT HEAD WITHOUT CONTRAST CT CERVICAL SPINE WITHOUT CONTRAST TECHNIQUE: Multidetector CT imaging of the head and cervical spine  was performed following the standard protocol without intravenous contrast. Multiplanar CT image reconstructions of the cervical spine were also generated. COMPARISON:  Brain MRI 09/22/2016 FINDINGS: CT HEAD FINDINGS Brain: No evidence of acute infarction, hemorrhage, hydrocephalus, extra-axial collection or mass lesion/mass effect. Vascular: Calcific atherosclerotic disease at the skull base. Skull: Normal. Negative for fracture or focal lesion. Sinuses/Orbits: No acute finding. Other: None. CT CERVICAL SPINE FINDINGS Alignment: Normal. Skull base and vertebrae: No acute fracture. No primary bone lesion or focal pathologic process. Soft tissues and spinal canal: No prevertebral fluid or swelling. No visible canal hematoma. Disc levels:  Multilevel osteoarthritic changes, mild-to-moderate. Upper chest: Negative. Other: None. IMPRESSION: No acute intracranial abnormality. No evidence of acute traumatic injury to cervical spine. Electronically Signed   By: Ted Mcalpine M.D.   On: 12/06/2017 21:04    Procedures Procedures (including critical care time)  Medications Ordered in ED Medications - No data to display   Initial Impression / Assessment and Plan / ED Course  I have reviewed the triage vital signs and the nursing notes.  Pertinent labs & imaging results that were available during my care of the patient were reviewed by me and considered in my medical decision making (see chart for details).    CT head and neck without any acute findings.  Patient with head injury on blood thinner.  CT neck as stated without any acute findings no real posterior point tenderness but still done since she had some neck stiffness.  Patient due to the visual changes and will be a headache may have a postconcussive syndrome.  She will follow-up with her doctor.  Precautions provided.  Patient stable for discharge home.   Final Clinical Impressions(s) / ED Diagnoses   Final diagnoses:  Fall, initial  encounter  Injury of head, initial encounter  Concussion without loss of consciousness, initial encounter    ED Discharge Orders    None       Vanetta Mulders, MD 12/06/17 2206

## 2017-12-07 ENCOUNTER — Other Ambulatory Visit: Payer: Self-pay | Admitting: Adult Health

## 2017-12-07 DIAGNOSIS — R928 Other abnormal and inconclusive findings on diagnostic imaging of breast: Secondary | ICD-10-CM

## 2017-12-15 ENCOUNTER — Ambulatory Visit (HOSPITAL_COMMUNITY)
Admission: RE | Admit: 2017-12-15 | Discharge: 2017-12-15 | Disposition: A | Discharge status: Home / Self Care | Payer: Medicare Other | Source: Ambulatory Visit | Attending: Adult Health | Admitting: Adult Health

## 2017-12-15 DIAGNOSIS — R928 Other abnormal and inconclusive findings on diagnostic imaging of breast: Secondary | ICD-10-CM | POA: Insufficient documentation

## 2018-01-09 ENCOUNTER — Other Ambulatory Visit: Payer: Self-pay | Admitting: Cardiovascular Disease

## 2018-01-19 ENCOUNTER — Other Ambulatory Visit: Payer: Self-pay

## 2018-01-19 ENCOUNTER — Encounter (HOSPITAL_COMMUNITY): Payer: Self-pay | Admitting: Emergency Medicine

## 2018-01-19 ENCOUNTER — Emergency Department (HOSPITAL_COMMUNITY): Payer: Medicare Other

## 2018-01-19 ENCOUNTER — Emergency Department (HOSPITAL_COMMUNITY)
Admission: EM | Admit: 2018-01-19 | Discharge: 2018-01-19 | Disposition: A | Discharge status: Home / Self Care | Payer: Medicare Other | Attending: Emergency Medicine | Admitting: Emergency Medicine

## 2018-01-19 DIAGNOSIS — Z79899 Other long term (current) drug therapy: Secondary | ICD-10-CM | POA: Insufficient documentation

## 2018-01-19 DIAGNOSIS — R079 Chest pain, unspecified: Secondary | ICD-10-CM | POA: Diagnosis present

## 2018-01-19 DIAGNOSIS — R0789 Other chest pain: Secondary | ICD-10-CM

## 2018-01-19 DIAGNOSIS — I10 Essential (primary) hypertension: Secondary | ICD-10-CM | POA: Insufficient documentation

## 2018-01-19 DIAGNOSIS — K219 Gastro-esophageal reflux disease without esophagitis: Secondary | ICD-10-CM | POA: Diagnosis not present

## 2018-01-19 LAB — CBC WITH DIFFERENTIAL/PLATELET
BASOS PCT: 1 %
Basophils Absolute: 0 10*3/uL (ref 0.0–0.1)
EOS ABS: 0.2 10*3/uL (ref 0.0–0.7)
EOS PCT: 3 %
HCT: 40.3 % (ref 36.0–46.0)
HEMOGLOBIN: 13.1 g/dL (ref 12.0–15.0)
Lymphocytes Relative: 41 %
Lymphs Abs: 2.2 10*3/uL (ref 0.7–4.0)
MCH: 33.5 pg (ref 26.0–34.0)
MCHC: 32.5 g/dL (ref 30.0–36.0)
MCV: 103.1 fL — ABNORMAL HIGH (ref 78.0–100.0)
MONO ABS: 0.5 10*3/uL (ref 0.1–1.0)
MONOS PCT: 9 %
NEUTROS PCT: 46 %
Neutro Abs: 2.5 10*3/uL (ref 1.7–7.7)
Platelets: 210 10*3/uL (ref 150–400)
RBC: 3.91 MIL/uL (ref 3.87–5.11)
RDW: 14.2 % (ref 11.5–15.5)
WBC: 5.4 10*3/uL (ref 4.0–10.5)

## 2018-01-19 LAB — COMPREHENSIVE METABOLIC PANEL
ALBUMIN: 3.8 g/dL (ref 3.5–5.0)
ALT: 18 U/L (ref 14–54)
ANION GAP: 8 (ref 5–15)
AST: 23 U/L (ref 15–41)
Alkaline Phosphatase: 63 U/L (ref 38–126)
BUN: 21 mg/dL — AB (ref 6–20)
CALCIUM: 8.9 mg/dL (ref 8.9–10.3)
CO2: 27 mmol/L (ref 22–32)
CREATININE: 1.69 mg/dL — AB (ref 0.44–1.00)
Chloride: 104 mmol/L (ref 101–111)
GFR calc non Af Amer: 28 mL/min — ABNORMAL LOW (ref >60)
GFR, EST AFRICAN AMERICAN: 33 mL/min — AB (ref >60)
Glucose, Bld: 95 mg/dL (ref 65–99)
Potassium: 4.1 mmol/L (ref 3.5–5.1)
SODIUM: 139 mmol/L (ref 135–145)
TOTAL PROTEIN: 6.5 g/dL (ref 6.5–8.1)
Total Bilirubin: 0.6 mg/dL (ref 0.3–1.2)

## 2018-01-19 LAB — TROPONIN I
Troponin I: <0.03 ng/mL (ref <0.03)
Troponin I: <0.03 ng/mL (ref <0.03)

## 2018-01-19 LAB — LIPASE, BLOOD: LIPASE: 25 U/L (ref 11–51)

## 2018-01-19 NOTE — Discharge Instructions (Addendum)
You were seen today for chest pain.  Your testing was very atypical for heart related pain.  This may be related to ongoing reflux.  Follow-up with your primary physician, cardiologist.  If you do not have a gastroenterologist, follow-up with Dr. Jena Gaussourk.

## 2018-01-19 NOTE — ED Provider Notes (Signed)
[facility] EMERGENCY DEPARTMENT Provider Note   CSN: [id number] Arrival date & time: 01/19/18  0118     History   Chief Complaint Chief Complaint  Patient presents with  . Chest Pain    HPI Hailey [name] is a 77 y.o. female.  HPI  This is a 77 year old female with a history of hypertension, hyperlipidemia who presents with chest pain.  Patient reports that she awoke at 11:30 PM with central burning type chest pain.  She states "it feels like a coal is in my chest."  Denies radiation.  She is currently pain-free.  Denies nausea, vomiting, shortness of breath, cough, fevers.  Reports recent similar symptoms specifically at night over the last week.  She did see her primary physician on Saturday.  He recommend that she take Zantac in addition to pantoprazole at home.  Denies any leg swelling or history of blood clots.  Patient did not take anything tonight for her symptoms and they self resolved.  She reports significant history and cardiac work-up recently including a stress test in November 2018.  From Dr. Sharene Skeans Recent note and chart review:  "Nuclear stress test was low risk on 07/03/17, LVEF 80%.  She was evaluated for chest pain in the ED on 07/20/17 and workup was unremarkable.  Coronary angiography on 04/12/12 demonstrated very minor nonobstructive coronary artery disease with normal left ventricular function. There was 10-20% mid vessel irregularities in the LAD, 20% proximal left circumflex stenosis, and LVEF 55-65%.  This was performed by Dr. [place].  Echocardiogram 05/20/17: Normal left ventricular systolic and diastolic function with normal regional wall motion, LVEF 60-65%, mild aortic and tricuspid and moderate mitral regurgitation."   Past Medical History:  Diagnosis Date  . Breast nodule 11/16/2013  . H/O hematuria   . HLD (hyperlipidemia)   . Hypertension   . 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 Palpitations   . Restless leg syndrome   .  Vaginal atrophy     Patient Active Problem List   Diagnosis Date Noted  . Vaginal dryness 01/28/2017  . Vaginal atrophy 01/28/2017  . Breast nodule 11/16/2013  . Breast mass, right 12/22/2012  . OVERWEIGHT 04/13/2009  . GENERALIZED ANXIETY DISORDER 04/13/2009  . CORONARY ARTERY DISEASE 04/13/2009  . PALPITATIONS 04/03/2009    Past Surgical History:  Procedure Laterality Date  . APPENDECTOMY    . CARDIAC CATHETERIZATION  2005    cone  . COLONOSCOPY    . HERNIA REPAIR    . LEFT HEART CATHETERIZATION WITH CORONARY ANGIOGRAM N/A 04/12/2012   Procedure: LEFT HEART CATHETERIZATION WITH CORONARY ANGIOGRAM;  Surgeon: Kathleene Hazel, MD;  Location: [facility] CATH LAB;  Service: Cardiovascular;  Laterality: N/A;  . TOTAL ABDOMINAL HYSTERECTOMY       OB History    Gravida  1   Para      Term      Preterm      AB  1   Living  0     SAB  1   TAB      Ectopic      Multiple      Live Births               Home Medications    Prior to Admission medications   Medication Sig Start Date End Date Taking? Authorizing Provider  acetaminophen (TYLENOL) 650 MG CR tablet Take 650-1,300 mg by mouth See admin instructions. Take 2 tablets every morning and take 1 tablet  at night    [provider]  amiodarone (PACERONE) 200 MG tablet Take 200 mg by mouth daily.    [provider]  amiodarone (PACERONE) 200 MG tablet TAKE 1 TABLET (200 MG TOTAL) DAILY BY MOUTH. 01/11/18 02/10/18  Laqueta Linden, MD  amLODipine (NORVASC) 5 MG tablet Take 1 tablet (5 mg total) by mouth daily. 06/29/17   Laqueta Linden, MD  apixaban (ELIQUIS) 5 MG TABS tablet Take 5 mg by mouth 2 (two) times daily.    [provider]  Calcium Carbonate-Vit D-Min 600-400 MG-UNIT TABS Take 1 tablet by mouth daily.     [provider]  clonazepam (KLONOPIN) 0.125 MG disintegrating tablet Take 0.125 mg by mouth at bedtime. 01/12/15   [provider]  doxycycline  (VIBRAMYCIN) 100 MG capsule Take 100 mg by mouth 2 (two) times daily. 10/28/17   [provider]  fluticasone (FLONASE) 50 MCG/ACT nasal spray Place 2 sprays into both nostrils daily as needed for allergies or rhinitis.    [provider]  loratadine (CLARITIN) 10 MG tablet Take 10 mg by mouth daily as needed for allergies.    [provider]  meclizine (ANTIVERT) 25 MG tablet Take 25 mg by mouth as needed for dizziness.    [provider]  Multiple Vitamins-Minerals (CENTRUM SILVER) tablet Take 1 tablet by mouth daily.      [provider]  pantoprazole (PROTONIX) 40 MG tablet Take 40 mg by mouth 2 (two) times daily. 10/05/17   [provider]    Family History Family History  Problem Relation Age of Onset  . Coronary artery disease Father        mid 72s  . Hypertension Mother   . Stroke Brother   . Transient ischemic attack Brother   . Other Brother        on oxygen  . Hypertension Brother   . Stroke Brother   . Heart disease Brother   . Heart attack Brother   . Other Sister        a fib, restless leg  . Other Sister        pressure in eyes    Social History Social History   Tobacco Use  . Smoking status: Never Smoker  . Smokeless tobacco: Never Used  Substance Use Topics  . Alcohol use: Yes    Comment: rarely; glass of wine with dinner  . Drug use: No     Allergies   Flomax [tamsulosin hcl]   Review of Systems Review of Systems  Constitutional: Negative for fever.  Respiratory: Negative for shortness of breath.   Cardiovascular: Positive for chest pain. Negative for leg swelling.  Gastrointestinal: Negative for abdominal pain, diarrhea, nausea and vomiting.  Genitourinary: Negative for flank pain.  Musculoskeletal: Negative for back pain.  All other systems reviewed and are negative.    Physical Exam Updated Vital Signs BP (!) 141/70   Pulse 65   Temp 97.7 F (36.5 C) (Oral)   Resp 18   SpO2 98%    Physical Exam  Constitutional: She is oriented to person, place, and time. She appears well-developed and well-nourished. She does not appear ill.  HENT:  Head: Normocephalic and atraumatic.  Eyes: Pupils are equal, round, and reactive to light.  Neck: Neck supple.  Cardiovascular: Normal rate, regular rhythm, normal heart sounds and normal pulses.  No murmur heard. Pulmonary/Chest: Effort normal. No respiratory distress. She has no wheezes.  Abdominal: Soft. Bowel sounds are  normal.  Musculoskeletal:       Right lower leg: She exhibits no edema.       Left lower leg: She exhibits no edema.  Neurological: She is alert and oriented to person, place, and time.  Skin: Skin is warm and dry.  Psychiatric: She has a normal mood and affect.  Nursing note and vitals reviewed.    ED Treatments / Results  Labs (all labs ordered are listed, but only abnormal results are displayed) Labs Reviewed  CBC WITH DIFFERENTIAL/PLATELET - Abnormal; Notable for the following components:      Result Value   MCV 103.1 (*)    All other components within normal limits  COMPREHENSIVE METABOLIC PANEL - Abnormal; Notable for the following components:   BUN 21 (*)    Creatinine, Ser 1.69 (*)    GFR calc non Af Amer 28 (*)    GFR calc Af Amer 33 (*)    All other components within normal limits  LIPASE, BLOOD  TROPONIN I  TROPONIN I    EKG EKG Interpretation  Date/Time:  Tuesday January 19 2018 01:27:03 EDT Ventricular Rate:  73 PR Interval:    QRS Duration: 131 QT Interval:  446 QTC Calculation: 492 R Axis:   88 Text Interpretation:  Sinus rhythm Right bundle branch block Right bundle morphology new when compared to prior Confirmed by Ross Marcus ([id number]) on 01/19/2018 1:32:20 AM   Radiology Dg Chest 2 View  Result Date: 01/19/2018 CLINICAL DATA:  Patient woke from sleep with chest pain. Cough. History of breast nodule and hypertension. EXAM: CHEST - 2 VIEW COMPARISON:  07/20/2017  FINDINGS: Hyperinflation. Lungs are clear and expanded. Heart size and pulmonary vascularity are normal. Mediastinal contours appear intact. No blunting of costophrenic angles. No pneumothorax. IMPRESSION: Pulmonary hyperinflation.  No evidence of active pulmonary disease. Electronically Signed   By: Burman Nieves M.D.   On: 01/19/2018 01:55    Procedures Procedures (including critical care time)  Medications Ordered in ED Medications - No data to display   Initial Impression / Assessment and Plan / ED Course  I have reviewed the triage vital signs and the nursing notes.  Pertinent labs & imaging results that were available during my care of the patient were reviewed by me and considered in my medical decision making (see chart for details).     She presents with chest pain.  She is overall nontoxic on exam.  Pain has resolved.  Vital signs reassuring.  Pain is very atypical.  Most characteristic for GI pathology; however, patient does have risk factors for heart disease.  She has been thoroughly worked up in the past.  EKG shows no signs of acute ischemia however she does have some new right bundle morphology.  Pain has self resolved.  Troponin x2-.  Basic lab work otherwise reassuring.  Patient has not had a recurrence of pain.  High suspicion for GI etiology.  She is Artie on pantoprazole 40 mg twice daily.  Close follow-up with primary physician, cardiology, and GI.  Patient stated understanding.  After history, exam, and medical workup I feel the patient has been appropriately medically screened and is safe for discharge home. Pertinent diagnoses were discussed with the patient. Patient was given return precautions.   Final Clinical Impressions(s) / ED Diagnoses   Final diagnoses:  Atypical chest pain  Gastroesophageal reflux disease, esophagitis presence not specified    ED Discharge Orders    None       Lorelie Biermann, Toni Amend  F, MD 01/19/18 [id number]

## 2018-01-19 NOTE — ED Triage Notes (Signed)
Pt states she was woken up from sleep with chest pain about 1 hour ago. Pt states she saw her PCP on Saturday for the same and was told to take her pantoprazole when this occurs. Pt states after the pantoprazole the pain has "eased off but I still feel yucky."

## 2018-02-02 ENCOUNTER — Other Ambulatory Visit: Payer: Self-pay | Admitting: Cardiovascular Disease

## 2018-02-28 ENCOUNTER — Other Ambulatory Visit: Payer: Self-pay | Admitting: Cardiovascular Disease

## 2018-03-05 ENCOUNTER — Encounter: Payer: Self-pay | Admitting: Cardiovascular Disease

## 2018-03-05 ENCOUNTER — Ambulatory Visit (INDEPENDENT_AMBULATORY_CARE_PROVIDER_SITE_OTHER): Payer: Medicare Other

## 2018-03-05 ENCOUNTER — Ambulatory Visit: Payer: Medicare Other | Admitting: Cardiovascular Disease

## 2018-03-05 VITALS — BP 144/74 | HR 56 | Ht 67.0 in | Wt 193.0 lb

## 2018-03-05 DIAGNOSIS — K449 Diaphragmatic hernia without obstruction or gangrene: Secondary | ICD-10-CM

## 2018-03-05 DIAGNOSIS — I480 Paroxysmal atrial fibrillation: Secondary | ICD-10-CM

## 2018-03-05 DIAGNOSIS — Z79899 Other long term (current) drug therapy: Secondary | ICD-10-CM

## 2018-03-05 DIAGNOSIS — I38 Endocarditis, valve unspecified: Secondary | ICD-10-CM

## 2018-03-05 DIAGNOSIS — R002 Palpitations: Secondary | ICD-10-CM

## 2018-03-05 DIAGNOSIS — I10 Essential (primary) hypertension: Secondary | ICD-10-CM

## 2018-03-05 NOTE — Progress Notes (Signed)
SUBJECTIVE: The patient presents for follow-up of chest pain and atrial fibrillation.  She has had several evaluations in the ED for chest pain.  Nuclear stress test was low risk on 07/03/17, LVEF 80%.  Coronary angiography on 04/12/12 demonstrated very minor nonobstructive coronary artery disease with normal left ventricular function. There was 10-20% mid vessel irregularities in the LAD, 20% proximal left circumflex stenosis, and LVEF 55-65%.  This was performed by Dr. SwazilandJordan.  Echocardiogram 05/20/17: Normal left ventricular systolic and diastolic function with normal regional wall motion, LVEF 60-65%, mild aortic and tricuspid and moderate mitral regurgitation.  She was again evaluated in the ED for chest pain on 01/19/2018.  It appeared atypical as per ED physician notes.  It appeared to be most characteristic for a GI pathology.  Troponins were normal.  Chest x-ray showed pulmonary hyperinflation.  I personally reviewed the ECG which demonstrated sinus rhythm with a right bundle branch block and T wave inversions in leads III and aVF, V3 and V4, and nonspecific T wave abnormalities in V5 and V6.  [facility] demonstrated a hiatal hernia.  Her PCP added Zantac to Protonix and her chest pain has resolved.  She has been expensing palpitations over the last 6 weeks which have awoken her at night.  They are alleviated with either sitting up or changing her position in bed.  She has headaches associated with nausea and has a prior history of migraines.  Symptoms appear to be controlled with Tylenol and occasionally with the addition of meclizine.  I reviewed labs dated 01/28/2018: TSH mildly elevated at 6.08, total cholesterol 187, triglycerides 111, HDL 72, LDL 93, white blood cells 5.1, hemoglobin and 13.4, platelets 256, sodium 144, potassium 5.2, BUN 15, creatinine elevated at 1.57, AST 23, ALT 14.    Review of Systems: As per  "subjective", otherwise negative.  Allergies  Allergen Reactions  . Flomax [Tamsulosin Hcl] Nausea And Vomiting    Current Outpatient Medications  Medication Sig Dispense Refill  . acetaminophen (TYLENOL) 650 MG CR tablet Take 650-1,300 mg by mouth See admin instructions. Take 2 tablets every morning and take 1 tablet at night    . amiodarone (PACERONE) 200 MG tablet TAKE 1 TABLET BY MOUTH EVERY DAY 30 tablet 1  . amLODipine (NORVASC) 5 MG tablet Take 1 tablet (5 mg total) by mouth daily. 30 tablet 6  . apixaban (ELIQUIS) 5 MG TABS tablet Take 5 mg by mouth 2 (two) times daily.    . Calcium Carbonate-Vit D-Min 600-400 MG-UNIT TABS Take 1 tablet by mouth daily.     . clonazepam (KLONOPIN) 0.125 MG disintegrating tablet Take 0.125 mg by mouth at bedtime.  5  . fluticasone (FLONASE) 50 MCG/ACT nasal spray Place 2 sprays into both nostrils daily as needed for allergies or rhinitis.    Marland Kitchen. loratadine (CLARITIN) 10 MG tablet Take 10 mg by mouth daily as needed for allergies.    Marland Kitchen. meclizine (ANTIVERT) 25 MG tablet Take 25 mg by mouth as needed for dizziness.    . Multiple Vitamins-Minerals (CENTRUM SILVER) tablet Take 1 tablet by mouth daily.      . pantoprazole (PROTONIX) 40 MG tablet Take 40 mg by mouth 2 (two) times daily.  1  . ranitidine (ZANTAC) 150 MG tablet Take 150 mg by mouth 2 (two) times daily.  1   No current facility-administered medications for this visit.     Past Medical History:  Diagnosis Date  . Breast nodule 11/16/2013  . H/O hematuria   . HLD (hyperlipidemia)   . Hypertension   . 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 Palpitations   . Restless leg syndrome   . Vaginal atrophy     Past Surgical History:  Procedure Laterality Date  . APPENDECTOMY    . CARDIAC CATHETERIZATION  2005    cone  . COLONOSCOPY    . HERNIA REPAIR    . LEFT HEART CATHETERIZATION WITH CORONARY ANGIOGRAM N/A 04/12/2012   Procedure: LEFT HEART CATHETERIZATION WITH CORONARY ANGIOGRAM;   Surgeon: Kathleene Hazel, MD;  Location: [facility] CATH LAB;  Service: Cardiovascular;  Laterality: N/A;  . TOTAL ABDOMINAL HYSTERECTOMY      Social History   Socioeconomic History  . Marital status: Single    Spouse name: Not on file  . Number of children: 0  . Years of education: College  . Highest education level: Not on file  Occupational History  . Occupation: retired  Engineer, production  . Financial resource strain: Not on file  . Food insecurity:    Worry: Not on file    Inability: Not on file  . Transportation needs:    Medical: Not on file    Non-medical: Not on file  Tobacco Use  . Smoking status: Never Smoker  . Smokeless tobacco: Never Used  Substance and Sexual Activity  . Alcohol use: Yes    Comment: rarely; glass of wine with dinner  . Drug use: No  . Sexual activity: Never    Birth control/protection: Surgical    Comment: hyst  Lifestyle  . Physical activity:    Days per week: Not on file    Minutes per session: Not on file  . Stress: Not on file  Relationships  . Social connections:    Talks on phone: Not on file    Gets together: Not on file    Attends religious service: Not on file    Active member of club or organization: Not on file    Attends meetings of clubs or organizations: Not on file    Relationship status: Not on file  . Intimate partner violence:    Fear of current or ex partner: Not on file    Emotionally abused: Not on file    Physically abused: Not on file    Forced sexual activity: Not on file  Other Topics Concern  . Not on file  Social History Narrative   Single. Retired.    Patient lives at home alone.   Caffeine Use: none     Vitals:   03/05/18 1058  BP: (!) 144/74  Pulse: (!) 56  SpO2: 99%  Weight: 193 lb (87.5 kg)  Height: 5\' 7"  (1.702 m)    Wt Readings from Last 3 Encounters:  03/05/18 193 lb (87.5 kg)  12/06/17 190 lb (86.2 kg)  08/31/17 194 lb (88 kg)     PHYSICAL EXAM General: NAD HEENT: Normal. Neck:  No JVD, no thyromegaly. Lungs: Clear to auscultation bilaterally with normal respiratory effort. CV: Regular rate and rhythm, normal S1/S2, no S3/S4, no murmur. No pretibial or periankle edema.  No carotid bruit.   Abdomen: Soft, nontender, no distention.  Neurologic: Alert and oriented.  Psych: Normal affect. Skin: Normal. Musculoskeletal: No gross deformities.    ECG: Reviewed above under Subjective   Labs: Lab Results  Component Value Date/Time   K 4.1 01/19/2018 02:01 AM   BUN 21 (H)  01/19/2018 02:01 AM   BUN 16 10/21/2013 01:10 PM   CREATININE 1.69 (H) 01/19/2018 02:01 AM   ALT 18 01/19/2018 02:01 AM   TSH 3.031 08/31/2017 11:59 AM   TSH 3.010 10/21/2013 01:10 PM   HGB 13.1 01/19/2018 02:01 AM     Lipids: No results found for: LDLCALC, LDLDIRECT, CHOL, TRIG, HDL     ASSESSMENT AND PLAN: 1. Paroxysmal atrial fibrillation with increased palpitations: I will obtain a 1 week event monitor to see if she is experiencing breakthrough atrial fibrillation. Continue amiodarone for maintenance of sinus rhythm and continue Eliquis for anticoagulation.    TSH and liver transaminases were reviewed above.  TSH is mildly elevated and due to be repeated in October by her PCP.  2. Chest pain:  She has had several recurrences which clinically appeared atypical for cardiac etiology.  Nuclear stress test was low risk as detailed above.    Symptoms are not controlled with the addition of Zantac to Protonix.  She also has a hiatal hernia.  3. Valvular heart disease: Stable.  No significant murmurs on exam today. I will monitor clinically and with surveillance echocardiography.  4. Essential hypertension: Blood pressure is elevated today but normal at last visit.  She checks it at home and systolic readings are in the 120 range I will monitor.    Disposition: Follow up 3 months  Prentice Docker, M.D., F.A.C.C.

## 2018-03-05 NOTE — Patient Instructions (Signed)
Your physician recommends that you schedule a follow-up appointment in: 3 MONTHS WITH DR Purvis SheffieldKONESWARAN  Your physician recommends that you continue on your current medications as directed. Please refer to the Current Medication list given to you today.  Your physician has recommended that you wear an event monitor FOR 1 WEEK. Event monitors are medical devices that record the heart's electrical activity. Doctors most often us these monitors to diagnose arrhythmias. Arrhythmias are problems with the speed or rhythm of the heartbeat. The monitor is a small, portable device. You can wear one while you do your normal daily activities. This is usually used to diagnose what is causing palpitations/syncope (passing out).  Thank you for choosing [facility]!!

## 2018-03-07 ENCOUNTER — Other Ambulatory Visit: Payer: Self-pay

## 2018-03-07 ENCOUNTER — Encounter (HOSPITAL_COMMUNITY): Payer: Self-pay | Admitting: Emergency Medicine

## 2018-03-07 ENCOUNTER — Emergency Department (HOSPITAL_COMMUNITY): Payer: Medicare Other

## 2018-03-07 ENCOUNTER — Emergency Department (HOSPITAL_COMMUNITY)
Admission: EM | Admit: 2018-03-07 | Discharge: 2018-03-07 | Disposition: A | Discharge status: Home / Self Care | Payer: Medicare Other | Attending: Emergency Medicine | Admitting: Emergency Medicine

## 2018-03-07 DIAGNOSIS — Z79899 Other long term (current) drug therapy: Secondary | ICD-10-CM | POA: Insufficient documentation

## 2018-03-07 DIAGNOSIS — R51 Headache: Secondary | ICD-10-CM | POA: Insufficient documentation

## 2018-03-07 DIAGNOSIS — Z7901 Long term (current) use of anticoagulants: Secondary | ICD-10-CM | POA: Diagnosis not present

## 2018-03-07 DIAGNOSIS — I2510 Atherosclerotic heart disease of native coronary artery without angina pectoris: Secondary | ICD-10-CM | POA: Insufficient documentation

## 2018-03-07 DIAGNOSIS — R220 Localized swelling, mass and lump, head: Secondary | ICD-10-CM | POA: Diagnosis not present

## 2018-03-07 DIAGNOSIS — R519 Headache, unspecified: Secondary | ICD-10-CM

## 2018-03-07 DIAGNOSIS — I10 Essential (primary) hypertension: Secondary | ICD-10-CM | POA: Diagnosis not present

## 2018-03-07 HISTORY — DX: Migraine, unspecified, not intractable, without status migrainosus: G43.909

## 2018-03-07 MED ORDER — FAMOTIDINE 20 MG PO TABS
40.0000 mg | ORAL_TABLET | Freq: Once | ORAL | Status: AC
  Administered 2018-03-07: 40 mg via ORAL
  Filled 2018-03-07: qty 2

## 2018-03-07 MED ORDER — ACETAMINOPHEN 325 MG PO TABS
650.0000 mg | ORAL_TABLET | Freq: Once | ORAL | Status: AC
  Administered 2018-03-07: 650 mg via ORAL
  Filled 2018-03-07: qty 2

## 2018-03-07 MED ORDER — DIPHENHYDRAMINE HCL 25 MG PO CAPS
25.0000 mg | ORAL_CAPSULE | Freq: Once | ORAL | Status: AC
  Administered 2018-03-07: 25 mg via ORAL
  Filled 2018-03-07: qty 1

## 2018-03-07 MED ORDER — PREDNISONE 20 MG PO TABS: 40.0000 mg | ORAL_TABLET | Freq: Every day | ORAL | 0 refills | Status: DC

## 2018-03-07 MED ORDER — PREDNISONE 50 MG PO TABS
60.0000 mg | ORAL_TABLET | Freq: Once | ORAL | Status: AC
  Administered 2018-03-07: 60 mg via ORAL
  Filled 2018-03-07: qty 1

## 2018-03-07 NOTE — ED Notes (Signed)
EDP at bedside updating patient and family. 

## 2018-03-07 NOTE — ED Provider Notes (Signed)
[facility] EMERGENCY DEPARTMENT Provider Note   CSN: [id number] Arrival date & time: 03/07/18  6578     History   Chief Complaint Chief Complaint  Patient presents with  . Facial Swelling    HPI Hailey [name] is a 77 y.o. female.     Pt was seen at 0845. Per pt, c/o unknown onset and gradual improvement of constant right sided face "feeling puffy," "burning" and "itching" that she noticed when she woke up this morning. Pt states her right face, nose and around her lips "felt puffy," "burning," and "itching."  Pt took one OTC benadryl PTA with partial improvement. Pt states she now "just has a headache." Endorses hx of chronic headaches.  Describes the headache as per her usual chronic headache pain pattern.  Denies headache was sudden or maximal in onset or at any time.  Denies visual changes, no eye pain, no temporal scalp pain, no ear pain, no teeth pain, no focal motor weakness, no tingling/numbness in face or extremities, no slurred speech, no ataxia, no facial droop, no intra-oral edema, no fevers, no neck pain, no rash, no CP/SOB, no wheezing/stridor.      Past Medical History:  Diagnosis Date  . Breast nodule 11/16/2013  . H/O hematuria   . HLD (hyperlipidemia)   . Hypertension   . 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 Migraine headache   . Palpitations   . Restless leg syndrome   . Vaginal atrophy     Patient Active Problem List   Diagnosis Date Noted  . Vaginal dryness 01/28/2017  . Vaginal atrophy 01/28/2017  . Breast nodule 11/16/2013  . Breast mass, right 12/22/2012  . OVERWEIGHT 04/13/2009  . GENERALIZED ANXIETY DISORDER 04/13/2009  . CORONARY ARTERY DISEASE 04/13/2009  . PALPITATIONS 04/03/2009    Past Surgical History:  Procedure Laterality Date  . APPENDECTOMY    . CARDIAC CATHETERIZATION  2005    cone  . COLONOSCOPY    . HERNIA REPAIR    . LEFT HEART CATHETERIZATION WITH CORONARY ANGIOGRAM N/A 04/12/2012   Procedure: LEFT HEART  CATHETERIZATION WITH CORONARY ANGIOGRAM;  Surgeon: Kathleene Hazel, MD;  Location: [facility] CATH LAB;  Service: Cardiovascular;  Laterality: N/A;  . TOTAL ABDOMINAL HYSTERECTOMY       OB History    Gravida  1   Para      Term      Preterm      AB  1   Living  0     SAB  1   TAB      Ectopic      Multiple      Live Births               Home Medications    Prior to Admission medications   Medication Sig Start Date End Date Taking? Authorizing Provider  acetaminophen (TYLENOL) 650 MG CR tablet Take 650-1,300 mg by mouth See admin instructions. Take 2 tablets every morning and take 1 tablet at night    [provider]  amiodarone (PACERONE) 200 MG tablet TAKE 1 TABLET BY MOUTH EVERY DAY 03/01/18   Laqueta Linden, MD  amLODipine (NORVASC) 5 MG tablet Take 1 tablet (5 mg total) by mouth daily. 06/29/17   Laqueta Linden, MD  apixaban (ELIQUIS) 5 MG TABS tablet Take 5 mg by mouth 2 (two) times daily.    [provider]  Calcium Carbonate-Vit D-Min 600-400 MG-UNIT TABS Take 1 tablet by  mouth daily.     [provider]  clonazepam (KLONOPIN) 0.125 MG disintegrating tablet Take 0.125 mg by mouth at bedtime. 01/12/15   [provider]  fluticasone (FLONASE) 50 MCG/ACT nasal spray Place 2 sprays into both nostrils daily as needed for allergies or rhinitis.    [provider]  loratadine (CLARITIN) 10 MG tablet Take 10 mg by mouth daily as needed for allergies.    [provider]  meclizine (ANTIVERT) 25 MG tablet Take 25 mg by mouth as needed for dizziness.    [provider]  Multiple Vitamins-Minerals (CENTRUM SILVER) tablet Take 1 tablet by mouth daily.      [provider]  pantoprazole (PROTONIX) 40 MG tablet Take 40 mg by mouth 2 (two) times daily. 10/05/17   [provider]  ranitidine (ZANTAC) 150 MG tablet Take 150 mg by mouth 2 (two) times daily. 01/16/18   [provider]      Family History Family History  Problem Relation Age of Onset  . Coronary artery disease Father        mid 67s  . Hypertension Mother   . Stroke Brother   . Transient ischemic attack Brother   . Other Brother        on oxygen  . Hypertension Brother   . Stroke Brother   . Heart disease Brother   . Heart attack Brother   . Other Sister        a fib, restless leg  . Other Sister        pressure in eyes    Social History Social History   Tobacco Use  . Smoking status: Never Smoker  . Smokeless tobacco: Never Used  Substance Use Topics  . Alcohol use: Yes    Comment: rarely; glass of wine with dinner  . Drug use: No     Allergies   Flomax [tamsulosin hcl]   Review of Systems Review of Systems ROS: Statement: All systems negative except as marked or noted in the HPI; Constitutional: Negative for fever and chills. ; ; Eyes: Negative for eye pain, redness and discharge. ; ; ENMT: Negative for ear pain, hoarseness, nasal congestion, sinus pressure and sore throat. ; ; Cardiovascular: Negative for chest pain, palpitations, diaphoresis, dyspnea and peripheral edema. ; ; Respiratory: Negative for cough, wheezing and stridor. ; ; Gastrointestinal: Negative for nausea, vomiting, diarrhea, abdominal pain, blood in stool, hematemesis, jaundice and rectal bleeding. . ; ; Genitourinary: Negative for dysuria, flank pain and hematuria. ; ; Musculoskeletal: Negative for back pain and neck pain. Negative for swelling and trauma.; ; Skin: Negative for pruritus, rash, abrasions, blisters, bruising and skin lesion.; ; Neuro: +chronic headache, burning/itching right face. Negative for lightheadedness and neck stiffness. Negative for weakness, altered level of consciousness, altered mental status, extremity weakness, involuntary movement, seizure and syncope.       Physical Exam Updated Vital Signs BP (!) 152/82 (BP Location: Right Arm)   Pulse 66   Temp 97.8 F (36.6 C) (Oral)   Resp 16    Ht 5\' 7"  (1.702 m)   Wt 87.5 kg (193 lb)   SpO2 97%   BMI 30.23 kg/m   Physical Exam 0850; Physical examination:  Nursing notes reviewed; Vital signs and O2 SAT reviewed;  Constitutional: Well developed, Well nourished, Well hydrated, In no acute distress; Head:  Normocephalic, atraumatic. No facial edema, no rash. No temporal scalp tenderness..; Eyes: EOMI, PERRL, No scleral icterus; ENMT: Mouth and pharynx normal, Mucous  membranes moist. TM's clear bilat. +edemetous nasal turbinates bilat with clear rhinorrhea. Mouth and pharynx without lesions. No tonsillar exudates. No intra-oral edema. No submandibular or sublingual edema. No hoarse voice, no drooling, no stridor. No trismus. ; Neck: Supple, Full range of motion, No lymphadenopathy; Cardiovascular: Regular rate and rhythm, No gallop; Respiratory: Breath sounds clear & equal bilaterally, No wheezes.  Speaking full sentences with ease, Normal respiratory effort/excursion; Chest: Nontender, Movement normal; Abdomen: Soft, Nontender, Nondistended, Normal bowel sounds; Genitourinary: No CVA tenderness; Extremities: Peripheral pulses normal, No tenderness, No edema, No calf edema or asymmetry.; Neuro: AA&Ox3, Major CN grossly intact. No facial droop. Speech clear. No gross focal motor or sensory deficits in extremities. Climbs on and off stretcher easily by herself. Gait steady..; Skin: Color normal, Warm, Dry.   ED Treatments / Results  Labs (all labs ordered are listed, but only abnormal results are displayed)   EKG None  Radiology   Procedures Procedures (including critical care time)  Medications Ordered in ED Medications  diphenhydrAMINE (BENADRYL) capsule 25 mg (has no administration in time range)  predniSONE (DELTASONE) tablet 60 mg (has no administration in time range)  famotidine (PEPCID) tablet 40 mg (has no administration in time range)  acetaminophen (TYLENOL) tablet 650 mg (has no administration in time range)      Initial Impression / Assessment and Plan / ED Course  I have reviewed the triage vital signs and the nursing notes.  Pertinent labs & imaging results that were available during my care of the patient were reviewed by me and considered in my medical decision making (see chart for details).  MDM Reviewed: previous chart, nursing note and vitals Interpretation: CT scan   Ct Head Wo Contrast Result Date: 03/07/2018 CLINICAL DATA:  Right-sided facial pain and puffiness EXAM: CT HEAD WITHOUT CONTRAST TECHNIQUE: Contiguous axial images were obtained from the base of the skull through the vertex without intravenous contrast. COMPARISON:  12/06/2017 FINDINGS: Brain: No evidence of acute infarction, hemorrhage, hydrocephalus, extra-axial collection or mass lesion/mass effect. Vascular: No hyperdense vessel or unexpected calcification. Skull: Normal. Negative for fracture or focal lesion. Sinuses/Orbits: No acute finding. Other: None. IMPRESSION: No acute abnormality noted.  No change from the prior exam. Electronically Signed   By: Alcide CleverMark  Lukens M.D.   On: 03/07/2018 10:27    1130:  Pt woke up with right sided facial pain/puffiness/burning/itching. No numbness, no muscle weakness. No intra-oral symptoms. No ear pain, no eye pain, no scalp pain. Pt has hx headaches, denies change in usual headache today. Neuro exam remains intact/unchanged. Feels better after meds. Pt endorses compliance with Eliquis.  Doubt stroke, SAH, glaucoma, temporal arteritis at this time. Possibilities of early Bells palsy or shingles explained to pt and friend; s/s reviewed, both verb understanding. Pt states she is ready to go home now. Continue to tx symptomatically at this time, f/u PMD. Dx and testing d/w pt and family.  Questions answered.  Verb understanding, agreeable to d/c home with outpt f/u.      Final Clinical Impressions(s) / ED Diagnoses   Final diagnoses:  None    ED Discharge Orders    None        Samuel JesterMcManus, Shaune Malacara, DO 03/12/18 [id number]

## 2018-03-07 NOTE — Discharge Instructions (Addendum)
Take the prescription as directed. Take over the counter benadryl, as directed on packaging, as needed for itching.  If the benadryl is too sedating, take an over the counter non-sedating antihistamine such as claritin, allegra or zyrtec, as directed on packaging. Call your regular medical doctor tomorrow to schedule a follow up appointment within the next 2 days. Call the Neurologist tomorrow to schedule a follow up appointment within the next week.  Return to the Emergency Department immediately sooner if worsening.

## 2018-03-07 NOTE — ED Triage Notes (Signed)
Pt reports waking up with the right side of her face feeling puffy and burning.  Took Benadryl pta.

## 2018-03-07 NOTE — ED Notes (Signed)
Pt returned from CT °

## 2018-03-07 NOTE — ED Notes (Signed)
Patient transported to CT 

## 2018-03-15 ENCOUNTER — Telehealth: Payer: Self-pay | Admitting: Cardiovascular Disease

## 2018-03-15 NOTE — Telephone Encounter (Signed)
Patient thinks she may be having side effects from the Amiodarone.  Headaches x 1 month with nausea when first getting up then goes away during the day.  ED was not sure what reaction may be - was given Prednisone & Claritin for most of the past week.  Tylenol does help the headache.  All these symptoms going on for months.  Was having all the same symptoms during the last office visit.  Been on the Amiodarone almost a year.  No chest pain, dizziness.  Does notice slight SOB with the nausea & headache episodes.  Did CT scan also to make sure she did not have a stroke - no stroke per patient.

## 2018-03-15 NOTE — Telephone Encounter (Signed)
Allergic reactions to medicaiton  Headaches  Nauseated went to ED because her face was red, puffy and itching  Hot flashes  Feet and ankles feels like major sun burn  Thyroid is elevated.

## 2018-03-16 NOTE — Telephone Encounter (Signed)
Patient notified and verbalized understanding. 

## 2018-03-16 NOTE — Telephone Encounter (Signed)
Please see additional information that patient called back with this morning.

## 2018-03-16 NOTE — Telephone Encounter (Signed)
Patient called back  Middle of June patient had a CT Scan on her stomach - Dr Reuel Boomaniel  Prescribed  Ranitidine (ZANTAC) 150 MG tablet She is thinking thinking this medication maybe causing her symptoms.  She took last night and had symptoms, but didn't take this morning and no symptoms.   She wants to try not taking a few days to make sure

## 2018-03-16 NOTE — Telephone Encounter (Signed)
ok 

## 2018-03-18 ENCOUNTER — Telehealth: Payer: Self-pay | Admitting: *Deleted

## 2018-03-18 NOTE — Telephone Encounter (Signed)
Continue Claritin and have her see dermatology.

## 2018-03-18 NOTE — Telephone Encounter (Signed)
Patient notified and verbalized understanding.  Stated that she has stopped the Zantac.  Was also doing some more thinking and has decided to stop the Pantoprazole twice a day to see if that makes any difference.  States that she will stop this medication now to see if this helps.  [facility] - 03/25/2018 - already has office visit scheduled.  Also informed her that she can take the Claritin daily if she needs to.

## 2018-03-18 NOTE — Telephone Encounter (Signed)
Patient calling back with c/o face being swollen, red, itchy & burning.  Feels like a bad sunburn.  Also, c/o feet & ankles burning like a bad sunburn.  Been going on x 2 weeks now.  Stated she did stop the Zantac, but that did not make any changes / no improvement.  Stated that she did take a Claritin this morning which has helped a little.  No c/o chest pain, dizziness, sob & no c/o tongue or throat swelling.  She did see her pmd last week that felt she may be having some reaction to one of her medicines, but did not stop anything.  Also, had ED visit for same problem & nothing was stopped at that time either.  Message fwd to provider for further suggestions.

## 2018-04-19 ENCOUNTER — Encounter: Payer: Self-pay | Admitting: Cardiovascular Disease

## 2018-04-19 ENCOUNTER — Ambulatory Visit: Payer: Medicare Other | Admitting: Cardiovascular Disease

## 2018-04-19 VITALS — BP 132/84 | HR 96 | Ht 67.0 in | Wt 197.4 lb

## 2018-04-19 DIAGNOSIS — I480 Paroxysmal atrial fibrillation: Secondary | ICD-10-CM | POA: Diagnosis not present

## 2018-04-19 DIAGNOSIS — R079 Chest pain, unspecified: Secondary | ICD-10-CM

## 2018-04-19 DIAGNOSIS — Z79899 Other long term (current) drug therapy: Secondary | ICD-10-CM

## 2018-04-19 DIAGNOSIS — I10 Essential (primary) hypertension: Secondary | ICD-10-CM | POA: Diagnosis not present

## 2018-04-19 DIAGNOSIS — I38 Endocarditis, valve unspecified: Secondary | ICD-10-CM | POA: Diagnosis not present

## 2018-04-19 MED ORDER — METOPROLOL SUCCINATE ER 25 MG PO TB24: 25.0000 mg | ORAL_TABLET | Freq: Every day | ORAL | 6 refills | Status: DC

## 2018-04-19 NOTE — Patient Instructions (Signed)
Medication Instructions:   Stop Amiodarone.  Begin Toprol XL 25mg  daily.   Continue all other medications.    Labwork: none  Testing/Procedures: none  Follow-Up: 3-4 months   Any Other Special Instructions Will Be Listed Below (If Applicable).  If you need a refill on your cardiac medications before your next appointment, please call your pharmacy.

## 2018-04-19 NOTE — Addendum Note (Signed)
Addended by: Lesle ChrisHILL, Ikhlas Albo G on: 04/19/2018 10:35 AM   Modules accepted: Orders

## 2018-04-19 NOTE — Progress Notes (Signed)
SUBJECTIVE: The patient presents for follow-up of chest pain and atrial fibrillation.  Her PCP referred her back to see me due to more symptomatic atrial fibrillation.  Nuclear stress test was low risk on 07/03/17, LVEF 80%.  Coronary angiography on 04/12/12 demonstrated very minor nonobstructive coronary artery disease with normal left ventricular function. There was 10-20% mid vessel irregularities in the LAD, 20% proximal left circumflex stenosis, and LVEF 55-65%.  This was performed by Dr. SwazilandJordan.  Echocardiogram 05/20/17: Normal left ventricular systolic and diastolic function with normal regional wall motion, LVEF 60-65%, mild aortic and tricuspid and moderate mitral regurgitation.  She has a history of a [facility]atal hernia [facility] chest [facility].  I reviewed labs dated 04/12/2018: BUN 18, creatinine 1.54, sodium 141, potassium 4.6, normal liver transaminases, TSH 5.58, negative ANA, uric acid 6, white blood cells 5.8, heme globin 14.2, platelets 280.  She has been feeling nauseous and unwell for several weeks.  She was at her sister's house getting her haircut and suddenly felt like she was going to pass out.  The other day after returning from a church meeting she said headlights look like halos.  She denies fevers and abdominal pain.  She has had some constipation but denies chest pain altogether.  She is only experienced palpitations this morning.  She previously developed a rash on her face and legs but this is resolving.  I reviewed the event monitor which I ordered at her last visit.  It demonstrated an average heart rate of 64 bpm with PACs but no evidence of atrial fibrillation whatsoever.    Review of Systems: As per "subjective", otherwise negative.  Allergies  Allergen Reactions  . Flomax [Tamsulosin Hcl] Nausea And Vomiting    Current Outpatient Medications  Medication Sig Dispense Refill  . acetaminophen (TYLENOL) 650 MG CR tablet Take 650-1,300 mg by  mouth See admin instructions. Take 2 tablets every morning and take 1 tablet at night    . amiodarone (PACERONE) 200 MG tablet TAKE 1 TABLET BY MOUTH EVERY DAY 30 tablet 1  . amLODipine (NORVASC) 5 MG tablet Take 1 tablet (5 mg total) by mouth daily. 30 tablet 6  . apixaban (ELIQUIS) 5 MG TABS tablet Take 5 mg by mouth 2 (two) times daily.    . Calcium Carbonate-Vit D-Min 600-400 MG-UNIT TABS Take 1 tablet by mouth daily.     . clonazepam (KLONOPIN) 0.125 MG disintegrating tablet Take 0.125 mg by mouth at bedtime.  5  . fluticasone (FLONASE) 50 MCG/ACT nasal spray Place 2 sprays into both nostrils daily as needed for allergies or rhinitis.    Marland Kitchen. loratadine (CLARITIN) 10 MG tablet Take 10 mg by mouth daily as needed for allergies.    Marland Kitchen. meclizine (ANTIVERT) 25 MG tablet Take 25 mg by mouth as needed for dizziness.    . Multiple Vitamins-Minerals (CENTRUM SILVER) tablet Take 1 tablet by mouth daily.       No current facility-administered medications for this visit.     Past Medical History:  Diagnosis Date  . Breast nodule 11/16/2013  . H/O hematuria   . HLD (hyperlipidemia)   . Hypertension   . 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 Migraine headache   . Palpitations   . Restless leg syndrome   . Vaginal atrophy     Past Surgical History:  Procedure Laterality Date  . APPENDECTOMY    . CARDIAC CATHETERIZATION  2005  cone  . COLONOSCOPY    . HERNIA REPAIR    . LEFT HEART CATHETERIZATION WITH CORONARY ANGIOGRAM N/A 04/12/2012   Procedure: LEFT HEART CATHETERIZATION WITH CORONARY ANGIOGRAM;  Surgeon: Kathleene Hazel, MD;  Location: [facility] CATH LAB;  Service: Cardiovascular;  Laterality: N/A;  . TOTAL ABDOMINAL HYSTERECTOMY      Social History   Socioeconomic History  . Marital status: Single    Spouse name: Not on file  . Number of children: 0  . Years of education: College  . Highest education level: Not on file  Occupational History  . Occupation: retired  Sports coach  . Financial resource strain: Not on file  . Food insecurity:    Worry: Not on file    Inability: Not on file  . Transportation needs:    Medical: Not on file    Non-medical: Not on file  Tobacco Use  . Smoking status: Never Smoker  . Smokeless tobacco: Never Used  Substance and Sexual Activity  . Alcohol use: Yes    Comment: rarely; glass of wine with dinner  . Drug use: No  . Sexual activity: Never    Birth control/protection: Surgical    Comment: hyst  Lifestyle  . Physical activity:    Days per week: Not on file    Minutes per session: Not on file  . Stress: Not on file  Relationships  . Social connections:    Talks on phone: Not on file    Gets together: Not on file    Attends religious service: Not on file    Active member of club or organization: Not on file    Attends meetings of clubs or organizations: Not on file    Relationship status: Not on file  . Intimate partner violence:    Fear of current or ex partner: Not on file    Emotionally abused: Not on file    Physically abused: Not on file    Forced sexual activity: Not on file  Other Topics Concern  . Not on file  Social History Narrative   Single. Retired.    Patient lives at home alone.   Caffeine Use: none     Vitals:   04/19/18 0951  BP: 132/84  Pulse: 96  Weight: 197 lb 6.4 oz (89.5 kg)  Height: 5\' 7"  (1.702 m)    Wt Readings from Last 3 Encounters:  04/19/18 197 lb 6.4 oz (89.5 kg)  03/07/18 193 lb (87.5 kg)  03/05/18 193 lb (87.5 kg)     PHYSICAL EXAM General: NAD HEENT: Normal. Neck: No JVD, no thyromegaly. Lungs: Clear to auscultation bilaterally with normal respiratory effort. CV: Regular rate and rhythm, normal S1/S2, no S3/S4, no murmur. No pretibial or periankle edema.    Abdomen: Soft, nontender, no distention.  Neurologic: Alert and oriented.  Psych: Normal affect. Skin: Normal. Musculoskeletal: No gross deformities.    ECG: Reviewed above under  Subjective   Labs: Lab Results  Component Value Date/Time   K 4.1 01/19/2018 02:01 AM   BUN 21 (H) 01/19/2018 02:01 AM   BUN 16 10/21/2013 01:10 PM   CREATININE 1.69 (H) 01/19/2018 02:01 AM   ALT 18 01/19/2018 02:01 AM   TSH 3.031 08/31/2017 11:59 AM   TSH 3.010 10/21/2013 01:10 PM   HGB 13.1 01/19/2018 02:01 AM     Lipids: No results found for: LDLCALC, LDLDIRECT, CHOL, TRIG, HDL     ASSESSMENT AND PLAN:  1. Paroxysmal atrial fibrillation: Event monitor results  reviewed above with no evidence of breakthrough atrial fibrillation. I am uncertain if she is experiencing side effects from amiodarone.  To be prudent, I will discontinue it.  I will start Toprol-XL 25 mg daily.  Continue Eliquis for anticoagulation. TSH and liver transaminases are reviewed above.  TSH is mildly elevated.  This is being monitored by her PCP.  2. Chest pain:  Symptomatically stable.  She has had several recurrences which clinically appeared atypical for cardiac etiology.  Nuclear stress test was low risk as detailed above.   Symptoms were previously not controlled with the addition of Zantac to Protonix.  She also has a hiatal hernia.  3. Valvular heart disease: Stable.  No significant murmurs on exam today. I will monitor clinically and with surveillance echocardiography.  4. Essential hypertension: Blood pressure is normal.  I will monitor given the addition of Toprol-XL.   Disposition: Follow up 4 months   Prentice Docker, M.D., F.A.C.C.

## 2018-04-21 ENCOUNTER — Telehealth: Payer: Self-pay | Admitting: Cardiovascular Disease

## 2018-04-21 NOTE — Telephone Encounter (Signed)
Patient called stating that her BP was 87/68 HR 102.

## 2018-04-21 NOTE — Telephone Encounter (Signed)
Yesterday was first day she took her Toprol XL 25mg  daily.  Felt dizzy & nauseous - took around 6 am - symptoms started around noon.    BP readings this morning -   8:15 am - 87/68  102 9:30 am - 89/72  95  Felt better this morning than she did last evening.

## 2018-04-21 NOTE — Telephone Encounter (Signed)
-   D/C amlodipine

## 2018-04-21 NOTE — Telephone Encounter (Signed)
Patient notified and verbalized understanding. 

## 2018-04-24 ENCOUNTER — Encounter (HOSPITAL_COMMUNITY): Payer: Self-pay | Admitting: Emergency Medicine

## 2018-04-24 ENCOUNTER — Other Ambulatory Visit: Payer: Self-pay

## 2018-04-24 ENCOUNTER — Emergency Department (HOSPITAL_COMMUNITY)
Admission: EM | Admit: 2018-04-24 | Discharge: 2018-04-24 | Disposition: A | Discharge status: Home / Self Care | Payer: Medicare Other | Attending: Emergency Medicine | Admitting: Emergency Medicine

## 2018-04-24 DIAGNOSIS — I480 Paroxysmal atrial fibrillation: Secondary | ICD-10-CM | POA: Diagnosis not present

## 2018-04-24 DIAGNOSIS — Z7901 Long term (current) use of anticoagulants: Secondary | ICD-10-CM | POA: Insufficient documentation

## 2018-04-24 DIAGNOSIS — R110 Nausea: Secondary | ICD-10-CM | POA: Diagnosis present

## 2018-04-24 DIAGNOSIS — I959 Hypotension, unspecified: Secondary | ICD-10-CM | POA: Insufficient documentation

## 2018-04-24 DIAGNOSIS — D7589 Other specified diseases of blood and blood-forming organs: Secondary | ICD-10-CM | POA: Diagnosis not present

## 2018-04-24 DIAGNOSIS — I10 Essential (primary) hypertension: Secondary | ICD-10-CM | POA: Insufficient documentation

## 2018-04-24 DIAGNOSIS — N289 Disorder of kidney and ureter, unspecified: Secondary | ICD-10-CM | POA: Diagnosis not present

## 2018-04-24 DIAGNOSIS — I2510 Atherosclerotic heart disease of native coronary artery without angina pectoris: Secondary | ICD-10-CM | POA: Insufficient documentation

## 2018-04-24 DIAGNOSIS — Z79899 Other long term (current) drug therapy: Secondary | ICD-10-CM | POA: Diagnosis not present

## 2018-04-24 LAB — CBC WITH DIFFERENTIAL/PLATELET
BASOS ABS: 0 10*3/uL (ref 0.0–0.1)
Basophils Relative: 0 %
EOS ABS: 0.1 10*3/uL (ref 0.0–0.7)
Eosinophils Relative: 1 %
HCT: 43.5 % (ref 36.0–46.0)
HEMOGLOBIN: 14.4 g/dL (ref 12.0–15.0)
LYMPHS ABS: 1.7 10*3/uL (ref 0.7–4.0)
Lymphocytes Relative: 27 %
MCH: 33.9 pg (ref 26.0–34.0)
MCHC: 33.1 g/dL (ref 30.0–36.0)
MCV: 102.4 fL — ABNORMAL HIGH (ref 78.0–100.0)
Monocytes Absolute: 0.6 10*3/uL (ref 0.1–1.0)
Monocytes Relative: 9 %
Neutro Abs: 4 10*3/uL (ref 1.7–7.7)
Neutrophils Relative %: 63 %
Platelets: 229 10*3/uL (ref 150–400)
RBC: 4.25 MIL/uL (ref 3.87–5.11)
RDW: 14.1 % (ref 11.5–15.5)
WBC: 6.3 10*3/uL (ref 4.0–10.5)

## 2018-04-24 LAB — COMPREHENSIVE METABOLIC PANEL
ALT: 21 U/L (ref 0–44)
ANION GAP: 8 (ref 5–15)
AST: 25 U/L (ref 15–41)
Albumin: 3.9 g/dL (ref 3.5–5.0)
Alkaline Phosphatase: 63 U/L (ref 38–126)
BUN: 19 mg/dL (ref 8–23)
CHLORIDE: 107 mmol/L (ref 98–111)
CO2: 27 mmol/L (ref 22–32)
Calcium: 9.3 mg/dL (ref 8.9–10.3)
Creatinine, Ser: 1.46 mg/dL — ABNORMAL HIGH (ref 0.44–1.00)
GFR calc Af Amer: 39 mL/min — ABNORMAL LOW (ref >60)
GFR, EST NON AFRICAN AMERICAN: 33 mL/min — AB (ref >60)
Glucose, Bld: 98 mg/dL (ref 70–99)
POTASSIUM: 4.3 mmol/L (ref 3.5–5.1)
Sodium: 142 mmol/L (ref 135–145)
Total Bilirubin: 0.8 mg/dL (ref 0.3–1.2)
Total Protein: 6.8 g/dL (ref 6.5–8.1)

## 2018-04-24 LAB — I-STAT TROPONIN, ED: TROPONIN I, POC: 0 ng/mL (ref 0.00–0.08)

## 2018-04-24 MED ORDER — ONDANSETRON HCL 4 MG PO TABS: 4.0000 mg | ORAL_TABLET | Freq: Four times a day (QID) | ORAL | 0 refills | Status: DC | PRN

## 2018-04-24 MED ORDER — ONDANSETRON HCL 4 MG/2ML IJ SOLN
4.0000 mg | Freq: Once | INTRAMUSCULAR | Status: AC
  Administered 2018-04-24: 4 mg via INTRAVENOUS
  Filled 2018-04-24: qty 2

## 2018-04-24 NOTE — ED Triage Notes (Signed)
Pt c/o low Bps at home since stopping amiodarone Monday and added metoprolol 25mg , Thurs pt was instructed to stop taking amlodipine, pt reports since her BP has been consistently low, this morning BP at home was 99/81, BP during triage 145/101

## 2018-04-24 NOTE — Discharge Instructions (Addendum)
Continue to check your blood pressure once or twice a day. Keep a record of it, and take that record with you when you see your doctor.   Return if symptoms are getting worse.

## 2018-04-24 NOTE — ED Provider Notes (Signed)
[facility] EMERGENCY DEPARTMENT Provider Note   CSN: [id number] Arrival date & time: 04/24/18  7829     History   Chief Complaint No chief complaint on file.   HPI Hailey [name] is a 77 y.o. female.  The history is provided by the patient.  She has history of hypertension, paroxysmal atrial fibrillation and comes in with low blood pressure and dizziness.  She had seen her cardiologist on September 16 and was told to discontinue amiodarone.  She was started on metoprolol at that time.  Amiodarone was discontinued because of nausea, dizziness, near syncope.  Following that, blood pressure had been low and she was told to discontinue amlodipine.  Since then, she continues to have nausea and dizziness which is worse when she stands up.  She has had some near syncopal episodes but has not actually passed out.  She has not vomited.  She denies chest pain.  Symptoms have been stable.  Tonight, she woke up and she continued feeling dizzy and nauseous and checked her blood pressure and it was 99 systolic.  She showed me her blood pressures that she has been taking at home and they have been running from the mid 80s to the upper 90s systolic.  Past Medical History:  Diagnosis Date  . Breast nodule 11/16/2013  . H/O hematuria   . HLD (hyperlipidemia)   . Hypertension   . 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 Migraine headache   . Palpitations   . Restless leg syndrome   . Vaginal atrophy     Patient Active Problem List   Diagnosis Date Noted  . Vaginal dryness 01/28/2017  . Vaginal atrophy 01/28/2017  . Breast nodule 11/16/2013  . Breast mass, right 12/22/2012  . OVERWEIGHT 04/13/2009  . GENERALIZED ANXIETY DISORDER 04/13/2009  . CORONARY ARTERY DISEASE 04/13/2009  . PALPITATIONS 04/03/2009    Past Surgical History:  Procedure Laterality Date  . APPENDECTOMY    . CARDIAC CATHETERIZATION  2005    cone  . COLONOSCOPY    . HERNIA REPAIR    . LEFT HEART CATHETERIZATION  WITH CORONARY ANGIOGRAM N/A 04/12/2012   Procedure: LEFT HEART CATHETERIZATION WITH CORONARY ANGIOGRAM;  Surgeon: Kathleene Hazel, MD;  Location: [facility] CATH LAB;  Service: Cardiovascular;  Laterality: N/A;  . TOTAL ABDOMINAL HYSTERECTOMY       OB History    Gravida  1   Para      Term      Preterm      AB  1   Living  0     SAB  1   TAB      Ectopic      Multiple      Live Births               Home Medications    Prior to Admission medications   Medication Sig Start Date End Date Taking? Authorizing Provider  acetaminophen (TYLENOL) 650 MG CR tablet Take 650-1,300 mg by mouth See admin instructions. Take 2 tablets every morning and take 1 tablet at night    [provider]  apixaban (ELIQUIS) 5 MG TABS tablet Take 5 mg by mouth 2 (two) times daily.    [provider]  Calcium Carbonate-Vit D-Min 600-400 MG-UNIT TABS Take 1 tablet by mouth daily.     [provider]  clonazepam (KLONOPIN) 0.125 MG disintegrating tablet Take 0.125 mg by mouth at bedtime. 01/12/15   [provider]  fluticasone (FLONASE) 50 MCG/ACT nasal spray Place 2 sprays into both nostrils daily as needed for allergies or rhinitis.    [provider]  loratadine (CLARITIN) 10 MG tablet Take 10 mg by mouth daily as needed for allergies.    [provider]  meclizine (ANTIVERT) 25 MG tablet Take 25 mg by mouth as needed for dizziness.    [provider]  metoprolol succinate (TOPROL XL) 25 MG 24 hr tablet Take 1 tablet (25 mg total) by mouth daily. 04/19/18   Laqueta LindenKoneswaran, Suresh A, MD  Multiple Vitamins-Minerals (CENTRUM SILVER) tablet Take 1 tablet by mouth daily.      [provider]    Family History Family History  Problem Relation Age of Onset  . Coronary artery disease Father        mid 7450s  . Hypertension Mother   . Stroke Brother   . Transient ischemic attack Brother   . Other Brother        on oxygen  .  Hypertension Brother   . Stroke Brother   . Heart disease Brother   . Heart attack Brother   . Other Sister        a fib, restless leg  . Other Sister        pressure in eyes    Social History Social History   Tobacco Use  . Smoking status: Never Smoker  . Smokeless tobacco: Never Used  Substance Use Topics  . Alcohol use: Yes    Comment: rarely; glass of wine with dinner  . Drug use: No     Allergies   Flomax [tamsulosin hcl]   Review of Systems Review of Systems  All other systems reviewed and are negative.    Physical Exam Updated Vital Signs BP 132/85   Pulse 90   Temp (!) 97.5 F (36.4 C) (Oral)   Resp 14   Ht 5\' 7"  (1.702 m)   Wt 86.2 kg   SpO2 100%   BMI 29.76 kg/m   Physical Exam  Nursing note and vitals reviewed.  77 year old female, resting comfortably and in no acute distress. Vital signs are normal. Oxygen saturation is 100%, which is normal. Head is normocephalic and atraumatic. PERRLA, EOMI. Oropharynx is clear. Neck is nontender and supple without adenopathy or JVD. Back is nontender and there is no CVA tenderness. Lungs are clear without rales, wheezes, or rhonchi. Chest is nontender. Heart has regular rate and rhythm without murmur. Abdomen is soft, flat, nontender without masses or hepatosplenomegaly and peristalsis is normoactive. Extremities have trace edema, full range of motion is present. Skin is warm and dry without rash. Neurologic: Mental status is normal, cranial nerves are intact, there are no motor or sensory deficits.  ED Treatments / Results  Labs (all labs ordered are listed, but only abnormal results are displayed) Labs Reviewed  COMPREHENSIVE METABOLIC PANEL - Abnormal; Notable for the following components:      Result Value   Creatinine, Ser 1.46 (*)    GFR calc non Af Amer 33 (*)    GFR calc Af Amer 39 (*)    All other components within normal limits  CBC WITH DIFFERENTIAL/PLATELET - Abnormal; Notable for the  following components:   MCV 102.4 (*)    All other components within normal limits  I-STAT TROPONIN, ED    EKG EKG Interpretation  Date/Time:  Saturday April 24 2018 05:46:33 EDT Ventricular Rate:  94 PR Interval:    QRS Duration: 133  QT Interval:  416 QTC Calculation: 521 R Axis:   122 Text Interpretation:  Sinus rhythm Prolonged PR interval Consider dextrocardia Baseline wander in lead(s) I Low voltage QRS When compared with ECG of 01/19/2018, No significant change was found Confirmed by Dione Booze [phone]) on 04/24/2018 6:07:35 AM  Procedures Procedures   Medications Ordered in ED Medications  ondansetron [facility]) injection 4 mg (4 mg Intravenous Given 04/24/18 0612)     Initial Impression / Assessment and Plan / ED Course  I have reviewed the triage vital signs and the nursing notes.  Pertinent lab results that were available during my care of the patient were reviewed by me and considered in my medical decision making (see chart for details).  Hypotension and orthostatic dizziness with associated nausea.  I wonder whether she might be having either renal failure or hepatic failure, so we will check comprehensive metabolic panel.  Old records are reviewed confirming recent medication changes.  Last labs on record were from June at which time she did have elevated creatinine of 1.69, which was increased over baseline.  ECG today is unchanged.  We will also check orthostatic vital signs.  Orthostatic vital signs showed no significant change in pulse or blood pressure.  Laboratory work-up does show mild renal insufficiency which is improved over last recorded value.  Also, macrocytosis is present which is also not significantly changed.  Patient is felt to be safe for discharge.  This does not seem to be a medication toxicity issue.  She does feel better after a dose of ondansetron so she is discharged with prescription for same.  Follow-up with her cardiologist.  Consider GI  consultation if nausea persists.  Final Clinical Impressions(s) / ED Diagnoses   Final diagnoses:  Hypotension, unspecified hypotension type  Nausea  Renal insufficiency  Paroxysmal atrial fibrillation (HCC)  Chronic anticoagulation  Macrocytosis without anemia    ED Discharge Orders         Ordered    ondansetron (ZOFRAN) 4 MG tablet  Every 6 hours PRN     04/24/18 0711           Dione Booze, MD 04/24/18 [phone]

## 2018-04-26 ENCOUNTER — Telehealth: Payer: Self-pay | Admitting: *Deleted

## 2018-04-26 NOTE — Telephone Encounter (Signed)
Pt doesn't want to stop Toprol XL - says she will continue with Zofran and see if this will help symptoms - says she is scared to stop taking any medications at this time. Will forward to provider FYI

## 2018-04-26 NOTE — Telephone Encounter (Signed)
Discontinue Toprol-XL

## 2018-04-26 NOTE — Telephone Encounter (Signed)
Pt called back and said she would hold Toprol for a few days and see if this improves her symptoms

## 2018-04-26 NOTE — Telephone Encounter (Signed)
Pt c/o nausea and dizziness - went to ED Saturday and was given Zofran - says this hasn't helped symptoms - also says she has to stay in bed afraid of falling - thinks BP is causing dizziness - BP has been 102/74, 102/63, and yesterday morning was 79/67 HR has been between 99-107

## 2018-04-28 ENCOUNTER — Telehealth: Payer: Self-pay | Admitting: Cardiovascular Disease

## 2018-04-28 DIAGNOSIS — I480 Paroxysmal atrial fibrillation: Secondary | ICD-10-CM

## 2018-04-28 DIAGNOSIS — R002 Palpitations: Secondary | ICD-10-CM

## 2018-04-28 NOTE — Telephone Encounter (Signed)
Patient notified and verbalized understanding.  Patient is willing to try the AFib Clinic.  [facility] put [facility] into Epic & [facility]

## 2018-04-28 NOTE — Telephone Encounter (Signed)
Stated that she has continued to hold the Toprol XL.  Stated that this is about the 5th time she has had these episodes in  the last week.  Stated this all started happening when she was taken off of the Amiodarone.  No c/o chest pain.  Does notice that she has a slight headache.  Stated that she does feel a little nausea now, but has taken the Zofran & not gotten up out of bed much this morning.  123/90  94 at 3:30 am  At 9:20 this morning - 120/80  96.  Patient wants to know what else can be done.

## 2018-04-28 NOTE — Telephone Encounter (Signed)
Patient called stating that she woke up at 2am with c/o nausea and dizziness . She is wanting to know if her medications need to be changed.  Please call cell # [phone].

## 2018-04-28 NOTE — Telephone Encounter (Signed)
She reportedly developed side effects (rash) with amiodarone and this is why it was discontinued.  She developed hypotension with metoprolol succinate so this was also discontinued.  Blood pressure has improved.  I would like to have her see EP to see if they have any additional recommendations.  Perhaps, she could be evaluated in the atrial fibrillation clinic.  Please look into their availability with either an EP physician or in the A. fib clinic.

## 2018-04-29 ENCOUNTER — Ambulatory Visit (HOSPITAL_COMMUNITY)
Admission: RE | Admit: 2018-04-29 | Discharge: 2018-04-29 | Disposition: A | Discharge status: Home / Self Care | Payer: Medicare Other | Source: Ambulatory Visit | Attending: Nurse Practitioner | Admitting: Nurse Practitioner

## 2018-04-29 ENCOUNTER — Encounter (HOSPITAL_COMMUNITY): Payer: Self-pay | Admitting: Nurse Practitioner

## 2018-04-29 VITALS — BP 118/74 | HR 104 | Ht 67.0 in | Wt 193.0 lb

## 2018-04-29 DIAGNOSIS — E785 Hyperlipidemia, unspecified: Secondary | ICD-10-CM | POA: Diagnosis not present

## 2018-04-29 DIAGNOSIS — R42 Dizziness and giddiness: Secondary | ICD-10-CM | POA: Diagnosis not present

## 2018-04-29 DIAGNOSIS — R110 Nausea: Secondary | ICD-10-CM | POA: Diagnosis not present

## 2018-04-29 DIAGNOSIS — I10 Essential (primary) hypertension: Secondary | ICD-10-CM | POA: Insufficient documentation

## 2018-04-29 DIAGNOSIS — I480 Paroxysmal atrial fibrillation: Secondary | ICD-10-CM | POA: Diagnosis not present

## 2018-04-29 DIAGNOSIS — Z8249 Family history of ischemic heart disease and other diseases of the circulatory system: Secondary | ICD-10-CM | POA: Diagnosis not present

## 2018-04-29 DIAGNOSIS — R000 Tachycardia, unspecified: Secondary | ICD-10-CM | POA: Diagnosis not present

## 2018-04-29 DIAGNOSIS — Z7901 Long term (current) use of anticoagulants: Secondary | ICD-10-CM | POA: Diagnosis not present

## 2018-04-29 DIAGNOSIS — Z79899 Other long term (current) drug therapy: Secondary | ICD-10-CM | POA: Insufficient documentation

## 2018-04-29 DIAGNOSIS — I4510 Unspecified right bundle-branch block: Secondary | ICD-10-CM | POA: Diagnosis not present

## 2018-04-29 DIAGNOSIS — I4891 Unspecified atrial fibrillation: Secondary | ICD-10-CM | POA: Diagnosis not present

## 2018-04-29 NOTE — Progress Notes (Signed)
Primary Care Physician: Richardean Chimera, MD Referring Physician: Dr. Ward Chatters Hailey [name] is a 77 y.o. female with a h/o afib that is in the afib clinic for evaluation. She was dx with afib one year ago  and was placed on amiodarone. She did well until 2 weeks ago when she started having random dizzy/nausea episodes. It was felt that amiodarone may be the culprit and it was stopped. She was then placed on Toprol and continued to have worse spells associated with hypotension. For one of these episodes she presented to the ER and found to be hypotensive. BB/and amlodipine were stopped. Sine then she continues to have some of these episodes. Since stopping the BB/CCB, she has not had any further issues with low blood pressure. She has checked her heart rate with episodes and appears to be in normal range. She is inh afib today but feels ok. She has felt syncopal but no tru syncope.Labs in ER within normal limits.   Today, she denies symptoms of palpitations, chest pain, shortness of breath, orthopnea, PND, lower extremity edema, dizziness, presyncope, syncope, or neurologic sequela. The patient is tolerating medications without difficulties and is otherwise without complaint today.   Past Medical History:  Diagnosis Date  . Breast nodule 11/16/2013  . H/O hematuria   . HLD (hyperlipidemia)   . Hypertension   . 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 Migraine headache   . Palpitations   . Restless leg syndrome   . Vaginal atrophy    Past Surgical History:  Procedure Laterality Date  . APPENDECTOMY    . CARDIAC CATHETERIZATION  2005    cone  . COLONOSCOPY    . HERNIA REPAIR    . LEFT HEART CATHETERIZATION WITH CORONARY ANGIOGRAM N/A 04/12/2012   Procedure: LEFT HEART CATHETERIZATION WITH CORONARY ANGIOGRAM;  Surgeon: Kathleene Hazel, MD;  Location: [facility] CATH LAB;  Service: Cardiovascular;  Laterality: N/A;  . TOTAL ABDOMINAL HYSTERECTOMY      Current Outpatient  Medications  Medication Sig Dispense Refill  . acetaminophen (TYLENOL) 650 MG CR tablet Take 650-1,300 mg by mouth See admin instructions. Take 2 tablets every morning and take 1 tablet at night    . apixaban (ELIQUIS) 5 MG TABS tablet Take 5 mg by mouth 2 (two) times daily.    . Calcium Carbonate-Vit D-Min 600-400 MG-UNIT TABS Take 1 tablet by mouth daily.     . clonazepam (KLONOPIN) 0.125 MG disintegrating tablet Take 0.125 mg by mouth at bedtime.  5  . fluticasone (FLONASE) 50 MCG/ACT nasal spray Place 2 sprays into both nostrils daily as needed for allergies or rhinitis.    Marland Kitchen loratadine (CLARITIN) 10 MG tablet Take 10 mg by mouth daily as needed for allergies.    Marland Kitchen meclizine (ANTIVERT) 25 MG tablet Take 25 mg by mouth as needed for dizziness.    . Multiple Vitamins-Minerals (CENTRUM SILVER) tablet Take 1 tablet by mouth daily.      . ondansetron (ZOFRAN) 4 MG tablet Take 1 tablet (4 mg total) by mouth every 6 (six) hours as needed. (Patient not taking: Reported on 04/29/2018) 20 tablet 0   No current facility-administered medications for this encounter.     Allergies  Allergen Reactions  . Amiodarone Itching  . Flomax [Tamsulosin Hcl] Nausea And Vomiting    Social History   Socioeconomic History  . Marital status: Single    Spouse name: Not on file  . Number of children:  0  . Years of education: College  . Highest education level: Not on file  Occupational History  . Occupation: retired  Engineer, production  . Financial resource strain: Not on file  . Food insecurity:    Worry: Not on file    Inability: Not on file  . Transportation needs:    Medical: Not on file    Non-medical: Not on file  Tobacco Use  . Smoking status: Never Smoker  . Smokeless tobacco: Never Used  Substance and Sexual Activity  . Alcohol use: Yes    Comment: rarely; glass of wine with dinner  . Drug use: No  . Sexual activity: Never    Birth control/protection: Surgical    Comment: hyst  Lifestyle  .  Physical activity:    Days per week: Not on file    Minutes per session: Not on file  . Stress: Not on file  Relationships  . Social connections:    Talks on phone: Not on file    Gets together: Not on file    Attends religious service: Not on file    Active member of club or organization: Not on file    Attends meetings of clubs or organizations: Not on file    Relationship status: Not on file  . Intimate partner violence:    Fear of current or ex partner: Not on file    Emotionally abused: Not on file    Physically abused: Not on file    Forced sexual activity: Not on file  Other Topics Concern  . Not on file  Social History Narrative   Single. Retired.    Patient lives at home alone.   Caffeine Use: none    Family History  Problem Relation Age of Onset  . Coronary artery disease Father        mid 74s  . Hypertension Mother   . Stroke Brother   . Transient ischemic attack Brother   . Other Brother        on oxygen  . Hypertension Brother   . Stroke Brother   . Heart disease Brother   . Heart attack Brother   . Other Sister        a fib, restless leg  . Other Sister        pressure in eyes    ROS- All systems are reviewed and negative except as per the HPI above  Physical Exam: Vitals:   04/29/18 0853  BP: 118/74  Pulse: (!) 104  Weight: 87.5 kg  Height: 5\' 7"  (1.702 m)   Wt Readings from Last 3 Encounters:  04/29/18 87.5 kg  04/24/18 86.2 kg  04/19/18 89.5 kg    Labs: Lab Results  Component Value Date   NA 142 04/24/2018   K 4.3 04/24/2018   CL 107 04/24/2018   CO2 27 04/24/2018   GLUCOSE 98 04/24/2018   BUN 19 04/24/2018   CREATININE 1.46 (H) 04/24/2018   CALCIUM 9.3 04/24/2018   Lab Results  Component Value Date   INR 0.99 04/12/2012   No results found for: CHOL, HDL, LDLCALC, TRIG   GEN- The patient is well appearing, alert and oriented x 3 today.   Head- normocephalic, atraumatic Eyes-  Sclera clear, conjunctiva pink Ears-  hearing intact Oropharynx- clear Neck- supple, no JVP Lymph- no cervical lymphadenopathy Lungs- Clear to ausculation bilaterally, normal work of breathing Heart- irregular rate and rhythm, no murmurs, rubs or gallops, PMI not laterally displaced GI- soft, NT, ND, +  BS Extremities- no clubbing, cyanosis, or edema MS- no significant deformity or atrophy Skin- no rash or lesion Psych- euthymic mood, full affect Neuro- strength and sensation are intact  EKG- afib at 104 bpm, pr int 186 ms, qrs int 112 ms, qt c 486 ms Epic records reviewed Echo-05/2017-Study Conclusions  - Left ventricle: The cavity size was normal. Wall thickness was   normal. Systolic function was normal. The estimated ejection   fraction was in the range of 60% to 65%. Wall motion was normal;   there were no regional wall motion abnormalities. Left   ventricular diastolic function parameters were normal. - Aortic valve: Mildly to moderately calcified annulus. Trileaflet.   There was mild regurgitation. - Mitral valve: Calcified annulus. Normal thickness leaflets .   There was moderate regurgitation. - Tricuspid valve: There was mild regurgitation. - Pulmonary arteries: PA peak pressure: 34 mm Hg (S).   Assessment and Plan: 1. afib Off amiodarone, unfortunately she will have to have wash out before any other antiarrythmic can be started, may take 2-3 months to wash out  Will get amio level today  Rate is controlled as per  review of her home readings Hold off Toprol and amlodipine for recent hypotension  2 week zio patch applied  2. Dizzy spells/nausea Unclear to etiology of these spells Still occurring off above drugs ? vertigo If from [facility] then they should improve as more drug gets out of system If not needs to see PCP to have further worked up  I will see back in 3 weeks  Lupita Leash C. [facility] [address] [place], [place] [address] [phone]

## 2018-04-30 LAB — AMIODARONE LEVEL
AMIODARONE LVL: 1.4 ug/mL (ref 1.0–2.5)
N-DESETHYL-AMIODARONE: 1 ug/mL (ref 1.0–2.5)

## 2018-05-10 ENCOUNTER — Telehealth (HOSPITAL_COMMUNITY): Payer: Self-pay | Admitting: *Deleted

## 2018-05-10 NOTE — Telephone Encounter (Signed)
Patient called in stating she is having a chronic headache and nausea. Her HR is running right at 105-110 BP 104/84. She denies dizziness but the headache has been ongoing. Pt wondering if amiodarone is to blame. Pt did not have headaches while on drug therefore donna carroll np does not feel amiodarone is to blame. She would prefer pt be worked up by PCP for headache/nausea. Pt states she will be in touch once she sees her PCP.

## 2018-05-13 NOTE — Telephone Encounter (Signed)
Pt called with update she is feeling much better - went to her PCP with full work up which was normal. Her pharmacist informed her that zofan could cause dizziness and nausea due to interaction with Eliquis. Once pt stopped zofran her symptoms subsided.

## 2018-05-20 ENCOUNTER — Encounter (HOSPITAL_COMMUNITY): Payer: Self-pay | Admitting: Nurse Practitioner

## 2018-05-20 ENCOUNTER — Ambulatory Visit (HOSPITAL_COMMUNITY)
Admission: RE | Admit: 2018-05-20 | Discharge: 2018-05-20 | Disposition: A | Discharge status: Home / Self Care | Payer: Medicare Other | Source: Ambulatory Visit | Attending: Nurse Practitioner | Admitting: Nurse Practitioner

## 2018-05-20 VITALS — BP 130/84 | HR 126 | Ht 67.0 in | Wt 194.0 lb

## 2018-05-20 DIAGNOSIS — Z7951 Long term (current) use of inhaled steroids: Secondary | ICD-10-CM | POA: Insufficient documentation

## 2018-05-20 DIAGNOSIS — Z7901 Long term (current) use of anticoagulants: Secondary | ICD-10-CM | POA: Insufficient documentation

## 2018-05-20 DIAGNOSIS — Z79899 Other long term (current) drug therapy: Secondary | ICD-10-CM | POA: Insufficient documentation

## 2018-05-20 DIAGNOSIS — Z8249 Family history of ischemic heart disease and other diseases of the circulatory system: Secondary | ICD-10-CM | POA: Insufficient documentation

## 2018-05-20 DIAGNOSIS — I4891 Unspecified atrial fibrillation: Secondary | ICD-10-CM | POA: Insufficient documentation

## 2018-05-20 DIAGNOSIS — I480 Paroxysmal atrial fibrillation: Secondary | ICD-10-CM | POA: Diagnosis not present

## 2018-05-20 DIAGNOSIS — H8109 Meniere's disease, unspecified ear: Secondary | ICD-10-CM | POA: Diagnosis not present

## 2018-05-20 DIAGNOSIS — E785 Hyperlipidemia, unspecified: Secondary | ICD-10-CM | POA: Insufficient documentation

## 2018-05-20 DIAGNOSIS — I4892 Unspecified atrial flutter: Secondary | ICD-10-CM | POA: Diagnosis not present

## 2018-05-20 DIAGNOSIS — I4811 Longstanding persistent atrial fibrillation: Secondary | ICD-10-CM | POA: Diagnosis not present

## 2018-05-20 DIAGNOSIS — I10 Essential (primary) hypertension: Secondary | ICD-10-CM | POA: Diagnosis not present

## 2018-05-20 DIAGNOSIS — G2581 Restless legs syndrome: Secondary | ICD-10-CM | POA: Insufficient documentation

## 2018-05-20 MED ORDER — METOPROLOL TARTRATE 25 MG PO TABS: 12.5000 mg | ORAL_TABLET | Freq: Two times a day (BID) | ORAL | 3 refills | Status: DC

## 2018-05-20 NOTE — Progress Notes (Signed)
Primary Care Physician: Richardean Chimera, MD Referring Physician: Dr. Ward Chatters Hailey [name] is a 77 y.o. female with a h/o afib that is in the afib clinic for evaluation. She was dx with afib one year ago  and was placed on amiodarone. She did well until 2 weeks ago when she started having random dizzy/nausea episodes. It was felt that amiodarone may be the culprit and it was stopped. She was then placed on Toprol and continued to have worse spells associated with hypotension. For one of these episodes she presented to the ER and found to be hypotensive. BB/and amlodipine were stopped. Sine then she continues to have some of these episodes. Since stopping the BB/CCB, she has not had any further issues with low blood pressure. She has checked her heart rate with episodes and appears to be in normal range. She is in  afib  on initial visit in the afib clinc  but felt ok. She has felt syncopal but no true syncope.Labs in ER within normal limits.  F/u in afib clinic 10/17. Monitor was placed that showed pt was in atrial flutter the whole time with v rates average at 100 bpm, range 41-140. Her last amiodarone level was too high at 1.4, 3 weeks ago, to consider starting another antiarrythmic. Will repeat today. She is still c/o of daily  H/A, intermittent nausea and fatigue. Her PCP worked up H/A recently without any abnormal findings. Discussed trying low dose BB for rate control to see if I can make her feel better until amio wash out. She will have to watch her BP's carefully to look for untoward hypotension.   Today, she denies symptoms of palpitations, chest pain, shortness of breath, orthopnea, PND, lower extremity edema, dizziness, presyncope, syncope, or neurologic sequela. The patient is tolerating medications without difficulties and is otherwise without complaint today.   Past Medical History:  Diagnosis Date  . Breast nodule 11/16/2013  . H/O hematuria   . HLD (hyperlipidemia)   .  Hypertension   . 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 Migraine headache   . Palpitations   . Restless leg syndrome   . Vaginal atrophy    Past Surgical History:  Procedure Laterality Date  . APPENDECTOMY    . CARDIAC CATHETERIZATION  2005    cone  . COLONOSCOPY    . HERNIA REPAIR    . LEFT HEART CATHETERIZATION WITH CORONARY ANGIOGRAM N/A 04/12/2012   Procedure: LEFT HEART CATHETERIZATION WITH CORONARY ANGIOGRAM;  Surgeon: Kathleene Hazel, MD;  Location: [facility] CATH LAB;  Service: Cardiovascular;  Laterality: N/A;  . TOTAL ABDOMINAL HYSTERECTOMY      Current Outpatient Medications  Medication Sig Dispense Refill  . acetaminophen (TYLENOL) 650 MG CR tablet Take 650-1,300 mg by mouth See admin instructions. Take 2 tablets every morning and take 1 tablet at night    . apixaban (ELIQUIS) 5 MG TABS tablet Take 5 mg by mouth 2 (two) times daily.    . Calcium Carbonate-Vit D-Min 600-400 MG-UNIT TABS Take 1 tablet by mouth daily.     . clonazepam (KLONOPIN) 0.125 MG disintegrating tablet Take 0.125 mg by mouth at bedtime.  5  . fluticasone (FLONASE) 50 MCG/ACT nasal spray Place 2 sprays into both nostrils daily as needed for allergies or rhinitis.    Marland Kitchen loratadine (CLARITIN) 10 MG tablet Take 10 mg by mouth daily as needed for allergies.    . Multiple Vitamins-Minerals (CENTRUM SILVER) tablet Take  1 tablet by mouth daily.      . meclizine (ANTIVERT) 25 MG tablet Take 25 mg by mouth as needed for dizziness.    . metoprolol tartrate (LOPRESSOR) 25 MG tablet Take 0.5 tablets (12.5 mg total) by mouth 2 (two) times daily. 30 tablet 3  . ondansetron (ZOFRAN) 4 MG tablet Take 1 tablet (4 mg total) by mouth every 6 (six) hours as needed. (Patient not taking: Reported on 04/29/2018) 20 tablet 0   No current facility-administered medications for this encounter.     Allergies  Allergen Reactions  . Amiodarone Itching  . Flomax [Tamsulosin Hcl] Nausea And Vomiting    Social History    Socioeconomic History  . Marital status: Single    Spouse name: Not on file  . Number of children: 0  . Years of education: College  . Highest education level: Not on file  Occupational History  . Occupation: retired  Engineer, production  . Financial resource strain: Not on file  . Food insecurity:    Worry: Not on file    Inability: Not on file  . Transportation needs:    Medical: Not on file    Non-medical: Not on file  Tobacco Use  . Smoking status: Never Smoker  . Smokeless tobacco: Never Used  Substance and Sexual Activity  . Alcohol use: Yes    Comment: rarely; glass of wine with dinner  . Drug use: No  . Sexual activity: Never    Birth control/protection: Surgical    Comment: hyst  Lifestyle  . Physical activity:    Days per week: Not on file    Minutes per session: Not on file  . Stress: Not on file  Relationships  . Social connections:    Talks on phone: Not on file    Gets together: Not on file    Attends religious service: Not on file    Active member of club or organization: Not on file    Attends meetings of clubs or organizations: Not on file    Relationship status: Not on file  . Intimate partner violence:    Fear of current or ex partner: Not on file    Emotionally abused: Not on file    Physically abused: Not on file    Forced sexual activity: Not on file  Other Topics Concern  . Not on file  Social History Narrative   Single. Retired.    Patient lives at home alone.   Caffeine Use: none    Family History  Problem Relation Age of Onset  . Coronary artery disease Father        mid 33s  . Hypertension Mother   . Stroke Brother   . Transient ischemic attack Brother   . Other Brother        on oxygen  . Hypertension Brother   . Stroke Brother   . Heart disease Brother   . Heart attack Brother   . Other Sister        a fib, restless leg  . Other Sister        pressure in eyes    ROS- All systems are reviewed and negative except as per  the HPI above  Physical Exam: Vitals:   05/20/18 0908  BP: 130/84  Pulse: (!) 126  Weight: 88 kg  Height: 5\' 7"  (1.702 m)   Wt Readings from Last 3 Encounters:  05/20/18 88 kg  04/29/18 87.5 kg  04/24/18 86.2 kg  Labs: Lab Results  Component Value Date   NA 142 04/24/2018   K 4.3 04/24/2018   CL 107 04/24/2018   CO2 27 04/24/2018   GLUCOSE 98 04/24/2018   BUN 19 04/24/2018   CREATININE 1.46 (H) 04/24/2018   CALCIUM 9.3 04/24/2018   Lab Results  Component Value Date   INR 0.99 04/12/2012   No results found for: CHOL, HDL, LDLCALC, TRIG   GEN- The patient is well appearing, alert and oriented x 3 today.   Head- normocephalic, atraumatic Eyes-  Sclera clear, conjunctiva pink Ears- hearing intact Oropharynx- clear Neck- supple, no JVP Lymph- no cervical lymphadenopathy Lungs- Clear to ausculation bilaterally, normal work of breathing Heart- irregular rate and rhythm, no murmurs, rubs or gallops, PMI not laterally displaced GI- soft, NT, ND, + BS Extremities- no clubbing, cyanosis, or edema MS- no significant deformity or atrophy Skin- no rash or lesion Psych- euthymic mood, full affect Neuro- strength and sensation are intact  EKG- afib at 126 bpm, RBBB, qrs int 110 ms, qt c 521 ms Epic records reviewed Echo-05/2017-Study Conclusions  - Left ventricle: The cavity size was normal. Wall thickness was   normal. Systolic function was normal. The estimated ejection   fraction was in the range of 60% to 65%. Wall motion was normal;   there were no regional wall motion abnormalities. Left   ventricular diastolic function parameters were normal. - Aortic valve: Mildly to moderately calcified annulus. Trileaflet.   There was mild regurgitation. - Mitral valve: Calcified annulus. Normal thickness leaflets .   There was moderate regurgitation. - Tricuspid valve: There was mild regurgitation. - Pulmonary arteries: PA peak pressure: 34 mm Hg (S).   Assessment and  Plan: 1. afib/flutter Off amiodarone 3-4 weeks, unfortunately she will have to have wash out before any other antiarrythmic can be started, may take 2-3 months to wash out  Will repeat amio level today  Discussed today ablation vrs Tikosyn, at this time she is not ready to commit to ablation Will start metoprolol 25 mg 1/2 tab bid for better rate control  Pt is to watch BP's closely at home to watch for hypotension on BB  2. H/A/ intermittent nausea Unclear to etiology of these spells Still occurring off above drugs Cannot r/o that it is not associated with afib with rvr  I will see back in 3 weeks  Lupita Leash C. [facility] [address] [place], [place] [address] [phone]

## 2018-05-20 NOTE — Patient Instructions (Signed)
Start metoprolol 1/2 tablet twice a day with food 

## 2018-05-22 ENCOUNTER — Emergency Department (HOSPITAL_COMMUNITY): Payer: Medicare Other

## 2018-05-22 ENCOUNTER — Encounter (HOSPITAL_COMMUNITY): Payer: Self-pay | Admitting: Emergency Medicine

## 2018-05-22 ENCOUNTER — Emergency Department (HOSPITAL_COMMUNITY)
Admission: EM | Admit: 2018-05-22 | Discharge: 2018-05-22 | Disposition: A | Discharge status: Home / Self Care | Payer: Medicare Other | Attending: Emergency Medicine | Admitting: Emergency Medicine

## 2018-05-22 DIAGNOSIS — R110 Nausea: Secondary | ICD-10-CM | POA: Insufficient documentation

## 2018-05-22 DIAGNOSIS — Z79899 Other long term (current) drug therapy: Secondary | ICD-10-CM | POA: Insufficient documentation

## 2018-05-22 DIAGNOSIS — R42 Dizziness and giddiness: Secondary | ICD-10-CM

## 2018-05-22 DIAGNOSIS — I10 Essential (primary) hypertension: Secondary | ICD-10-CM | POA: Diagnosis not present

## 2018-05-22 DIAGNOSIS — I4892 Unspecified atrial flutter: Secondary | ICD-10-CM | POA: Diagnosis present

## 2018-05-22 LAB — BASIC METABOLIC PANEL
ANION GAP: 8 (ref 5–15)
BUN: 15 mg/dL (ref 8–23)
CALCIUM: 9.5 mg/dL (ref 8.9–10.3)
CO2: 25 mmol/L (ref 22–32)
Chloride: 107 mmol/L (ref 98–111)
Creatinine, Ser: 1.41 mg/dL — ABNORMAL HIGH (ref 0.44–1.00)
GFR, EST AFRICAN AMERICAN: 40 mL/min — AB (ref >60)
GFR, EST NON AFRICAN AMERICAN: 35 mL/min — AB (ref >60)
GLUCOSE: 109 mg/dL — AB (ref 70–99)
POTASSIUM: 4.4 mmol/L (ref 3.5–5.1)
SODIUM: 140 mmol/L (ref 135–145)

## 2018-05-22 LAB — CBC
HCT: 46.1 % — ABNORMAL HIGH (ref 36.0–46.0)
HEMOGLOBIN: 14.6 g/dL (ref 12.0–15.0)
MCH: 33 pg (ref 26.0–34.0)
MCHC: 31.7 g/dL (ref 30.0–36.0)
MCV: 104.1 fL — ABNORMAL HIGH (ref 80.0–100.0)
PLATELETS: 253 10*3/uL (ref 150–400)
RBC: 4.43 MIL/uL (ref 3.87–5.11)
RDW: 13.7 % (ref 11.5–15.5)
WBC: 5.8 10*3/uL (ref 4.0–10.5)
nRBC: 0 % (ref 0.0–0.2)

## 2018-05-22 LAB — I-STAT TROPONIN, ED: TROPONIN I, POC: 0 ng/mL (ref 0.00–0.08)

## 2018-05-22 LAB — T4, FREE: FREE T4: 1.05 ng/dL (ref 0.82–1.77)

## 2018-05-22 LAB — AMIODARONE LEVEL
AMIODARONE LVL: 1 ug/mL (ref 1.0–2.5)
N-DESETHYL-AMIODARONE: 1 ug/mL (ref 1.0–2.5)

## 2018-05-22 LAB — TSH: TSH: 5.486 u[IU]/mL — AB (ref 0.350–4.500)

## 2018-05-22 LAB — MAGNESIUM: Magnesium: 2.2 mg/dL (ref 1.7–2.4)

## 2018-05-22 MED ORDER — METOPROLOL TARTRATE 25 MG PO TABS
12.5000 mg | ORAL_TABLET | Freq: Once | ORAL | Status: AC
  Administered 2018-05-22: 12.5 mg via ORAL
  Filled 2018-05-22: qty 1

## 2018-05-22 NOTE — ED Triage Notes (Signed)
Pt here with concerns about her blood pressure, headache, and feeling near syncopal. Patient is concerned that amiodarone is still "in her blood and it's making me feel bad."

## 2018-05-22 NOTE — ED Notes (Signed)
PT states that she has had dizziness, SOB, and a headache for the past 6 weeks. Pt is seen at AFib clinic here, reports recent change in her medications.

## 2018-05-22 NOTE — Discharge Instructions (Addendum)
Call afib clinic on Monday as previously instructed. Call cardiologist, Dr. Purvis Sheffield, on Monday. Take medications as previously (metoprolol).  Return to the ED for any worsening including chest pain, shortness of breath, worsening dizziness, or fast heart rate.

## 2018-05-22 NOTE — ED Provider Notes (Signed)
[facility] EMERGENCY DEPARTMENT Provider Note   CSN: [id number] Arrival date & time: 05/22/18  1417     History   Chief Complaint Chief Complaint  Patient presents with  . Hypertension    HPI Hailey [name] is a 77 y.o. female.  HPI  Patient is a 77 year old female with PMHx of HTN, paroxysmal A. Fib on Eliquis, Mnire's disease, and migraines who presents with "elevated BP" at 127 systolic with associated HA, lightheadedness, and nausea around lunch time today.  Patient states she has been experiencing these symptoms intermittently since being on and subsequently taken off amiodarone in September.  Patient believes symptoms are related to uncontrolled BP and Afib/flutter.  She reports feeling much improved as symptoms resolved spontaneously.  She took her metoprolol at 0600 as previously prescribed.  She denies any associated CP, SOB, fevers, numbness, weakness, and abdominal pain.   Per chart review patient seen for the same on 04/24/2018.  Of note patient saw cardiologist on 05/20/18 for follow-up after she discontinued amiodarone, BB, and CCB secondary to hypotension, dizziness, and nausea.  Will not be starting another anti-arrhythmic until amiodarone completely washed out in 2-3 months.  Talks of ablation vs Tikosyn due to persistent afib/aflutter.  Started on metoprolol 12.5mg  bid.   Past Medical History:  Diagnosis Date  . Breast nodule 11/16/2013  . H/O hematuria   . HLD (hyperlipidemia)   . Hypertension   . 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 Migraine headache   . Palpitations   . Restless leg syndrome   . Vaginal atrophy     Patient Active Problem List   Diagnosis Date Noted  . Vaginal dryness 01/28/2017  . Vaginal atrophy 01/28/2017  . Breast nodule 11/16/2013  . Breast mass, right 12/22/2012  . OVERWEIGHT 04/13/2009  . GENERALIZED ANXIETY DISORDER 04/13/2009  . CORONARY ARTERY DISEASE 04/13/2009  . PALPITATIONS 04/03/2009     Past Surgical History:  Procedure Laterality Date  . APPENDECTOMY    . CARDIAC CATHETERIZATION  2005    cone  . COLONOSCOPY    . HERNIA REPAIR    . LEFT HEART CATHETERIZATION WITH CORONARY ANGIOGRAM N/A 04/12/2012   Procedure: LEFT HEART CATHETERIZATION WITH CORONARY ANGIOGRAM;  Surgeon: Kathleene Hazel, MD;  Location: [facility] CATH LAB;  Service: Cardiovascular;  Laterality: N/A;  . TOTAL ABDOMINAL HYSTERECTOMY       OB History    Gravida  1   Para      Term      Preterm      AB  1   Living  0     SAB  1   TAB      Ectopic      Multiple      Live Births               Home Medications    Prior to Admission medications   Medication Sig Start Date End Date Taking? Authorizing Provider  acetaminophen (TYLENOL) 650 MG CR tablet Take 650-1,300 mg by mouth See admin instructions. Take 2 tablets every morning and take 1 tablet at night    [provider]  apixaban (ELIQUIS) 5 MG TABS tablet Take 5 mg by mouth 2 (two) times daily.    [provider]  Calcium Carbonate-Vit D-Min 600-400 MG-UNIT TABS Take 1 tablet by mouth daily.     [provider]  clonazepam (KLONOPIN) 0.125 MG disintegrating tablet Take 0.125 mg by mouth at bedtime.  01/12/15   [provider]  fluticasone (FLONASE) 50 MCG/ACT nasal spray Place 2 sprays into both nostrils daily as needed for allergies or rhinitis.    [provider]  loratadine (CLARITIN) 10 MG tablet Take 10 mg by mouth daily as needed for allergies.    [provider]  meclizine (ANTIVERT) 25 MG tablet Take 25 mg by mouth as needed for dizziness.    [provider]  metoprolol tartrate (LOPRESSOR) 25 MG tablet Take 0.5 tablets (12.5 mg total) by mouth 2 (two) times daily. 05/20/18 08/18/18  Newman Nip, NP  Multiple Vitamins-Minerals (CENTRUM SILVER) tablet Take 1 tablet by mouth daily.      [provider]  ondansetron (ZOFRAN) 4 MG tablet Take 1 tablet  (4 mg total) by mouth every 6 (six) hours as needed. Patient not taking: Reported on 04/29/2018 04/24/18   Dione Booze, MD    Family History Family History  Problem Relation Age of Onset  . Coronary artery disease Father        mid 83s  . Hypertension Mother   . Stroke Brother   . Transient ischemic attack Brother   . Other Brother        on oxygen  . Hypertension Brother   . Stroke Brother   . Heart disease Brother   . Heart attack Brother   . Other Sister        a fib, restless leg  . Other Sister        pressure in eyes    Social History Social History   Tobacco Use  . Smoking status: Never Smoker  . Smokeless tobacco: Never Used  Substance Use Topics  . Alcohol use: Yes    Comment: rarely; glass of wine with dinner  . Drug use: No     Allergies   Amiodarone and Flomax [tamsulosin hcl]   Review of Systems Review of Systems  Constitutional: Positive for fatigue. Negative for chills and fever.  HENT: Negative for sore throat.   Eyes: Negative for pain and visual disturbance.  Respiratory: Negative for cough and shortness of breath.   Cardiovascular: Negative for chest pain and palpitations.  Gastrointestinal: Positive for nausea. Negative for abdominal pain, diarrhea and vomiting.  Genitourinary: Negative for dysuria and hematuria.  Musculoskeletal: Negative for arthralgias and back pain.  Skin: Negative for color change and rash.  Neurological: Positive for light-headedness and headaches. Negative for seizures and syncope.  All other systems reviewed and are negative.    Physical Exam Updated Vital Signs BP (!) 161/93   Pulse (!) 103   Temp 97.7 F (36.5 C) (Oral)   Resp 16   SpO2 100%   Physical Exam  Constitutional: She is oriented to person, place, and time. She appears well-developed and well-nourished. No distress.  HENT:  Head: Normocephalic and atraumatic.  Mouth/Throat: Oropharynx is clear and moist.  Eyes: Pupils are equal, round, and  reactive to light. Conjunctivae and EOM are normal.  Neck: Normal range of motion. Neck supple.  Cardiovascular: Intact distal pulses. An irregularly irregular rhythm present. Tachycardia present.  Pulmonary/Chest: Effort normal and breath sounds normal. No respiratory distress.  Abdominal: Soft. She exhibits no distension. There is no tenderness. There is no guarding.  Musculoskeletal: Normal range of motion. She exhibits no edema.  Neurological: She is alert and oriented to person, place, and time. GCS eye subscore is 4. GCS verbal subscore is 5. GCS motor subscore is 6.  Skin: Skin is warm and  dry. Capillary refill takes less than 2 seconds.  Psychiatric: She has a normal mood and affect.  Nursing note and vitals reviewed.    ED Treatments / Results  Labs (all labs ordered are listed, but only abnormal results are displayed) Labs Reviewed  BASIC METABOLIC PANEL - Abnormal; Notable for the following components:      Result Value   Glucose, Bld 109 (*)    Creatinine, Ser 1.41 (*)    GFR calc non Af Amer 35 (*)    GFR calc Af Amer 40 (*)    All other components within normal limits  CBC - Abnormal; Notable for the following components:   HCT 46.1 (*)    MCV 104.1 (*)    All other components within normal limits  TSH - Abnormal; Notable for the following components:   TSH 5.486 (*)    All other components within normal limits  MAGNESIUM  T4, FREE  I-STAT TROPONIN, ED    EKG None  Radiology Dg Chest 2 View  Result Date: 05/22/2018 CLINICAL DATA:  Near syncope. EXAM: CHEST - 2 VIEW COMPARISON:  Radiographs of January 19, 2018. FINDINGS: The heart size and mediastinal contours are within normal limits. Both lungs are clear. No pneumothorax or pleural effusion is noted. The visualized skeletal structures are unremarkable. IMPRESSION: No active cardiopulmonary disease. Electronically Signed   By: Lupita Raider, M.D.   On: 05/22/2018 17:27    Procedures Procedures (including  critical care time)  Medications Ordered in ED Medications  metoprolol tartrate (LOPRESSOR) tablet 12.5 mg (12.5 mg Oral Given 05/22/18 1801)     Initial Impression / Assessment and Plan / ED Course  I have reviewed the triage vital signs and the nursing notes.  Pertinent labs & imaging results that were available during my care of the patient were reviewed by me and considered in my medical decision making (see chart for details).     Patient is a 76 year old female with PMHx of HTN, paroxysmal A. Fib on Eliquis, Mnire's disease, and migraines who presents with "elevated BP" at 127 systolic with associated HA, lightheadedness, and nausea around lunch time today.  On arrival tachycardic otherwise normotensive.  Exam as above unremarkable.   EKG shows Aflutter at a rate of 123.  Labs obtained and grossly unremarkable.  No obvious electrolyte abnormalities.  Kidney function at baseline as Cr 1.41.  TSH elevated at 5.4 however free T4 wnl at 1.05.  CXR without acute cardiopulmonary process.  Etiology of symptoms likely 2/2 poorly controlled Afib/flutter.  Patient given home dose of metoprolol with improvement of HR to high 90s/low 100s.  She is normotensive and well appearing.  Doubt acute infectious, metabolic, or toxic etiology.  While patient is on Eliquis and did report a brief HA today, doubt acute intracranial process.  No falls or AMS.  No focal neurologic deficits or complaints of weakness or numbness.  No indication for CT scan at this time.  Had extensive conversation with patient regarding Afib/Flutter and importance of following up with Afib clinic and cardiologist on Monday as previously instructed as she will likely need further medication alterations and likely ablation procedure in the future.  Strict return precautions given.  Old records reviewed.  Imaging and labs reviewed and interpreted by myself and attending and used in the MDM.  Addressed patient question and concerns.   Reviewed discharged instructions with strict precautions given.  Advised patient to schedule follow-up with primary care provider.  Patient verbalized understanding and  agrees with plan.  Patient stable at discharge.  The plan for this patient was discussed with Dr. Rosalia Hammers who voiced agreement and who oversaw evaluation and treatment of this patient.  Final Clinical Impressions(s) / ED Diagnoses   Final diagnoses:  Atrial flutter, unspecified type [facility])  Nausea  Lightheaded    ED Discharge Orders    None       Abelardo Diesel, MD 05/22/18 Tye Savoy, MD 05/22/18 [phone]

## 2018-05-22 NOTE — ED Notes (Signed)
ED Provider at bedside. 

## 2018-05-24 ENCOUNTER — Telehealth (HOSPITAL_COMMUNITY): Payer: Self-pay | Admitting: *Deleted

## 2018-05-24 NOTE — Telephone Encounter (Signed)
Pt called to report she went to ER over weekend but is feeling much better today. HRs still in the 102-111 range with BP in the 93-104/75-80 range. Encouraged to keep well hydrated and taking metoprolol with food. Pt verbalized understanding she will call if further issues prior to appt in a few weeks.

## 2018-06-10 ENCOUNTER — Ambulatory Visit: Payer: Medicare Other | Admitting: Cardiovascular Disease

## 2018-06-10 ENCOUNTER — Ambulatory Visit (HOSPITAL_COMMUNITY)
Admission: RE | Admit: 2018-06-10 | Discharge: 2018-06-10 | Disposition: A | Discharge status: Home / Self Care | Payer: Medicare Other | Source: Ambulatory Visit | Attending: Nurse Practitioner | Admitting: Nurse Practitioner

## 2018-06-10 ENCOUNTER — Encounter (HOSPITAL_COMMUNITY): Payer: Self-pay | Admitting: Nurse Practitioner

## 2018-06-10 VITALS — BP 126/82 | HR 106 | Ht 67.0 in | Wt 197.0 lb

## 2018-06-10 DIAGNOSIS — Z8249 Family history of ischemic heart disease and other diseases of the circulatory system: Secondary | ICD-10-CM | POA: Diagnosis not present

## 2018-06-10 DIAGNOSIS — I4819 Other persistent atrial fibrillation: Secondary | ICD-10-CM | POA: Diagnosis not present

## 2018-06-10 DIAGNOSIS — E785 Hyperlipidemia, unspecified: Secondary | ICD-10-CM | POA: Diagnosis not present

## 2018-06-10 DIAGNOSIS — G2581 Restless legs syndrome: Secondary | ICD-10-CM | POA: Diagnosis not present

## 2018-06-10 DIAGNOSIS — I10 Essential (primary) hypertension: Secondary | ICD-10-CM | POA: Insufficient documentation

## 2018-06-10 DIAGNOSIS — Z9889 Other specified postprocedural states: Secondary | ICD-10-CM | POA: Diagnosis not present

## 2018-06-10 DIAGNOSIS — Z90710 Acquired absence of both cervix and uterus: Secondary | ICD-10-CM | POA: Diagnosis not present

## 2018-06-10 DIAGNOSIS — I4892 Unspecified atrial flutter: Secondary | ICD-10-CM | POA: Insufficient documentation

## 2018-06-10 DIAGNOSIS — R110 Nausea: Secondary | ICD-10-CM | POA: Diagnosis not present

## 2018-06-10 DIAGNOSIS — I4891 Unspecified atrial fibrillation: Secondary | ICD-10-CM | POA: Insufficient documentation

## 2018-06-10 DIAGNOSIS — Z823 Family history of stroke: Secondary | ICD-10-CM | POA: Diagnosis not present

## 2018-06-10 DIAGNOSIS — Z79899 Other long term (current) drug therapy: Secondary | ICD-10-CM | POA: Insufficient documentation

## 2018-06-10 DIAGNOSIS — Z7901 Long term (current) use of anticoagulants: Secondary | ICD-10-CM | POA: Diagnosis not present

## 2018-06-10 MED ORDER — METOPROLOL TARTRATE 25 MG PO TABS: 25.0000 mg | ORAL_TABLET | Freq: Two times a day (BID) | ORAL | 3 refills | Status: DC

## 2018-06-10 NOTE — Patient Instructions (Signed)
Increase metoprolol to 25mg twice a day

## 2018-06-10 NOTE — Progress Notes (Signed)
Primary Care Physician: Richardean Chimera, MD Referring Physician: Dr. Ward Chatters Hailey [name] is a 77 y.o. female with a h/o afib that is in the afib clinic for evaluation. She was dx with afib one year ago  and was placed on amiodarone. She did well until 2 weeks ago when she started having random dizzy/nausea episodes. It was felt that amiodarone may be the culprit and it was stopped. She was then placed on Toprol and continued to have worse spells associated with hypotension. For one of these episodes she presented to the ER and found to be hypotensive. BB/and amlodipine were stopped. Sine then she continues to have some of these episodes. Since stopping the BB/CCB, she has not had any further issues with low blood pressure. She has checked her heart rate with episodes and appears to be in normal range. She is in  afib  on initial visit in the afib clinc  but felt ok. She has felt syncopal but no true syncope.Labs in ER within normal limits.  F/u in afib clinic 10/17. Monitor was placed that showed pt was in atrial flutter the whole time with v rates average at 100 bpm, range 41-140. Her last amiodarone level was too high at 1.4, 3 weeks ago, to consider starting another antiarrythmic. Will repeat today. She is still c/o of daily  H/A, intermittent nausea and fatigue. Her PCP worked up H/A recently without any abnormal findings. Discussed trying low dose BB for rate control to see if I can make her feel better until amio wash out. She will have to watch her BP's carefully to look for untoward hypotension.  F/u in Afib clinic, 11/7. She is still struggling with persisitent afib trying to wash out amiodarone. She is tolerating the low dose BB and will increase to have better rate control during the wait. I think she is less symptomatic than when I first started seeing her though. Again discussed ablation, but does not want to commit at this point.  Today, she denies symptoms of  , chest pain,  shortness of breath, orthopnea, PND, lower extremity edema, dizziness, presyncope, syncope, or neurologic sequela. The patient is tolerating medications without difficulties and is otherwise without complaint today.   Past Medical History:  Diagnosis Date  . Breast nodule 11/16/2013  . H/O hematuria   . HLD (hyperlipidemia)   . Hypertension   . 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 Migraine headache   . Palpitations   . Restless leg syndrome   . Vaginal atrophy    Past Surgical History:  Procedure Laterality Date  . APPENDECTOMY    . CARDIAC CATHETERIZATION  2005    cone  . COLONOSCOPY    . HERNIA REPAIR    . LEFT HEART CATHETERIZATION WITH CORONARY ANGIOGRAM N/A 04/12/2012   Procedure: LEFT HEART CATHETERIZATION WITH CORONARY ANGIOGRAM;  Surgeon: Kathleene Hazel, MD;  Location: [facility] CATH LAB;  Service: Cardiovascular;  Laterality: N/A;  . TOTAL ABDOMINAL HYSTERECTOMY      Current Outpatient Medications  Medication Sig Dispense Refill  . acetaminophen (TYLENOL) 650 MG CR tablet Take 650-1,300 mg by mouth See admin instructions. Take 2 tablets every morning and take 1 tablet at night    . apixaban (ELIQUIS) 5 MG TABS tablet Take 5 mg by mouth 2 (two) times daily.    . Calcium Carbonate-Vit D-Min 600-400 MG-UNIT TABS Take 1 tablet by mouth daily.     . clonazepam (KLONOPIN) 0.125  MG disintegrating tablet Take 0.125 mg by mouth at bedtime.  5  . fluticasone (FLONASE) 50 MCG/ACT nasal spray Place 2 sprays into both nostrils daily as needed for allergies or rhinitis.    Marland Kitchen loratadine (CLARITIN) 10 MG tablet Take 10 mg by mouth daily as needed for allergies.    Marland Kitchen meclizine (ANTIVERT) 25 MG tablet Take 25 mg by mouth as needed for dizziness.    . metoprolol tartrate (LOPRESSOR) 25 MG tablet Take 1 tablet (25 mg total) by mouth 2 (two) times daily. 60 tablet 3  . Multiple Vitamins-Minerals (CENTRUM SILVER) tablet Take 1 tablet by mouth daily.      . ondansetron (ZOFRAN) 4 MG  tablet Take 1 tablet (4 mg total) by mouth every 6 (six) hours as needed. 20 tablet 0   No current facility-administered medications for this encounter.     Allergies  Allergen Reactions  . Amiodarone Itching  . Flomax [Tamsulosin Hcl] Nausea And Vomiting    Social History   Socioeconomic History  . Marital status: Single    Spouse name: Not on file  . Number of children: 0  . Years of education: College  . Highest education level: Not on file  Occupational History  . Occupation: retired  Engineer, production  . Financial resource strain: Not on file  . Food insecurity:    Worry: Not on file    Inability: Not on file  . Transportation needs:    Medical: Not on file    Non-medical: Not on file  Tobacco Use  . Smoking status: Never Smoker  . Smokeless tobacco: Never Used  Substance and Sexual Activity  . Alcohol use: Yes    Comment: rarely; glass of wine with dinner  . Drug use: No  . Sexual activity: Never    Birth control/protection: Surgical    Comment: hyst  Lifestyle  . Physical activity:    Days per week: Not on file    Minutes per session: Not on file  . Stress: Not on file  Relationships  . Social connections:    Talks on phone: Not on file    Gets together: Not on file    Attends religious service: Not on file    Active member of club or organization: Not on file    Attends meetings of clubs or organizations: Not on file    Relationship status: Not on file  . Intimate partner violence:    Fear of current or ex partner: Not on file    Emotionally abused: Not on file    Physically abused: Not on file    Forced sexual activity: Not on file  Other Topics Concern  . Not on file  Social History Narrative   Single. Retired.    Patient lives at home alone.   Caffeine Use: none    Family History  Problem Relation Age of Onset  . Coronary artery disease Father        mid 53s  . Hypertension Mother   . Stroke Brother   . Transient ischemic attack Brother     . Other Brother        on oxygen  . Hypertension Brother   . Stroke Brother   . Heart disease Brother   . Heart attack Brother   . Other Sister        a fib, restless leg  . Other Sister        pressure in eyes    ROS- All systems are  reviewed and negative except as per the HPI above  Physical Exam: Vitals:   06/10/18 0926  BP: 126/82  Pulse: (!) 106  Weight: 89.4 kg  Height: 5\' 7"  (1.702 m)   Wt Readings from Last 3 Encounters:  06/10/18 89.4 kg  05/20/18 88 kg  04/29/18 87.5 kg    Labs: Lab Results  Component Value Date   NA 140 05/22/2018   K 4.4 05/22/2018   CL 107 05/22/2018   CO2 25 05/22/2018   GLUCOSE 109 (H) 05/22/2018   BUN 15 05/22/2018   CREATININE 1.41 (H) 05/22/2018   CALCIUM 9.5 05/22/2018   MG 2.2 05/22/2018   Lab Results  Component Value Date   INR 0.99 04/12/2012   No results found for: CHOL, HDL, LDLCALC, TRIG   GEN- The patient is well appearing, alert and oriented x 3 today.   Head- normocephalic, atraumatic Eyes-  Sclera clear, conjunctiva pink Ears- hearing intact Oropharynx- clear Neck- supple, no JVP Lymph- no cervical lymphadenopathy Lungs- Clear to ausculation bilaterally, normal work of breathing Heart- irregular rate and rhythm, no murmurs, rubs or gallops, PMI not laterally displaced GI- soft, NT, ND, + BS Extremities- no clubbing, cyanosis, or edema MS- no significant deformity or atrophy Skin- no rash or lesion Psych- euthymic mood, full affect Neuro- strength and sensation are intact  EKG- afib at 106 bpm, RBBB, qrs int 110 ms, qt c 521 ms Epic records reviewed Echo-05/2017-Study Conclusions  - Left ventricle: The cavity size was normal. Wall thickness was   normal. Systolic function was normal. The estimated ejection   fraction was in the range of 60% to 65%. Wall motion was normal;   there were no regional wall motion abnormalities. Left   ventricular diastolic function parameters were normal. - Aortic  valve: Mildly to moderately calcified annulus. Trileaflet.   There was mild regurgitation. - Mitral valve: Calcified annulus. Normal thickness leaflets .   There was moderate regurgitation. - Tricuspid valve: There was mild regurgitation. - Pulmonary arteries: PA peak pressure: 34 mm Hg (S).   Assessment and Plan: 1. afib/flutter Off amiodarone 6 weeks, unfortunately she will have to have wash out before any other antiarrythmic can be started, may take 2-3 months to wash out  Will repeat amio level today  Discussed today ablation vrs Tikosyn, at this time she is not ready to commit to ablation Will increase metoprolol to  25 mg bid for better rate control  Pt is to watch BP's closely at home to watch for hypotension on BB  2. H/A/ intermittent nausea Unclear to etiology of these spells However, appear to be getting less   I will see back in 3 weeks  Lupita Leash C. [facility] [address] [place], [place] [address] [phone]

## 2018-06-11 LAB — AMIODARONE LEVEL
Amiodarone Lvl: 1.1 ug/mL (ref 1.0–2.5)
N-DESETHYL-AMIODARONE: 1.2 ug/mL (ref 1.0–2.5)

## 2018-06-16 ENCOUNTER — Encounter (HOSPITAL_COMMUNITY): Payer: Self-pay | Admitting: *Deleted

## 2018-07-07 ENCOUNTER — Encounter (HOSPITAL_COMMUNITY): Payer: Self-pay | Admitting: Nurse Practitioner

## 2018-07-07 ENCOUNTER — Ambulatory Visit (HOSPITAL_COMMUNITY)
Admission: RE | Admit: 2018-07-07 | Discharge: 2018-07-07 | Disposition: A | Discharge status: Home / Self Care | Payer: Medicare Other | Source: Ambulatory Visit | Attending: Nurse Practitioner | Admitting: Nurse Practitioner

## 2018-07-07 VITALS — BP 134/70 | HR 42 | Ht 67.0 in | Wt 200.0 lb

## 2018-07-07 DIAGNOSIS — G2581 Restless legs syndrome: Secondary | ICD-10-CM | POA: Diagnosis not present

## 2018-07-07 DIAGNOSIS — Z823 Family history of stroke: Secondary | ICD-10-CM | POA: Insufficient documentation

## 2018-07-07 DIAGNOSIS — I4891 Unspecified atrial fibrillation: Secondary | ICD-10-CM | POA: Insufficient documentation

## 2018-07-07 DIAGNOSIS — Z8249 Family history of ischemic heart disease and other diseases of the circulatory system: Secondary | ICD-10-CM | POA: Diagnosis not present

## 2018-07-07 DIAGNOSIS — E785 Hyperlipidemia, unspecified: Secondary | ICD-10-CM | POA: Insufficient documentation

## 2018-07-07 DIAGNOSIS — I10 Essential (primary) hypertension: Secondary | ICD-10-CM | POA: Insufficient documentation

## 2018-07-07 DIAGNOSIS — Z9889 Other specified postprocedural states: Secondary | ICD-10-CM | POA: Insufficient documentation

## 2018-07-07 DIAGNOSIS — Z7901 Long term (current) use of anticoagulants: Secondary | ICD-10-CM | POA: Diagnosis not present

## 2018-07-07 DIAGNOSIS — I4892 Unspecified atrial flutter: Secondary | ICD-10-CM | POA: Diagnosis not present

## 2018-07-07 DIAGNOSIS — I4819 Other persistent atrial fibrillation: Secondary | ICD-10-CM | POA: Diagnosis not present

## 2018-07-07 DIAGNOSIS — Z90710 Acquired absence of both cervix and uterus: Secondary | ICD-10-CM | POA: Diagnosis not present

## 2018-07-07 DIAGNOSIS — Z79899 Other long term (current) drug therapy: Secondary | ICD-10-CM | POA: Insufficient documentation

## 2018-07-07 DIAGNOSIS — R110 Nausea: Secondary | ICD-10-CM | POA: Diagnosis not present

## 2018-07-07 MED ORDER — METOPROLOL TARTRATE 25 MG PO TABS: ORAL_TABLET | ORAL | 3 refills | Status: DC

## 2018-07-07 NOTE — Patient Instructions (Signed)
Metoprolol to 1 tablet in AM and 1/2 tablet in PM

## 2018-07-07 NOTE — Progress Notes (Signed)
Primary Care Physician: Richardean Chimera, MD Referring Physician: Dr. Ward Chatters Hailey [name] is a 77 y.o. female with a h/o afib that is in the afib clinic for evaluation. She was dx with afib one year ago  and was placed on amiodarone. She did well until 2 weeks ago when she started having random dizzy/nausea episodes. It was felt that amiodarone may be the culprit and it was stopped. She was then placed on Toprol and continued to have worse spells associated with hypotension. For one of these episodes she presented to the ER and found to be hypotensive. BB/and amlodipine were stopped. Sine then she continues to have some of these episodes. Since stopping the BB/CCB, she has not had any further issues with low blood pressure. She has checked her heart rate with episodes and appears to be in normal range. She is in  afib  on initial visit in the afib clinc  but felt ok. She has felt syncopal but no true syncope.Labs in ER within normal limits.  F/u in afib clinic 10/17. Monitor was placed that showed pt was in atrial flutter the whole time with v rates average at 100 bpm, range 41-140. Her last amiodarone level was too high at 1.4, 3 weeks ago, to consider starting another antiarrythmic. Will repeat today. She is still c/o of daily  H/A, intermittent nausea and fatigue. Her PCP worked up H/A recently without any abnormal findings. Discussed trying low dose BB for rate control to see if I can make her feel better until amio wash out. She will have to watch her BP's carefully to look for untoward hypotension.  F/u in Afib clinic, 11/7. She is still struggling with persisitent afib trying to wash out amiodarone. She is tolerating the low dose BB and will increase to have better rate control during the wait. I think she is less symptomatic than when I first started seeing her though. Again discussed ablation, but does not want to commit at this point.  F/u in afib clinic 12/4. She is in Sinus brady  today and is c/o of some shortness of breath and fatigue, possibly for HR in the 40's. Did not realize she was in rhythm today but has not noted hearing any heart beat in her head recently. amiodarone level is pending, still planning on Tikosyn when amio level is low enough.  Today, she denies symptoms of  , chest pain, shortness of breath, orthopnea, PND, lower extremity edema, dizziness, presyncope, syncope, or neurologic sequela. The patient is tolerating medications without difficulties and is otherwise without complaint today.   Past Medical History:  Diagnosis Date  . Breast nodule 11/16/2013  . H/O hematuria   . HLD (hyperlipidemia)   . Hypertension   . 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 Migraine headache   . Palpitations   . Restless leg syndrome   . Vaginal atrophy    Past Surgical History:  Procedure Laterality Date  . APPENDECTOMY    . CARDIAC CATHETERIZATION  2005    cone  . COLONOSCOPY    . HERNIA REPAIR    . LEFT HEART CATHETERIZATION WITH CORONARY ANGIOGRAM N/A 04/12/2012   Procedure: LEFT HEART CATHETERIZATION WITH CORONARY ANGIOGRAM;  Surgeon: Kathleene Hazel, MD;  Location[facility] CATH LAB;  Service: Cardiovascular;  Laterality: N/A;  . TOTAL ABDOMINAL HYSTERECTOMY      Current Outpatient Medications  Medication Sig Dispense Refill  . acetaminophen (TYLENOL) 650 MG CR tablet  Take 650-1,300 mg by mouth See admin instructions. Take 2 tablets every morning and take 1 tablet at night    . apixaban (ELIQUIS) 5 MG TABS tablet Take 5 mg by mouth 2 (two) times daily.    . Calcium Carbonate-Vit D-Min 600-400 MG-UNIT TABS Take 1 tablet by mouth daily.     . clonazepam (KLONOPIN) 0.125 MG disintegrating tablet Take 0.125 mg by mouth at bedtime.  5  . fluticasone (FLONASE) 50 MCG/ACT nasal spray Place 2 sprays into both nostrils daily as needed for allergies or rhinitis.    Marland Kitchen. loratadine (CLARITIN) 10 MG tablet Take 10 mg by mouth daily as needed for allergies.    Marland Kitchen.  meclizine (ANTIVERT) 25 MG tablet Take 25 mg by mouth as needed for dizziness.    . metoprolol tartrate (LOPRESSOR) 25 MG tablet Take 1 tablet in the AM and 1/2 tablet in the PM 60 tablet 3  . Multiple Vitamins-Minerals (CENTRUM SILVER) tablet Take 1 tablet by mouth daily.      . ondansetron (ZOFRAN) 4 MG tablet Take 1 tablet (4 mg total) by mouth every 6 (six) hours as needed. (Patient not taking: Reported on 07/07/2018) 20 tablet 0   No current facility-administered medications for this encounter.     Allergies  Allergen Reactions  . Amiodarone Itching  . Flomax [Tamsulosin Hcl] Nausea And Vomiting    Social History   Socioeconomic History  . Marital status: Single    Spouse name: Not on file  . Number of children: 0  . Years of education: College  . Highest education level: Not on file  Occupational History  . Occupation: retired  Engineer, productionocial Needs  . Financial resource strain: Not on file  . Food insecurity:    Worry: Not on file    Inability: Not on file  . Transportation needs:    Medical: Not on file    Non-medical: Not on file  Tobacco Use  . Smoking status: Never Smoker  . Smokeless tobacco: Never Used  Substance and Sexual Activity  . Alcohol use: Yes    Comment: rarely; glass of wine with dinner  . Drug use: No  . Sexual activity: Never    Birth control/protection: Surgical    Comment: hyst  Lifestyle  . Physical activity:    Days per week: Not on file    Minutes per session: Not on file  . Stress: Not on file  Relationships  . Social connections:    Talks on phone: Not on file    Gets together: Not on file    Attends religious service: Not on file    Active member of club or organization: Not on file    Attends meetings of clubs or organizations: Not on file    Relationship status: Not on file  . Intimate partner violence:    Fear of current or ex partner: Not on file    Emotionally abused: Not on file    Physically abused: Not on file    Forced sexual  activity: Not on file  Other Topics Concern  . Not on file  Social History Narrative   Single. Retired.    Patient lives at home alone.   Caffeine Use: none    Family History  Problem Relation Age of Onset  . Coronary artery disease Father        mid 150s  . Hypertension Mother   . Stroke Brother   . Transient ischemic attack Brother   . Other Brother  on oxygen  . Hypertension Brother   . Stroke Brother   . Heart disease Brother   . Heart attack Brother   . Other Sister        a fib, restless leg  . Other Sister        pressure in eyes    ROS- All systems are reviewed and negative except as per the HPI above  Physical Exam: Vitals:   07/07/18 1023  BP: 134/70  Pulse: (!) 42  SpO2: 98%  Weight: 90.7 kg  Height: 5\' 7"  (1.702 m)   Wt Readings from Last 3 Encounters:  07/07/18 90.7 kg  06/10/18 89.4 kg  05/20/18 88 kg    Labs: Lab Results  Component Value Date   NA 140 05/22/2018   K 4.4 05/22/2018   CL 107 05/22/2018   CO2 25 05/22/2018   GLUCOSE 109 (H) 05/22/2018   BUN 15 05/22/2018   CREATININE 1.41 (H) 05/22/2018   CALCIUM 9.5 05/22/2018   MG 2.2 05/22/2018   Lab Results  Component Value Date   INR 0.99 04/12/2012   No results found for: CHOL, HDL, LDLCALC, TRIG   GEN- The patient is well appearing, alert and oriented x 3 today.   Head- normocephalic, atraumatic Eyes-  Sclera clear, conjunctiva pink Ears- hearing intact Oropharynx- clear Neck- supple, no JVP Lymph- no cervical lymphadenopathy Lungs- Clear to ausculation bilaterally, normal work of breathing Heart- irregular rate and rhythm, no murmurs, rubs or gallops, PMI not laterally displaced GI- soft, NT, ND, + BS Extremities- no clubbing, cyanosis, or edema MS- no significant deformity or atrophy Skin- no rash or lesion Psych- euthymic mood, full affect Neuro- strength and sensation are intact  EKG- S brady at 42 bpm, qrs int 110 ms, qt c 450 ms Epic records  reviewed Echo-05/2017-Study Conclusions  - Left ventricle: The cavity size was normal. Wall thickness was   normal. Systolic function was normal. The estimated ejection   fraction was in the range of 60% to 65%. Wall motion was normal;   there were no regional wall motion abnormalities. Left   ventricular diastolic function parameters were normal. - Aortic valve: Mildly to moderately calcified annulus. Trileaflet.   There was mild regurgitation. - Mitral valve: Calcified annulus. Normal thickness leaflets .   There was moderate regurgitation. - Tricuspid valve: There was mild regurgitation. - Pulmonary arteries: PA peak pressure: 34 mm Hg (S).   Assessment and Plan: 1. Afib/flutter Back in Sinus brady today at 42 bpm Will reduce BB back to 37.5 mg total  daily Off amiodarone x 9-10  weeks, unfortunately she will have to have wash out before any other antiarrythmic can be started, may take 2-3 months to wash out  Will repeat amio level today   2. H/A/ intermittent nausea Unclear to etiology of these spells However, pt did not c/o of these today   I will see back depending on amio level  Lupita Leash [facility] [address] [place], [place] [address] [phone]

## 2018-07-08 LAB — AMIODARONE LEVEL
Amiodarone Lvl: 0.6 ug/mL — ABNORMAL LOW (ref 1.0–2.5)
N-Desethyl-Amiodarone: 0.7 ug/mL — ABNORMAL LOW (ref 1.0–2.5)

## 2018-07-09 ENCOUNTER — Other Ambulatory Visit (HOSPITAL_COMMUNITY): Payer: Self-pay | Admitting: *Deleted

## 2018-07-09 DIAGNOSIS — I4819 Other persistent atrial fibrillation: Secondary | ICD-10-CM

## 2018-07-14 ENCOUNTER — Encounter (HOSPITAL_COMMUNITY): Payer: Self-pay | Admitting: Emergency Medicine

## 2018-07-14 ENCOUNTER — Emergency Department (HOSPITAL_COMMUNITY): Payer: Medicare Other

## 2018-07-14 ENCOUNTER — Observation Stay (HOSPITAL_COMMUNITY)
Admission: EM | Admit: 2018-07-14 | Discharge: 2018-07-15 | Disposition: A | Discharge status: Home / Self Care | Payer: Medicare Other | Attending: Physician Assistant | Admitting: Physician Assistant

## 2018-07-14 ENCOUNTER — Other Ambulatory Visit: Payer: Self-pay

## 2018-07-14 ENCOUNTER — Telehealth (HOSPITAL_COMMUNITY): Payer: Self-pay | Admitting: *Deleted

## 2018-07-14 DIAGNOSIS — Z7901 Long term (current) use of anticoagulants: Secondary | ICD-10-CM | POA: Insufficient documentation

## 2018-07-14 DIAGNOSIS — I4819 Other persistent atrial fibrillation: Secondary | ICD-10-CM | POA: Diagnosis not present

## 2018-07-14 DIAGNOSIS — R42 Dizziness and giddiness: Secondary | ICD-10-CM | POA: Diagnosis present

## 2018-07-14 DIAGNOSIS — R001 Bradycardia, unspecified: Principal | ICD-10-CM | POA: Insufficient documentation

## 2018-07-14 DIAGNOSIS — I4891 Unspecified atrial fibrillation: Secondary | ICD-10-CM | POA: Diagnosis present

## 2018-07-14 DIAGNOSIS — I480 Paroxysmal atrial fibrillation: Secondary | ICD-10-CM | POA: Insufficient documentation

## 2018-07-14 DIAGNOSIS — Z79899 Other long term (current) drug therapy: Secondary | ICD-10-CM | POA: Insufficient documentation

## 2018-07-14 DIAGNOSIS — H8109 Meniere's disease, unspecified ear: Secondary | ICD-10-CM | POA: Insufficient documentation

## 2018-07-14 DIAGNOSIS — I10 Essential (primary) hypertension: Secondary | ICD-10-CM | POA: Insufficient documentation

## 2018-07-14 HISTORY — DX: Unspecified atrial fibrillation: I48.91

## 2018-07-14 LAB — CBC WITH DIFFERENTIAL/PLATELET
Abs Immature Granulocytes: 0.02 10*3/uL (ref 0.00–0.07)
BASOS PCT: 1 %
Basophils Absolute: 0 10*3/uL (ref 0.0–0.1)
Eosinophils Absolute: 0.1 10*3/uL (ref 0.0–0.5)
Eosinophils Relative: 1 %
HCT: 43.7 % (ref 36.0–46.0)
Hemoglobin: 13.6 g/dL (ref 12.0–15.0)
Immature Granulocytes: 0 %
Lymphocytes Relative: 23 %
Lymphs Abs: 1.5 10*3/uL (ref 0.7–4.0)
MCH: 32.3 pg (ref 26.0–34.0)
MCHC: 31.1 g/dL (ref 30.0–36.0)
MCV: 103.8 fL — ABNORMAL HIGH (ref 80.0–100.0)
MONOS PCT: 6 %
Monocytes Absolute: 0.4 10*3/uL (ref 0.1–1.0)
Neutro Abs: 4.5 10*3/uL (ref 1.7–7.7)
Neutrophils Relative %: 69 %
Platelets: 228 10*3/uL (ref 150–400)
RBC: 4.21 MIL/uL (ref 3.87–5.11)
RDW: 13.5 % (ref 11.5–15.5)
WBC: 6.5 10*3/uL (ref 4.0–10.5)
nRBC: 0 % (ref 0.0–0.2)

## 2018-07-14 LAB — URINALYSIS, ROUTINE W REFLEX MICROSCOPIC
Bilirubin Urine: NEGATIVE
Glucose, UA: NEGATIVE mg/dL
Hgb urine dipstick: NEGATIVE
Ketones, ur: NEGATIVE mg/dL
Leukocytes, UA: NEGATIVE
Nitrite: NEGATIVE
Protein, ur: NEGATIVE mg/dL
Specific Gravity, Urine: 1.006 (ref 1.005–1.030)
pH: 7 (ref 5.0–8.0)

## 2018-07-14 LAB — BASIC METABOLIC PANEL
ANION GAP: 9 (ref 5–15)
BUN: 17 mg/dL (ref 8–23)
CO2: 24 mmol/L (ref 22–32)
Calcium: 9.4 mg/dL (ref 8.9–10.3)
Chloride: 108 mmol/L (ref 98–111)
Creatinine, Ser: 1.42 mg/dL — ABNORMAL HIGH (ref 0.44–1.00)
GFR calc Af Amer: 41 mL/min — ABNORMAL LOW (ref >60)
GFR calc non Af Amer: 36 mL/min — ABNORMAL LOW (ref >60)
Glucose, Bld: 118 mg/dL — ABNORMAL HIGH (ref 70–99)
Potassium: 4.2 mmol/L (ref 3.5–5.1)
Sodium: 141 mmol/L (ref 135–145)

## 2018-07-14 LAB — I-STAT TROPONIN, ED
Troponin i, poc: 0.01 ng/mL (ref 0.00–0.08)
Troponin i, poc: 0.01 ng/mL (ref 0.00–0.08)

## 2018-07-14 MED ORDER — APIXABAN 5 MG PO TABS
5.0000 mg | ORAL_TABLET | Freq: Two times a day (BID) | ORAL | Status: DC
  Administered 2018-07-14 – 2018-07-15 (×2): 5 mg via ORAL
  Filled 2018-07-14 (×3): qty 1

## 2018-07-14 MED ORDER — LORATADINE 10 MG PO TABS: 10.0000 mg | ORAL_TABLET | Freq: Every day | ORAL | Status: DC | PRN

## 2018-07-14 MED ORDER — CLONAZEPAM 0.125 MG PO TBDP
0.1250 mg | ORAL_TABLET | Freq: Every day | ORAL | Status: DC
  Administered 2018-07-14: 0.125 mg via ORAL
  Filled 2018-07-14: qty 1

## 2018-07-14 MED ORDER — NITROGLYCERIN 0.4 MG SL SUBL: 0.4000 mg | SUBLINGUAL_TABLET | SUBLINGUAL | Status: DC | PRN

## 2018-07-14 MED ORDER — ACETAMINOPHEN 325 MG PO TABS
650.0000 mg | ORAL_TABLET | ORAL | Status: DC | PRN
  Administered 2018-07-14 – 2018-07-15 (×2): 650 mg via ORAL
  Filled 2018-07-14 (×2): qty 2

## 2018-07-14 MED ORDER — ONDANSETRON HCL 4 MG/2ML IJ SOLN: 4.0000 mg | Freq: Four times a day (QID) | INTRAMUSCULAR | Status: DC | PRN

## 2018-07-14 MED ORDER — SODIUM CHLORIDE 0.9 % IV BOLUS
1000.0000 mL | Freq: Once | INTRAVENOUS | Status: AC
  Administered 2018-07-14: 1000 mL via INTRAVENOUS

## 2018-07-14 MED ORDER — MECLIZINE HCL 25 MG PO TABS: 25.0000 mg | ORAL_TABLET | ORAL | Status: DC | PRN

## 2018-07-14 MED ORDER — CALCIUM CARBONATE-VIT D-MIN 600-400 MG-UNIT PO TABS: 1.0000 | ORAL_TABLET | Freq: Every day | ORAL | Status: DC

## 2018-07-14 MED ORDER — FLUTICASONE PROPIONATE 50 MCG/ACT NA SUSP
2.0000 | Freq: Every day | NASAL | Status: DC | PRN
  Filled 2018-07-14: qty 16

## 2018-07-14 MED ORDER — ONDANSETRON HCL 4 MG PO TABS: 4.0000 mg | ORAL_TABLET | Freq: Four times a day (QID) | ORAL | Status: DC | PRN

## 2018-07-14 MED ORDER — CALCIUM CARBONATE-VITAMIN D 500-200 MG-UNIT PO TABS
1.0000 | ORAL_TABLET | Freq: Every day | ORAL | Status: DC
  Administered 2018-07-15: 1 via ORAL
  Filled 2018-07-14: qty 1

## 2018-07-14 NOTE — ED Notes (Signed)
Pt being transported to Xray 

## 2018-07-14 NOTE — ED Notes (Signed)
Pt given a happy meal and a diet coke

## 2018-07-14 NOTE — H&P (Signed)
Cardiology Admission History and Physical:   Patient ID: Hailey Reamsla M Apple MRN: [id number]; DOB: 01/16/1941   Admission date: 07/14/2018  Primary Care Provider: Richardean Chimeraaniel, Terry G, MD Primary Cardiologist: Prentice DockerSuresh Koneswaran, MD  Primary Electrophysiologist:  None   Chief Complaint:  Dizziness and Nausea  Patient Profile:   Hailey [name] is a 77 y.o. female with a history of persistent atrial fibrillation on Eliquis, hypertension, hyperlipidemia, who is being seen today for the evaluation of bradycardia at the request of Shawn Joy, PA-C (Emergency Department).  History of Present Illness:   Hailey [name] is a 77 year old female with the above history who is followed by Dr. Purvis SheffieldKoneswaran and the atrial fibrillation clinic. Patient diagnosed with atrial fibrillation last years and was placed on Amiodarone. She was doing well on this until 04/2018 when she started to have random episodes of dizziness and nausea. It was felt this was due to the Amiodarone so it was stopped and she was started on Toprol. However, she continued to have these episodes with associated hypotensive. Her beta blocker and Amlodipine were then discontinued but she continued to have these episodes. A Zio Monitor was placed on 04/29/2018 and showed persistent atrial fibrillation and atrial flutter (burden 100%) with average ventricular rate of 100 bpm (range 55 - 145 bpm). She was restarted on a low dose beta blocker on 05/20/2018 at her visit with the atrial fibrillation clinic while waiting for Amiodarone to wash out. She was last seen by the atrial fibrillation clinic on 07/07/2018 at which time patient denied any cardiac symptoms; however, she was noted to be in sinus bradycardia with heart rate of 42 bpm. Beta blocker dose was reduced. She is currently taking Lopressor 25mg  in the morning and 12.5mg  in the evening. Still waiting on Amiodarone wash out. Plan is to start Tikosyn when Amiodarone level is low enough.   Patient [facility]  [facility] for evaluation of a similar episode of dizziness and nausea. Patient states she has been feeling well the last few days; however, she reports feeling sick after waking up around 5:30am this morning. She states she felt very dizzy, nauseous, and felt like she was going to "pass out." Patient got up and ate breakfast and took her medicine, including the Lopressor. She thought her symptoms would just "wear off" but they got significantly worse. Around 8am, she checked her vitals and her blood pressure was 137/89 and her pulse was 41 bpm. Symptoms persisted for another hour and around 9am patient decided to come to the ED. Patient denies any chest pain, vision changes, or syncope with these episodes. She occasionally has palpitations. She reports chronic shortness of breath with no recent changes. She occasionally has a "burning" feeling in her chest which she attributes to her hiatal hernia. She denies any recent fevers or illnesses.  Upon arrival to the ED, patient bradycardic. EKG showed sinus bradycardia, rate 51 bpm, with no acute ischemic changes. I-stat troponin negative. Chest x-ray showed no acute findings. CBC unremarkable. Na 141, K 4.2, Glucose 118, SCr 1.42.  Currently, patient continues to report feeling a little dizzy and nauseous but states symptoms have improved some since this morning. She is tired of feeling this way, and states that she is very interested in having an ablation now.    Past Medical History:  Diagnosis Date  . Atrial fibrillation (HCC)   . Breast nodule 11/16/2013  . H/O hematuria   . HLD (hyperlipidemia)   . Hypertension   .  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 Migraine headache   . Palpitations   . Restless leg syndrome   . Vaginal atrophy     Past Surgical History:  Procedure Laterality Date  . APPENDECTOMY    . CARDIAC CATHETERIZATION  2005    cone  . COLONOSCOPY    . HERNIA REPAIR    . LEFT HEART CATHETERIZATION WITH  CORONARY ANGIOGRAM N/A 04/12/2012   Procedure: LEFT HEART CATHETERIZATION WITH CORONARY ANGIOGRAM;  Surgeon: Kathleene Hazel, MD;  Location: [facility] CATH LAB;  Service: Cardiovascular;  Laterality: N/A;  . TOTAL ABDOMINAL HYSTERECTOMY       Medications Prior to Admission: Prior to Admission medications   Medication Sig Start Date End Date Taking? Authorizing Provider  acetaminophen (TYLENOL) 650 MG CR tablet Take 650-1,300 mg by mouth See admin instructions. Take 2 tablets every morning and take 1 tablet at night    [provider]  apixaban (ELIQUIS) 5 MG TABS tablet Take 5 mg by mouth 2 (two) times daily.    [provider]  Calcium Carbonate-Vit D-Min 600-400 MG-UNIT TABS Take 1 tablet by mouth daily.     [provider]  clonazepam (KLONOPIN) 0.125 MG disintegrating tablet Take 0.125 mg by mouth at bedtime. 01/12/15   [provider]  fluticasone (FLONASE) 50 MCG/ACT nasal spray Place 2 sprays into both nostrils daily as needed for allergies or rhinitis.    [provider]  loratadine (CLARITIN) 10 MG tablet Take 10 mg by mouth daily as needed for allergies.    [provider]  meclizine (ANTIVERT) 25 MG tablet Take 25 mg by mouth as needed for dizziness.    [provider]  metoprolol tartrate (LOPRESSOR) 25 MG tablet Take 1 tablet in the AM and 1/2 tablet in the PM 07/07/18   Newman Nip, NP  Multiple Vitamins-Minerals (CENTRUM SILVER) tablet Take 1 tablet by mouth daily.      [provider]  ondansetron (ZOFRAN) 4 MG tablet Take 1 tablet (4 mg total) by mouth every 6 (six) hours as needed. Patient not taking: Reported on 07/07/2018 04/24/18   Dione Booze, MD     Allergies:    Allergies  Allergen Reactions  . Amiodarone Itching  . Flomax [Tamsulosin Hcl] Nausea And Vomiting    Social History:   Social History   Socioeconomic History  . Marital status: Single    Spouse name: Not on file  . Number of  children: 0  . Years of education: College  . Highest education level: Not on file  Occupational History  . Occupation: retired  Engineer, production  . Financial resource strain: Not on file  . Food insecurity:    Worry: Not on file    Inability: Not on file  . Transportation needs:    Medical: Not on file    Non-medical: Not on file  Tobacco Use  . Smoking status: Never Smoker  . Smokeless tobacco: Never Used  Substance and Sexual Activity  . Alcohol use: Yes    Comment: rarely; glass of wine with dinner  . Drug use: No  . Sexual activity: Never    Birth control/protection: Surgical    Comment: hyst  Lifestyle  . Physical activity:    Days per week: Not on file    Minutes per session: Not on file  . Stress: Not on file  Relationships  . Social connections:    Talks on phone: Not on file  Gets together: Not on file    Attends religious service: Not on file    Active member of club or organization: Not on file    Attends meetings of clubs or organizations: Not on file    Relationship status: Not on file  . Intimate partner violence:    Fear of current or ex partner: Not on file    Emotionally abused: Not on file    Physically abused: Not on file    Forced sexual activity: Not on file  Other Topics Concern  . Not on file  Social History Narrative   Single. Retired.    Patient lives at home alone.   Caffeine Use: none    Family History:   The patient's family history includes Coronary artery disease in her father; Heart attack in her brother; Heart disease in her brother; Hypertension in her brother and mother; Other in her brother, sister, and sister; Stroke in her brother and brother; Transient ischemic attack in her brother.    ROS:  Please see the history of present illness.  Review of Systems  Constitutional: Negative for chills and fever.  HENT: Negative for congestion.   Eyes: Negative for blurred vision and double vision.  Respiratory: Positive for cough  ("dry, hacking" ) and shortness of breath (chronic).   Cardiovascular: Positive for chest pain and palpitations. Negative for orthopnea, leg swelling and PND.  Gastrointestinal: Positive for heartburn and nausea. Negative for blood in stool and vomiting.  Genitourinary: Negative for hematuria.  Musculoskeletal: Negative for myalgias.  Neurological: Positive for dizziness. Negative for loss of consciousness.  Endo/Heme/Allergies: Does not bruise/bleed easily.  Psychiatric/Behavioral: Negative for substance abuse.    Physical Exam/Data:   Vitals:   07/14/18 1430 07/14/18 1500 07/14/18 1515 07/14/18 1530  BP: (!) 169/63 (!) 159/73  (!) 168/82  Pulse: 63 (!) 57 63 (!) 59  Resp: 17 15 18 15   Temp:      TempSrc:      SpO2: 99% 100% 100% 100%    Intake/Output Summary (Last 24 hours) at 07/14/2018 1649 Last data filed at 07/14/2018 1300 Gross per 24 hour  Intake 1000 ml  Output -  Net 1000 ml   There were no vitals filed for this visit. There is no height or weight on file to calculate BMI.  General:  Well nourished, well developed 77 year old Caucasian female resting comfortably. Alert and in no acute distress. HEENT: Head normocephalic and atraumatic. Sclera clear.  Neck: Supple.  Vascular: No carotid bruits. Radial and distal pedal pulses 2+ and equal bilaterally. Cardiac:  Bradycardic with regular rhythm. Distinct S1 and S2. No murmurs, gallops, or rubs. Lungs: No increased work of breathing. Clear to auscultation bilaterally. No wheezing, rhonchi, or rales. Abd: Soft, non-distended, and non-tender. Bowel sounds present.   Ext: No lower extremity edema.  Musculoskeletal:  No deformities. BUE and BLE strength normal and equal. Skin: Warm and dry. Neuro:  No focal deficits. Psych:  Normal affect. Responds appropriately.    EKG:  The ECG that was done was personally reviewed and demonstrates sinus bradycardia, rate 51 bpm, with RBBB and borderline 1st degree AV  block.  Telemetry: Telemetry was personally reviewed and demonstrates sinus rhythm with hearts rates ranging between 40's and 60's.   Relevant CV Studies: Zio Monitor 04/29/2018 to 05/13/2018: - Persistent atrial fibrillation and atrial flutter (burden 100%) - Average ventricular rate 100 bpm (range 55-145 bpm) - No prolonged bradycardias, pauses, or AV block _______________  Echocardiogram 05/20/2017:  Study Conclusions: - Left ventricle: The cavity size was normal. Wall thickness was normal. Systolic function was normal. The estimated ejection fraction was in the range of 60% to 65%. Wall motion was normal; there were no regional wall motion abnormalities. Left ventricular diastolic function parameters were normal. - Aortic valve: Mildly to moderately calcified annulus. Trileaflet. There was mild regurgitation. - Mitral valve: Calcified annulus. Normal thickness leaflets . There was moderate regurgitation. - Tricuspid valve: There was mild regurgitation. - Pulmonary arteries: PA peak pressure: 34 mm Hg (S).  Laboratory Data:  Chemistry Recent Labs  Lab 07/14/18 1037  NA 141  K 4.2  CL 108  CO2 24  GLUCOSE 118*  BUN 17  CREATININE 1.42*  CALCIUM 9.4  GFRNONAA 36*  GFRAA 41*  ANIONGAP 9    No results for input(s): PROT, ALBUMIN, AST, ALT, ALKPHOS, BILITOT in the last 168 hours. Hematology Recent Labs  Lab 07/14/18 1037  WBC 6.5  RBC 4.21  HGB 13.6  HCT 43.7  MCV 103.8*  MCH 32.3  MCHC 31.1  RDW 13.5  PLT 228   Cardiac EnzymesNo results for input(s): TROPONINI in the last 168 hours.  Recent Labs  Lab 07/14/18 1137 07/14/18 1627  TROPIPOC 0.01 0.01    BNPNo results for input(s): BNP, PROBNP in the last 168 hours.  DDimer No results for input(s): DDIMER in the last 168 hours.  Radiology/Studies:  Dg Chest 2 View  Result Date: 07/14/2018 CLINICAL DATA:  Rapid heart rate today. History of CHF. Recent medication change. EXAM: CHEST - 2 VIEW  COMPARISON:  PA and lateral chest x-ray of May 22, 2018 FINDINGS: The radiographs were obtained in the sitting position. The lungs are well-expanded. There is no focal infiltrate. There is no pleural effusion. The cardiac silhouette is top-normal in size. The pulmonary vascularity is not clearly engorged. IMPRESSION: No definite pneumonia, pulmonary edema, or other acute cardiopulmonary abnormality. The images are degraded due to the a sitting position. Electronically Signed   By: David  [place] M.D.   On: 07/14/2018 11:05    Assessment and Plan:   Atrial Fibrillation with Tachy-brady Syndrome  - Patient has history of atrial fibrillation and is seen in the atrial fibrillation clinic. She was previously on Amiodarone until 04/2018 when she started to have episodes of dizziness and nausea. Amiodarone was discontinued but these episodes have persisted. Beta blocker dose has been titrated multiple times over the last couple of months. Per last clinic note on 07/07/2018, plan was to start Tikosyn once Amiodarone level is low enough. - Patient presents to the ED today with another episode of dizziness and nausea. - EKG showed sinus bradycardia with rate of 51 bpm.  - Telemetry shows sinus rhythm with rates ranging from 40's to 60's. - Will discontinue home Lopressor at this time given heart rate in the mid 40's.  - Continue chronic anticoagulation with Eliquis 5mg  twice daily.  - Patient states she is tired of feeling this way and is now very interested in having an ablation. Discussed with MD - EP to see in the morning.   Severity of Illness: The appropriate patient status for this patient is INPATIENT. Inpatient status is judged to be reasonable and necessary in order to provide the required intensity of service to ensure the patient's safety. The patient's presenting symptoms, physical exam findings, and initial radiographic and laboratory data in the context of their chronic comorbidities is felt to  place them at high risk for further clinical deterioration. Furthermore,  it is not anticipated that the patient will be medically stable for discharge from the hospital within 2 midnights of admission. The following factors support the patient status of inpatient.   " The patient's presenting symptoms include dizziness, nausea, presyncope. " The worrisome physical exam findings include bradycardia. " The initial radiographic and laboratory data are not worrisome. " The chronic co-morbidities include hypertension and hyperlipidemia.   * I certify that at the point of admission it is my clinical judgment that the patient will require inpatient hospital care spanning beyond 2 midnights from the point of admission due to high intensity of service, high risk for further deterioration and high frequency of surveillance required.*    For questions or updates, please contact [facility] Please consult [email or web address] for contact info under        Signed, Corrin Parker, PA-C  07/14/2018 4:49 PM

## 2018-07-14 NOTE — ED Notes (Signed)
Attempted report 

## 2018-07-14 NOTE — ED Notes (Signed)
Pt ambulated to RR .  

## 2018-07-14 NOTE — ED Provider Notes (Signed)
[facility] Provider Note   CSN: [id number] Arrival date & time: 07/14/18  1001     History   Chief Complaint Chief Complaint  Patient presents with  . Nausea  . Near Syncope    HPI Hailey [name] is a 77 y.o. female.  HPI   Hailey [name] is a 77 y.o. female, with a history of Mnire's, A. fib, and HTN, presenting to the ED with lightheadedness and nausea noted this morning upon waking at [facility]5AM. Patient got out of bed and walked around for a while, took her morning medications, including her metoprolol.  She walked back to her room and laid down for a while. She was also intermittently taking her pulse and blood pressure and noted she felt better when her pulse rose above about 45 bpm, but lightheadedness and nausea recurred when pulse would drop below 45.  Accompanied by shortness of breath. Taken off amiodarone 3-4 months ago. Put on metoprolol, last changed on 12/4 from 25 mg BID to 25mg  in AM and 12.5 mg in PM.   Denies fever/chills, recent illness, vomiting, diarrhea, abdominal pain, chest pain, syncope, lower extremity edema/pain, orthopnea, or any other complaints.  Cardiologist: Prentice DockerSuresh Koneswaran  Past Medical History:  Diagnosis Date  . Atrial fibrillation (HCC)   . Breast nodule 11/16/2013  . H/O hematuria   . HLD (hyperlipidemia)   . Hypertension   . 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 Migraine headache   . Palpitations   . Restless leg syndrome   . Vaginal atrophy     Patient Active Problem List   Diagnosis Date Noted  . Vaginal dryness 01/28/2017  . Vaginal atrophy 01/28/2017  . Breast nodule 11/16/2013  . Breast mass, right 12/22/2012  . OVERWEIGHT 04/13/2009  . GENERALIZED ANXIETY DISORDER 04/13/2009  . CORONARY ARTERY DISEASE 04/13/2009  . PALPITATIONS 04/03/2009    Past Surgical History:  Procedure Laterality Date  . APPENDECTOMY    . CARDIAC CATHETERIZATION  2005    cone  . COLONOSCOPY      . HERNIA REPAIR    . LEFT HEART CATHETERIZATION WITH CORONARY ANGIOGRAM N/A 04/12/2012   Procedure: LEFT HEART CATHETERIZATION WITH CORONARY ANGIOGRAM;  Surgeon: Kathleene Hazelhristopher D McAlhany, MD;  [facility];  Service: Cardiovascular;  Laterality: N/A;  . TOTAL ABDOMINAL HYSTERECTOMY       OB History    Gravida  1   Para      Term      Preterm      AB  1   Living  0     SAB  1   TAB      Ectopic      Multiple      Live Births               Home Medications    Prior to Admission medications   Medication Sig Start Date End Date Taking? Authorizing Provider  acetaminophen (TYLENOL) 650 MG CR tablet Take 650-1,300 mg by mouth See admin instructions. Take 2 tablets every morning and take 1 tablet at night    [provider]  apixaban (ELIQUIS) 5 MG TABS tablet Take 5 mg by mouth 2 (two) times daily.    [provider]  Calcium Carbonate-Vit D-Min 600-400 MG-UNIT TABS Take 1 tablet by mouth daily.     [provider]  clonazepam (KLONOPIN) 0.125 MG disintegrating tablet Take 0.125 mg by mouth at bedtime. 01/12/15  [provider]  fluticasone (FLONASE) 50 MCG/ACT nasal spray Place 2 sprays into both nostrils daily as needed for allergies or rhinitis.    [provider]  loratadine (CLARITIN) 10 MG tablet Take 10 mg by mouth daily as needed for allergies.    [provider]  meclizine (ANTIVERT) 25 MG tablet Take 25 mg by mouth as needed for dizziness.    [provider]  metoprolol tartrate (LOPRESSOR) 25 MG tablet Take 1 tablet in the AM and 1/2 tablet in the PM 07/07/18   Newman Nip, NP  Multiple Vitamins-Minerals (CENTRUM SILVER) tablet Take 1 tablet by mouth daily.      [provider]  ondansetron (ZOFRAN) 4 MG tablet Take 1 tablet (4 mg total) by mouth every 6 (six) hours as needed. Patient not taking: Reported on 07/07/2018 04/24/18   Dione Booze, MD    Family History Family History   Problem Relation Age of Onset  . Coronary artery disease Father        mid 59s  . Hypertension Mother   . Stroke Brother   . Transient ischemic attack Brother   . Other Brother        on oxygen  . Hypertension Brother   . Stroke Brother   . Heart disease Brother   . Heart attack Brother   . Other Sister        a fib, restless leg  . Other Sister        pressure in eyes    Social History Social History   Tobacco Use  . Smoking status: Never Smoker  . Smokeless tobacco: Never Used  Substance Use Topics  . Alcohol use: Yes    Comment: rarely; glass of wine with dinner  . Drug use: No     Allergies   Amiodarone and Flomax [tamsulosin hcl]   Review of Systems Review of Systems  Constitutional: Negative for chills, diaphoresis and fever.  Respiratory: Positive for shortness of breath.   Cardiovascular: Negative for chest pain, palpitations and leg swelling.       Bradycardia  Gastrointestinal: Positive for nausea. Negative for abdominal pain, diarrhea and vomiting.  Neurological: Positive for light-headedness. Negative for syncope, weakness and numbness.  Psychiatric/Behavioral: Negative for confusion.  All other systems reviewed and are negative.    Physical Exam Updated Vital Signs BP (!) 161/69   Pulse (!) 52   Temp 97.7 F (36.5 C) (Oral)   Resp 17   SpO2 99%   Physical Exam  Constitutional: She appears well-developed and well-nourished. No distress.  HENT:  Head: Normocephalic and atraumatic.  Eyes: Conjunctivae are normal.  Neck: Neck supple.  Cardiovascular: Regular rhythm, normal heart sounds and intact distal pulses. Bradycardia present.  Pulmonary/Chest: Effort normal and breath sounds normal. No respiratory distress.  Abdominal: Soft. There is no tenderness. There is no guarding.  Musculoskeletal: She exhibits no edema.  Lymphadenopathy:    She has no cervical adenopathy.  Neurological: She is alert.  Skin: Skin is warm and dry. She is not  diaphoretic.  Psychiatric: She has a normal mood and affect. Her behavior is normal.  Nursing note and vitals reviewed.    ED Treatments / Results  Labs (all labs ordered are listed, but only abnormal results are displayed) Labs Reviewed  URINALYSIS, ROUTINE W REFLEX MICROSCOPIC - Abnormal; Notable for the following components:      Result Value   Color, Urine STRAW (*)    All other components within normal  limits  BASIC METABOLIC PANEL - Abnormal; Notable for the following components:   Glucose, Bld 118 (*)    Creatinine, Ser 1.42 (*)    GFR calc non Af Amer 36 (*)    GFR calc Af Amer 41 (*)    All other components within normal limits  CBC WITH DIFFERENTIAL/PLATELET - Abnormal; Notable for the following components:   MCV 103.8 (*)    All other components within normal limits  I-STAT TROPONIN, ED  I-STAT TROPONIN, ED    EKG EKG Interpretation  Date/Time:  Wednesday July 14 2018 10:14:31 EST Ventricular Rate:  51 PR Interval:    QRS Duration: 131 QT Interval:  553 QTC Calculation: 510 R Axis:   79 Text Interpretation:  Sinus rhythm IVCD, consider atypical RBBB T wave changes similar to Jul 07 2018 Confirmed by Pricilla Loveless [phone]) on 07/14/2018 10:27:10 AM    Radiology Dg Chest 2 View  Result Date: 07/14/2018 CLINICAL DATA:  Rapid heart rate today. History of CHF. Recent medication change. EXAM: CHEST - 2 VIEW COMPARISON:  PA and lateral chest x-ray of May 22, 2018 FINDINGS: The radiographs were obtained in the sitting position. The lungs are well-expanded. There is no focal infiltrate. There is no pleural effusion. The cardiac silhouette is top-normal in size. The pulmonary vascularity is not clearly engorged. IMPRESSION: No definite pneumonia, pulmonary edema, or other acute cardiopulmonary abnormality. The images are degraded due to the a sitting position. Electronically Signed   By: David  [place] M.D.   On: 07/14/2018 11:05    Procedures Procedures  (including critical care time)  Medications Ordered in ED Medications  sodium chloride 0.9 % bolus 1,000 mL (0 mLs Intravenous Stopped 07/14/18 1300)     Initial Impression / Assessment and Plan / ED Course  I have reviewed the triage vital signs and the nursing notes.  Pertinent labs & imaging results that were available during my care of the patient were reviewed by me and considered in my medical decision making (see chart for details).  Clinical Course as of Jul 15 1607  Wed Jul 14, 2018  1108 Spoke with Rosann Auerbach, Cards Master. Someone will come see the patient.   [SJ]  1108 Consistent with previous values.  Creatinine(!): 1.42 [SJ]  1350 States she feels a little better.    [SJ]    Clinical Course User Index [SJ] Irie Fiorello C, PA-C    Patient presents with feeling lightheaded and nauseous.  Found to be bradycardic, but nontoxic-appearing, not hypotensive, normal mental status, no noted functional or neurologic deficits. Chart review reveals patient has had difficulty controlling her blood pressure while maintaining adequate pulse rate.  End of shift patient care handoff report given to Ebbie Ridge, PA-C. Plan: Follow up on cardiology recommendation.  Findings and plan of care discussed with Pricilla Loveless, MD. Dr. Criss Alvine personally evaluated and examined this patient.  Vitals:   07/14/18 1004 07/14/18 1014 07/14/18 1015 07/14/18 1045  BP: (!) 155/63 (!) 161/69    Pulse: (!) 49 (!) 52  (!) 43  Resp: 20 17  10   Temp: (!) 97.5 F (36.4 C)  97.7 F (36.5 C)   TempSrc: Oral  Oral   SpO2: 98% 99%  99%      Final Clinical Impressions(s) / ED Diagnoses   Final diagnoses:  Bradycardia    ED Discharge Orders    None       Concepcion Living 07/14/18 1608    Pricilla Loveless, MD 07/19/18  1548  

## 2018-07-14 NOTE — ED Triage Notes (Signed)
Pt states around 0500 she got up at of bed and felt lightheaded and like she may pass out she walked into living room and sat down pt began feeling nauseous so she went back and laid down in bed and called sister to bring her to ED. Pt has history of afib and states she recently had a medication change. Denies any pain or sob.

## 2018-07-14 NOTE — Telephone Encounter (Signed)
Patient called in stating she woke up feeling very "sick" just dizzy and nauseous with the feeling that she was going to pass out. Pt was on her way to the ER. Pt stated her HR was 41 at home but she still took all her morning medications. Pt will be further evaluated in ER.

## 2018-07-14 NOTE — Plan of Care (Signed)
  Problem: Education: Goal: Knowledge of General Education information will improve Description Including pain rating scale, medication(s)/side effects and non-pharmacologic comfort measures Outcome: Completed/Met   Problem: Activity: Goal: Risk for activity intolerance will decrease Outcome: Completed/Met

## 2018-07-15 DIAGNOSIS — I4819 Other persistent atrial fibrillation: Secondary | ICD-10-CM | POA: Diagnosis not present

## 2018-07-15 LAB — BASIC METABOLIC PANEL
Anion gap: 9 (ref 5–15)
BUN: 14 mg/dL (ref 8–23)
CO2: 24 mmol/L (ref 22–32)
Calcium: 8.9 mg/dL (ref 8.9–10.3)
Chloride: 110 mmol/L (ref 98–111)
Creatinine, Ser: 1.26 mg/dL — ABNORMAL HIGH (ref 0.44–1.00)
GFR calc Af Amer: 48 mL/min — ABNORMAL LOW (ref >60)
GFR calc non Af Amer: 41 mL/min — ABNORMAL LOW (ref >60)
Glucose, Bld: 89 mg/dL (ref 70–99)
Potassium: 3.9 mmol/L (ref 3.5–5.1)
Sodium: 143 mmol/L (ref 135–145)

## 2018-07-15 MED ORDER — DRONEDARONE HCL 400 MG PO TABS
400.0000 mg | ORAL_TABLET | Freq: Two times a day (BID) | ORAL | Status: DC
  Filled 2018-07-15: qty 1

## 2018-07-15 MED ORDER — DRONEDARONE HCL 400 MG PO TABS: 400.0000 mg | ORAL_TABLET | Freq: Two times a day (BID) | ORAL | 6 refills | Status: DC

## 2018-07-15 MED ORDER — AMLODIPINE BESYLATE 2.5 MG PO TABS
2.5000 mg | ORAL_TABLET | Freq: Every day | ORAL | Status: DC
  Administered 2018-07-15: 2.5 mg via ORAL
  Filled 2018-07-15: qty 1

## 2018-07-15 MED ORDER — AMLODIPINE BESYLATE 2.5 MG PO TABS: 2.5000 mg | ORAL_TABLET | Freq: Every day | ORAL | 6 refills | Status: DC

## 2018-07-15 NOTE — Progress Notes (Signed)
Spoke to [facility], 30 days of Multaq will be $38.00

## 2018-07-15 NOTE — Progress Notes (Signed)
Reviewed AVS discharge instructions with patient/caregiver. Patient/caregiver verbalizes understanding of instructions received. AVS and prescriptions received by patient/caregiver. If present, telemetry box removed and central cardiac monitoring department notified of discharge. Peripheral IV removed, site benign with tip intact. Patient to be transported home by family.

## 2018-07-15 NOTE — Discharge Summary (Addendum)
DISCHARGE SUMMARY    Patient ID: Hailey [name],  MRN: [id number], DOB/AGE: [birth year] 77 y.o.  Admit date: 07/14/2018 Discharge date: 07/15/2018  Primary Care Physician: Richardean Chimera, MD  Primary Cardiologist: Dr. Ferne Reus Electrophysiologist: new to Dr. Elberta Fortis this admission  Primary Discharge Diagnosis:  1. Sinus bradyacardia 2. Paroxysmal AFib  Secondary Discharge Diagnosis:  h/o 1. HTN 2. HLD  Allergies  Allergen Reactions  . Amiodarone Itching  . Flomax [Tamsulosin Hcl] Nausea And Vomiting     Procedures This Admission:  none  Brief HPI: Hailey [name] is a 77 y.o. female is followed in the out patient setting by Dr. Purvis Sheffield and the Afib clinic. She woke the day of her admission feeling dizzy/nausea, weak and eventually near syncope (no syncope) and came to the ER was noted to have SB 51bpm, cardiology was consulted telemetry noted rates 40's-60's and she was admitted, her home lopressor stopped.   Hospital Course:  The patient's PMHx includes above.  She has h/o AFib noted last year and was placed on Amiodarone. She was doing well on this until 04/2018 when she started to have random episodes of dizziness and nausea. It was felt this was due to the Amiodarone so it was stopped and she was started on Toprol. However, she continued to have these episodes with associated hypotensive. Her beta blocker and Amlodipine were then discontinued but she continued to have these episodes. A Zio Monitor was placed on 04/29/2018 and showed persistent atrial fibrillation and atrial flutter (burden 100%) with average ventricular rate of 100 bpm (range 55 - 145 bpm). She was restarted on a low dose beta blocker on 05/20/2018 at her visit with the atrial fibrillation clinic while waiting for Amiodarone to wash out. She was last seen by the atrial fibrillation clinic on 07/07/2018 at which time patient denied any CP/palpitations; however mentioned some degree of SOB and fatigue,  she was noted to be in sinus bradycardia with heart rate of 42 bpm. Beta blocker dose was reduced.  (Note make mention that the patient was unaware symptom-wise that she had gone back to SR)  Admitted 07/14/18 as noted above. EP was consulted to the case, the patient apparently initially had wanted to avoid procedural strategies, though with recurrent symptoms, wanted to visit this. Dr. Elberta Fortis has seen and examined the patient.  Discussed AFib ablation procedure which she is interested in, though scheduling Remedios Mckone be a few weeks most likely.  She has maintained sinus through her stay, her HR 50's-60's generally and is without symptoms.  It was decided to discharge, start Multaq in the interim between now and her ablation to try and maintain SR. I have staff messaged Dr. Gershon Crane nurse to schedule her for ablation procedure.  Physical Exam: Vitals:   07/14/18 1530 07/14/18 1956 07/14/18 2153 07/15/18 0510  BP: (!) 168/82 (!) 162/67 (!) 149/78 (!) 155/79  Pulse: (!) 59 (!) 50 (!) 56 (!) 52  Resp: 15 14 16 16   Temp:   97.6 F (36.4 C) 97.7 F (36.5 C)  TempSrc:   Oral Oral  SpO2: 100% 96% 98% 99%  Weight:   89.3 kg 88.8 kg  Height:   5\' 7"  (1.702 m)      Labs:   Lab Results  Component Value Date   WBC 6.5 07/14/2018   HGB 13.6 07/14/2018   HCT 43.7 07/14/2018   MCV 103.8 (H) 07/14/2018   PLT 228 07/14/2018    Recent Labs  Lab 07/15/18  0346  NA 143  K 3.9  CL 110  CO2 24  BUN 14  CREATININE 1.26*  CALCIUM 8.9  GLUCOSE 89    Discharge Medications:  Allergies as of 07/15/2018      Reactions   Amiodarone Itching   Flomax [tamsulosin Hcl] Nausea And Vomiting      Medication List    STOP taking these medications   metoprolol tartrate 25 MG tablet Commonly known as:  LOPRESSOR     TAKE these medications   acetaminophen 650 MG CR tablet Commonly known as:  TYLENOL Take 650-1,300 mg by mouth See admin instructions. Take 1 tablets every morning and take 1 tablet at  night   amLODipine 2.5 MG tablet Commonly known as:  NORVASC Take 1 tablet (2.5 mg total) by mouth daily.   Calcium Carbonate-Vit D-Min 600-400 MG-UNIT Tabs Take 1 tablet by mouth daily.   CENTRUM SILVER tablet Take 1 tablet by mouth daily.   clonazepam 0.125 MG disintegrating tablet Commonly known as:  KLONOPIN Take 0.125 mg by mouth at bedtime.   dronedarone 400 MG tablet Commonly known as:  MULTAQ Take 1 tablet (400 mg total) by mouth 2 (two) times daily with a meal.   ELIQUIS 5 MG Tabs tablet Generic drug:  apixaban Take 5 mg by mouth 2 (two) times daily.   fluticasone 50 MCG/ACT nasal spray Commonly known as:  FLONASE Place 2 sprays into both nostrils daily as needed for allergies or rhinitis.   loratadine 10 MG tablet Commonly known as:  CLARITIN Take 10 mg by mouth daily as needed for allergies.   meclizine 25 MG tablet Commonly known as:  ANTIVERT Take 25 mg by mouth as needed for dizziness.   omeprazole 10 MG capsule Commonly known as:  PRILOSEC Take 10 mg by mouth daily.   ondansetron 4 MG tablet Commonly known as:  ZOFRAN Take 1 tablet (4 mg total) by mouth every 6 (six) hours as needed. What changed:  reasons to take this       Disposition: Home Discharge Instructions    Diet - low sodium heart healthy   Complete by:  As directed    Increase activity slowly   Complete by:  As directed      Follow-up Information    Regan Lemmingamnitz, Rolf Fells Martin, MD Follow up.   Specialty:  Cardiology Why:  You Tocara Mennen be called by Dr. Gershon Craneamnitz's nurse regarding scheduling of your ablation procedure. Contact information: [address] [place] [address] [phone]           Duration of Discharge Encounter: Greater than 30 minutes including physician time.  Signed, Francis DowseRenee Ursuy, PA-C 07/15/2018 1:35 PM  I have seen and examined this patient with Francis Dowseenee Ursuy.  Agree with above, note added to reflect my findings.  On exam, RRR, no murmurs, lungs  clear.  Patient mid to the hospital for bradycardia, weakness, and fatigue.  Her beta-blocker was held with recovery of her heart rate.  She also has history of atrial fibrillation previously treated with amiodarone.  At this point, she would prefer not to be on other medications and is agreed to ablation.  We Jayden Kratochvil schedule this as an outpatient.  We Bebe Moncure start her on Multitak today for rhythm control until her ablation.  Sawyer Mentzer M. Haidee Stogsdill MD 07/15/2018 4:21 PM

## 2018-07-15 NOTE — Care Management CC44 (Signed)
Condition Code 44 Documentation Completed  Patient Details  Name: Adriana Reamsla M Kolarik MRN: [id number] Date of Birth: 10/28/1940   Condition Code 44 given:  Yes Patient signature on Condition Code 44 notice:  Yes Documentation of 2 MD's agreement:  Yes Code 44 added to claim:  Yes    Lawerance Sabalebbie Letanya Froh, RN 07/15/2018, 2:10 PM

## 2018-07-15 NOTE — Plan of Care (Signed)
Discharged to home

## 2018-07-15 NOTE — Consult Note (Addendum)
ELECTROPHYSIOLOGY CONSULT NOTE    Patient ID: JOANNY DUPREE MRN: [id number], DOB/AGE: Nov 17, 1940 77 y.o.  Admit date: 07/14/2018 Date of Consult: @TODAY @  Primary Physician: Richardean Chimera, MD Primary Cardiologist: Dr Purvis Sheffield  Reason for Consultation: Atrial fibrillation/flutter  HPI: Patient is a 77 year old female with a history of persistent atrial fibrillation, HTN, and hyperlipidemia who is followed by Dr. Purvis Sheffield and the atrial fibrillation clinic. Patient had been maintained on Amiodarone and was doing well on this until 04/2018 when she started to have episodes of dizziness and nausea. It was felt this was due to the Amiodarone so it was stopped and she was started on Toprol. However, she continued to have these episodes with associated hypotension. Her beta blocker and Amlodipine were then discontinued but she continued to have these episodes. A Zio Monitor was placed on 04/29/2018 and showed persistent atrial fibrillation and atrial flutter (burden 100%) with average ventricular rate of 100 bpm (range 55 - 145 bpm) with no significant bradycardia, pauses or AV block. She was restarted on a low dose beta blocker on 05/20/2018 at her visit with the atrial fibrillation clinic while waiting for amiodarone to wash out. She was last seen by the atrial fibrillation clinic on 07/07/2018 at which time patient denied any cardiac symptoms; however, she was noted to be in sinus bradycardia with heart rate of 42 bpm. Beta blocker dose was reduced. She is currently taking Lopressor 25mg  in the morning and 12.5mg  in the evening. Still waiting on Amiodarone wash out. Last level checked on 07/07/18 was 0.6.  She presented to the ED with another episode of dizziness and nausea upon waking in the morning. She denies any syncope or falls. Today, she reports that her symptoms of nausea and presyncope have resolved. She is in sinus rhythm HR 50s at the time of my visit.   Past Medical History:    Diagnosis Date  . Atrial fibrillation (HCC)   . Breast nodule 11/16/2013  . H/O hematuria   . HLD (hyperlipidemia)   . Hypertension   . 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 Migraine headache   . Palpitations   . Restless leg syndrome   . Vaginal atrophy      Surgical History:  Past Surgical History:  Procedure Laterality Date  . APPENDECTOMY    . CARDIAC CATHETERIZATION  2005    cone  . COLONOSCOPY    . HERNIA REPAIR    . LEFT HEART CATHETERIZATION WITH CORONARY ANGIOGRAM N/A 04/12/2012   Procedure: LEFT HEART CATHETERIZATION WITH CORONARY ANGIOGRAM;  Surgeon: Kathleene Hazel, MD;  Location: [facility] C[facility] LAB;  Service: Cardiovascular;  Laterality: N/A;  . TOTAL ABDOMINAL HYSTERECTOMY       Medications Prior to Admission  Medication Sig Dispense Refill Last Dose  . acetaminophen (TYLENOL) 650 MG CR tablet Take 650-1,300 mg by mouth See admin instructions. Take 1 tablets every morning and take 1 tablet at night   07/14/2018 at Unknown time  . apixaban (ELIQUIS) 5 MG TABS tablet Take 5 mg by mouth 2 (two) times daily.   07/14/2018 at 0600  . Calcium Carbonate-Vit D-Min 600-400 MG-UNIT TABS Take 1 tablet by mouth daily.    07/14/2018 at Unknown time  . clonazepam (KLONOPIN) 0.125 MG disintegrating tablet Take 0.125 mg by mouth at bedtime.  5 07/13/2018 at Unknown time  . metoprolol tartrate (LOPRESSOR) 25 MG tablet Take 1 tablet in the AM and 1/2 tablet in the PM (  Patient taking differently: 12.5-25 mg. Take 1 tablet in the AM and 1/2 tablet in the PM) 60 tablet 3 07/14/2018 at 0600  . Multiple Vitamins-Minerals (CENTRUM SILVER) tablet Take 1 tablet by mouth daily.     07/13/2018 at Unknown time  . omeprazole (PRILOSEC) 10 MG capsule Take 10 mg by mouth daily.   Past Week at Unknown time  . fluticasone (FLONASE) 50 MCG/ACT nasal spray Place 2 sprays into both nostrils daily as needed for allergies or rhinitis.   UNK  . loratadine (CLARITIN) 10 MG tablet Take 10 mg by mouth  daily as needed for allergies.   UNK  . meclizine (ANTIVERT) 25 MG tablet Take 25 mg by mouth as needed for dizziness.   UNK  . ondansetron (ZOFRAN) 4 MG tablet Take 1 tablet (4 mg total) by mouth every 6 (six) hours as needed. (Patient taking differently: Take 4 mg by mouth every 6 (six) hours as needed for nausea or vomiting. ) 20 tablet 0 UNK    Inpatient Medications:  . apixaban  5 mg Oral BID  . calcium-vitamin D  1 tablet Oral Q breakfast  . clonazepam  0.125 mg Oral QHS    Allergies:  Allergies  Allergen Reactions  . Amiodarone Itching  . Flomax [Tamsulosin Hcl] Nausea And Vomiting    Social History   Socioeconomic History  . Marital status: Single    Spouse name: Not on file  . Number of children: 0  . Years of education: College  . Highest education level: Not on file  Occupational History  . Occupation: retired  Engineer, production  . Financial resource strain: Not on file  . Food insecurity:    Worry: Not on file    Inability: Not on file  . Transportation needs:    Medical: Not on file    Non-medical: Not on file  Tobacco Use  . Smoking status: Never Smoker  . Smokeless tobacco: Never Used  Substance and Sexual Activity  . Alcohol use: Yes    Comment: rarely; glass of wine with dinner  . Drug use: No  . Sexual activity: Never    Birth control/protection: Surgical    Comment: hyst  Lifestyle  . Physical activity:    Days per week: Not on file    Minutes per session: Not on file  . Stress: Not on file  Relationships  . Social connections:    Talks on phone: Not on file    Gets together: Not on file    Attends religious service: Not on file    Active member of club or organization: Not on file    Attends meetings of clubs or organizations: Not on file    Relationship status: Not on file  . Intimate partner violence:    Fear of current or ex partner: Not on file    Emotionally abused: Not on file    Physically abused: Not on file    Forced sexual  activity: Not on file  Other Topics Concern  . Not on file  Social History Narrative   Single. Retired.    Patient lives at home alone.   Caffeine Use: none     Family History  Problem Relation Age of Onset  . Coronary artery disease Father        mid 62s  . Hypertension Mother   . Stroke Brother   . Transient ischemic attack Brother   . Other Brother        on oxygen  .  Hypertension Brother   . Stroke Brother   . Heart disease Brother   . Heart attack Brother   . Other Sister        a fib, restless leg  . Other Sister        pressure in eyes     Review of Systems: General: No chills, fever, night sweats or weight changes  Cardiovascular:  No chest pain, dyspnea on exertion, edema, orthopnea, palpitations, paroxysmal nocturnal dyspnea Dermatological: No rash, lesions or masses Respiratory: No cough, dyspnea Urologic: No hematuria, dysuria Abdominal: No nausea, vomiting Neurologic: No visual changes, weakness, changes in mental status All other systems reviewed and are otherwise negative except as noted above.  Physical Exam: Vitals:   07/14/18 1530 07/14/18 1956 07/14/18 2153 07/15/18 0510  BP: (!) 168/82 (!) 162/67 (!) 149/78 (!) 155/79  Pulse: (!) 59 (!) 50 (!) 56 (!) 52  Resp: 15 14 16 16   Temp:   97.6 F (36.4 C) 97.7 F (36.5 C)  TempSrc:   Oral Oral  SpO2: 100% 96% 98% 99%  Weight:   89.3 kg 88.8 kg  Height:   5\' 7"  (1.702 m)     GEN- The patient is well appearing elderly female, alert and oriented x 3 today.   HEENT: normocephalic, atraumatic; sclera clear, conjunctiva pink; hearing intact; oropharynx clear; neck supple Lungs- Clear to ausculation bilaterally, normal work of breathing.  No wheezes, rales, rhonchi Heart- Regular rate and rhythm, no murmurs, rubs or gallops, PMI not laterally displaced GI- soft, non-tender, non-distended, bowel sounds present Extremities- no clubbing, cyanosis, or edema MS- no significant deformity or atrophy Skin-  warm and dry, no rash or lesion Psych- euthymic mood, full affect Neuro- no gross deficits observed  Labs:   Lab Results  Component Value Date   WBC 6.5 07/14/2018   HGB 13.6 07/14/2018   HCT 43.7 07/14/2018   MCV 103.8 (H) 07/14/2018   PLT 228 07/14/2018    Recent Labs  Lab 07/15/18 0346  NA 143  K 3.9  CL 110  CO2 24  BUN 14  CREATININE 1.26*  CALCIUM 8.9  GLUCOSE 89   Amiodarone level 0.6 on 07/07/18   Radiology/Studies: Dg Chest 2 View  Result Date: 07/14/2018 CLINICAL DATA:  Rapid heart rate today. History of CHF. Recent medication change. EXAM: CHEST - 2 VIEW COMPARISON:  PA and lateral chest x-ray of May 22, 2018 FINDINGS: The radiographs were obtained in the sitting position. The lungs are well-expanded. There is no focal infiltrate. There is no pleural effusion. The cardiac silhouette is top-normal in size. The pulmonary vascularity is not clearly engorged. IMPRESSION: No definite pneumonia, pulmonary edema, or other acute cardiopulmonary abnormality. The images are degraded due to the a sitting position. Electronically Signed   By: David  SwazilandJordan M.D.   On: 07/14/2018 11:05   Echo 05/20/17  - Left ventricle: The cavity size was normal. Wall thickness was   normal. Systolic function was normal. The estimated ejection   fraction was in the range of 60% to 65%. Wall motion was normal;   there were no regional wall motion abnormalities. Left   ventricular diastolic function parameters were normal. - Aortic valve: Mildly to moderately calcified annulus. Trileaflet.   There was mild regurgitation. - Mitral valve: Calcified annulus. Normal thickness leaflets .   There was moderate regurgitation. - Tricuspid valve: There was mild regurgitation. - Pulmonary arteries: PA peak pressure: 34 mm Hg (S). -LA normal size  EKG: sinus bradycardia HR  58, inc RBBB, QTc 472 by manual calculation  TELEMETRY: sinus bradycardia 40's-50's. No significant pauses  Assessment and  Plan:   1. Persistent atrial fibrillation/flutter She is in sinus rhythm today off AAD. Her symptoms of nausea/dizziness do not seem to correlate to her arrhythmia. Possibly sinus node dysfunction with tachy/brady although no significant bradycardia or pauses noted on her telemetry or outpatient Zio monitor. We discussed therapeutic options including dofetilide, afib/flutter ablation, and PPM.  Would need to wait until amiodarone washout complete before starting dofetilide, may not be a good candidate given QTc >440 in sinus. Kalan Rinn discuss ablation with Dr Elberta Fortis. Camy Leder start Multaq 400mg  BID  2. Bradycardia No significant pauses or AV block seen on telemetry. Did have some HR in the 40's this AM but was asymptomatic. Currently, HR upper 50s-60s. Alixandra Alfieri stop BB  3. HTN Her metoprolol and amlodipine were stopped as an outpatient due to possible hypotensive/bradycardic.  Episodes. She has been hypertensive since admission. BP 155/79 this AM Lyndsey Demos resume amlodipine at lower 2.5mg  daily dose.  She is stable for DC today. Ediel Unangst get her set up as an outpatient for afib ablation.  Signed, Jorja Loa, PA-C 07/15/2018 9:24 AM   I have seen and examined this patient with Francis Dowse.  Agree with above, note added to reflect my findings.  On exam, RRR, no murmurs, lungs clear.  Patient presented to hospital initially with weakness and fatigue, found to be mildly bradycardic.  She has a history of atrial fibrillation and has been on metoprolol for rate control.  Currently she feels well with heart rates in the 50s to low 60s.  She had previously been on amiodarone but it was stopped.  We Jaquae Rieves plan for ablation for her atrial fibrillation.  She would prefer this instead of other medications.  We Lipa Knauff call her back with recommendations and a date.  Jager Koska M. Jerrianne Hartin MD 07/15/2018 4:20 PM

## 2018-07-15 NOTE — Care Management Obs Status (Signed)
MEDICARE OBSERVATION STATUS NOTIFICATION   Patient Details  Name: Hailey [name] MRN: [id number] Date of Birth: 06/25/1941   Medicare Observation Status Notification Given:  Yes    Lawerance Sabalebbie Amarri Satterly, RN 07/15/2018, 2:09 PM

## 2018-07-26 ENCOUNTER — Encounter (HOSPITAL_COMMUNITY): Payer: Self-pay | Admitting: *Deleted

## 2018-07-26 ENCOUNTER — Emergency Department (HOSPITAL_COMMUNITY)
Admission: EM | Admit: 2018-07-26 | Discharge: 2018-07-27 | Disposition: A | Discharge status: Home / Self Care | Payer: Medicare Other | Attending: Emergency Medicine | Admitting: Emergency Medicine

## 2018-07-26 ENCOUNTER — Emergency Department (HOSPITAL_COMMUNITY): Payer: Medicare Other

## 2018-07-26 ENCOUNTER — Other Ambulatory Visit: Payer: Self-pay

## 2018-07-26 DIAGNOSIS — I10 Essential (primary) hypertension: Secondary | ICD-10-CM | POA: Diagnosis not present

## 2018-07-26 DIAGNOSIS — R0789 Other chest pain: Secondary | ICD-10-CM | POA: Diagnosis present

## 2018-07-26 DIAGNOSIS — Z7901 Long term (current) use of anticoagulants: Secondary | ICD-10-CM | POA: Diagnosis not present

## 2018-07-26 DIAGNOSIS — N179 Acute kidney failure, unspecified: Secondary | ICD-10-CM | POA: Insufficient documentation

## 2018-07-26 DIAGNOSIS — I4891 Unspecified atrial fibrillation: Secondary | ICD-10-CM | POA: Insufficient documentation

## 2018-07-26 LAB — BASIC METABOLIC PANEL
Anion gap: 10 (ref 5–15)
BUN: 15 mg/dL (ref 8–23)
CHLORIDE: 105 mmol/L (ref 98–111)
CO2: 26 mmol/L (ref 22–32)
Calcium: 9.4 mg/dL (ref 8.9–10.3)
Creatinine, Ser: 2.38 mg/dL — ABNORMAL HIGH (ref 0.44–1.00)
GFR calc Af Amer: 22 mL/min — ABNORMAL LOW (ref >60)
GFR calc non Af Amer: 19 mL/min — ABNORMAL LOW (ref >60)
Glucose, Bld: 113 mg/dL — ABNORMAL HIGH (ref 70–99)
Potassium: 5 mmol/L (ref 3.5–5.1)
SODIUM: 141 mmol/L (ref 135–145)

## 2018-07-26 LAB — TROPONIN I: Troponin I: <0.03 ng/mL (ref <0.03)

## 2018-07-26 LAB — CBC
HCT: 40.7 % (ref 36.0–46.0)
Hemoglobin: 12.9 g/dL (ref 12.0–15.0)
MCH: 33 pg (ref 26.0–34.0)
MCHC: 31.7 g/dL (ref 30.0–36.0)
MCV: 104.1 fL — ABNORMAL HIGH (ref 80.0–100.0)
Platelets: 247 10*3/uL (ref 150–400)
RBC: 3.91 MIL/uL (ref 3.87–5.11)
RDW: 13.5 % (ref 11.5–15.5)
WBC: 5.9 10*3/uL (ref 4.0–10.5)
nRBC: 0 % (ref 0.0–0.2)

## 2018-07-26 NOTE — ED Triage Notes (Signed)
The pt has had chest pain for 2 weeks  Hx of af scheduled for an abalatan in January. She had new medicine given 2 weeks meltaq   Since then she has had thus chest pain

## 2018-07-27 ENCOUNTER — Other Ambulatory Visit: Payer: Self-pay

## 2018-07-27 LAB — I-STAT TROPONIN, ED: Troponin i, poc: 0.01 ng/mL (ref 0.00–0.08)

## 2018-07-27 MED ORDER — HYDROCODONE-ACETAMINOPHEN 5-325 MG PO TABS
1.0000 | ORAL_TABLET | Freq: Once | ORAL | Status: AC
  Administered 2018-07-27: 1 via ORAL
  Filled 2018-07-27: qty 1

## 2018-07-27 MED ORDER — OMEPRAZOLE 20 MG PO CPDR: 20.0000 mg | DELAYED_RELEASE_CAPSULE | Freq: Every day | ORAL | 0 refills | Status: DC

## 2018-07-27 MED ORDER — HYDROCODONE-ACETAMINOPHEN 5-325 MG PO TABS: 1.0000 | ORAL_TABLET | Freq: Four times a day (QID) | ORAL | 0 refills | Status: DC | PRN

## 2018-07-27 MED ORDER — SODIUM CHLORIDE 0.9 % IV BOLUS
1000.0000 mL | Freq: Once | INTRAVENOUS | Status: AC
  Administered 2018-07-27: 1000 mL via INTRAVENOUS

## 2018-07-27 NOTE — Discharge Instructions (Addendum)
Seen today for chest pain.  Optimize your reflux medications.  Follow-up closely with your primary physician for recheck of your kidney function and follow-up closely with cardiology given medication concerns and adjustments.

## 2018-07-27 NOTE — ED Provider Notes (Signed)
[facility] EMERGENCY DEPARTMENT Provider Note   CSN: [id number] Arrival date & time: 07/26/18  1959     History   Chief Complaint Chief Complaint  Patient presents with  . Chest Pain    HPI Hailey [name] is a 77 y.o. female.  HPI  This is a 77 year old female with a history of atrial fibrillation, hypertension, migraines who presents with chest pain.  Patient reports onset of chest pain 2 weeks ago after starting Multaq.  Reports chest pain is intermittent.  It is achy in nature and over the left side of her chest.  Initially she thought it was related to reflux.  She increased her omeprazole and Tums.  She thinks she may have had some improvement but continues to have pain.  She has not called cardiology.  She states prior to chest pain she had had multiple changes in medications.  She was initially on amlodipine and was being transitioned to [employer].  She was started on metoprolol but did not tolerate metoprolol secondary to bradycardia.  This is when she was started on the Multaq.  Since that time she reports that she has had worsening chest pain.  It is not exertional.  She denies any nausea, shortness of breath, fevers, cough.  Past Medical History:  Diagnosis Date  . Atrial fibrillation (HCC)   . Breast nodule 11/16/2013  . H/O hematuria   . HLD (hyperlipidemia)   . Hypertension   . 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 Migraine headache   . Palpitations   . Restless leg syndrome   . Vaginal atrophy     Patient Active Problem List   Diagnosis Date Noted  . Atrial fibrillation (HCC) 07/14/2018  . Vaginal dryness 01/28/2017  . Vaginal atrophy 01/28/2017  . Breast nodule 11/16/2013  . Breast mass, right 12/22/2012  . OVERWEIGHT 04/13/2009  . GENERALIZED ANXIETY DISORDER 04/13/2009  . CORONARY ARTERY DISEASE 04/13/2009  . PALPITATIONS 04/03/2009    Past Surgical History:  Procedure Laterality Date  . APPENDECTOMY    . CARDIAC  CATHETERIZATION  2005    cone  . COLONOSCOPY    . HERNIA REPAIR    . LEFT HEART CATHETERIZATION WITH CORONARY ANGIOGRAM N/A 04/12/2012   Procedure: LEFT HEART CATHETERIZATION WITH CORONARY ANGIOGRAM;  Surgeon: Kathleene Hazel, MD;  Location: [facility] CATH LAB;  Service: Cardiovascular;  Laterality: N/A;  . TOTAL ABDOMINAL HYSTERECTOMY       OB History    Gravida  1   Para      Term      Preterm      AB  1   Living  0     SAB  1   TAB      Ectopic      Multiple      Live Births               Home Medications    Prior to Admission medications   Medication Sig Start Date End Date Taking? Authorizing Provider  acetaminophen (TYLENOL) 650 MG CR tablet Take 650-1,300 mg by mouth See admin instructions. Take 1 tablets every morning and take 1 tablet at night   Yes [provider]  amLODipine (NORVASC) 2.5 MG tablet Take 1 tablet (2.5 mg total) by mouth daily. 07/15/18  Yes Sheilah Pigeon, PA-C  apixaban (ELIQUIS) 5 MG TABS tablet Take 5 mg by mouth 2 (two) times daily.   Yes [provider]  Calcium Carbonate-Vit D-Min 600-400 MG-UNIT TABS Take 1 tablet by mouth daily.    Yes [provider]  clonazepam (KLONOPIN) 0.125 MG disintegrating tablet Take 0.125 mg by mouth at bedtime. 01/12/15  Yes [provider]  dronedarone (MULTAQ) 400 MG tablet Take 1 tablet (400 mg total) by mouth 2 (two) times daily with a meal. 07/15/18  Yes Sheilah PigeonUrsuy, Renee Lynn, PA-C  fluticasone [employer](FLONASE) 50 MCG/ACT nasal spray Place 2 sprays into both nostrils daily as needed for allergies or rhinitis.   Yes [provider]  loratadine (CLARITIN) 10 MG tablet Take 10 mg by mouth daily as needed for allergies.   Yes [provider]  meclizine (ANTIVERT) 25 MG tablet Take 25 mg by mouth as needed for dizziness.   Yes [provider]  Multiple Vitamins-Minerals (CENTRUM SILVER) tablet Take 1 tablet by mouth daily.     Yes [provider]  ondansetron (ZOFRAN) 4 MG tablet Take 1 tablet (4 mg total) by mouth every 6 (six) hours as needed. Patient taking differently: Take 4 mg by mouth every 6 (six) hours as needed for nausea or vomiting.  04/24/18  Yes Dione BoozeGlick, David, MD  HYDROcodone-acetaminophen (NORCO/VICODIN) 5-325 MG tablet Take 1 tablet by mouth every 6 (six) hours as needed. 07/27/18   , Mayer Maskerourtney F, MD  omeprazole (PRILOSEC) 20 MG capsule Take 1 capsule (20 mg total) by mouth daily. 07/27/18   , Mayer Maskerourtney F, MD    Family History Family History  Problem Relation Age of Onset  . Coronary artery disease Father        mid 7750s  . Hypertension Mother   . Stroke Brother   . Transient ischemic attack Brother   . Other Brother        on oxygen  . Hypertension Brother   . Stroke Brother   . Heart disease Brother   . Heart attack Brother   . Other Sister        a fib, restless leg  . Other Sister        pressure in eyes    Social History Social History   Tobacco Use  . Smoking status: Never Smoker  . Smokeless tobacco: Never Used  Substance Use Topics  . Alcohol use: Yes    Comment: rarely; glass of wine with dinner  . Drug use: No     Allergies   Amiodarone and Flomax [tamsulosin hcl]   Review of Systems Review of Systems  Constitutional: Negative for fever.  Respiratory: Negative for cough, chest tightness and shortness of breath.   Cardiovascular: Positive for chest pain. Negative for leg swelling.  Gastrointestinal: Negative for abdominal pain, nausea and vomiting.  Genitourinary: Negative for dysuria.  All other systems reviewed and are negative.    Physical Exam Updated Vital Signs BP (!) 147/58   Pulse 66   Temp 97.8 F (36.6 C) (Oral)   Resp 10   Ht 1.702 m (5\' 7" )   Wt 86.2 kg   SpO2 100%   BMI 29.76 kg/m   Physical Exam Vitals signs and nursing note reviewed.  Constitutional:      Appearance: She is well-developed.  HENT:     Head: Normocephalic and atraumatic.    Neck:     Musculoskeletal: Neck supple.  Cardiovascular:     Rate and Rhythm: Regular rhythm. Bradycardia present.     Heart sounds: Normal heart sounds. No murmur.  Pulmonary:     Effort: Pulmonary effort is normal. No respiratory  distress.     Breath sounds: Normal breath sounds. No wheezing.  Abdominal:     General: Bowel sounds are normal.     Palpations: Abdomen is soft.  Musculoskeletal:     Right lower leg: No edema.     Left lower leg: No edema.  Skin:    General: Skin is warm and dry.  Neurological:     Mental Status: She is alert and oriented to person, place, and time.  Psychiatric:        Mood and Affect: Mood normal.      ED Treatments / Results  Labs (all labs ordered are listed, but only abnormal results are displayed) Labs Reviewed  BASIC METABOLIC PANEL - Abnormal; Notable for the following components:      Result Value   Glucose, Bld 113 (*)    Creatinine, Ser 2.38 (*)    GFR calc non Af Amer 19 (*)    GFR calc Af Amer 22 (*)    All other components within normal limits  CBC - Abnormal; Notable for the following components:   MCV 104.1 (*)    All other components within normal limits  TROPONIN I  I-STAT TROPONIN, ED    EKG EKG Interpretation  Date/Time:  Monday July 26 2018 20:06:02 EST Ventricular Rate:  54 PR Interval:  180 QRS Duration: 106 QT Interval:  488 QTC Calculation: 462 R Axis:   81 Text Interpretation:  Sinus bradycardia Low voltage QRS Incomplete right bundle branch block No significant change since last tracing Abnormal ECG Confirmed by Ross MarcusHorton,  ([id number]) on 07/27/2018 1:54:49 AM   Radiology Dg Chest 2 View  Result Date: 07/26/2018 CLINICAL DATA:  Left-sided chest pain radiating into the back for 2 weeks EXAM: CHEST - 2 VIEW COMPARISON:  07/14/2018 FINDINGS: Cardiac shadow is mildly prominent but stable. The lungs are well aerated bilaterally. No focal infiltrate or effusion is seen. No acute bony abnormality is  noted. IMPRESSION: No acute abnormality noted. Electronically Signed   By: Alcide CleverMark  Lukens M.D.   On: 07/26/2018 20:48    Procedures Procedures (including critical care time)  Medications Ordered in ED Medications  sodium chloride 0.9 % bolus 1,000 mL (1,000 mLs Intravenous New Bag/Given 07/27/18 0330)  HYDROcodone-acetaminophen (NORCO/VICODIN) 5-325 MG per tablet 1 tablet (1 tablet Oral Given 07/27/18 0329)     Initial Impression / Assessment and Plan / ED Course  I have reviewed the triage vital signs and the nursing notes.  Pertinent labs & imaging results that were available during my care of the patient were reviewed by me and considered in my medical decision making (see chart for details).     Patient presents with chest pain.  Ongoing and intermittent for the last 2 weeks after starting a new medication.  She is overall nontoxic and vital signs are reassuring.  Lab work reviewed.  Initial troponin is negative.  EKG shows no evidence of ischemia.  She is in sinus bradycardia.  She does have evidence of acute kidney injury with creatinine greater than 2.  Baseline is around 1.2.  She reports good oral intake.  She was given fluids and Norco.  Repeat troponin remains negative.  I did discuss with the cardiology fellow as patient states that pain seems to correspond with starting Multaq.  He states that it can have some GI symptoms.  He recommends reassurance, optimizing reflux medications and close cardiology follow-up.  I discussed this with the patient.  She will also need follow-up for  creatinine recheck.  Patient was reassured.  She is requesting something for pain.  We will increase her omeprazole to 20 mg daily.  She was given a short course of Norco for ongoing discomfort as well.  After history, exam, and medical workup I feel the patient has been appropriately medically screened and is safe for discharge home. Pertinent diagnoses were discussed with the patient. Patient was given  return precautions.   Final Clinical Impressions(s) / ED Diagnoses   Final diagnoses:  Atypical chest pain  AKI (acute kidney injury) [facility])    ED Discharge Orders         Ordered    omeprazole (PRILOSEC) 20 MG capsule  Daily     07/27/18 0411    HYDROcodone-acetaminophen (NORCO/VICODIN) 5-325 MG tablet  Every 6 hours PRN     07/27/18 0411           Shon Baton, MD 07/27/18 0505

## 2018-07-30 ENCOUNTER — Telehealth: Payer: Self-pay | Admitting: *Deleted

## 2018-07-30 DIAGNOSIS — I4819 Other persistent atrial fibrillation: Secondary | ICD-10-CM

## 2018-07-30 NOTE — Telephone Encounter (Signed)
-----   Message from Will Jorja LoaMartin Camnitz, MD sent at 07/15/2018  4:23 PM EST ----- [place]?

## 2018-07-30 NOTE — Telephone Encounter (Signed)
No answer no voice mail  

## 2018-08-02 ENCOUNTER — Ambulatory Visit: Payer: Medicare Other | Admitting: Cardiovascular Disease

## 2018-08-02 ENCOUNTER — Encounter: Payer: Self-pay | Admitting: Cardiovascular Disease

## 2018-08-02 ENCOUNTER — Encounter: Payer: Self-pay | Admitting: *Deleted

## 2018-08-02 VITALS — BP 110/65 | HR 64 | Ht 67.0 in | Wt 199.0 lb

## 2018-08-02 DIAGNOSIS — Z9289 Personal history of other medical treatment: Secondary | ICD-10-CM | POA: Diagnosis not present

## 2018-08-02 DIAGNOSIS — R079 Chest pain, unspecified: Secondary | ICD-10-CM | POA: Diagnosis not present

## 2018-08-02 DIAGNOSIS — I4819 Other persistent atrial fibrillation: Secondary | ICD-10-CM

## 2018-08-02 DIAGNOSIS — I10 Essential (primary) hypertension: Secondary | ICD-10-CM

## 2018-08-02 NOTE — Progress Notes (Signed)
SUBJECTIVE: The patient presents for follow-up after being evaluated in the ED for chest pain recently.  She was previously evaluated by EP earlier this month and plans to undergo an atrial fibrillation ablation.  She was also started on Multaq.  Nuclear stress test was low risk on 07/03/17, LVEF 80%.  Coronary angiography on 04/12/12 demonstrated very minor nonobstructive coronary artery disease with normal left ventricular function. There was 10-20% mid vessel irregularities in the LAD, 20% proximal left circumflex stenosis, and LVEF 55-65%.  This was performed by Dr. SwazilandJordan.  She has a history of a hiatal hernia as well.  In the ED she had some renal insufficiency and was given IV fluids.  She was also given Norco.  Troponins were negative.  Her case was discussed with the cardiology fellow on-call and it was felt that symptoms were related to institution of dronedarone.  PPI dose was increased.  Chest x-ray showed no acute abnormalities.  Creatinine was elevated at 2.38 and had been 1.26.  CBC showed normal WBC count and hemoglobin.  ECG which I personally reviewed showed sinus bradycardia and incomplete right bundle branch block.  She has been feeling well for the past few days.  She takes Tylenol for chest pain.  She has occasional palpitations at night.  She denies exertional chest pain and dyspnea.  She is a bit worried about the ablation but plans to proceed with it.   Review of Systems: As per "subjective", otherwise negative.  Allergies  Allergen Reactions  . Amiodarone Itching  . Flomax [Tamsulosin Hcl] Nausea And Vomiting    Current Outpatient Medications  Medication Sig Dispense Refill  . acetaminophen (TYLENOL) 650 MG CR tablet Take 650-1,300 mg by mouth See admin instructions. Take 1 tablets every morning and take 1 tablet at night    . amLODipine (NORVASC) 2.5 MG tablet Take 1 tablet (2.5 mg total) by mouth daily. 30 tablet 6  . apixaban (ELIQUIS) 5 MG  TABS tablet Take 5 mg by mouth 2 (two) times daily.    . Calcium Carbonate-Vit D-Min 600-400 MG-UNIT TABS Take 1 tablet by mouth daily.     . clonazepam (KLONOPIN) 0.125 MG disintegrating tablet Take 0.125 mg by mouth at bedtime.  5  . dronedarone (MULTAQ) 400 MG tablet Take 1 tablet (400 mg total) by mouth 2 (two) times daily with a meal. 60 tablet 6  . fluticasone (FLONASE) 50 MCG/ACT nasal spray Place 2 sprays into both nostrils daily as needed for allergies or rhinitis.    Marland Kitchen. HYDROcodone-acetaminophen (NORCO/VICODIN) 5-325 MG tablet Take 1 tablet by mouth every 6 (six) hours as needed. 6 tablet 0  . loratadine (CLARITIN) 10 MG tablet Take 10 mg by mouth daily as needed for allergies.    Marland Kitchen. meclizine (ANTIVERT) 25 MG tablet Take 25 mg by mouth as needed for dizziness.    . Multiple Vitamins-Minerals (CENTRUM SILVER) tablet Take 1 tablet by mouth daily.      Marland Kitchen. omeprazole (PRILOSEC) 20 MG capsule Take 1 capsule (20 mg total) by mouth daily. 30 capsule 0  . ondansetron (ZOFRAN) 4 MG tablet Take 1 tablet (4 mg total) by mouth every 6 (six) hours as needed. (Patient taking differently: Take 4 mg by mouth every 6 (six) hours as needed for nausea or vomiting. ) 20 tablet 0   No current facility-administered medications for this visit.     Past Medical History:  Diagnosis Date  . Atrial fibrillation (HCC)   . Breast  nodule 11/16/2013  . H/O hematuria   . HLD (hyperlipidemia)   . Hypertension   . 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 Migraine headache   . Palpitations   . Restless leg syndrome   . Vaginal atrophy     Past Surgical History:  Procedure Laterality Date  . APPENDECTOMY    . CARDIAC CATHETERIZATION  2005    cone  . COLONOSCOPY    . HERNIA REPAIR    . LEFT HEART CATHETERIZATION WITH CORONARY ANGIOGRAM N/A 04/12/2012   Procedure: LEFT HEART CATHETERIZATION WITH CORONARY ANGIOGRAM;  Surgeon: Kathleene Hazel, MD;  Location: [facility] CATH LAB;  Service: Cardiovascular;   Laterality: N/A;  . TOTAL ABDOMINAL HYSTERECTOMY      Social History   Socioeconomic History  . Marital status: Single    Spouse name: Not on file  . Number of children: 0  . Years of education: College  . Highest education level: Not on file  Occupational History  . Occupation: retired  Engineer, production  . Financial resource strain: Not on file  . Food insecurity:    Worry: Not on file    Inability: Not on file  . Transportation needs:    Medical: Not on file    Non-medical: Not on file  Tobacco Use  . Smoking status: Never Smoker  . Smokeless tobacco: Never Used  Substance and Sexual Activity  . Alcohol use: Yes    Comment: rarely; glass of wine with dinner  . Drug use: No  . Sexual activity: Never    Birth control/protection: Surgical    Comment: hyst  Lifestyle  . Physical activity:    Days per week: Not on file    Minutes per session: Not on file  . Stress: Not on file  Relationships  . Social connections:    Talks on phone: Not on file    Gets together: Not on file    Attends religious service: Not on file    Active member of club or organization: Not on file    Attends meetings of clubs or organizations: Not on file    Relationship status: Not on file  . Intimate partner violence:    Fear of current or ex partner: Not on file    Emotionally abused: Not on file    Physically abused: Not on file    Forced sexual activity: Not on file  Other Topics Concern  . Not on file  Social History Narrative   Single. Retired.    Patient lives at home alone.   Caffeine Use: none     Vitals:   08/02/18 1353  BP: 110/65  Pulse: 64  SpO2: 98%  Weight: 199 lb (90.3 kg)  Height: 5\' 7"  (1.702 m)    Wt Readings from Last 3 Encounters:  08/02/18 199 lb (90.3 kg)  07/26/18 190 lb (86.2 kg)  07/15/18 195 lb 12.8 oz (88.8 kg)     PHYSICAL EXAM General: NAD HEENT: Normal. Neck: No JVD, no thyromegaly. Lungs: Clear to auscultation bilaterally with normal  respiratory effort. CV: Regular rate and rhythm, normal S1/S2, no S3/S4, no murmur. No pretibial or periankle edema.     Abdomen: Soft, nontender, no distention.  Neurologic: Alert and oriented.  Psych: Normal affect. Skin: Normal. Musculoskeletal: No gross deformities.    ECG: Reviewed above under Subjective   Labs: Lab Results  Component Value Date/Time   K 5.0 07/26/2018 08:20 PM   BUN 15 07/26/2018 08:20 PM  BUN 16 10/21/2013 01:10 PM   CREATININE 2.38 (H) 07/26/2018 08:20 PM   ALT 21 04/24/2018 06:13 AM   TSH 5.486 (H) 05/22/2018 05:44 PM   TSH 3.010 10/21/2013 01:10 PM   HGB 12.9 07/26/2018 08:20 PM     Lipids: No results found for: LDLCALC, LDLDIRECT, CHOL, TRIG, HDL     ASSESSMENT AND PLAN: 1.  Persistent atrial fibrillation: Currently on dronedarone with plans for ablation in the near future.  Currently in a regular rhythm.  Continue Eliquis for anticoagulation.  Symptomatically stable.  2.  Chest pain: Does not appear to be cardiac in etiology.  Likely due to dronedarone and hiatal hernia.  She underwent a low risk nuclear stress test in November 2018.  3.  Hypertension: Controlled on present therapy.  No changes.    Disposition: Follow up with Dr. Elberta Fortisamnitz as scheduled.  Follow-up with me as needed.   Prentice DockerSuresh Alayziah Tangeman, M.D., F.A.C.C.

## 2018-08-02 NOTE — Telephone Encounter (Signed)
Pt returned my call.   She would like ablation to be scheduled for 08/20/18. H&P appt scheduled for 1/9 w/ Camnitz. She understands office will call her to schedule CT testing w/i 7 days PRIOR to procedure. Briefly reviewed CT & procedure instructions and advised that we would discuss in more detail at next weeks OV.  She understands that she will receive instruction letters that day. Patient verbalized understanding and agreeable to plan.

## 2018-08-02 NOTE — Patient Instructions (Signed)
Medication Instructions:  Continue all current medications.  Labwork: none  Testing/Procedures: none  Follow-Up: As needed.    Any Other Special Instructions Will Be Listed Below (If Applicable).  If you need a refill on your cardiac medications before your next appointment, please call your pharmacy.  

## 2018-08-12 ENCOUNTER — Ambulatory Visit: Payer: Medicare Other | Admitting: Cardiology

## 2018-08-12 ENCOUNTER — Encounter: Payer: Self-pay | Admitting: Cardiology

## 2018-08-12 VITALS — BP 122/62 | HR 62 | Ht 67.0 in | Wt 198.0 lb

## 2018-08-12 DIAGNOSIS — I10 Essential (primary) hypertension: Secondary | ICD-10-CM

## 2018-08-12 DIAGNOSIS — I4819 Other persistent atrial fibrillation: Secondary | ICD-10-CM | POA: Diagnosis not present

## 2018-08-12 NOTE — Progress Notes (Signed)
Electrophysiology Office Note   Date:  08/13/2018   ID:  Hailey [name], DOB 12/09/1940, MRN [id number]  PCP:  Richardean Chimeraaniel, Terry G, MD  Cardiologist: Purvis SheffieldKoneswaran Primary Electrophysiologist:  Elica Almas Jorja LoaMartin Kyrillos Adams, MD    No chief complaint on file.    History of Present Illness: Hailey [name] is a 78 y.o. female who is being seen today for the evaluation of atrial fibrillation at the request of Richardean Chimeraaniel, Terry G, MD. Presenting today for electrophysiology evaluation.  She has a history of atrial fibrillation on amiodarone.  She was admitted to the hospital with dizziness, nausea, weakness, and near syncope.  She was noted to be in sinus bradycardia with heart rates in the 40s to 50s.  Her home Lopressor was stopped which improved her symptoms.  Her amiodarone was also stopped and she was started on Multitak.  She returns today in sinus rhythm.  Plan for ablation of atrial fibrillation.    Today, she denies symptoms of palpitations, chest pain, shortness of breath, orthopnea, PND, lower extremity edema, claudication, dizziness, presyncope, syncope, bleeding, or neurologic sequela. The patient is tolerating medications without difficulties.    Past Medical History:  Diagnosis Date  . Atrial fibrillation (HCC)   . Breast nodule 11/16/2013  . H/O hematuria   . HLD (hyperlipidemia)   . Hypertension   . 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 Migraine headache   . Palpitations   . Restless leg syndrome   . Vaginal atrophy    Past Surgical History:  Procedure Laterality Date  . APPENDECTOMY    . CARDIAC CATHETERIZATION  2005    cone  . COLONOSCOPY    . HERNIA REPAIR    . LEFT HEART CATHETERIZATION WITH CORONARY ANGIOGRAM N/A 04/12/2012   Procedure: LEFT HEART CATHETERIZATION WITH CORONARY ANGIOGRAM;  Surgeon: Kathleene Hazelhristopher D McAlhany, MD;  [facility];  Service: Cardiovascular;  Laterality: N/A;  . TOTAL ABDOMINAL HYSTERECTOMY       Current Outpatient Medications    Medication Sig Dispense Refill  . acetaminophen (TYLENOL) 650 MG CR tablet Take 650-1,300 mg by mouth See admin instructions. Take 1 tablets every morning and take 1 tablet at night    . amLODipine (NORVASC) 2.5 MG tablet Take 1 tablet (2.5 mg total) by mouth daily. 30 tablet 6  . apixaban (ELIQUIS) 5 MG TABS tablet Take 5 mg by mouth 2 (two) times daily.    . Calcium Carbonate-Vit D-Min 600-400 MG-UNIT TABS Take 1 tablet by mouth daily.     . clonazepam (KLONOPIN) 0.125 MG disintegrating tablet Take 0.125 mg by mouth at bedtime.  5  . dronedarone (MULTAQ) 400 MG tablet Take 1 tablet (400 mg total) by mouth 2 (two) times daily with a meal. 60 tablet 6  . fluticasone (FLONASE) 50 MCG/ACT nasal spray Place 2 sprays into both nostrils daily as needed for allergies or rhinitis.    Marland Kitchen. HYDROcodone-acetaminophen (NORCO/VICODIN) 5-325 MG tablet Take 1 tablet by mouth every 6 (six) hours as needed. 6 tablet 0  . loratadine (CLARITIN) 10 MG tablet Take 10 mg by mouth daily as needed for allergies.    Marland Kitchen. meclizine (ANTIVERT) 25 MG tablet Take 25 mg by mouth as needed for dizziness.    . Multiple Vitamins-Minerals (CENTRUM SILVER) tablet Take 1 tablet by mouth daily.      Marland Kitchen. omeprazole (PRILOSEC) 20 MG capsule Take 1 capsule (20 mg total) by mouth daily. 30 capsule 0  . ondansetron (ZOFRAN) 4  MG tablet Take 1 tablet (4 mg total) by mouth every 6 (six) hours as needed. (Patient taking differently: Take 4 mg by mouth every 6 (six) hours as needed for nausea or vomiting. ) 20 tablet 0   No current facility-administered medications for this visit.     Allergies:   Amiodarone and Flomax [tamsulosin hcl]   Social History:  The patient  reports that she has never smoked. She has never used smokeless tobacco. She reports current alcohol use. She reports that she does not use drugs.   Family History:  The patient's family history includes Coronary artery disease in her father; Heart attack in her brother; Heart  disease in her brother; Hypertension in her brother and mother; Other in her brother, sister, and sister; Stroke in her brother and brother; Transient ischemic attack in her brother.    ROS:  Please see the history of present illness.   Otherwise, review of systems is positive for chest pain, leg pain, palpitations, hearing loss, shortness of breath, wheezing, muscle pain, easy bruising.   All other systems are reviewed and negative.    PHYSICAL EXAM: VS:  BP 122/62   Pulse 62   Ht 5\' 7"  (1.702 m)   Wt 198 lb (89.8 kg)   BMI 31.01 kg/m  , BMI Body mass index is 31.01 kg/m. GEN: Well nourished, well developed, in no acute distress  HEENT: normal  Neck: no JVD, carotid bruits, or masses Cardiac: RRR; no murmurs, rubs, or gallops,no edema  Respiratory:  clear to auscultation bilaterally, normal work of breathing GI: soft, nontender, nondistended, + BS MS: no deformity or atrophy  Skin: warm and dry Neuro:  Strength and sensation are intact Psych: euthymic mood, full affect  EKG:  EKG is ordered today. Personal review of the ekg ordered shows sinus rhythm, right bundle branch block incomplete, low voltage  Recent Labs: 04/24/2018: ALT 21 05/22/2018: Magnesium 2.2; TSH 5.486 07/26/2018: BUN 15; Creatinine, Ser 2.38; Hemoglobin 12.9; Platelets 247; Potassium 5.0; Sodium 141    Lipid Panel  No results found for: CHOL, TRIG, HDL, CHOLHDL, VLDL, LDLCALC, LDLDIRECT   Wt Readings from Last 3 Encounters:  08/12/18 198 lb (89.8 kg)  08/02/18 199 lb (90.3 kg)  07/26/18 190 lb (86.2 kg)      Other studies Reviewed: Additional studies/ records that were reviewed today include: TTE 05/20/2017 Review of the above records today demonstrates:  - Left ventricle: The cavity size was normal. Wall thickness was   normal. Systolic function was normal. The estimated ejection   fraction was in the range of 60% to 65%. Wall motion was normal;   there were no regional wall motion abnormalities.  Left   ventricular diastolic function parameters were normal. - Aortic valve: Mildly to moderately calcified annulus. Trileaflet.   There was mild regurgitation. - Mitral valve: Calcified annulus. Normal thickness leaflets .   There was moderate regurgitation. - Tricuspid valve: There was mild regurgitation. - Pulmonary arteries: PA peak pressure: 34 mm Hg (S).   ASSESSMENT AND PLAN:  1.  Persistent atrial fibrillation: Currently in sinus rhythm on Multitak and Eliquis.  We Ogechi Kuehnel plan for ablation 08/20/2018.  Risks and benefits were discussed.  Risks include bleeding, tamponade, heart block, stroke, damage to surrounding organs.  She understands these risks and is agreed to the procedure.  She does have a plan for Mohs surgery at the end of January.  I have asked her discuss with her dermatologist whether or not she needs to  be on Eliquis and whether or not her procedure needs to be rescheduled.  2.  Hypertension: Well-controlled.  No changes.  Current medicines are reviewed at length with the patient today.   The patient does not have concerns regarding her medicines.  The following changes were made today:  none  Labs/ tests ordered today include:  Orders Placed This Encounter  Procedures  . EKG 12-Lead     Disposition:   FU with Maryela Tapper 3 months  Signed, [place] Fant Jorja Loa, MD  08/13/2018 11:12 AM     [facility] [address] [place] [address] [phone] (office) [phone] (fax)

## 2018-08-12 NOTE — Patient Instructions (Addendum)
Medication Instructions:  Your physician recommends that you continue on your current medications as directed. Please refer to the Current Medication list given to you today.  * If you need a refill on your cardiac medications before your next appointment, please call your pharmacy.   Labwork: None ordered  Testing/Procedures: Your physician has recommended that you have an ablation. Catheter ablation is a medical procedure used to treat some cardiac arrhythmias (irregular heartbeats). During catheter ablation, a long, thin, flexible tube is put into a blood vessel in your groin (upper thigh), or neck. This tube is called an ablation catheter. It is then guided to your heart through the blood vessel. Radio frequency waves destroy small areas of heart tissue where abnormal heartbeats may cause an arrhythmia to start. Please see the instruction sheet given to you today.  Follow-Up: Your physician recommends that you schedule a follow-up appointment in: 4 weeks, after your procedure on 08/20/18, with Rudi CocoDonna Carroll in the AFib clinic.  Your physician recommends that you schedule a follow-up appointment in: 3 months, after your procedure on 08/20/18, with Dr. Elberta Fortisamnitz.  *Please note that any paperwork needing to be filled out by the provider will need to be addressed at the front desk prior to seeing the provider. Please note that any FMLA, disability or other documents regarding health condition is subject to a $25.00 charge that must be received prior to completion of paperwork in the form of a money order or check.  Thank you for choosing [facility]!!   Dory HornSherri Alaney Witter, RN [phone]  Any Other Special Instructions Will Be Listed Below (If Applicable).   [facility] at ________ AM (30-45 minutes prior to test start time)  [facility] [address] [place], [place] [address] [phone]  Proceed to the [facility] Department (First Floor).  Please follow these instructions carefully (unless otherwise directed):  On the Night Before the Test: . Be sure to Drink plenty of water. . Do not consume any caffeinated/decaffeinated beverages or chocolate 12 hours prior to your test. . Do not take any antihistamines 12 hours prior to your test. . If you take Metformin do not take 24 hours prior to test.  On the Day of the Test: . Drink plenty of water. Do not drink any water within one hour of the test. . Do not eat any food 4 hours prior to the test. . You may take your regular medications prior to the test.  . HOLD Furosemide/Hydrochlorothiazide morning of the test      After the Test: . Drink plenty of water. . After receiving IV contrast, you may experience a mild flushed feeling. This is normal. . On occasion, you may experience a mild rash up to 24 hours after the test. This is not dangerous. If this occurs, you can take Benadryl 25 mg and increase your fluid intake. . If you experience trouble breathing, this can be serious. If it is severe call 911 IMMEDIATELY. If it is mild, please call our office.

## 2018-08-13 ENCOUNTER — Encounter: Payer: Self-pay | Admitting: Cardiology

## 2018-08-18 ENCOUNTER — Ambulatory Visit (HOSPITAL_COMMUNITY): Admission: RE | Admit: 2018-08-18 | Payer: Medicare Other | Source: Ambulatory Visit

## 2018-08-18 ENCOUNTER — Encounter (HOSPITAL_COMMUNITY): Payer: Self-pay

## 2018-08-18 ENCOUNTER — Ambulatory Visit (HOSPITAL_COMMUNITY): Payer: Medicare Other

## 2018-08-18 ENCOUNTER — Ambulatory Visit (HOSPITAL_COMMUNITY)
Admission: RE | Admit: 2018-08-18 | Discharge: 2018-08-18 | Disposition: A | Discharge status: Home / Self Care | Payer: Medicare Other | Source: Ambulatory Visit | Attending: Cardiology | Admitting: Cardiology

## 2018-08-18 ENCOUNTER — Telehealth (HOSPITAL_COMMUNITY): Payer: Self-pay | Admitting: Emergency Medicine

## 2018-08-18 DIAGNOSIS — I4819 Other persistent atrial fibrillation: Secondary | ICD-10-CM

## 2018-08-18 MED ORDER — IOPAMIDOL (ISOVUE-370) INJECTION 76%
80.0000 mL | Freq: Once | INTRAVENOUS | Status: AC | PRN
  Administered 2018-08-18: 80 mL via INTRAVENOUS

## 2018-08-18 NOTE — Telephone Encounter (Signed)
Pt called and located, here at [facility] radiology registration desk awaiting her cardiac CT. Reader is Delton See, IR nursing team aware, CT imaging team aware  Rockwell Alexandria RN Navigator Cardiac Imaging [facility] [phone]

## 2018-08-19 NOTE — Anesthesia Preprocedure Evaluation (Addendum)
Anesthesia Evaluation  Patient identified by MRN, date of birth, ID band Patient awake    Reviewed: Allergy & Precautions, NPO status , Patient's Chart, lab work & pertinent test results  Airway Mallampati: II  TM Distance: >3 FB Neck ROM: Full    Dental no notable dental hx. (+) Teeth Intact, Dental Advisory Given   Pulmonary neg pulmonary ROS,    Pulmonary exam normal breath sounds clear to auscultation       Cardiovascular hypertension, Pt. on medications and Pt. on home beta blockers + CAD  Normal cardiovascular exam+ dysrhythmias Atrial Fibrillation  Rhythm:Regular Rate:Normal  ECHO 05/20/17 Left ventricle: The cavity size was normal. Wall thickness was   normal. Systolic function was normal. The estimated ejection   fraction was in the range of 60% to 65%. Wall motion was normal;   there were no regional wall motion abnormalities. Left   ventricular diastolic function parameters were normal. - Aortic valve: Mildly to moderately calcified annulus. Trileaflet.   There was mild regurgitation. - Mitral valve: Calcified annulus. Normal thickness leaflets .   There was moderate regurgitation. - Tricuspid valve: There was mild regurgitation. - Pulmonary arteries: PA peak pressure: 34 mm Hg (S).  EKG 07/26/18 SB R 54   Neuro/Psych  Headaches, Anxiety    GI/Hepatic negative GI ROS, Neg liver ROS,   Endo/Other  negative endocrine ROS  Renal/GU CRFRenal diseaseCr 2.38     Musculoskeletal negative musculoskeletal ROS (+)   Abdominal   Peds  Hematology negative hematology ROS (+)   Anesthesia Other Findings   Reproductive/Obstetrics                            Lab Results  Component Value Date   CREATININE 2.38 (H) 07/26/2018   BUN 15 07/26/2018   NA 141 07/26/2018   K 5.0 07/26/2018   CL 105 07/26/2018   CO2 26 07/26/2018    Lab Results  Component Value Date   WBC 5.9 07/26/2018    HGB 12.9 07/26/2018   HCT 40.7 07/26/2018   MCV 104.1 (H) 07/26/2018   PLT 247 07/26/2018     Anesthesia Physical Anesthesia Plan  ASA: III  Anesthesia Plan: General   Post-op Pain Management:    Induction: Intravenous  PONV Risk Score and Plan: 3 and Treatment may vary due to age or medical condition, Ondansetron and Dexamethasone  Airway Management Planned: Oral ETT  Additional Equipment:   Intra-op Plan:   Post-operative Plan: Extubation in OR  Informed Consent: I have reviewed the patients History and Physical, chart, labs and discussed the procedure including the risks, benefits and alternatives for the proposed anesthesia with the patient or authorized representative who has indicated his/her understanding and acceptance.     Dental advisory given  Plan Discussed with:   Anesthesia Plan Comments:        Anesthesia Quick Evaluation

## 2018-08-20 ENCOUNTER — Other Ambulatory Visit: Payer: Self-pay

## 2018-08-20 ENCOUNTER — Ambulatory Visit (HOSPITAL_COMMUNITY)
Admission: RE | Admit: 2018-08-20 | Discharge: 2018-08-20 | Disposition: A | Discharge status: Home / Self Care | Payer: Medicare Other | Source: Ambulatory Visit | Attending: Cardiology | Admitting: Cardiology

## 2018-08-20 ENCOUNTER — Ambulatory Visit (HOSPITAL_COMMUNITY): Payer: Medicare Other | Admitting: Anesthesiology

## 2018-08-20 ENCOUNTER — Encounter (HOSPITAL_COMMUNITY)
Admission: RE | Disposition: A | Discharge status: Home / Self Care | Payer: Self-pay | Source: Ambulatory Visit | Attending: Cardiology

## 2018-08-20 DIAGNOSIS — I10 Essential (primary) hypertension: Secondary | ICD-10-CM | POA: Insufficient documentation

## 2018-08-20 DIAGNOSIS — Z888 Allergy status to other drugs, medicaments and biological substances status: Secondary | ICD-10-CM | POA: Diagnosis not present

## 2018-08-20 DIAGNOSIS — E785 Hyperlipidemia, unspecified: Secondary | ICD-10-CM | POA: Diagnosis not present

## 2018-08-20 DIAGNOSIS — Z90710 Acquired absence of both cervix and uterus: Secondary | ICD-10-CM | POA: Insufficient documentation

## 2018-08-20 DIAGNOSIS — G2581 Restless legs syndrome: Secondary | ICD-10-CM | POA: Insufficient documentation

## 2018-08-20 DIAGNOSIS — I4892 Unspecified atrial flutter: Secondary | ICD-10-CM | POA: Insufficient documentation

## 2018-08-20 DIAGNOSIS — Z79899 Other long term (current) drug therapy: Secondary | ICD-10-CM | POA: Insufficient documentation

## 2018-08-20 DIAGNOSIS — Z955 Presence of coronary angioplasty implant and graft: Secondary | ICD-10-CM | POA: Insufficient documentation

## 2018-08-20 DIAGNOSIS — I480 Paroxysmal atrial fibrillation: Secondary | ICD-10-CM

## 2018-08-20 DIAGNOSIS — I483 Typical atrial flutter: Secondary | ICD-10-CM

## 2018-08-20 DIAGNOSIS — Z7901 Long term (current) use of anticoagulants: Secondary | ICD-10-CM | POA: Diagnosis not present

## 2018-08-20 DIAGNOSIS — H8109 Meniere's disease, unspecified ear: Secondary | ICD-10-CM | POA: Diagnosis not present

## 2018-08-20 HISTORY — PX: ATRIAL FIBRILLATION ABLATION: EP1191

## 2018-08-20 LAB — BASIC METABOLIC PANEL
Anion gap: 10 (ref 5–15)
BUN: 17 mg/dL (ref 8–23)
CALCIUM: 9.3 mg/dL (ref 8.9–10.3)
CO2: 25 mmol/L (ref 22–32)
Chloride: 106 mmol/L (ref 98–111)
Creatinine, Ser: 1.77 mg/dL — ABNORMAL HIGH (ref 0.44–1.00)
GFR calc non Af Amer: 27 mL/min — ABNORMAL LOW (ref >60)
GFR, EST AFRICAN AMERICAN: 32 mL/min — AB (ref >60)
Glucose, Bld: 89 mg/dL (ref 70–99)
Potassium: 4.8 mmol/L (ref 3.5–5.1)
Sodium: 141 mmol/L (ref 135–145)

## 2018-08-20 LAB — POCT ACTIVATED CLOTTING TIME
Activated Clotting Time: 329 seconds
Activated Clotting Time: 406 seconds
Activated Clotting Time: 373 seconds
Activated Clotting Time: 136 seconds

## 2018-08-20 SURGERY — ATRIAL FIBRILLATION ABLATION
Anesthesia: General

## 2018-08-20 MED ORDER — SUGAMMADEX SODIUM 200 MG/2ML IV SOLN
INTRAVENOUS | Status: DC | PRN
  Administered 2018-08-20: 180 mg via INTRAVENOUS

## 2018-08-20 MED ORDER — SODIUM CHLORIDE 0.9 % IV SOLN
INTRAVENOUS | Status: DC
  Administered 2018-08-20: 06:00:00 via INTRAVENOUS

## 2018-08-20 MED ORDER — ONDANSETRON HCL 4 MG/2ML IJ SOLN: 4.0000 mg | Freq: Four times a day (QID) | INTRAMUSCULAR | Status: DC | PRN

## 2018-08-20 MED ORDER — HEPARIN (PORCINE) IN NACL 1000-0.9 UT/500ML-% IV SOLN
INTRAVENOUS | Status: AC
  Filled 2018-08-20: qty 500

## 2018-08-20 MED ORDER — DEXAMETHASONE SODIUM PHOSPHATE 10 MG/ML IJ SOLN
INTRAMUSCULAR | Status: DC | PRN
  Administered 2018-08-20: 10 mg via INTRAVENOUS

## 2018-08-20 MED ORDER — LIDOCAINE 2% (20 MG/ML) 5 ML SYRINGE
INTRAMUSCULAR | Status: DC | PRN
  Administered 2018-08-20: 100 mg via INTRAVENOUS

## 2018-08-20 MED ORDER — FENTANYL CITRATE (PF) 100 MCG/2ML IJ SOLN: 25.0000 ug | INTRAMUSCULAR | Status: DC | PRN

## 2018-08-20 MED ORDER — HYDROCODONE-ACETAMINOPHEN 7.5-325 MG PO TABS: 1.0000 | ORAL_TABLET | Freq: Once | ORAL | Status: DC | PRN

## 2018-08-20 MED ORDER — DOBUTAMINE IN D5W 4-5 MG/ML-% IV SOLN
INTRAVENOUS | Status: AC
  Filled 2018-08-20: qty 250

## 2018-08-20 MED ORDER — BUPIVACAINE HCL (PF) 0.25 % IJ SOLN
INTRAMUSCULAR | Status: DC | PRN
  Administered 2018-08-20: 30 mL

## 2018-08-20 MED ORDER — HEPARIN SODIUM (PORCINE) 1000 UNIT/ML IJ SOLN
INTRAMUSCULAR | Status: AC
  Filled 2018-08-20: qty 1

## 2018-08-20 MED ORDER — ACETAMINOPHEN 325 MG PO TABS
650.0000 mg | ORAL_TABLET | ORAL | Status: DC | PRN
  Administered 2018-08-20: 650 mg via ORAL

## 2018-08-20 MED ORDER — SODIUM CHLORIDE 0.9% FLUSH: 3.0000 mL | Freq: Two times a day (BID) | INTRAVENOUS | Status: DC

## 2018-08-20 MED ORDER — HEPARIN SODIUM (PORCINE) 1000 UNIT/ML IJ SOLN
INTRAMUSCULAR | Status: DC | PRN
  Administered 2018-08-20 (×2): 1000 [IU] via INTRAVENOUS

## 2018-08-20 MED ORDER — ONDANSETRON HCL 4 MG/2ML IJ SOLN
INTRAMUSCULAR | Status: DC | PRN
  Administered 2018-08-20: 4 mg via INTRAVENOUS

## 2018-08-20 MED ORDER — ACETAMINOPHEN 10 MG/ML IV SOLN: 1000.0000 mg | Freq: Once | INTRAVENOUS | Status: DC | PRN

## 2018-08-20 MED ORDER — ROCURONIUM BROMIDE 50 MG/5ML IV SOSY
PREFILLED_SYRINGE | INTRAVENOUS | Status: DC | PRN
  Administered 2018-08-20 (×2): 50 mg via INTRAVENOUS

## 2018-08-20 MED ORDER — ACETAMINOPHEN 325 MG PO TABS
ORAL_TABLET | ORAL | Status: AC
  Filled 2018-08-20: qty 2

## 2018-08-20 MED ORDER — EPHEDRINE SULFATE-NACL 50-0.9 MG/10ML-% IV SOSY
PREFILLED_SYRINGE | INTRAVENOUS | Status: DC | PRN
  Administered 2018-08-20: 5 mg via INTRAVENOUS
  Administered 2018-08-20: 10 mg via INTRAVENOUS

## 2018-08-20 MED ORDER — DOBUTAMINE IN D5W 4-5 MG/ML-% IV SOLN
INTRAVENOUS | Status: DC | PRN
  Administered 2018-08-20: 20 ug/kg/min via INTRAVENOUS

## 2018-08-20 MED ORDER — PROPOFOL 10 MG/ML IV BOLUS
INTRAVENOUS | Status: DC | PRN
  Administered 2018-08-20: 160 mg via INTRAVENOUS

## 2018-08-20 MED ORDER — SODIUM CHLORIDE 0.9% FLUSH: 3.0000 mL | INTRAVENOUS | Status: DC | PRN

## 2018-08-20 MED ORDER — HEPARIN SODIUM (PORCINE) 1000 UNIT/ML IJ SOLN
INTRAMUSCULAR | Status: DC | PRN
  Administered 2018-08-20: 2000 [IU] via INTRAVENOUS
  Administered 2018-08-20: 14000 [IU] via INTRAVENOUS

## 2018-08-20 MED ORDER — BUPIVACAINE HCL (PF) 0.25 % IJ SOLN
INTRAMUSCULAR | Status: AC
  Filled 2018-08-20: qty 30

## 2018-08-20 MED ORDER — HEPARIN (PORCINE) IN NACL 1000-0.9 UT/500ML-% IV SOLN
INTRAVENOUS | Status: DC | PRN
  Administered 2018-08-20 (×5): 500 mL

## 2018-08-20 MED ORDER — FENTANYL CITRATE (PF) 100 MCG/2ML IJ SOLN
INTRAMUSCULAR | Status: DC | PRN
  Administered 2018-08-20 (×2): 50 ug via INTRAVENOUS

## 2018-08-20 MED ORDER — SODIUM CHLORIDE 0.9 % IV SOLN: 250.0000 mL | INTRAVENOUS | Status: DC | PRN

## 2018-08-20 MED ORDER — PROTAMINE SULFATE 10 MG/ML IV SOLN
INTRAVENOUS | Status: DC | PRN
  Administered 2018-08-20: 50 mg via INTRAVENOUS

## 2018-08-20 MED ORDER — ONDANSETRON HCL 4 MG/2ML IJ SOLN: 4.0000 mg | Freq: Once | INTRAMUSCULAR | Status: DC | PRN

## 2018-08-20 SURGICAL SUPPLY — 19 items
BLANKET WARM UNDERBOD FULL ACC (MISCELLANEOUS) ×2 IMPLANT
CATH MAPPNG PENTARAY F 2-6-2MM (CATHETERS) IMPLANT
CATH SMTCH THERMOCOOL SF DF (CATHETERS) ×2 IMPLANT
CATH SOUNDSTAR 3D IMAGING (CATHETERS) ×2 IMPLANT
CATH WEBSTER BI DIR CS D-F CRV (CATHETERS) ×2 IMPLANT
COVER SWIFTLINK CONNECTOR (BAG) ×2 IMPLANT
PACK EP LATEX FREE (CUSTOM PROCEDURE TRAY) ×2
PACK EP LF (CUSTOM PROCEDURE TRAY) ×1 IMPLANT
PAD PRO RADIOLUCENT 2001M-C (PAD) ×3 IMPLANT
PATCH CARTO3 (PAD) ×2 IMPLANT
PENTARAY F 2-6-2MM (CATHETERS) ×3
SHEATH AVANTI 11F 11CM (SHEATH) ×2 IMPLANT
SHEATH BAYLIS SUREFLEX  M 8.5 (SHEATH) ×4
SHEATH BAYLIS SUREFLEX M 8.5 (SHEATH) IMPLANT
SHEATH BAYLIS TRANSSEPTAL 98CM (NEEDLE) ×2 IMPLANT
SHEATH PINNACLE 7F 10CM (SHEATH) ×2 IMPLANT
SHEATH PINNACLE 8F 10CM (SHEATH) ×4 IMPLANT
SHEATH PINNACLE 9F 10CM (SHEATH) ×4 IMPLANT
TRAY PACEMAKER INSERTION (PACKS) IMPLANT

## 2018-08-20 NOTE — Anesthesia Procedure Notes (Signed)
Procedure Name: Intubation Date/Time: 08/20/2018 7:55 AM Performed by: Rosiland Oz, CRNA Pre-anesthesia Checklist: Patient identified, Emergency Drugs available, Suction available, Patient being monitored and Timeout performed Patient Re-evaluated:Patient Re-evaluated prior to induction Oxygen Delivery Method: Circle system utilized Preoxygenation: Pre-oxygenation with 100% oxygen Induction Type: IV induction Ventilation: Mask ventilation without difficulty Laryngoscope Size: Miller and 3 Grade View: Grade I Tube type: Oral Tube size: 7.0 mm Number of attempts: 1 Airway Equipment and Method: Stylet Placement Confirmation: ETT inserted through vocal cords under direct vision,  positive ETCO2 and breath sounds checked- equal and bilateral Secured at: 21 cm Tube secured with: Tape Dental Injury: Teeth and Oropharynx as per pre-operative assessment

## 2018-08-20 NOTE — Discharge Instructions (Signed)
Femoral Site Care °This sheet gives you information about how to care for yourself after your procedure. Your health care provider may also give you more specific instructions. If you have problems or questions, contact your health care provider. °What can I expect after the procedure? °After the procedure, it is common to have: °· Bruising that usually fades within 1-2 weeks. °· Tenderness at the site. °Follow these instructions at home: °Wound care °· Follow instructions from your health care provider about how to take care of your insertion site. Make sure you: °? Wash your hands with soap and water before you change your bandage (dressing). If soap and water are not available, use hand sanitizer. °? Change your dressing as told by your health care provider. °? Leave stitches (sutures), skin glue, or adhesive strips in place. These skin closures may need to stay in place for 2 weeks or longer. If adhesive strip edges start to loosen and curl up, you may trim the loose edges. Do not remove adhesive strips completely unless your health care provider tells you to do that. °· Do not take baths, swim, or use a hot tub until your health care provider approves. °· You may shower 24-48 hours after the procedure or as told by your health care provider. °? Gently wash the site with plain soap and water. °? Pat the area dry with a clean towel. °? Do not rub the site. This may cause bleeding. °· Do not apply powder or lotion to the site. Keep the site clean and dry. °· Check your femoral site every day for signs of infection. Check for: °? Redness, swelling, or pain. °? Fluid or blood. °? Warmth. °? Pus or a bad smell. °Activity °· For the first 2-3 days after your procedure, or as long as directed: °? Avoid climbing stairs as much as possible. °? Do not squat. °· Do not lift anything that is heavier than 10 lb (4.5 kg), or the limit that you are told, until your health care provider says that it is safe. °· Rest as  directed. °? Avoid sitting for a long time without moving. Get up to take short walks every 1-2 hours. °· Do not drive for 24 hours if you were given a medicine to help you relax (sedative). °General instructions °· Take over-the-counter and prescription medicines only as told by your health care provider. °· Keep all follow-up visits as told by your health care provider. This is important. °Contact a health care provider if you have: °· A fever or chills. °· You have redness, swelling, or pain around your insertion site. °Get help right away if: °· The catheter insertion area swells very fast. °· You pass out. °· You suddenly start to sweat or your skin gets clammy. °· The catheter insertion area is bleeding, and the bleeding does not stop when you hold steady pressure on the area. °· The area near or just beyond the catheter insertion site becomes pale, cool, tingly, or numb. °These symptoms may represent a serious problem that is an emergency. Do not wait to see if the symptoms will go away. Get medical help right away. Call your local emergency services (911 in the [place] Do not drive yourself to the hospital. °Summary °· After the procedure, it is common to have bruising that usually fades within 1-2 weeks. °· Check your femoral site every day for signs of infection. °· Do not lift anything that is heavier than 10 lb (4.5 kg), or the   limit that you are told, until your health care provider says that it is safe. This information is not intended to replace advice given to you by your health care provider. Make sure you discuss any questions you have with your health care provider. Document Released: 03/24/2014 Document Revised: 08/03/2017 Document Reviewed: 08/03/2017 Elsevier Interactive Patient Education  2019 [employer].    Post procedure care instructions No driving for 4 days. No lifting over 5 lbs for 1 week. No vigorous or sexual activity for 1 week. You may return to work on 08/27/2018. Keep  procedure site clean & dry. If you notice increased pain, swelling, bleeding or pus, call/return!  You may shower, but no soaking baths/hot tubs/pools for 1 week.    You have an appointment set up with the Atrial Fibrillation Clinic.  Multiple studies have shown that being followed by a dedicated atrial fibrillation clinic in addition to the standard care you receive from your other physicians improves health. We believe that enrollment in the atrial fibrillation clinic will allow [place] to better care for you.   The phone number to the Atrial Fibrillation Clinic is [phone]. The clinic is staffed Monday through Friday from 8:30am to 5pm.  Parking Directions: The clinic is located in the Heart and Vascular Building connected to [facility]. 1)From [address] turn on to [address] and go to the 3rd entrance  (Heart and Vascular entrance) on the right. 2)Look to the right for Heart &Vascular Parking Garage. 3)A code for the entrance is required please call the clinic to receive this.   4)Take the elevators to the 1st floor. Registration is in the room with the glass walls at the end of the hallway.  If you have any trouble parking or locating the clinic, please dont hesitate to call [phone].

## 2018-08-20 NOTE — Progress Notes (Signed)
Patient seen post AF ablation. Leg sites stable. Plan for discharge today. Activity teaching performed. Discharge when bedrest over.  Domingo Fuson Elberta Fortis, MD 08/20/2018 4:07 PM

## 2018-08-20 NOTE — H&P (Signed)
Hailey [name] has presented today for surgery, with the diagnosis of catheter ablation.  The various methods of treatment have been discussed with the patient and family. After consideration of risks, benefits and other options for treatment, the patient has consented to  Procedure(s): Atrial fibrillation as a surgical intervention .  Risks include but not limited to bleeding, tamponade, heart block, stroke, damage to surrounding organs, among others. The patient's history has been reviewed, patient examined, no change in status, stable for surgery.  I have reviewed the patient's chart and labs.  Questions were answered to the patient's satisfaction.    [name], MD 08/20/2018 7:05 AM

## 2018-08-20 NOTE — Progress Notes (Addendum)
Site area: Right groin a 9 french venous sheaths X2 were removed  Site Prior to Removal:  Level 0  Pressure Applied For 20 MINUTES    Bedrest beginning at 11240p  Manual:   Yes.    Patient Status During Pull:  stable  Post Pull Groin Site:  Level 0  Post Pull Instructions Given:  Yes.    Post Pull Pulses Present:  Yes.    Dressing Applied:  Yes.    Comments:  VS remain stable

## 2018-08-20 NOTE — Progress Notes (Addendum)
Site area: Left groin a 7, and 11 french venous sheath was removed  Site Prior to Removal:  Level 0  Pressure Applied For 20 MINUTES    Bedrest Beginning at 1240p Manual:   Yes.    Patient Status During Pull:  stable  Post Pull Groin Site:  Level 0  Post Pull Instructions Given:  Yes.    Post Pull Pulses Present:  Yes.    Dressing Applied:  Yes.    Comments:

## 2018-08-20 NOTE — Transfer of Care (Signed)
Immediate Anesthesia Transfer of Care Note  Patient: Hailey [name]  Procedure(s) Performed: ATRIAL FIBRILLATION ABLATION (N/A )  Patient Location: Cath Lab  Anesthesia Type:General  Level of Consciousness: awake and patient cooperative  Airway & Oxygen Therapy: Patient Spontanous Breathing  Post-op Assessment: Report given to RN and Post -op Vital signs reviewed and stable  Post vital signs: Reviewed and stable  Last Vitals:  Vitals Value Taken Time  BP 142/66 08/20/2018 11:28 AM  Temp    Pulse 77 08/20/2018 11:29 AM  Resp 14 08/20/2018 11:29 AM  SpO2 98 % 08/20/2018 11:29 AM  Vitals shown include unvalidated device data.  Last Pain:  Vitals:   08/20/18 1127  PainSc: 0-No pain      Patients Stated Pain Goal: 5 (08/20/18 8841)  Complications: No apparent anesthesia complications

## 2018-08-20 NOTE — Progress Notes (Signed)
Up and walked and tolerated well; bilat groins stable, no bleeding or hematoma 

## 2018-08-20 NOTE — Anesthesia Postprocedure Evaluation (Signed)
Anesthesia Post Note  Patient: Hailey [name]  Procedure(s) Performed: ATRIAL FIBRILLATION ABLATION (N/A )     Patient location during evaluation: PACU Anesthesia Type: General Level of consciousness: awake and alert Pain management: pain level controlled Vital Signs Assessment: post-procedure vital signs reviewed and stable Respiratory status: spontaneous breathing, nonlabored ventilation, respiratory function stable and patient connected to nasal cannula oxygen Cardiovascular status: blood pressure returned to baseline and stable Postop Assessment: no apparent nausea or vomiting Anesthetic complications: no    Last Vitals:  Vitals:   08/20/18 1305 08/20/18 1325  BP: (!) 154/64 (!) 154/64  Pulse: 80 (!) 16  Resp: 16   Temp:    SpO2: 100% 100%    Last Pain:  Vitals:   08/20/18 1325  PainSc: 7                  Trevor Iha

## 2018-08-21 ENCOUNTER — Telehealth: Payer: Self-pay | Admitting: Physician Assistant

## 2018-08-21 NOTE — Telephone Encounter (Signed)
Recently underwent afib ablation from R groin. Since discharge patient has been having persistent 3/10 pain. She currently does not see any bruising or bleeding. Recommend continue observe and see either general cardiology or EP APP early next week. She will call on Monday to schedule app. She is aware that if there is a increasing bulge or worsening pain in the groin area, she will need to seek medical attention urgently.   Ramond Dial PA Pager: [phone]

## 2018-08-23 ENCOUNTER — Telehealth: Payer: Self-pay | Admitting: Cardiology

## 2018-08-23 ENCOUNTER — Encounter (HOSPITAL_COMMUNITY): Payer: Self-pay | Admitting: Cardiology

## 2018-08-23 NOTE — Telephone Encounter (Signed)
  Patient recently had an ablasion and she has cleaned the area and would like to know is she supposed to put a new bandage?  Patient also states that she has experienced her heart racing the past two days when she wakes up. She doesn't feel it when she is up and moving around.   Patient also states that she has had some pain on her right side since the procedure.

## 2018-08-23 NOTE — Telephone Encounter (Signed)
Pt advised she may remove bandage and that she does not need one anymore. Educated pt on "break through afib" and what to monitor and when to call the office. Pt will also continue to monitor her "right sided pain".  She understands to call the office if pain changes/worsens and/or SOB begins.  Advised ok to take OTC Tylenol is needed. Patient verbalized understanding and agreeable to plan.

## 2018-08-25 ENCOUNTER — Telehealth: Payer: Self-pay | Admitting: Cardiology

## 2018-08-25 NOTE — Telephone Encounter (Signed)
Pt reports that she started to feel weak/nauseous/dizzy this morning.   At 9:00 am:  BP 121/73, HR 75 Pt denies syncope/CP/SOB. Pt informed that symptoms reported not related to ablation.  She asked if her sugar could be low (she is not diabetic) and I advised her intake sugars (graham cracker, peanut butter, etc) and see if that helps. Advised her to follow up with her PCP on reported symptoms in case she is "coming down with something". Informed that I would forward to Dr. Elberta Fortisamnitz for his FYI/review.  She understands I will call her back if he has anything else to add to above advisement.

## 2018-08-25 NOTE — Telephone Encounter (Signed)
Pt c/o Syncope: STAT if syncope occurred within 30 minutes and pt complains of lightheadedness High Priority if episode of passing out, completely, today or in last 24 hours   1. Did you pass out today? no  2. When is the last time you passed out? n/a  3. Has this occurred multiple times? n/a  4. Did you have any symptoms prior to passing out? n/a  Patient called she had a ablation done Friday, she states she is feeling faint, weak and nauseous.

## 2018-09-01 ENCOUNTER — Telehealth: Payer: Self-pay | Admitting: Cardiology

## 2018-09-01 NOTE — Telephone Encounter (Signed)
lmtcb Informed pt that it would tomorrow before Dr. Elberta Fortis could review/advise. Asked her to call me back to discuss her reported symptoms and see if this might be AFib related SE

## 2018-09-01 NOTE — Telephone Encounter (Signed)
Will route to Dory Horn, RN for Dr. Elberta Fortis. Pt has appt with Rudi Coco 09/16/18

## 2018-09-01 NOTE — Telephone Encounter (Signed)
   Pt c/o medication issue:  1. Name of Medication: dronedarone (MULTAQ) 400 MG tablet  2. How are you currently taking this medication (dosage and times per day)? Take 1 tablet (400 mg total) by mouth 2 (two) times daily with a meal.  3. Are you having a reaction (difficulty breathing--STAT)? Nausea, feeling of weakness  4. What is your medication issue?  Patient had an ablation on 08/20/18 and on 08/23/18 she called in because she was feeling nauseous. She ended up going to the ER at [facility] and they didn't find anything wrong. Her PCP she saw today said he thought it could be the medication that Dr Elberta Fortis put her on until she sees him again. Patient wonders if she needs to be seen.

## 2018-09-01 NOTE — Telephone Encounter (Signed)
  Patient is calling back to add that she is having swelling in ankles and legs, wheezing, and coughing and congestion in addition to the nausea

## 2018-09-01 NOTE — Telephone Encounter (Signed)
Forwarding to [employer] for advisement.  Please read all notes. Dr. Elberta Fortis - stop Multaq??

## 2018-09-02 ENCOUNTER — Encounter (HOSPITAL_COMMUNITY): Payer: Self-pay | Admitting: Emergency Medicine

## 2018-09-02 ENCOUNTER — Emergency Department (HOSPITAL_COMMUNITY)
Admission: EM | Admit: 2018-09-02 | Discharge: 2018-09-02 | Disposition: A | Discharge status: Home / Self Care | Payer: Medicare Other | Attending: Emergency Medicine | Admitting: Emergency Medicine

## 2018-09-02 ENCOUNTER — Other Ambulatory Visit: Payer: Self-pay

## 2018-09-02 DIAGNOSIS — Z7901 Long term (current) use of anticoagulants: Secondary | ICD-10-CM | POA: Diagnosis not present

## 2018-09-02 DIAGNOSIS — R600 Localized edema: Secondary | ICD-10-CM | POA: Insufficient documentation

## 2018-09-02 DIAGNOSIS — R1031 Right lower quadrant pain: Secondary | ICD-10-CM

## 2018-09-02 DIAGNOSIS — Z79899 Other long term (current) drug therapy: Secondary | ICD-10-CM | POA: Diagnosis not present

## 2018-09-02 DIAGNOSIS — R0602 Shortness of breath: Secondary | ICD-10-CM | POA: Diagnosis not present

## 2018-09-02 DIAGNOSIS — I10 Essential (primary) hypertension: Secondary | ICD-10-CM | POA: Diagnosis not present

## 2018-09-02 DIAGNOSIS — R1030 Lower abdominal pain, unspecified: Secondary | ICD-10-CM | POA: Insufficient documentation

## 2018-09-02 NOTE — Discharge Instructions (Addendum)
You were seen in the emergency department for right inguinal pain.  There is no sign of infection, blood clot, aneurysm, active bleeding currently.  Please follow-up with your cardiologist.  Pain could be musculoskeletal in nature.  You may use Tylenol 1000 mg every 6 hours as needed for pain.  If you develop fever of 100.4 or higher, increasing pain in this area, redness or warmth, swelling in this area or increased swelling in the right leg compared to the left, blue or cold foot or numbness or tingling that is new, please return to the emergency department immediately.

## 2018-09-02 NOTE — Telephone Encounter (Signed)
Advised pt to stop Multaq per Dr. Elberta Fortis. Advised to call our office/AFib clinic if she experiences Afib issues after stopping the medication.  Advised to call back if after a week/two off the Multaq her symptoms don't subside. Patient verbalized understanding and agreeable to plan.

## 2018-09-02 NOTE — ED Notes (Signed)
Pt discharged from ED; instructions provided; Pt encouraged to return to ED if symptoms worsen and to f/u with PCP; Pt verbalized understanding of all instructions 

## 2018-09-02 NOTE — ED Triage Notes (Addendum)
Per pt she has an ablation for her a-fib on Jan 17th have had no complications until tonight. No N/V, NO SOB NO CHEST PAIN. Pt was out and went to church and wore regular panties. Stated she is hurting in the right lower groin areA where her panties are rubbing. Stated it is aching in that area.. No distress noted at this time.

## 2018-09-02 NOTE — Telephone Encounter (Signed)
Follow Up: ° ° ° °Pt returning your call from yesterday. °

## 2018-09-02 NOTE — ED Provider Notes (Signed)
TIME SEEN: 3:10 AM  CHIEF COMPLAINT: Right inguinal pain  HPI: Patient is a 78 year old female with history of hypertension, hyperlipidemia, atrial fibrillation who underwent ablation by Dr. Elberta Fortisamnitz on 08/20/2018.  States that after the procedure she was feeling fine.  Started having some throbbing pain in the right inguinal region today and became concerned that she could be "bleeding internally".  She states that both femoral arteries were accessed during her procedure.  She is on Eliquis and resumed Eliquis on January 18.  She states that the throbbing pain is intermittent.  No injury to this leg.  She states that she did do more today than she has over the past several days.  No numbness or tingling in the right leg.  No calf tenderness or swelling.  States she always has some swelling in both legs which is chronic and stable.  No redness or warmth.  No swelling to the groin.  States area initially became irritated with her underwear rubbing on the skin.  She denies chest pain.  Has chronic shortness of breath which is unchanged.   ROS: See HPI Constitutional: no fever  Eyes: no drainage  ENT: no runny nose   Cardiovascular:  no chest pain  Resp: Chronic stable SOB  GI: no vomiting GU: no dysuria Integumentary: no rash  Allergy: no hives  Musculoskeletal: no leg swelling  Neurological: no slurred speech ROS otherwise negative  PAST MEDICAL HISTORY/PAST SURGICAL HISTORY:  Past Medical History:  Diagnosis Date  . Atrial fibrillation (HCC)   . Breast nodule 11/16/2013  . H/O hematuria   . HLD (hyperlipidemia)   . Hypertension   . 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 Migraine headache   . Palpitations   . Restless leg syndrome   . Vaginal atrophy     MEDICATIONS:  Prior to Admission medications   Medication Sig Start Date End Date Taking? Authorizing Provider  acetaminophen (TYLENOL) 650 MG CR tablet Take 650-1,300 mg by mouth 2 (two) times daily as needed (pain.).      [provider]  amLODipine (NORVASC) 2.5 MG tablet Take 1 tablet (2.5 mg total) by mouth daily. 07/15/18   Sheilah PigeonUrsuy, Renee Lynn, PA-C  apixaban (ELIQUIS) 5 MG TABS tablet Take 5 mg by mouth 2 (two) times daily.    [provider]  Calcium Carb-Cholecalciferol (CALCIUM 600+D3 PO) Take 1 tablet by mouth daily.    [provider]  clonazepam (KLONOPIN) 0.125 MG disintegrating tablet Take 0.125 mg by mouth at bedtime. 01/12/15   [provider]  dronedarone (MULTAQ) 400 MG tablet Take 1 tablet (400 mg total) by mouth 2 (two) times daily with a meal. 07/15/18   Sheilah PigeonUrsuy, Renee Lynn, PA-C  fluticasone [facility](FLONASE) 50 MCG/ACT nasal spray Place 2 sprays into both nostrils daily as needed for allergies or rhinitis.    [provider]  HYDROcodone-acetaminophen (NORCO/VICODIN) 5-325 MG tablet Take 1 tablet by mouth every 6 (six) hours as needed. 07/27/18   Horton, Mayer Maskerourtney F, MD  loratadine (CLARITIN) 10 MG tablet Take 10 mg by mouth daily as needed for allergies.    [provider]  meclizine (ANTIVERT) 25 MG tablet Take 25 mg by mouth 2 (two) times daily as needed for dizziness.     [provider]  Multiple Vitamin (MULTIVITAMIN WITH MINERALS) TABS tablet Take 1 tablet by mouth at bedtime. Centrum Silver for Women    [provider]  omeprazole (PRILOSEC) 20 MG capsule Take 1 capsule (  20 mg total) by mouth daily. 07/27/18   Horton, Mayer Maskerourtney F, MD  ondansetron (ZOFRAN) 4 MG tablet Take 1 tablet (4 mg total) by mouth every 6 (six) hours as needed. Patient taking differently: Take 4 mg by mouth every 6 (six) hours as needed for nausea or vomiting.  04/24/18   Dione BoozeGlick, David, MD    ALLERGIES:  Allergies  Allergen Reactions  . Amiodarone Itching  . Flomax [Tamsulosin Hcl] Nausea And Vomiting    SOCIAL HISTORY:  Social History   Tobacco Use  . Smoking status: Never Smoker  . Smokeless tobacco: Never Used  Substance Use Topics  . Alcohol  use: Yes    Comment: rarely; glass of wine with dinner    FAMILY HISTORY: Family History  Problem Relation Age of Onset  . Coronary artery disease Father        mid 6450s  . Hypertension Mother   . Stroke Brother   . Transient ischemic attack Brother   . Other Brother        on oxygen  . Hypertension Brother   . Stroke Brother   . Heart disease Brother   . Heart attack Brother   . Other Sister        a fib, restless leg  . Other Sister        pressure in eyes    EXAM: BP (!) 141/59 (BP Location: Left Arm)   Pulse 73   Temp 97.7 F (36.5 C) (Oral)   Resp 18   Ht 5\' 7"  (1.702 m)   Wt 86.2 kg   SpO2 99%   BMI 29.76 kg/m  CONSTITUTIONAL: Alert and oriented and responds appropriately to questions. Well-appearing; well-nourished, elderly HEAD: Normocephalic EYES: Conjunctivae clear, pupils appear equal, EOMI ENT: normal nose; moist mucous membranes NECK: Supple, no meningismus, no nuchal rigidity, no LAD  CARD: RRR; S1 and S2 appreciated; no murmurs, no clicks, no rubs, no gallops RESP: Normal chest excursion without splinting or tachypnea; breath sounds clear and equal bilaterally; no wheezes, no rhonchi, no rales, no hypoxia or respiratory distress, speaking full sentences ABD/GI: Normal bowel sounds; non-distended; soft, non-tender, no rebound, no guarding, no peritoneal signs, no hepatosplenomegaly BACK:  The back appears normal and is non-tender to palpation, there is no CVA tenderness EXT: Patient's right groin appears normal.  No significant tenderness to the inguinal area.  I do not even appreciate a puncture wound here anymore.  There is no redness, warmth, swelling, ecchymosis, hematoma.  She has strong palpable right femoral pulse as well as a right DP pulse.  Normal sensation throughout the entire right leg.  No swelling of the right leg compared to the left.  Compartments are soft.  No inguinal lymphadenopathy.  No bony tenderness.  Full range of motion in the hip  without leg length discrepancy. SKIN: Normal color for age and race; warm; no rash NEURO: Moves all extremities equally PSYCH: The patient's mood and manner are appropriate. Grooming and personal hygiene are appropriate.  MEDICAL DECISION MAKING: Patient here for pain in the right inguinal area.  She states she was concerned that she could be "bleeding internally.  There is no sign of active bleeding, hematoma, aneurysm, cellulitis, abscess, lymphadenopathy.  Doubt arterial obstruction or DVT.  No recent injury to suggest fracture.  She is able to ambulate.  She is neurovascularly intact distally.  She has strong femoral pulse.  I feel she is safe to be discharged and follow-up with her cardiologist as an outpatient.  She states she plans to call them in the morning for something else anyway.  She has no new chest pain or shortness of breath today.  We did discuss return precautions.  Recommended Tylenol for pain control.  Patient is comfortable with this plan.  At this time, I do not feel there is any life-threatening condition present. I have reviewed and discussed all results (EKG, imaging, lab, urine as appropriate) and exam findings with patient/family. I have reviewed nursing notes and appropriate previous records.  I feel the patient is safe to be discharged home without further emergent workup and can continue workup as an outpatient as needed. Discussed usual and customary return precautions. Patient/family verbalize understanding and are comfortable with this plan.  Outpatient follow-up has been provided as needed. All questions have been answered.     , Layla Maw, DO 09/02/18 0330

## 2018-09-08 ENCOUNTER — Telehealth: Payer: Self-pay | Admitting: Cardiology

## 2018-09-08 NOTE — Telephone Encounter (Signed)
Pt had an ablation 2-3 wks ago, and the pt is having Afib breakthrough.  Pt states that she was taking a shower yesterday and started having a rapid heartbeat. She wanted to know if she should resume taking the Multaq , or if there is another medication she should start taking

## 2018-09-08 NOTE — Telephone Encounter (Signed)
lmtcb Left message that I was out of the office tomorrow, Thursday 2/6, but informed that I would leave details on Dr. Elberta Fortis recommendations and that she could speak to a triage nurse who would give her orders/instructions.  Advise: Start Flecainide 100 mg twice daily.  Start Toprol XL 12.5 mg daily.  She will be evaluated on how she is doing on these new meds at [facility] w/ AFib clinic next Thursday, 2/13

## 2018-09-09 MED ORDER — METOPROLOL SUCCINATE ER 25 MG PO TB24: 12.5000 mg | ORAL_TABLET | Freq: Every day | ORAL | 1 refills | Status: DC

## 2018-09-09 MED ORDER — FLECAINIDE ACETATE 100 MG PO TABS: 100.0000 mg | ORAL_TABLET | Freq: Two times a day (BID) | ORAL | 1 refills | Status: DC

## 2018-09-09 NOTE — Telephone Encounter (Signed)
Spoke with the pt and endorsed to her new medication recommendations per Dr Elberta Fortis.  Endorsed to the pt that Dr Elberta Fortis wants her to start taking Flecainide 100 mg po bid, and low dose Toprol XL 12.5 mg po daily, and follow-up as planned in afib clinic to see Rudi Coco NP on 09/16/18 at 1000.  Confirmed the pharmacy of choice with the pt.  Endorsed to the pt that if she has any issues with these meds, please call the office back to inform a triage nurse or Sherri RN.  Pt verbalized understanding and agrees with this plan.

## 2018-09-09 NOTE — Telephone Encounter (Signed)
Follow Up:    Pt returning Sherri's call from yesterday. She was told to ask for triage today.

## 2018-09-14 ENCOUNTER — Telehealth: Payer: Self-pay | Admitting: Cardiology

## 2018-09-14 MED ORDER — FLECAINIDE ACETATE 50 MG PO TABS: 50.0000 mg | ORAL_TABLET | Freq: Two times a day (BID) | ORAL | 3 refills | Status: DC

## 2018-09-14 NOTE — Telephone Encounter (Signed)
Advised pt to half her Flecainide - Take 50 mg BID, per Dr. Elberta Fortis. Pt agreeable to plan. Pt inquired if SE could be coming from Toprol.  Informed that either medication could be causing reported SE.  Agreeable to cutting Flecainide first.   She follows up w/ Rudi Coco this Thursday and can discuss further at that appt. She apologizes for having to call so much.  I told her not to apologize and explained that is what we are here for. She appreciates advisement and is agreeable to plan.

## 2018-09-14 NOTE — Telephone Encounter (Signed)
Pt calls in reporting extreme SE since starting the Flecainide.  (started Flecainide Friday). She reports that Sat & Wynelle Link she was ok.  She started experiencing nausea yesterday and had to take Zofran.  States that she woke up this morning, ate breakfast and took her medication.  Now she is "sick as a dog", "feeling hot & sweaty".  States that her stomach "feels like it is rolling", "like you have to vomit and have diarrhea at the same time".  She has taken Zofran again for the nausea. She would like advisement from Dr. Elberta Fortis.  She states she cannot continue normal life like this. She is going to hold her Flecainide until further advised on. She asks if she can half the Flecainide to see if that would reduce the SE. Pt aware I will have Dr. Elberta Fortis review and call her w/ recommendation/s.

## 2018-09-14 NOTE — Telephone Encounter (Signed)
Pt c/o medication issue:  1. Name of Medication: Flecianide  2. How are you currently taking this medication (dosage and times per day)? 100 mg 2 times a day  3. Are you having a reaction (difficulty breathing--STAT)? no  4. What is your medication issue? Nauseated yesterday- this morning at 6:00 --she took-,hot sweaty. Stomach hurt so bad,nauseated, felt like she had diarrhea, a little faint. She took Zofran and laid down, felt better

## 2018-09-16 ENCOUNTER — Ambulatory Visit (HOSPITAL_COMMUNITY)
Admission: RE | Admit: 2018-09-16 | Discharge: 2018-09-16 | Disposition: A | Discharge status: Home / Self Care | Payer: Medicare Other | Source: Ambulatory Visit | Attending: Nurse Practitioner | Admitting: Nurse Practitioner

## 2018-09-16 VITALS — BP 134/72 | HR 56 | Ht 67.0 in | Wt 198.6 lb

## 2018-09-16 DIAGNOSIS — I10 Essential (primary) hypertension: Secondary | ICD-10-CM | POA: Diagnosis not present

## 2018-09-16 DIAGNOSIS — I959 Hypotension, unspecified: Secondary | ICD-10-CM | POA: Insufficient documentation

## 2018-09-16 DIAGNOSIS — I480 Paroxysmal atrial fibrillation: Secondary | ICD-10-CM | POA: Diagnosis not present

## 2018-09-16 DIAGNOSIS — R002 Palpitations: Secondary | ICD-10-CM | POA: Diagnosis not present

## 2018-09-16 DIAGNOSIS — Z8249 Family history of ischemic heart disease and other diseases of the circulatory system: Secondary | ICD-10-CM | POA: Diagnosis not present

## 2018-09-16 DIAGNOSIS — I4891 Unspecified atrial fibrillation: Secondary | ICD-10-CM | POA: Diagnosis not present

## 2018-09-16 DIAGNOSIS — R609 Edema, unspecified: Secondary | ICD-10-CM | POA: Insufficient documentation

## 2018-09-16 DIAGNOSIS — I4892 Unspecified atrial flutter: Secondary | ICD-10-CM | POA: Insufficient documentation

## 2018-09-16 DIAGNOSIS — Z79899 Other long term (current) drug therapy: Secondary | ICD-10-CM | POA: Diagnosis not present

## 2018-09-16 DIAGNOSIS — Z7901 Long term (current) use of anticoagulants: Secondary | ICD-10-CM | POA: Diagnosis not present

## 2018-09-16 DIAGNOSIS — H8109 Meniere's disease, unspecified ear: Secondary | ICD-10-CM | POA: Diagnosis not present

## 2018-09-16 NOTE — Progress Notes (Signed)
Primary Care Physician: Richardean Chimera, MD Referring Physician: Dr. Ward Chatters Hailey [name] is a 78 y.o. female with a h/o afib that is in the afib clinic for evaluation. She was dx with afib one year ago  and was placed on amiodarone. She did well until 2 weeks ago when she started having random dizzy/nausea episodes. It was felt that amiodarone may be the culprit and it was stopped. She was then placed on Toprol and continued to have worse spells associated with hypotension. For one of these episodes she presented to the ER and found to be hypotensive. BB/and amlodipine were stopped. Sine then she continues to have some of these episodes. Since stopping the BB/CCB, she has not had any further issues with low blood pressure. She has checked her heart rate with episodes and appears to be in normal range. She is in  afib  on initial visit in the afib clinc  but felt ok. She has felt syncopal but no true syncope.Labs in ER within normal limits.  F/u in afib clinic 10/17. Monitor was placed that showed pt was in atrial flutter the whole time with v rates average at 100 bpm, range 41-140. Her last amiodarone level was too high at 1.4, 3 weeks ago, to consider starting another antiarrythmic. Will repeat today. She is still c/o of daily  H/A, intermittent nausea and fatigue. Her PCP worked up H/A recently without any abnormal findings. Discussed trying low dose BB for rate control to see if I can make her feel better until amio wash out. She will have to watch her BP's carefully to look for untoward hypotension.  F/u in Afib clinic, 11/7. She is still struggling with persisitent afib trying to wash out amiodarone. She is tolerating the low dose BB and will increase to have better rate control during the wait. I think she is less symptomatic than when I first started seeing her though. Again discussed ablation, but does not want to commit at this point.  F/u in afib clinic 12/4. She is in Sinus brady  today and is c/o of some shortness of breath and fatigue, possibly for HR in the 40's. Did not realize she was in rhythm today but has not noted hearing any heart beat in her head recently. amiodarone level is pending, still planning on Tikosyn when amio level is low enough.  F/u in the afib clinic, 2/13, s/p ablation 08/20/18. She did have afib after the procedure and initially tried Multaq and did not tolerate. Dr. Elberta Fortis next rx'ed flecainide 100 mg bid and tolerated for the first few days. She then developed GI upset and 2 days ago the dose was decreased to 50 mg bid. She is now tolerating this better. She is in SR. No swallowing or groin issues. Notices low BP readings in the am. Has had some mild pedal edema since starting flecainide, discussed trying a few doses of furosemide but she is reluctant  to start any new meds.  Today, she denies symptoms of chest pain, shortness of breath, orthopnea, PND, lower extremity edema, dizziness, presyncope, syncope, or neurologic sequela. The patient is tolerating medications without difficulties and is otherwise without complaint today.   Past Medical History:  Diagnosis Date  . Atrial fibrillation (HCC)   . Breast nodule 11/16/2013  . H/O hematuria   . HLD (hyperlipidemia)   . Hypertension   . 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 Migraine headache   . Palpitations   .  Restless leg syndrome   . Vaginal atrophy    Past Surgical History:  Procedure Laterality Date  . APPENDECTOMY    . ATRIAL FIBRILLATION ABLATION N/A 08/20/2018   Procedure: ATRIAL FIBRILLATION ABLATION;  Surgeon: Regan Lemming, MD;  Location: [facility] INVASIVE CV LAB;  Service: Cardiovascular;  Laterality: N/A;  . CARDIAC CATHETERIZATION  2005    cone  . COLONOSCOPY    . HERNIA REPAIR    . LEFT HEART CATHETERIZATION WITH CORONARY ANGIOGRAM N/A 04/12/2012   Procedure: LEFT HEART CATHETERIZATION WITH CORONARY ANGIOGRAM;  Surgeon: Kathleene Hazel, MD;  Location: [facility] CATH  LAB;  Service: Cardiovascular;  Laterality: N/A;  . TOTAL ABDOMINAL HYSTERECTOMY      Current Outpatient Medications  Medication Sig Dispense Refill  . acetaminophen (TYLENOL) 650 MG CR tablet Take 650-1,300 mg by mouth 2 (two) times daily as needed (pain.).     Marland Kitchen amLODipine (NORVASC) 2.5 MG tablet Take 1 tablet (2.5 mg total) by mouth daily. 30 tablet 6  . apixaban (ELIQUIS) 5 MG TABS tablet Take 5 mg by mouth 2 (two) times daily.    . Calcium Carb-Cholecalciferol (CALCIUM 600+D3 PO) Take 1 tablet by mouth daily.    . clonazepam (KLONOPIN) 0.125 MG disintegrating tablet Take 0.125 mg by mouth at bedtime.  5  . flecainide (TAMBOCOR) 50 MG tablet Take 1 tablet (50 mg total) by mouth 2 (two) times daily. 60 tablet 3  . fluticasone (FLONASE) 50 MCG/ACT nasal spray Place 2 sprays into both nostrils daily as needed for allergies or rhinitis.    Marland Kitchen HYDROcodone-acetaminophen (NORCO/VICODIN) 5-325 MG tablet Take 1 tablet by mouth every 6 (six) hours as needed. 6 tablet 0  . loratadine (CLARITIN) 10 MG tablet Take 10 mg by mouth daily as needed for allergies.    Marland Kitchen meclizine (ANTIVERT) 25 MG tablet Take 25 mg by mouth 2 (two) times daily as needed for dizziness.     . metoprolol succinate (TOPROL XL) 25 MG 24 hr tablet Take 0.5 tablets (12.5 mg total) by mouth daily. 45 tablet 1  . Multiple Vitamin (MULTIVITAMIN WITH MINERALS) TABS tablet Take 1 tablet by mouth at bedtime. Centrum Silver for Women    . omeprazole (PRILOSEC) 20 MG capsule Take 1 capsule (20 mg total) by mouth daily. 30 capsule 0  . ondansetron (ZOFRAN) 4 MG tablet Take 1 tablet (4 mg total) by mouth every 6 (six) hours as needed. (Patient not taking: Reported on 09/16/2018) 20 tablet 0   No current facility-administered medications for this encounter.     Allergies  Allergen Reactions  . Amiodarone Itching  . Flomax [Tamsulosin Hcl] Nausea And Vomiting    Social History   Socioeconomic History  . Marital status: Single    Spouse  name: Not on file  . Number of children: 0  . Years of education: College  . Highest education level: Not on file  Occupational History  . Occupation: retired  Engineer, production  . Financial resource strain: Not on file  . Food insecurity:    Worry: Not on file    Inability: Not on file  . Transportation needs:    Medical: Not on file    Non-medical: Not on file  Tobacco Use  . Smoking status: Never Smoker  . Smokeless tobacco: Never Used  Substance and Sexual Activity  . Alcohol use: Yes    Comment: rarely; glass of wine with dinner  . Drug use: No  . Sexual activity: Never    Birth  control/protection: Surgical    Comment: hyst  Lifestyle  . Physical activity:    Days per week: Not on file    Minutes per session: Not on file  . Stress: Not on file  Relationships  . Social connections:    Talks on phone: Not on file    Gets together: Not on file    Attends religious service: Not on file    Active member of club or organization: Not on file    Attends meetings of clubs or organizations: Not on file    Relationship status: Not on file  . Intimate partner violence:    Fear of current or ex partner: Not on file    Emotionally abused: Not on file    Physically abused: Not on file    Forced sexual activity: Not on file  Other Topics Concern  . Not on file  Social History Narrative   Single. Retired.    Patient lives at home alone.   Caffeine Use: none    Family History  Problem Relation Age of Onset  . Coronary artery disease Father        mid 34s  . Hypertension Mother   . Stroke Brother   . Transient ischemic attack Brother   . Other Brother        on oxygen  . Hypertension Brother   . Stroke Brother   . Heart disease Brother   . Heart attack Brother   . Other Sister        a fib, restless leg  . Other Sister        pressure in eyes    ROS- All systems are reviewed and negative except as per the HPI above  Physical Exam: Vitals:   09/16/18 0952    Weight: 90.1 kg  Height: 5\' 7"  (1.702 m)   Wt Readings from Last 3 Encounters:  09/16/18 90.1 kg  09/02/18 86.2 kg  08/12/18 89.8 kg    Labs: Lab Results  Component Value Date   NA 141 08/20/2018   K 4.8 08/20/2018   CL 106 08/20/2018   CO2 25 08/20/2018   GLUCOSE 89 08/20/2018   BUN 17 08/20/2018   CREATININE 1.77 (H) 08/20/2018   CALCIUM 9.3 08/20/2018   MG 2.2 05/22/2018   Lab Results  Component Value Date   INR 0.99 04/12/2012   No results found for: CHOL, HDL, LDLCALC, TRIG   GEN- The patient is well appearing, alert and oriented x 3 today.   Head- normocephalic, atraumatic Eyes-  Sclera clear, conjunctiva pink Ears- hearing intact Oropharynx- clear Neck- supple, no JVP Lymph- no cervical lymphadenopathy Lungs- Clear to ausculation bilaterally, normal work of breathing Heart- irregular rate and rhythm, no murmurs, rubs or gallops, PMI not laterally displaced GI- soft, NT, ND, + BS Extremities- no clubbing, cyanosis, or edema MS- no significant deformity or atrophy Skin- no rash or lesion Psych- euthymic mood, full affect Neuro- strength and sensation are intact  EKG- S brady at 56 bpm with first degree AV block, pr int 228 ms, qrs int 132 ms, qtc 461 ms Epic records reviewed Echo-05/2017-Study Conclusions  - Left ventricle: The cavity size was normal. Wall thickness was   normal. Systolic function was normal. The estimated ejection   fraction was in the range of 60% to 65%. Wall motion was normal;   there were no regional wall motion abnormalities. Left   ventricular diastolic function parameters were normal. - Aortic valve: Mildly to moderately calcified  annulus. Trileaflet.   There was mild regurgitation. - Mitral valve: Calcified annulus. Normal thickness leaflets .   There was moderate regurgitation. - Tricuspid valve: There was mild regurgitation. - Pulmonary arteries: PA peak pressure: 34 mm Hg (S).   Assessment and Plan: 1.  Afib/flutter S/p ablation 1/17 Has had some afib post procedure, did not tolerate multaq or  100 mg flecainide bid but seems to be tolerating  flecainide 50 mg bid better Will continue metoprolol  ER 25 mg 1/2 tab daily, can take an extra 1/2 tab of BB if breakthrough afib  2. Am hypotension States feels weak in the am with BP's around 90-100 systolic but improves in the pm Move amlodipine to lunch and metoprolol to dinner time, taking both in the am now  3. CHA2DS2VASc score of at least 4 Continue eliquis 5 mg bid  4. Pedal edema Her weight is increased by 8 lbs No shortness of breath Hopefully will improve with lower doses of flecainide Discussed using a few days of low dose furosemide For now she wants to watch  F/u with Dr. Elberta Fortisamnitz 4/14  Lupita Leashonna C. Matthew Folksarroll, ANP-C [facility] [address] [place], [place] [address] [phone]

## 2018-09-16 NOTE — Patient Instructions (Signed)
Move norvasc at lunch and metoprolol to supper.   For heart racing may take extra 1/2 tablet of metoprolol for heart rate over 100. Make sure the top number of blood pressure is above 100 before taking extra metoprolol

## 2018-09-29 ENCOUNTER — Other Ambulatory Visit (HOSPITAL_COMMUNITY): Payer: Self-pay | Admitting: Nurse Practitioner

## 2018-10-07 ENCOUNTER — Telehealth: Payer: Self-pay | Admitting: Cardiology

## 2018-10-07 DIAGNOSIS — R1031 Right lower quadrant pain: Secondary | ICD-10-CM

## 2018-10-07 NOTE — Telephone Encounter (Signed)
New Message:    Patient had a small surgery that is still hurting. Please call patient back

## 2018-10-07 NOTE — Telephone Encounter (Signed)
Spoke to the patient who had an ablation on 1/17.  Since then she has had pain in her R groin area.  It does not hurt to push on the site, no bruising or swelling.  When she moves, she feels a "grabbing,sharp" pain. At rest, she feels a mild "achy,stinging" pain.    It has gotten slightly better over time, but she figured that it would have been much better by now.  She said that she cannot wear underwear, because the elastic strap hurts the area.  Please advise, thank you.

## 2018-10-08 NOTE — Telephone Encounter (Signed)
Reviewed by [employer]. Pt informed that we would order an ultrasound to evaluate groin and r/o DVT. Patient verbalized understanding and agreeable to plan.  She understands office will call her next week to schedule.

## 2018-10-11 ENCOUNTER — Ambulatory Visit (HOSPITAL_COMMUNITY)
Admission: RE | Admit: 2018-10-11 | Discharge: 2018-10-11 | Disposition: A | Discharge status: Home / Self Care | Payer: Medicare Other | Source: Ambulatory Visit | Attending: Cardiovascular Disease | Admitting: Cardiovascular Disease

## 2018-10-11 ENCOUNTER — Other Ambulatory Visit: Payer: Self-pay | Admitting: Cardiology

## 2018-10-11 DIAGNOSIS — R1031 Right lower quadrant pain: Secondary | ICD-10-CM

## 2018-11-01 ENCOUNTER — Other Ambulatory Visit (HOSPITAL_COMMUNITY): Payer: Self-pay | Admitting: Nurse Practitioner

## 2018-11-03 ENCOUNTER — Other Ambulatory Visit: Payer: Self-pay | Admitting: Cardiology

## 2018-11-05 ENCOUNTER — Telehealth: Payer: Self-pay | Admitting: *Deleted

## 2018-11-05 NOTE — Telephone Encounter (Signed)
Called patient to let them know due to recent COVID19 CDC and Health Department Protocols, we are not seeing patients in the office. We are instead seeing if they would like to schedule this appointment as a WebEx Virtual Appointment VIA Smartphone or Laptop. Unable to reach patient. LVMTCB  

## 2018-11-05 NOTE — Telephone Encounter (Signed)
Follow up   Patient would like to know if she can do an office visit by phone. The patient is not setup on MYChart. Please call to discuss.

## 2018-11-05 NOTE — Telephone Encounter (Signed)
New Message   Patient returning phone call to find out more about setting up the Virtual visit.

## 2018-11-05 NOTE — Telephone Encounter (Signed)
Follow up  ° ° °Pt is returning call  ° ° °Please call back  °

## 2018-11-08 NOTE — Telephone Encounter (Signed)
Follow Up: ° ° ° ° ° °Returning your call from Friday. °

## 2018-11-08 NOTE — Telephone Encounter (Signed)
Follow Up     Pt is retuning call  Please call back

## 2018-11-09 NOTE — Telephone Encounter (Signed)
Follow Up:  PT was on the phone with Tarin on Friday and her phone cut out. She tried to call back yesterday and no one called her back. Please call he home line. She does not have good cell service at home

## 2018-11-09 NOTE — Telephone Encounter (Signed)
Called patient to let them know due to recent COVID19 CDC and Health Department Protocols, we are not seeing patients in the office. We are instead seeing if they would like to schedule this appointment as a Pension scheme manager or Laptop. Patient is aware if they decide to reschedule this appointment, they may not be seen or scheduled for the next 4-6 months. Patient at this time declines WebEx Virtual Visits.  Patient does not have computer, internet, or smart phone access. Message sent scheduling and nurse.  Patient asked if she could have a telephone visit like she has had with another provider.  She recently gave up her phone and computer because she could not use them.

## 2018-11-16 ENCOUNTER — Telehealth: Payer: Self-pay | Admitting: Cardiology

## 2018-11-16 ENCOUNTER — Ambulatory Visit: Payer: Medicare Other | Admitting: Cardiology

## 2018-11-16 NOTE — Telephone Encounter (Signed)
New Message    Pt is calling because she would like for the virtual visit appt to be done over her home telephone number because she doesn't have good signal at her house   Please call

## 2018-11-16 NOTE — Telephone Encounter (Signed)
Virtual Visit Pre-Appointment Phone Call  Steps For Call:  1. Confirm consent - "In the setting of the current Covid19 crisis, you are scheduled for a (phone or video) visit with your provider on (date) at (time).  Just as we do with many in-office visits, in order for you to participate in this visit, we must obtain consent.  If you'd like, I can send this to your mychart (if signed up) or email for you to review.  Otherwise, I can obtain your verbal consent now.  All virtual visits are billed to your insurance company just like a normal visit would be.  By agreeing to a virtual visit, we'd like you to understand that the technology does not allow for your provider to perform an examination, and thus may limit your provider's ability to fully assess your condition.  Finally, though the technology is pretty good, we cannot assure that it will always work on either your or our end, and in the setting of a video visit, we may have to convert it to a phone-only visit.  In either situation, we cannot ensure that we have a secure connection.  Are you willing to proceed?" STAFF: Did the patient verbally acknowledge consent to treatment? YES  2. Give patient instructions for WebEx/MyChart download to smartphone or Doximity/Doxy.me if video visit (depending on what platform provider is using)  3. Advise patient to be prepared with any vital sign or heart rhythm information, their current medicines, and a piece of paper and pen handy for any instructions they may receive the day of their visit  4. Inform patient they will receive a phone call 15 minutes prior to their appointment time (may be from unknown caller ID) so they should be prepared to answer  5. Confirm that appointment type is correct in Epic appointment notes (video vs telephone)    TELEPHONE CALL NOTE  Hailey [name] has been deemed a candidate for a follow-up tele-health visit to limit community exposure during the Covid-19 pandemic. I  spoke with the patient via phone to ensure availability of phone/video source, confirm preferred email & phone number, and discuss instructions and expectations.  I reminded Hailey [name] to be prepared with any vital sign and/or heart rhythm information that could potentially be obtained via home monitoring, at the time of her visit. I reminded Hailey [name] to expect a phone call at the time of her visit if her visit.  Baird LyonsPrice, Lincoln Kleiner L, RN 11/16/2018 9:44 AM   DOWNLOADING THE WEBEX SOFTWARE TO SMARTPHONE  - If Apple, go to [employer] Store and type in WebEx in the search bar. Download [employer] Meetings, the blue/green circle. The app is free but as with any other app downloads, their phone may require them to verify saved payment information or Apple password. The patient does NOT have to create an account.  - If Android, ask patient to go to [employer] Play Store and type in WebEx in the search bar. Download [employer] Meetings, the blue/green circle. The app is free but as with any other app downloads, their phone may require them to verify saved payment information or Android password. The patient does NOT have to create an account.   CONSENT FOR TELE-HEALTH VISIT - PLEASE REVIEW  I hereby voluntarily request, consent and authorize [facility] and its employed or contracted physicians, physician assistants, nurse practitioners or other licensed health care professionals (the Practitioner), to provide me with telemedicine health care services (the "Services")  as deemed necessary by the treating Practitioner. I acknowledge and consent to receive the Services by the Practitioner via telemedicine. I understand that the telemedicine visit will involve communicating with the Practitioner through live audiovisual communication technology and the disclosure of certain medical information by electronic transmission. I acknowledge that I have been given the opportunity to request an in-person assessment or  other available alternative prior to the telemedicine visit and am voluntarily participating in the telemedicine visit.  I understand that I have the right to withhold or withdraw my consent to the use of telemedicine in the course of my care at any time, without affecting my right to future care or treatment, and that the Practitioner or I may terminate the telemedicine visit at any time. I understand that I have the right to inspect all information obtained and/or recorded in the course of the telemedicine visit and may receive copies of available information for a reasonable fee.  I understand that some of the potential risks of receiving the Services via telemedicine include:  Marland Kitchen Delay or interruption in medical evaluation due to technological equipment failure or disruption; . Information transmitted may not be sufficient (e.g. poor resolution of images) to allow for appropriate medical decision making by the Practitioner; and/or  . In rare instances, security protocols could fail, causing a breach of personal health information.  Furthermore, I acknowledge that it is my responsibility to provide information about my medical history, conditions and care that is complete and accurate to the best of my ability. I acknowledge that Practitioner's advice, recommendations, and/or decision may be based on factors not within their control, such as incomplete or inaccurate data provided by me or distortions of diagnostic images or specimens that may result from electronic transmissions. I understand that the practice of medicine is not an exact science and that Practitioner makes no warranties or guarantees regarding treatment outcomes. I acknowledge that I will receive a copy of this consent concurrently upon execution via email to the email address I last provided but may also request a printed copy by calling the office of [facility].    I understand that my insurance will be billed for this visit.   I  have read or had this consent read to me. . I understand the contents of this consent, which adequately explains the benefits and risks of the Services being provided via telemedicine.  . I have been provided ample opportunity to ask questions regarding this consent and the Services and have had my questions answered to my satisfaction. . I give my informed consent for the services to be provided through the use of telemedicine in my medical care  By participating in this telemedicine visit I agree to the above.      Called to discuss tomorrow's phone call scheduled with Dr. Elberta Fortis.   Pt agreeable.

## 2018-11-16 NOTE — Telephone Encounter (Signed)
Returned pts call and made pt aware that we will change her phone # to call in her appt notes so that Dr. Elberta Fortis and his team will know what number to call. Pt thanked me for the call back.

## 2018-11-17 ENCOUNTER — Other Ambulatory Visit: Payer: Self-pay

## 2018-11-17 ENCOUNTER — Encounter: Payer: Self-pay | Admitting: Cardiology

## 2018-11-17 ENCOUNTER — Telehealth (INDEPENDENT_AMBULATORY_CARE_PROVIDER_SITE_OTHER): Payer: Medicare Other | Admitting: Cardiology

## 2018-11-17 DIAGNOSIS — I4819 Other persistent atrial fibrillation: Secondary | ICD-10-CM

## 2018-11-17 NOTE — Progress Notes (Signed)
Electrophysiology TeleHealth Note   Due to national recommendations of social distancing due to COVID 19, an audio/video telehealth visit is felt to be most appropriate for this patient at this time.  See epic message for the patient's consent to telehealth for [facility].   Date:  11/17/2018   ID:  Hailey [name], DOB 1940-10-26, MRN [id number]  Location: patient's home  Provider location: [address] [place]  Evaluation Performed: Follow-up visit  PCP:  Richardean Chimera, MD  Cardiologist:  Prentice Docker, MD  Electrophysiologist:  Dr Elberta Fortis  Chief Complaint: Atrial fibrillation  History of Present Illness:    Hailey [name] is a 78 y.o. female who presents via audio/video conferencing for a telehealth visit today.  Since last being seen in our clinic, the patient reports doing very well.  Today, she denies symptoms of palpitations, chest pain, shortness of breath,  lower extremity edema, dizziness, presyncope, or syncope.  The patient is otherwise without complaint today.  The patient denies symptoms of fevers, chills, cough, or new SOB worrisome for COVID 19.  She has a history of atrial fibrillation.  She is on Multaq.  She had an atrial fibrillation ablation 08/20/2018.  Today, denies symptoms of chest pain, shortness of breath, orthopnea, PND, lower extremity edema, claudication, dizziness, presyncope, syncope, bleeding, or neurologic sequela. The patient is tolerating medications without difficulties.  She does continue to have episodic palpitations.  Her palpitations occur mainly in the morning.  She is felt well up until this morning when she woke up and felt quite ill and weak.  She laid down on the couch and after approximately an hour, she felt quite a bit better.  This was associated with palpitations.  Past Medical History:  Diagnosis Date   Atrial fibrillation (HCC)    Breast nodule 11/16/2013   H/O hematuria    HLD (hyperlipidemia)     Hypertension    Meniere's disease    ringing in the ears   Migraine headache    Palpitations    Restless leg syndrome    Vaginal atrophy     Past Surgical History:  Procedure Laterality Date   APPENDECTOMY     ATRIAL FIBRILLATION ABLATION N/A 08/20/2018   Procedure: ATRIAL FIBRILLATION ABLATION;  Surgeon: Regan Lemming, MD;  Location: [facility];  Service: Cardiovascular;  Laterality: N/A;   CARDIAC CATHETERIZATION  2005    cone   COLONOSCOPY     HERNIA REPAIR     LEFT HEART CATHETERIZATION WITH CORONARY ANGIOGRAM N/A 04/12/2012   Procedure: LEFT HEART CATHETERIZATION WITH CORONARY ANGIOGRAM;  Surgeon: Kathleene Hazel, MD;  Location: [facility] CATH LAB;  Service: Cardiovascular;  Laterality: N/A;   TOTAL ABDOMINAL HYSTERECTOMY      Current Outpatient Medications  Medication Sig Dispense Refill   acetaminophen (TYLENOL) 650 MG CR tablet Take 650-1,300 mg by mouth 2 (two) times daily as needed (pain.).      amLODipine (NORVASC) 2.5 MG tablet Take 1 tablet (2.5 mg total) by mouth daily. 30 tablet 6   apixaban (ELIQUIS) 5 MG TABS tablet Take 5 mg by mouth 2 (two) times daily.     Calcium Carb-Cholecalciferol (CALCIUM 600+D3 PO) Take 1 tablet by mouth daily.     flecainide (TAMBOCOR) 100 MG tablet TAKE 1 TABLET BY MOUTH TWICE A DAY 60 tablet 2   flecainide (TAMBOCOR) 50 MG tablet Take 1 tablet (50 mg total) by mouth 2 (two) times daily. 60 tablet 3  fluticasone (FLONASE) 50 MCG/ACT nasal spray Place 2 sprays into both nostrils daily as needed for allergies or rhinitis.     gabapentin (NEURONTIN) 100 MG capsule Take 100 mg by mouth at bedtime.     HYDROcodone-acetaminophen (NORCO/VICODIN) 5-325 MG tablet Take 1 tablet by mouth every 6 (six) hours as needed. 6 tablet 0   loratadine (CLARITIN) 10 MG tablet Take 10 mg by mouth daily as needed for allergies.     meclizine (ANTIVERT) 25 MG tablet Take 25 mg by mouth 2 (two) times daily as needed for  dizziness.      metoprolol succinate (TOPROL XL) 25 MG 24 hr tablet Take 0.5 tablets (12.5 mg total) by mouth daily. 45 tablet 1   Multiple Vitamin (MULTIVITAMIN WITH MINERALS) TABS tablet Take 1 tablet by mouth at bedtime. Centrum Silver for Women     omeprazole (PRILOSEC) 20 MG capsule Take 1 capsule (20 mg total) by mouth daily. 30 capsule 0   ondansetron (ZOFRAN) 4 MG tablet Take 1 tablet (4 mg total) by mouth every 6 (six) hours as needed. 20 tablet 0   clonazepam (KLONOPIN) 0.125 MG disintegrating tablet Take 0.125 mg by mouth at bedtime.  5   No current facility-administered medications for this visit.     Allergies:   Amiodarone and Flomax [tamsulosin hcl]   Social History:  The patient  reports that she has never smoked. She has never used smokeless tobacco. She reports current alcohol use. She reports that she does not use drugs.   Family History:  The patient's  family history includes Coronary artery disease in her father; Heart attack in her brother; Heart disease in her brother; Hypertension in her brother and mother; Other in her brother, sister, and sister; Stroke in her brother and brother; Transient ischemic attack in her brother.   ROS:  Please see the history of present illness.   All other systems are personally reviewed and negative.    Exam:    Vital Signs:  BP 102/60    Pulse (!) 53    Wt 195 lb (88.5 kg)    BMI 30.54 kg/m   Over the phone, no acute distress, no shortness of breath   Labs/Other Tests and Data Reviewed:    Recent Labs: 04/24/2018: ALT 21 05/22/2018: Magnesium 2.2; TSH 5.486 07/26/2018: Hemoglobin 12.9; Platelets 247 08/20/2018: BUN 17; Creatinine, Ser 1.77; Potassium 4.8; Sodium 141   Wt Readings from Last 3 Encounters:  11/17/18 195 lb (88.5 kg)  09/16/18 198 lb 9.6 oz (90.1 kg)  09/02/18 190 lb (86.2 kg)     Other studies personally reviewed: Additional studies/ records that were reviewed today include: ECG 09/16/2018 personally  reviewed Review of the above records today demonstrates:   Sinus rhythm, right bundle branch block, first-degree AV block   ASSESSMENT & PLAN:    1.  Persistent atrial fibrillation: Status post ablation 08/20/2018.  Currently on Eliquis and flecainide.  It does appear that she is having potentially some atrial fibrillation in the mornings.  Due to that, we Taylormarie Register plan to send her a cardiac monitor.  She Sacha Radloff wear this for 14 days and send it back.  If she is having atrial fibrillation, she may benefit from a medication change.  This patients CHA2DS2-VASc Score and unadjusted Ischemic Stroke Rate (% per year) is equal to 4.8 % stroke rate/year from a score of 4  Above score calculated as 1 point each if present [CHF, HTN, DM, Vascular=MI/PAD/Aortic Plaque, Age if 5065-74, or  Female] Above score calculated as 2 points each if present [Age > 75, or Stroke/TIA/TE]  2.  Hypertension: Currently well controlled.  No changes.   COVID 19 screen The patient denies symptoms of COVID 19 at this time.  The importance of social distancing was discussed today.  Follow-up: 3 months  Current medicines are reviewed at length with the patient today.   The patient does not have concerns regarding her medicines.  The following changes were made today:  none  Labs/ tests ordered today include: 14-day monitor No orders of the defined types were placed in this encounter.  This patient does not have a computer nor does she have a smart phone.  This visit was thus a telephone visit  Patient Risk:  after full review of this patients clinical status, I feel that they are at moderate risk at this time.  Today, I have spent 15 minutes with the patient with telehealth technology discussing atrial fibrillation.    Signed, Zaccheaus Storlie Jorja Loa, MD  11/17/2018 1:25 PM     [facility] [address] [place] [address] [phone] (office) [phone] (fax)

## 2018-11-18 ENCOUNTER — Telehealth: Payer: Self-pay | Admitting: *Deleted

## 2018-11-18 NOTE — Addendum Note (Signed)
Addended by: Baird Lyons on: 11/18/2018 11:25 AM   Modules accepted: Orders

## 2018-11-18 NOTE — Telephone Encounter (Signed)
Patient enrolled for [employer] to ship a 14 day Zio Xt long term holter monitor to her home.  Instructions reviewed briefly as the are included in the kit.  Patient informed her results should be available approximately 5 days after the monitor mailed back to [employer] in prepaid package included in kit.

## 2018-11-22 ENCOUNTER — Other Ambulatory Visit (INDEPENDENT_AMBULATORY_CARE_PROVIDER_SITE_OTHER): Payer: Medicare Other

## 2018-11-22 DIAGNOSIS — I4819 Other persistent atrial fibrillation: Secondary | ICD-10-CM

## 2018-12-13 ENCOUNTER — Other Ambulatory Visit: Payer: Self-pay

## 2018-12-14 ENCOUNTER — Telehealth: Payer: Self-pay | Admitting: Cardiology

## 2018-12-14 NOTE — Telephone Encounter (Signed)
Advised to stop Flecainide for now and see if improvement after stopping.  Pt will monitor HR/BP and call back if HR goes above 100. Pt will let me know what surgeons office says about if this issue is related to Mohs surgery she had post ablation. Pt understands I will follow up next week to see if improved or not. She will call the office in the interim if issues arise with AFib.

## 2018-12-14 NOTE — Telephone Encounter (Signed)
Follow Up:    Pt called back. She wanted you to know she is going to contact her surgeon who did her MOHS surgery. She wants to check and make sure that this might not be some side effect from the surgery. She said she would get back to you and let you know what he said.

## 2018-12-14 NOTE — Telephone Encounter (Signed)
New message;    Patient calling she is concerning that she is having AFIB. Patient states she is inching,  Stinging, skin red, and burning some. Patient would like for some one to call back.

## 2018-12-14 NOTE — Telephone Encounter (Signed)
Follow Up:    Pt wanted you to know that she has an appt tomorrow at 4:00  with her surgeon. The nurse said they had not heard of this before,but they will check it out to make sure.

## 2018-12-14 NOTE — Telephone Encounter (Signed)
Pt reports that she had "an episode last night".  She woke up with heart racing, got hot, face flushed/red, with a burning/stinging sensation. States that it feels like "a chicken scratching". States that her PCP advised Claritin for reddening/itching - this didn't work.  She then started med for rosacea - that didn't work. States she thinks facial redness/swelling started about the time of Flecainide, but she isn't sure. Her "face stays swollen and red.  It burns and stings".  She also reports feeling wiped out this morning but is back in rhythm.  But still having Afib post ablation.  Pt made aware I would forward this to PhD to review &  advise if Eliquis, Toprol and/or Flecainide could be causing her issue.  Explained that after PhD advisement I would further discuss w/ Camnitz if virtual appt needed to further discuss options/changes.  Pt agreeable to plan.

## 2018-12-14 NOTE — Telephone Encounter (Signed)
Flecainide has a 1-3% reported incidence of causing rash and metoprolol has a 2-5% reported incidence of causing itchiness and rash - either of these might be contributing to her symptoms. If her symptom onset correlated more with initiation of flecainide, it might make more sense to try a trial off of flecainide first.

## 2018-12-16 NOTE — Telephone Encounter (Addendum)
Called pt to inform her of monitor results. She tells me that she saw dermatologist yesterday.  They have "diagnosed Rosacea and it is inflamed".  They started her on Doxycycline. Pt will continue to hold Flecainide and we will follow up next week to see if she continues to improve.  She tells me that she can already tell a difference today. Pt agreeable to follow up next week.

## 2018-12-24 NOTE — Telephone Encounter (Signed)
Pt doing much better, redness is improving. The abx dermatology placed her on is working. Pt advised to restart Flecainide. Pt states she has been noticing fluttering in her chest since stopping Flecainide. Pt will call back if improvement not made after restarting. She already has f/u scheduled for July. Patient verbalized understanding and agreeable to plan.

## 2019-01-10 ENCOUNTER — Other Ambulatory Visit (HOSPITAL_COMMUNITY): Payer: Self-pay | Admitting: Obstetrics and Gynecology

## 2019-01-10 DIAGNOSIS — Z1231 Encounter for screening mammogram for malignant neoplasm of breast: Secondary | ICD-10-CM

## 2019-01-23 ENCOUNTER — Other Ambulatory Visit: Payer: Self-pay | Admitting: Cardiology

## 2019-01-27 ENCOUNTER — Other Ambulatory Visit: Payer: Self-pay | Admitting: Cardiology

## 2019-02-01 ENCOUNTER — Ambulatory Visit (HOSPITAL_COMMUNITY): Payer: Medicare Other

## 2019-02-02 ENCOUNTER — Ambulatory Visit (HOSPITAL_COMMUNITY)
Admission: RE | Admit: 2019-02-02 | Discharge: 2019-02-02 | Disposition: A | Discharge status: Home / Self Care | Payer: Medicare Other | Source: Ambulatory Visit | Attending: Obstetrics and Gynecology | Admitting: Obstetrics and Gynecology

## 2019-02-02 ENCOUNTER — Other Ambulatory Visit: Payer: Self-pay

## 2019-02-02 DIAGNOSIS — Z1231 Encounter for screening mammogram for malignant neoplasm of breast: Secondary | ICD-10-CM | POA: Insufficient documentation

## 2019-02-16 ENCOUNTER — Telehealth: Payer: Self-pay | Admitting: Cardiology

## 2019-02-16 NOTE — Telephone Encounter (Signed)
New Message ° ° ° °Left message to confirm appt and answer covid questions  °

## 2019-02-17 ENCOUNTER — Telehealth: Payer: Self-pay | Admitting: Cardiology

## 2019-02-17 NOTE — Telephone Encounter (Signed)
New message: ° ° ° °Patient returning your call back. °

## 2019-02-17 NOTE — Telephone Encounter (Signed)

## 2019-02-18 ENCOUNTER — Other Ambulatory Visit: Payer: Self-pay

## 2019-02-18 ENCOUNTER — Ambulatory Visit (INDEPENDENT_AMBULATORY_CARE_PROVIDER_SITE_OTHER): Payer: Medicare Other | Admitting: Cardiology

## 2019-02-18 ENCOUNTER — Encounter: Payer: Self-pay | Admitting: Cardiology

## 2019-02-18 VITALS — BP 122/64 | HR 57 | Ht 67.0 in | Wt 190.0 lb

## 2019-02-18 DIAGNOSIS — I480 Paroxysmal atrial fibrillation: Secondary | ICD-10-CM

## 2019-02-18 NOTE — Progress Notes (Signed)
Electrophysiology Office Note   Date:  02/18/2019   ID:  KEYLIN FERRYMAN, DOB 03-12-1941, MRN [id number]  PCP:  Caryl Bis, MD  Cardiologist: Bronson Ing Primary Electrophysiologist:  Manon Banbury Meredith Leeds, MD    No chief complaint on file.    History of Present Illness: Hailey [name] is a 78 y.o. female who is being seen today for the evaluation of atrial fibrillation at the request of Caryl Bis, MD. Presenting today for electrophysiology evaluation.  She has a history of atrial fibrillation on amiodarone.  She was admitted to the hospital with dizziness, nausea, weakness, and near syncope.  She was noted to be in sinus bradycardia with heart rates in the 40s to 50s.  Her home Lopressor was stopped which improved her symptoms.  She is now status post atrial fibrillation ablation.  Today, denies symptoms of palpitations, chest pain, shortness of breath, orthopnea, PND, lower extremity edema, claudication, dizziness, presyncope, syncope, bleeding, or neurologic sequela. The patient is tolerating medications without difficulties.  She is currently feeling well.  She has no chest pain or shortness of breath.  She does have some abdominal versus chest discomfort when she wakes up in the mornings.  She feels that it is due to her hiatal hernia.  Otherwise she is doing well.    Past Medical History:  Diagnosis Date  . Atrial fibrillation ([place])   . Breast nodule 11/16/2013  . H/O hematuria   . HLD (hyperlipidemia)   . Hypertension   . 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 Migraine headache   . Palpitations   . Restless leg syndrome   . Vaginal atrophy    Past Surgical History:  Procedure Laterality Date  . APPENDECTOMY    . ATRIAL FIBRILLATION ABLATION N/A 08/20/2018   Procedure: ATRIAL FIBRILLATION ABLATION;  Surgeon: Constance Haw, MD;  Location: [place] CV LAB;  Service: Cardiovascular;  Laterality: N/A;  . CARDIAC CATHETERIZATION  2005    cone  .  COLONOSCOPY    . HERNIA REPAIR    . LEFT HEART CATHETERIZATION WITH CORONARY ANGIOGRAM N/A 04/12/2012   Procedure: LEFT HEART CATHETERIZATION WITH CORONARY ANGIOGRAM;  Surgeon: Burnell Blanks, MD;  Location: [facility] CATH LAB;  Service: Cardiovascular;  Laterality: N/A;  . TOTAL ABDOMINAL HYSTERECTOMY       Current Outpatient Medications  Medication Sig Dispense Refill  . acetaminophen (TYLENOL) 650 MG CR tablet Take 650-1,300 mg by mouth 2 (two) times daily as needed (pain.).     Marland Kitchen amLODipine (NORVASC) 2.5 MG tablet Take 1 tablet (2.5 mg total) by mouth daily. 30 tablet 6  . apixaban (ELIQUIS) 5 MG TABS tablet Take 5 mg by mouth 2 (two) times daily.    . Calcium Carb-Cholecalciferol (CALCIUM 600+D3 PO) Take 1 tablet by mouth daily.    . clonazepam (KLONOPIN) 0.125 MG disintegrating tablet Take 0.125 mg by mouth at bedtime.  5  . flecainide (TAMBOCOR) 50 MG tablet Take 1 tablet (50 mg total) by mouth 2 (two) times daily. 60 tablet 3  . fluticasone (FLONASE) 50 MCG/ACT nasal spray Place 2 sprays into both nostrils daily as needed for allergies or rhinitis.    Marland Kitchen gabapentin (NEURONTIN) 100 MG capsule Take 100 mg by mouth at bedtime.    Marland Kitchen HYDROcodone-acetaminophen (NORCO/VICODIN) 5-325 MG tablet Take 1 tablet by mouth every 6 (six) hours as needed. 6 tablet 0  . loratadine (CLARITIN) 10 MG tablet Take 10 mg by mouth daily as  needed for allergies.    Marland Kitchen. meclizine (ANTIVERT) 25 MG tablet Take 25 mg by mouth 2 (two) times daily as needed for dizziness.     . metoprolol succinate (TOPROL-XL) 25 MG 24 hr tablet TAKE 1/2 TABLET BY MOUTH EVERY DAY 45 tablet 3  . Multiple Vitamin (MULTIVITAMIN WITH MINERALS) TABS tablet Take 1 tablet by mouth at bedtime. Centrum Silver for Women    . omeprazole (PRILOSEC) 20 MG capsule Take 1 capsule (20 mg total) by mouth daily. 30 capsule 0  . ondansetron (ZOFRAN) 4 MG tablet Take 1 tablet (4 mg total) by mouth every 6 (six) hours as needed. 20 tablet 0   No current  facility-administered medications for this visit.     Allergies:   Amiodarone and Flomax [tamsulosin hcl]   Social History:  The patient  reports that she has never smoked. She has never used smokeless tobacco. She reports current alcohol use. She reports that she does not use drugs.   Family History:  The patient's family history includes Coronary artery disease in her father; Heart attack in her brother; Heart disease in her brother; Hypertension in her brother and mother; Other in her brother, sister, and sister; Stroke in her brother and brother; Transient ischemic attack in her brother.   ROS:  Please see the history of present illness.   Otherwise, review of systems is positive for none.   All other systems are reviewed and negative.   PHYSICAL EXAM: VS:  BP 122/64   Pulse (!) 57   Ht 5\' 7"  (1.702 m)   Wt 190 lb (86.2 kg)   BMI 29.76 kg/m  , BMI Body mass index is 29.76 kg/m. GEN: Well nourished, well developed, in no acute distress  HEENT: normal  Neck: no JVD, carotid bruits, or masses Cardiac: RRR; no murmurs, rubs, or gallops,no edema  Respiratory:  clear to auscultation bilaterally, normal work of breathing GI: soft, nontender, nondistended, + BS MS: no deformity or atrophy  Skin: warm and dry Neuro:  Strength and sensation are intact Psych: euthymic mood, full affect  EKG:  EKG is ordered today. Personal review of the ekg ordered shows sinus rhythm, low voltage   Recent Labs: 04/24/2018: ALT 21 05/22/2018: Magnesium 2.2; TSH 5.486 07/26/2018: Hemoglobin 12.9; Platelets 247 08/20/2018: BUN 17; Creatinine, Ser 1.77; Potassium 4.8; Sodium 141    Lipid Panel  No results found for: CHOL, TRIG, HDL, CHOLHDL, VLDL, LDLCALC, LDLDIRECT   Wt Readings from Last 3 Encounters:  02/18/19 190 lb (86.2 kg)  11/17/18 195 lb (88.5 kg)  09/16/18 198 lb 9.6 oz (90.1 kg)      Other studies Reviewed: Additional studies/ records that were reviewed today include: TTE 05/20/2017  Review of the above records today demonstrates:  - Left ventricle: The cavity size was normal. Wall thickness was   normal. Systolic function was normal. The estimated ejection   fraction was in the range of 60% to 65%. Wall motion was normal;   there were no regional wall motion abnormalities. Left   ventricular diastolic function parameters were normal. - Aortic valve: Mildly to moderately calcified annulus. Trileaflet.   There was mild regurgitation. - Mitral valve: Calcified annulus. Normal thickness leaflets .   There was moderate regurgitation. - Tricuspid valve: There was mild regurgitation. - Pulmonary arteries: PA peak pressure: 34 mm Hg (S).   ASSESSMENT AND PLAN:  1.  Persistent atrial fibrillation: Currently in sinus rhythm on flecainide and Eliquis.  Status post ablation 08/20/2018.  Feeling well without complaint.  No changes.    2.  Hypertension: Currently well controlled.  No changes.  Current medicines are reviewed at length with the patient today.   The patient does not have concerns regarding her medicines.  The following changes were made today: None  Labs/ tests ordered today include:  No orders of the defined types were placed in this encounter.    Disposition:   FU with Avi Kerschner 6 months  Signed, Sukanya Goldblatt Jorja LoaMartin Zaahir Pickney, MD  02/18/2019 9:48 AM     [facility] [address] [place] [address] [phone] (office) [phone] (fax)

## 2019-04-29 ENCOUNTER — Encounter (HOSPITAL_COMMUNITY): Payer: Self-pay

## 2019-04-29 ENCOUNTER — Other Ambulatory Visit: Payer: Self-pay

## 2019-04-29 ENCOUNTER — Emergency Department (HOSPITAL_COMMUNITY)
Admission: EM | Admit: 2019-04-29 | Discharge: 2019-04-29 | Disposition: A | Discharge status: Home / Self Care | Payer: Medicare Other | Source: Home / Self Care | Attending: Emergency Medicine | Admitting: Emergency Medicine

## 2019-04-29 ENCOUNTER — Emergency Department (HOSPITAL_COMMUNITY): Payer: Medicare Other

## 2019-04-29 DIAGNOSIS — I4819 Other persistent atrial fibrillation: Secondary | ICD-10-CM | POA: Diagnosis not present

## 2019-04-29 DIAGNOSIS — Z7901 Long term (current) use of anticoagulants: Secondary | ICD-10-CM | POA: Insufficient documentation

## 2019-04-29 DIAGNOSIS — I10 Essential (primary) hypertension: Secondary | ICD-10-CM | POA: Insufficient documentation

## 2019-04-29 DIAGNOSIS — R0602 Shortness of breath: Secondary | ICD-10-CM | POA: Diagnosis not present

## 2019-04-29 DIAGNOSIS — I4891 Unspecified atrial fibrillation: Secondary | ICD-10-CM | POA: Insufficient documentation

## 2019-04-29 DIAGNOSIS — Z959 Presence of cardiac and vascular implant and graft, unspecified: Secondary | ICD-10-CM | POA: Insufficient documentation

## 2019-04-29 DIAGNOSIS — R5383 Other fatigue: Secondary | ICD-10-CM | POA: Insufficient documentation

## 2019-04-29 LAB — BASIC METABOLIC PANEL
Anion gap: 7 (ref 5–15)
BUN: 17 mg/dL (ref 8–23)
CO2: 28 mmol/L (ref 22–32)
Calcium: 9.3 mg/dL (ref 8.9–10.3)
Chloride: 107 mmol/L (ref 98–111)
Creatinine, Ser: 1.55 mg/dL — ABNORMAL HIGH (ref 0.44–1.00)
GFR calc Af Amer: 37 mL/min — ABNORMAL LOW (ref >60)
GFR calc non Af Amer: 32 mL/min — ABNORMAL LOW (ref >60)
Glucose, Bld: 133 mg/dL — ABNORMAL HIGH (ref 70–99)
Potassium: 4.1 mmol/L (ref 3.5–5.1)
Sodium: 142 mmol/L (ref 135–145)

## 2019-04-29 LAB — URINALYSIS, ROUTINE W REFLEX MICROSCOPIC
Bilirubin Urine: NEGATIVE
Glucose, UA: NEGATIVE mg/dL
Hgb urine dipstick: NEGATIVE
Ketones, ur: NEGATIVE mg/dL
Nitrite: NEGATIVE
Protein, ur: NEGATIVE mg/dL
Specific Gravity, Urine: 1.005 (ref 1.005–1.030)
pH: 6 (ref 5.0–8.0)

## 2019-04-29 LAB — CBC
HCT: 44.9 % (ref 36.0–46.0)
Hemoglobin: 14.3 g/dL (ref 12.0–15.0)
MCH: 33.1 pg (ref 26.0–34.0)
MCHC: 31.8 g/dL (ref 30.0–36.0)
MCV: 103.9 fL — ABNORMAL HIGH (ref 80.0–100.0)
Platelets: 266 10*3/uL (ref 150–400)
RBC: 4.32 MIL/uL (ref 3.87–5.11)
RDW: 13.8 % (ref 11.5–15.5)
WBC: 6.4 10*3/uL (ref 4.0–10.5)
nRBC: 0 % (ref 0.0–0.2)

## 2019-04-29 LAB — APTT: aPTT: 30 seconds (ref 24–36)

## 2019-04-29 LAB — TROPONIN I (HIGH SENSITIVITY)
Troponin I (High Sensitivity): 12 ng/L (ref <18)
Troponin I (High Sensitivity): 11 ng/L (ref <18)

## 2019-04-29 LAB — PROTIME-INR
INR: 1.3 — ABNORMAL HIGH (ref 0.8–1.2)
Prothrombin Time: 16.4 seconds — ABNORMAL HIGH (ref 11.4–15.2)

## 2019-04-29 MED ORDER — SODIUM CHLORIDE 0.9% FLUSH: 3.0000 mL | Freq: Once | INTRAVENOUS | Status: DC

## 2019-04-29 MED ORDER — DILTIAZEM HCL ER COATED BEADS 180 MG PO CP24: 180.0000 mg | ORAL_CAPSULE | Freq: Every day | ORAL | 0 refills | Status: DC

## 2019-04-29 MED ORDER — DILTIAZEM HCL 60 MG PO TABS
60.0000 mg | ORAL_TABLET | Freq: Once | ORAL | Status: AC
  Administered 2019-04-29: 60 mg via ORAL
  Filled 2019-04-29 (×2): qty 1

## 2019-04-29 NOTE — ED Triage Notes (Signed)
Pt reports palpitations, shortness of breath and not feeling right for the past few days, hx of a fib. Pt a.o, resp e/u at this time.

## 2019-04-29 NOTE — Discharge Instructions (Addendum)
You were evaluated in the Emergency Department and after careful evaluation, we did not find any emergent condition requiring admission or further testing in the hospital.  Your symptoms seem related to your atrial fibrillation.  Please follow-up with the atrial fibrillation clinic on 9-29 at 10:30 AM.  As discussed, please add the Cardizem medication to your daily regimen.  First dose tomorrow.  Please return to the Emergency Department if you experience any worsening of your condition.  We encourage you to follow up with a primary care provider.  Thank you for allowing [place] to be a part of your care.

## 2019-04-29 NOTE — ED Notes (Signed)
Patient verbalizes understanding of discharge instructions. Opportunity for questioning and answers were provided. Armband removed by staff, pt discharged from ED ambulatory.   

## 2019-04-29 NOTE — ED Provider Notes (Signed)
[facility] Emergency Department Provider Note MRN:  [id number]  Arrival date & time: 04/29/19     Chief Complaint   Chest Pain   History of Present Illness   Hailey [name] is a 78 y.o. year-old female with a history of A. fib presenting to the ED with chief complaint of chest pain.  Patient has had general malaise and dyspnea on exertion for multiple weeks.  Much worse over the past week.  Lightheadedness and nausea when standing, malaise and dyspnea exertion with minimal ambulation.  Denies fever, no cough.  Endorsing burning-like central chest pain for the past 4 days, sometimes improves with her omeprazole.  Denies abdominal pain, no dysuria, no vomiting, no diarrhea.  Review of Systems  A complete 10 system review of systems was obtained and all systems are negative except as noted in the HPI and PMH.   Patient's Health History    Past Medical History:  Diagnosis Date  . Atrial fibrillation ([place])   . Breast nodule 11/16/2013  . H/O hematuria   . HLD (hyperlipidemia)   . Hypertension   . 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 Migraine headache   . Palpitations   . Restless leg syndrome   . Vaginal atrophy     Past Surgical History:  Procedure Laterality Date  . APPENDECTOMY    . ATRIAL FIBRILLATION ABLATION N/A 08/20/2018   Procedure: ATRIAL FIBRILLATION ABLATION;  Surgeon: Constance Haw, MD;  Location: [place] CV LAB;  Service: Cardiovascular;  Laterality: N/A;  . CARDIAC CATHETERIZATION  2005    cone  . COLONOSCOPY    . HERNIA REPAIR    . LEFT HEART CATHETERIZATION WITH CORONARY ANGIOGRAM N/A 04/12/2012   Procedure: LEFT HEART CATHETERIZATION WITH CORONARY ANGIOGRAM;  Surgeon: Burnell Blanks, MD;  Location: [facility] CATH LAB;  Service: Cardiovascular;  Laterality: N/A;  . TOTAL ABDOMINAL HYSTERECTOMY      Family History  Problem Relation Age of Onset  . Coronary artery disease Father        mid 1s  . Hypertension  Mother   . Stroke Brother   . Transient ischemic attack Brother   . Other Brother        on oxygen  . Hypertension Brother   . Stroke Brother   . Heart disease Brother   . Heart attack Brother   . Other Sister        a fib, restless leg  . Other Sister        pressure in eyes    Social History   Socioeconomic History  . Marital status: Single    Spouse name: Not on file  . Number of children: 0  . Years of education: College  . Highest education level: Not on file  Occupational History  . Occupation: retired  Scientific laboratory technician  . Financial resource strain: Not on file  . Food insecurity    Worry: Not on file    Inability: Not on file  . Transportation needs    Medical: Not on file    Non-medical: Not on file  Tobacco Use  . Smoking status: Never Smoker  . Smokeless tobacco: Never Used  Substance and Sexual Activity  . Alcohol use: Yes    Comment: rarely; glass of wine with dinner  . Drug use: No  . Sexual activity: Never    Birth control/protection: Surgical    Comment: hyst  Lifestyle  . Physical activity  Days per week: Not on file    Minutes per session: Not on file  . Stress: Not on file  Relationships  . Social Musicianconnections    Talks on phone: Not on file    Gets together: Not on file    Attends religious service: Not on file    Active member of club or organization: Not on file    Attends meetings of clubs or organizations: Not on file    Relationship status: Not on file  . Intimate partner violence    Fear of current or ex partner: Not on file    Emotionally abused: Not on file    Physically abused: Not on file    Forced sexual activity: Not on file  Other Topics Concern  . Not on file  Social History Narrative   Single. Retired.    Patient lives at home alone.   Caffeine Use: none     Physical Exam  Vital Signs and Nursing Notes reviewed Vitals:   04/29/19 1600 04/29/19 1730  BP: 138/80 (!) 160/105  Pulse: (!) 123 (!) 127  Resp: 18 20   Temp:    SpO2: 98% 99%    CONSTITUTIONAL: Well-appearing, NAD NEURO:  Alert and oriented x 3, no focal deficits EYES:  eyes equal and reactive ENT/NECK:  no LAD, no JVD CARDIO: Tachycardic rate, well-perfused, normal S1 and S2 PULM:  CTAB no wheezing or rhonchi GI/GU:  normal bowel sounds, non-distended, non-tender MSK/SPINE:  No gross deformities, no edema SKIN:  no rash, atraumatic PSYCH:  Appropriate speech and behavior  Diagnostic and Interventional Summary    EKG Interpretation  Date/Time:  Friday April 29 2019 13:34:47 EDT Ventricular Rate:  128 PR Interval:    QRS Duration: 128 QT Interval:  362 QTC Calculation: 528 R Axis:   133 Text Interpretation:  Undetermined rhythm Right bundle branch block T wave abnormality, consider inferior ischemia Abnormal ECG No acute changes No significant change since last tracing Confirmed by Derwood KaplanNanavati, Ankit [phone]([id number]) on 04/29/2019 3:41:26 PM      Labs Reviewed  BASIC METABOLIC PANEL - Abnormal; Notable for the following components:      Result Value   Glucose, Bld 133 (*)    Creatinine, Ser 1.55 (*)    GFR calc non Af Amer 32 (*)    GFR calc Af Amer 37 (*)    All other components within normal limits  CBC - Abnormal; Notable for the following components:   MCV 103.9 (*)    All other components within normal limits  PROTIME-INR - Abnormal; Notable for the following components:   Prothrombin Time 16.4 (*)    INR 1.3 (*)    All other components within normal limits  URINALYSIS, ROUTINE W REFLEX MICROSCOPIC - Abnormal; Notable for the following components:   Leukocytes,Ua MODERATE (*)    Bacteria, UA RARE (*)    All other components within normal limits  APTT  TROPONIN I (HIGH SENSITIVITY)  TROPONIN I (HIGH SENSITIVITY)    DG Chest 2 View  Final Result      Medications  sodium chloride flush (NS) 0.9 % injection 3 mL (has no administration in time range)  sodium chloride flush (NS) 0.9 % injection 3 mL (3 mLs Intravenous  Not Given 04/29/19 1606)  diltiazem (CARDIZEM) tablet 60 mg (has no administration in time range)     Procedures Critical Care  ED Course and Medical Decision Making  I have reviewed the triage vital signs and the nursing notes.  Pertinent labs & imaging results that were available during my care of the patient were reviewed by me and considered in my medical decision making (see below for details).  Atrial fibrillation with rapid ventricular response in this 78 year old female with history of A. fib.  She is anticoagulated with Eliquis but says it is possible that she has missed a dose in the past month.  Symptomatic for the past week.  Given this information, I would consider her a poor cardioversion candidate.  She also has a history of an ablation in January.  Currently with rates between 110 and 130, will consult cardiology for general recommendations.  Clinical Course as of Apr 29 1735  Fri Apr 29, 2019  1642 Discussed case with Dr. Elberta Fortis, who agrees that given the concern for possible missed dose of Eliquis she is not an ideal candidate for cardioversion in the emergency department.  She is a candidate for discharge with prompt follow-up in the clinic.  Recommending addition of 180 mg diltiazem daily to her current regimen.  Will provide 60 mg diltiazem now.  Still awaiting second troponin.   [MB]    Clinical Course User Index [MB] Sabas Sous, MD    Second troponin is also reassuring.  Given these reassuring values and her atypical pain, there is little to no concern for acute coronary syndrome.  She is ambulating in the emergency department without issue.  She continues to be mildly tachycardic, however is normotensive and otherwise well-appearing.  Cardiology will see her early next week in follow-up.  Urinalysis with mixed results, patient is without dysuria or suprapubic pain or fever, will send for culture.  Elmer Sow. Pilar Plate, MD [facility] [email or web address]  Final Clinical Impressions(s) / ED Diagnoses     ICD-10-CM   1. Atrial fibrillation with rapid ventricular response (HCC)  I48.91     ED Discharge Orders         Ordered    diltiazem (CARDIZEM CD) 180 MG 24 hr capsule  Daily     04/29/19 1735          Discharge Instructions Discussed with and Provided to Patient:   Discharge Instructions     You were evaluated in the Emergency Department and after careful evaluation, we did not find any emergent condition requiring admission or further testing in the hospital.  Your symptoms seem related to your atrial fibrillation.  Please follow-up with the atrial fibrillation clinic on 9-29 at 10:30 AM.  As discussed, please add the Cardizem medication to your daily regimen.  First dose tomorrow.  Please return to the Emergency Department if you experience any worsening of your condition.  We encourage you to follow up with a primary care provider.  Thank you for allowing [place] to be a part of your care.       Sabas Sous, MD 04/29/19 [phone]

## 2019-04-30 ENCOUNTER — Other Ambulatory Visit: Payer: Self-pay | Admitting: Cardiology

## 2019-04-30 LAB — URINE CULTURE: Culture: NO GROWTH

## 2019-05-01 ENCOUNTER — Encounter (HOSPITAL_COMMUNITY): Payer: Self-pay | Admitting: Emergency Medicine

## 2019-05-01 ENCOUNTER — Other Ambulatory Visit: Payer: Self-pay

## 2019-05-01 ENCOUNTER — Inpatient Hospital Stay (HOSPITAL_COMMUNITY)
Admission: EM | Admit: 2019-05-01 | Discharge: 2019-05-02 | DRG: 310 | Disposition: A | Discharge status: Home / Self Care | Payer: Medicare Other | Attending: Internal Medicine | Admitting: Internal Medicine

## 2019-05-01 DIAGNOSIS — I10 Essential (primary) hypertension: Secondary | ICD-10-CM

## 2019-05-01 DIAGNOSIS — E785 Hyperlipidemia, unspecified: Secondary | ICD-10-CM | POA: Diagnosis present

## 2019-05-01 DIAGNOSIS — F419 Anxiety disorder, unspecified: Secondary | ICD-10-CM | POA: Diagnosis present

## 2019-05-01 DIAGNOSIS — I4892 Unspecified atrial flutter: Secondary | ICD-10-CM | POA: Diagnosis present

## 2019-05-01 DIAGNOSIS — I129 Hypertensive chronic kidney disease with stage 1 through stage 4 chronic kidney disease, or unspecified chronic kidney disease: Secondary | ICD-10-CM | POA: Diagnosis present

## 2019-05-01 DIAGNOSIS — I2510 Atherosclerotic heart disease of native coronary artery without angina pectoris: Secondary | ICD-10-CM | POA: Diagnosis present

## 2019-05-01 DIAGNOSIS — I4510 Unspecified right bundle-branch block: Secondary | ICD-10-CM | POA: Diagnosis present

## 2019-05-01 DIAGNOSIS — I4891 Unspecified atrial fibrillation: Secondary | ICD-10-CM | POA: Diagnosis not present

## 2019-05-01 DIAGNOSIS — Z888 Allergy status to other drugs, medicaments and biological substances status: Secondary | ICD-10-CM | POA: Diagnosis not present

## 2019-05-01 DIAGNOSIS — R0602 Shortness of breath: Secondary | ICD-10-CM | POA: Diagnosis present

## 2019-05-01 DIAGNOSIS — Z7901 Long term (current) use of anticoagulants: Secondary | ICD-10-CM

## 2019-05-01 DIAGNOSIS — H8109 Meniere's disease, unspecified ear: Secondary | ICD-10-CM | POA: Diagnosis present

## 2019-05-01 DIAGNOSIS — Z79899 Other long term (current) drug therapy: Secondary | ICD-10-CM | POA: Diagnosis not present

## 2019-05-01 DIAGNOSIS — Z90710 Acquired absence of both cervix and uterus: Secondary | ICD-10-CM | POA: Diagnosis not present

## 2019-05-01 DIAGNOSIS — N183 Chronic kidney disease, stage 3 (moderate): Secondary | ICD-10-CM | POA: Diagnosis present

## 2019-05-01 DIAGNOSIS — G2581 Restless legs syndrome: Secondary | ICD-10-CM | POA: Diagnosis present

## 2019-05-01 DIAGNOSIS — Z8249 Family history of ischemic heart disease and other diseases of the circulatory system: Secondary | ICD-10-CM | POA: Diagnosis not present

## 2019-05-01 DIAGNOSIS — I4819 Other persistent atrial fibrillation: Principal | ICD-10-CM | POA: Diagnosis present

## 2019-05-01 DIAGNOSIS — Z20828 Contact with and (suspected) exposure to other viral communicable diseases: Secondary | ICD-10-CM | POA: Diagnosis present

## 2019-05-01 LAB — MAGNESIUM: Magnesium: 2.1 mg/dL (ref 1.7–2.4)

## 2019-05-01 LAB — CBC
HCT: 42.5 % (ref 36.0–46.0)
Hemoglobin: 14 g/dL (ref 12.0–15.0)
MCH: 34.1 pg — ABNORMAL HIGH (ref 26.0–34.0)
MCHC: 32.9 g/dL (ref 30.0–36.0)
MCV: 103.7 fL — ABNORMAL HIGH (ref 80.0–100.0)
Platelets: 222 10*3/uL (ref 150–400)
RBC: 4.1 MIL/uL (ref 3.87–5.11)
RDW: 14.1 % (ref 11.5–15.5)
WBC: 5.4 10*3/uL (ref 4.0–10.5)
nRBC: 0 % (ref 0.0–0.2)

## 2019-05-01 LAB — TROPONIN I (HIGH SENSITIVITY)
Troponin I (High Sensitivity): 11 ng/L (ref <18)
Troponin I (High Sensitivity): 17 ng/L (ref <18)

## 2019-05-01 LAB — BASIC METABOLIC PANEL
Anion gap: 9 (ref 5–15)
BUN: 18 mg/dL (ref 8–23)
CO2: 26 mmol/L (ref 22–32)
Calcium: 9.3 mg/dL (ref 8.9–10.3)
Chloride: 108 mmol/L (ref 98–111)
Creatinine, Ser: 1.56 mg/dL — ABNORMAL HIGH (ref 0.44–1.00)
GFR calc Af Amer: 37 mL/min — ABNORMAL LOW (ref >60)
GFR calc non Af Amer: 31 mL/min — ABNORMAL LOW (ref >60)
Glucose, Bld: 94 mg/dL (ref 70–99)
Potassium: 4.1 mmol/L (ref 3.5–5.1)
Sodium: 143 mmol/L (ref 135–145)

## 2019-05-01 LAB — SARS CORONAVIRUS 2 (TAT 6-24 HRS): SARS Coronavirus 2: NEGATIVE

## 2019-05-01 MED ORDER — DILTIAZEM HCL 25 MG/5ML IV SOLN
10.0000 mg | Freq: Once | INTRAVENOUS | Status: AC
  Administered 2019-05-01: 17:00:00 10 mg via INTRAVENOUS
  Filled 2019-05-01: qty 5

## 2019-05-01 MED ORDER — ONDANSETRON HCL 4 MG/2ML IJ SOLN
4.0000 mg | Freq: Once | INTRAMUSCULAR | Status: AC
  Administered 2019-05-01: 4 mg via INTRAVENOUS
  Filled 2019-05-01: qty 2

## 2019-05-01 MED ORDER — FLECAINIDE ACETATE 50 MG PO TABS
50.0000 mg | ORAL_TABLET | Freq: Two times a day (BID) | ORAL | Status: DC
  Administered 2019-05-01 – 2019-05-02 (×2): 50 mg via ORAL
  Filled 2019-05-01 (×3): qty 1

## 2019-05-01 MED ORDER — SODIUM CHLORIDE 0.9% FLUSH: 3.0000 mL | Freq: Once | INTRAVENOUS | Status: DC

## 2019-05-01 MED ORDER — DILTIAZEM HCL-DEXTROSE 100-5 MG/100ML-% IV SOLN (PREMIX)
5.0000 mg/h | INTRAVENOUS | Status: DC
  Administered 2019-05-01: 5 mg/h via INTRAVENOUS
  Filled 2019-05-01: qty 100

## 2019-05-01 MED ORDER — APIXABAN 5 MG PO TABS
5.0000 mg | ORAL_TABLET | Freq: Two times a day (BID) | ORAL | Status: DC
  Administered 2019-05-01 – 2019-05-02 (×2): 5 mg via ORAL
  Filled 2019-05-01 (×2): qty 1

## 2019-05-01 MED ORDER — GABAPENTIN 100 MG PO CAPS
100.0000 mg | ORAL_CAPSULE | Freq: Every day | ORAL | Status: DC
  Administered 2019-05-01: 100 mg via ORAL
  Filled 2019-05-01: qty 1

## 2019-05-01 MED ORDER — PANTOPRAZOLE SODIUM 40 MG PO TBEC
40.0000 mg | DELAYED_RELEASE_TABLET | Freq: Every day | ORAL | Status: DC
  Administered 2019-05-02: 40 mg via ORAL
  Filled 2019-05-01: qty 1

## 2019-05-01 NOTE — ED Notes (Signed)
Pt c/o minor soreness in her epigastric area when she presses  No rhy or  bp change

## 2019-05-01 NOTE — ED Notes (Signed)
The pt has a hx af  She is in af that she has been in this rhy for 2 years.  She had an ablation in January.  She wa seen here Friday for the af and was told to take cardizem and not her norvasc  She decided to do the opposite and her heart rate increased today

## 2019-05-01 NOTE — ED Notes (Signed)
Per lab trop QNS, need redraw

## 2019-05-01 NOTE — ED Notes (Signed)
Pt still in fib

## 2019-05-01 NOTE — H&P (Signed)
History and Physical    Hailey [name] ZOX:[id number] DOB: 12-09-1940 DOA: 05/01/2019  PCP: Richardean Chimera, MD  Patient coming from: Home  I have personally briefly reviewed patient's old medical records in [facility]  Chief Complaint: Increasing shortness of breath and palpitations  HPI: Hailey [name] is a 78 y.o. female with medical history significant of atrial fibrillation on Eliquis and flecainide status post ablation in January 2020, CAD, hypertension, and anxiety who presented with worsening shortness of breath and palpitation.  Patient reports that her symptoms started a month ago with progressive shortness of breath.  She was evaluated in the ED 2 days ago on 9/25 and was given a dose of 60 mg diltiazem and discharged with a prescription of Cardizem.   However her pharmacist advised her not to take her home amlodipine and the new Cardizem at the same time.  She then contacted her cardiologist and was told to stop her amlodipine and to take the Cardizem.  However, she wanted the wait and hear from her primary cardiologist on Monday prior to starting the medication.  However, during this time her palpitation continue to get worse and she felt very nauseous.  She also reports that every morning she feels a "chest pulling and rug burn sensation" that would improve when she gets up out of bed.  She denies any fevers or chills.  Denies any vomiting or abdominal pain.  Denies any new lower extremity edema but does note chronic edema around her bilateral ankles.  Patient endorsed compliant with her Eliquis and does not believe that she has missed any doses, although she states that "she would not remember if she did forget." At the last ED evaluation 2 days ago, she reportedly stated that it was possible she might have missed a dose of Eliquis in the past month and was deemed a poor cardioversion candidate by cardiology.  Denies any tobacco or illicit drug use.  Endorses occasional alcohol use.    ED Course: She was afebrile and normotensive with atrial fibrillation and heart rate going up to 120.  He showed no leukocytosis or anemia.  BMP showed stable creatinine of 1.56 which appears to be better than her past creatinine.  EKG shows atrial fibrillation with rate of 120 and with right bundle branch block.  Troponin at 11 and 17.  Review of Systems:  Constitutional: No Weight Change, No Fever ENT/Mouth: No sore throat, No Rhinorrhea Eyes: No Eye Pain, No Vision Changes Cardiovascular: + Chest Pain, + SOB, + Palpitations Respiratory: No Cough, No Sputum, No Wheezing, no Dyspnea  Gastrointestinal: + Nausea, No Vomiting, No Diarrhea, No Constipation, No Pain Genitourinary: no Urinary Incontinence, No Urgency, No Flank Pain Musculoskeletal: No Arthralgias, No Myalgias Skin: No Skin Lesions, No Pruritus, Neuro: no Weakness, No Numbness,  No Loss of Consciousness, No Syncope Psych: No Anxiety/Panic, No Depression, no decrease appetite Heme/Lymph: No Bruising, No Bleeding   Past Medical History:  Diagnosis Date  . Atrial fibrillation (HCC)   . Breast nodule 11/16/2013  . H/O hematuria   . HLD (hyperlipidemia)   . Hypertension   . 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 Migraine headache   . Palpitations   . Restless leg syndrome   . Vaginal atrophy     Past Surgical History:  Procedure Laterality Date  . APPENDECTOMY    . ATRIAL FIBRILLATION ABLATION N/A 08/20/2018   Procedure: ATRIAL FIBRILLATION ABLATION;  Surgeon: Regan Lemming, MD;  Location: [facility] INVASIVE CV LAB;  Service: Cardiovascular;  Laterality: N/A;  . CARDIAC CATHETERIZATION  2005    cone  . COLONOSCOPY    . HERNIA REPAIR    . LEFT HEART CATHETERIZATION WITH CORONARY ANGIOGRAM N/A 04/12/2012   Procedure: LEFT HEART CATHETERIZATION WITH CORONARY ANGIOGRAM;  Surgeon: Kathleene Hazelhristopher D McAlhany, MD;  [facility];  Service: Cardiovascular;  Laterality: N/A;  . TOTAL ABDOMINAL HYSTERECTOMY        reports that she has never smoked. She has never used smokeless tobacco. She reports current alcohol use. She reports that she does not use drugs.  Allergies  Allergen Reactions  . Amiodarone Itching  . Flomax [Tamsulosin Hcl] Nausea And Vomiting    Family History  Problem Relation Age of Onset  . Coronary artery disease Father        mid 8050s  . Hypertension Mother   . Stroke Brother   . Transient ischemic attack Brother   . Other Brother        on oxygen  . Hypertension Brother   . Stroke Brother   . Heart disease Brother   . Heart attack Brother   . Other Sister        a fib, restless leg  . Other Sister        pressure in eyes   Family history reviewed and not pertinent   Prior to Admission medications   Medication Sig Start Date End Date Taking? Authorizing Provider  acetaminophen (TYLENOL) 650 MG CR tablet Take 650 mg by mouth 2 (two) times daily.    Yes [provider]  amLODipine (NORVASC) 2.5 MG tablet Take 2.5 mg by mouth daily with lunch.   Yes [provider]  apixaban (ELIQUIS) 5 MG TABS tablet Take 5 mg by mouth 2 (two) times daily.   Yes [provider]  Calcium Carb-Cholecalciferol (CALCIUM 600+D3 PO) Take 1 tablet by mouth daily with lunch.    Yes [provider]  flecainide (TAMBOCOR) 100 MG tablet Take 50 mg by mouth 2 (two) times daily. 04/23/19  Yes [provider]  fluticasone (FLONASE) 50 MCG/ACT nasal spray Place 2 sprays into both nostrils daily as needed for allergies or rhinitis.   Yes [provider]  gabapentin (NEURONTIN) 100 MG capsule Take 100 mg by mouth at bedtime.   Yes [provider]  loratadine (CLARITIN) 10 MG tablet Take 10 mg by mouth daily as needed (seasonal allergies).    Yes [provider]  meclizine (ANTIVERT) 25 MG tablet Take 25 mg by mouth 2 (two) times daily as needed for dizziness.    Yes [provider]  metoprolol succinate (TOPROL-XL) 25 MG 24 hr  tablet TAKE 1/2 TABLET BY MOUTH EVERY DAY Patient taking differently: Take 12.5 mg by mouth daily with supper.  01/27/19  Yes Camnitz, Will Daphine DeutscherMartin, MD  metroNIDAZOLE (METROCREAM) 0.75 % cream Apply 1 application topically 2 (two) times daily as needed (rosacea).  04/27/19  Yes [provider]  Multiple Vitamin (MULTIVITAMIN WITH MINERALS) TABS tablet Take 1 tablet by mouth daily with lunch. Centrum Silver for Women    Yes [provider]  omeprazole (PRILOSEC) 20 MG capsule Take 1 capsule (20 mg total) by mouth daily. Patient taking differently: Take 20 mg by mouth daily with breakfast.  07/27/18  Yes Horton, Mayer Maskerourtney F, MD  ondansetron (ZOFRAN) 4 MG tablet Take 1 tablet (4 mg total) by mouth every 6 (six) hours as needed. Patient taking differently: Take  4 mg by mouth every 6 (six) hours as needed for nausea or vomiting.  04/24/18  Yes Dione Booze, MD  diltiazem (CARDIZEM CD) 180 MG 24 hr capsule Take 1 capsule (180 mg total) by mouth daily. 04/29/19   Sabas Sous, MD  flecainide (TAMBOCOR) 50 MG tablet Take 1 tablet (50 mg total) by mouth 2 (two) times daily. Patient not taking: Reported on 05/01/2019 09/14/18   Regan Lemming, MD  HYDROcodone-acetaminophen (NORCO/VICODIN) 5-325 MG tablet Take 1 tablet by mouth every 6 (six) hours as needed. Patient not taking: Reported on 05/01/2019 07/27/18   Shon Baton, MD    Physical Exam: Vitals:   05/01/19 1730 05/01/19 1800 05/01/19 1830 05/01/19 1900  BP: 120/82 (!) 129/96 123/82 134/88  Pulse: (!) 102 (!) 118 (!) 125 (!) 119  Resp: 17 16 17 19   Temp:      TempSrc:      SpO2: 98% 98% 96% 97%    Constitutional: NAD, calm, comfortable, pleasant elderly female sitting up on the edge of her bed Vitals:   05/01/19 1730 05/01/19 1800 05/01/19 1830 05/01/19 1900  BP: 120/82 (!) 129/96 123/82 134/88  Pulse: (!) 102 (!) 118 (!) 125 (!) 119  Resp: 17 16 17 19   Temp:      TempSrc:      SpO2: 98% 98% 96% 97%   Eyes:  PERRL, lids and conjunctivae normal ENMT: Mucous membranes are moist. Posterior pharynx clear of any exudate or lesions.Normal dentition.  Neck: normal, supple, no masses Respiratory: clear to auscultation bilaterally, no wheezing, no crackles. Normal respiratory effort. No accessory muscle use.  Cardiovascular: Irregularly irregular rhythm with heart rate of around 120s on telemetry, no murmurs / rubs / gallops.  Moderate nonpitting edema of bilateral ankle. 2+ pedal pulses.   Abdomen: no tenderness, no masses palpated. No hepatosplenomegaly. Bowel sounds positive.  Musculoskeletal: no clubbing / cyanosis. No joint deformity upper and lower extremities. Good ROM, no contractures. Normal muscle tone.  Skin: no rashes, lesions, ulcers. No induration Neurologic: CN 2-12 grossly intact. Sensation intact, DTR normal. Strength 5/5 in all 4.  Psychiatric: Normal judgment and insight. Alert and oriented x 3. Normal mood.    Labs on Admission: I have personally reviewed following labs and imaging studies  CBC: Recent Labs  Lab 04/29/19 1350 05/01/19 1645  WBC 6.4 5.4  HGB 14.3 14.0  HCT 44.9 42.5  MCV 103.9* 103.7*  PLT 266 222   Basic Metabolic Panel: Recent Labs  Lab 04/29/19 1350 05/01/19 1645  NA 142 143  K 4.1 4.1  CL 107 108  CO2 28 26  GLUCOSE 133* 94  BUN 17 18  CREATININE 1.55* 1.56*  CALCIUM 9.3 9.3  MG  --  2.1   GFR: CrCl cannot be calculated (Unknown ideal weight.). Liver Function Tests: No results for input(s): AST, ALT, ALKPHOS, BILITOT, PROT, ALBUMIN in the last 168 hours. No results for input(s): LIPASE, AMYLASE in the last 168 hours. No results for input(s): AMMONIA in the last 168 hours. Coagulation Profile: Recent Labs  Lab 04/29/19 1601  INR 1.3*   Cardiac Enzymes: No results for input(s): CKTOTAL, CKMB, CKMBINDEX, TROPONINI in the last 168 hours. BNP (last 3 results) No results for input(s): PROBNP in the last 8760 hours. HbA1C: No results for  input(s): HGBA1C in the last 72 hours. CBG: No results for input(s): GLUCAP in the last 168 hours. Lipid Profile: No results for input(s): CHOL, HDL, LDLCALC, TRIG, CHOLHDL, LDLDIRECT in the  last 72 hours. Thyroid Function Tests: No results for input(s): TSH, T4TOTAL, FREET4, T3FREE, THYROIDAB in the last 72 hours. Anemia Panel: No results for input(s): VITAMINB12, FOLATE, FERRITIN, TIBC, IRON, RETICCTPCT in the last 72 hours. Urine analysis:    Component Value Date/Time   COLORURINE YELLOW 04/29/2019 1649   APPEARANCEUR CLEAR 04/29/2019 1649   APPEARANCEUR Cloudy (A) 01/24/2015 1336   LABSPEC 1.005 04/29/2019 1649   PHURINE 6.0 04/29/2019 1649   GLUCOSEU NEGATIVE 04/29/2019 1649   HGBUR NEGATIVE 04/29/2019 1649   BILIRUBINUR NEGATIVE 04/29/2019 1649   BILIRUBINUR Negative 01/24/2015 1336   KETONESUR NEGATIVE 04/29/2019 1649   PROTEINUR NEGATIVE 04/29/2019 1649   UROBILINOGEN 0.2 09/29/2009 2034   NITRITE NEGATIVE 04/29/2019 1649   LEUKOCYTESUR MODERATE (A) 04/29/2019 1649    Radiological Exams on Admission: No results found.  EKG: Independently reviewed.   Assessment/Plan  Atrial fibrillation with RVR - Patient is status post ablation back in January 2020.  Patient is currently on metoprolol, flecainide and Eliquis for her atrial fibrillation and is followed by atrial fibrillation clinic outpatient.Does not believe she has missed any Eliquis doses although at ED visit 2 days ago she reported otherwise and was deemed not a good candidate for cardioversion by cardiology. - Consult cardiology Fellow Dr. Riley Lam who agrees with keeping patient on diltiazem gtt and continue home Flecainide. Continue Eliquis tonight and NPO past midnight to evaluate for potential cardioversion.   Hypertension -Currently on diltiazem drip for rate control -We will hold home amlodipine and metoprolol  Anxiety -Stable   DVT prophylaxis:.Lovenox Code Status: Full Family Communication:  Plan discussed with patient at bedside  disposition Plan: Home with at least 2 midnight stays  Consults called: Cardiology-Dr.[facility] Admission status: inpatient    Marsela Kuan T Amire Gossen DO [facility]   If 7PM-7AM, please contact night-coverage [email or web address] Password [facility]  05/01/2019, 7:22 PM

## 2019-05-01 NOTE — Consult Note (Signed)
Cardiology Consultation:   Patient ID: Hailey [name]; [id number]; 05/31/1941   Admit date: 05/01/2019 Date of Consult: 05/01/2019  Primary Care Provider: Richardean Chimeraaniel, Terry G, MD Primary Cardiologist: Prentice DockerSuresh Koneswaran, MD Primary Electrophysiologist:  Will Jorja LoaMartin Camnitz, MD  Chief Complaint: palpitations  Patient Profile:   Hailey Reamsla M Stencil is a 78 y.o. female with a hx of persistent AF s/p PVI in 08/2018 who presents with AF with RVR.  History of Present Illness:   Patient presented to the ED 2 days ago with A. Fib and heart rate in the 120s.  In 180 and discharged home.  However patient did not start this as she was told by a pharmacist that she cannot take it with her amlodipine.  Today with palpitations and found to be in A. fib with a heart rate in the 120s.  Was started on diltiazem IV infusion in the ED.  Currently heart rate is 95-100.  Is sitting up in bed and appears well.  Reports no missed doses of apixaban or flecainide.  Patient to be admitted to [facility] med.   Past Medical History:  Diagnosis Date  . Atrial fibrillation (HCC)   . Breast nodule 11/16/2013  . H/O hematuria   . HLD (hyperlipidemia)   . Hypertension   . 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 Migraine headache   . Palpitations   . Restless leg syndrome   . Vaginal atrophy     Past Surgical History:  Procedure Laterality Date  . APPENDECTOMY    . ATRIAL FIBRILLATION ABLATION N/A 08/20/2018   Procedure: ATRIAL FIBRILLATION ABLATION;  Surgeon: Regan Lemmingamnitz, Will Martin, MD;  Location: [facility];  Service: Cardiovascular;  Laterality: N/A;  . CARDIAC CATHETERIZATION  2005    cone  . COLONOSCOPY    . HERNIA REPAIR    . LEFT HEART CATHETERIZATION WITH CORONARY ANGIOGRAM N/A 04/12/2012   Procedure: LEFT HEART CATHETERIZATION WITH CORONARY ANGIOGRAM;  Surgeon: Kathleene Hazelhristopher D McAlhany, MD;  [facility];  Service: Cardiovascular;  Laterality: N/A;  . TOTAL ABDOMINAL HYSTERECTOMY        Inpatient Medications: Scheduled Meds: . apixaban  5 mg Oral BID  . flecainide  50 mg Oral BID  . gabapentin  100 mg Oral QHS  . [START ON 05/02/2019] pantoprazole  40 mg Oral Daily  . sodium chloride flush  3 mL Intravenous Once   Continuous Infusions: . diltiazem (CARDIZEM) infusion 5 mg/hr (05/01/19 1937)   PRN Meds:   Home Meds: Prior to Admission medications   Medication Sig Start Date End Date Taking? Authorizing Provider  acetaminophen (TYLENOL) 650 MG CR tablet Take 650 mg by mouth 2 (two) times daily.    Yes [provider]  amLODipine (NORVASC) 2.5 MG tablet Take 2.5 mg by mouth daily with lunch.   Yes [provider]  apixaban (ELIQUIS) 5 MG TABS tablet Take 5 mg by mouth 2 (two) times daily.   Yes [provider]  Calcium Carb-Cholecalciferol (CALCIUM 600+D3 PO) Take 1 tablet by mouth daily with lunch.    Yes [provider]  flecainide (TAMBOCOR) 100 MG tablet Take 50 mg by mouth 2 (two) times daily. 04/23/19  Yes [provider]  fluticasone (FLONASE) 50 MCG/ACT nasal spray Place 2 sprays into both nostrils daily as needed for allergies or rhinitis.   Yes [provider]  gabapentin (NEURONTIN) 100 MG capsule Take 100 mg by mouth at bedtime.   Yes [provider]  loratadine (CLARITIN) 10 MG tablet Take 10 mg by mouth daily as needed (seasonal allergies).    Yes [provider]  meclizine (ANTIVERT) 25 MG tablet Take 25 mg by mouth 2 (two) times daily as needed for dizziness.    Yes [provider]  metoprolol succinate (TOPROL-XL) 25 MG 24 hr tablet TAKE 1/2 TABLET BY MOUTH EVERY DAY Patient taking differently: Take 12.5 mg by mouth daily with supper.  01/27/19  Yes Camnitz, Will Daphine Deutscher, MD  metroNIDAZOLE (METROCREAM) 0.75 % cream Apply 1 application topically 2 (two) times daily as needed (rosacea).  04/27/19  Yes [provider]  Multiple Vitamin (MULTIVITAMIN WITH MINERALS) TABS  tablet Take 1 tablet by mouth daily with lunch. Centrum Silver for Women    Yes [provider]  omeprazole (PRILOSEC) 20 MG capsule Take 1 capsule (20 mg total) by mouth daily. Patient taking differently: Take 20 mg by mouth daily with breakfast.  07/27/18  Yes Horton, Mayer Masker, MD  ondansetron (ZOFRAN) 4 MG tablet Take 1 tablet (4 mg total) by mouth every 6 (six) hours as needed. Patient taking differently: Take 4 mg by mouth every 6 (six) hours as needed for nausea or vomiting.  04/24/18  Yes Dione Booze, MD  diltiazem (CARDIZEM CD) 180 MG 24 hr capsule Take 1 capsule (180 mg total) by mouth daily. 04/29/19   Sabas Sous, MD  flecainide (TAMBOCOR) 50 MG tablet Take 1 tablet (50 mg total) by mouth 2 (two) times daily. Patient not taking: Reported on 05/01/2019 09/14/18   Regan Lemming, MD  HYDROcodone-acetaminophen (NORCO/VICODIN) 5-325 MG tablet Take 1 tablet by mouth every 6 (six) hours as needed. Patient not taking: Reported on 05/01/2019 07/27/18   Horton, Mayer Masker, MD    Allergies:    Allergies  Allergen Reactions  . Amiodarone Itching  . Flomax [Tamsulosin Hcl] Nausea And Vomiting    Social History:   Social History   Socioeconomic History  . Marital status: Single    Spouse name: Not on file  . Number of children: 0  . Years of education: College  . Highest education level: Not on file  Occupational History  . Occupation: retired  Engineer, production  . Financial resource strain: Not on file  . Food insecurity    Worry: Not on file    Inability: Not on file  . Transportation needs    Medical: Not on file    Non-medical: Not on file  Tobacco Use  . Smoking status: Never Smoker  . Smokeless tobacco: Never Used  Substance and Sexual Activity  . Alcohol use: Yes    Comment: rarely; glass of wine with dinner  . Drug use: No  . Sexual activity: Never    Birth control/protection: Surgical    Comment: hyst  Lifestyle  . Physical activity    Days per  week: Not on file    Minutes per session: Not on file  . Stress: Not on file  Relationships  . Social Musician on phone: Not on file    Gets together: Not on file    Attends religious service: Not on file    Active member of club or organization: Not on file    Attends meetings of clubs or organizations: Not on file    Relationship status: Not on file  . Intimate partner violence    Fear of current or ex partner: Not on file    Emotionally abused: Not on file  Physically abused: Not on file    Forced sexual activity: Not on file  Other Topics Concern  . Not on file  Social History Narrative   Single. Retired.    Patient lives at home alone.   Caffeine Use: none     Family History:    Family History  Problem Relation Age of Onset  . Coronary artery disease Father        mid 38s  . Hypertension Mother   . Stroke Brother   . Transient ischemic attack Brother   . Other Brother        on oxygen  . Hypertension Brother   . Stroke Brother   . Heart disease Brother   . Heart attack Brother   . Other Sister        a fib, restless leg  . Other Sister        pressure in eyes      ROS:  Please see the history of present illness.  All other ROS reviewed and negative.     Physical Exam/Data:   Vitals:   05/01/19 1900 05/01/19 1930 05/01/19 2030 05/01/19 2104  BP: 134/88 127/88 128/83 (!) 139/100  Pulse: (!) 119 (!) 126 (!) 103 97  Resp: 19 19 (!) 22 18  Temp:    97.8 F (36.6 C)  TempSrc:    Oral  SpO2: 97% 95% 96% 100%  Weight:    89.1 kg  Height:    5\' 7"  (1.702 m)   No intake or output data in the 24 hours ending 05/01/19 2151 Last 3 Weights 05/01/2019 02/18/2019 11/17/2018  Weight (lbs) 196 lb 6.4 oz 190 lb 195 lb  Weight (kg) 89.086 kg 86.183 kg 88.451 kg     Body mass index is 30.76 kg/m.  General: Well developed, well nourished, in no acute distress. Head: Normocephalic, atraumatic, sclera non-icteric, no xanthomas, nares are without  discharge.  Neck: Negative for carotid bruits. JVD not elevated. Lungs: Clear bilaterally to auscultation without wheezes, rales, or rhonchi. Breathing is unlabored. Heart: irregular with S1 S2. No murmurs, rubs, or gallops appreciated. Abdomen: Soft, non-tender, non-distended with normoactive bowel sounds. No hepatomegaly. No rebound/guarding. No obvious abdominal masses. Msk:  Strength and tone appear normal for age. Extremities: No clubbing or cyanosis. No edema.  Distal pedal pulses are 2+ and equal bilaterally. Neuro: Alert and oriented X 3. No facial asymmetry. No focal deficit. Moves all extremities spontaneously. Psych:  Responds to questions appropriately with a normal affect.   EKG:  The EKG was personally reviewed and demonstrates:  AF with RVR Telemetry:  Telemetry was personally reviewed and demonstrates:  AF  Relevant CV Studies: None  Laboratory Data:  High Sensitivity Troponin:   Recent Labs  Lab 04/29/19 1350 04/29/19 1545 05/01/19 1645  TROPONINIHS 12 11 11      Cardiac EnzymesNo results for input(s): TROPONINI in the last 168 hours. No results for input(s): TROPIPOC in the last 168 hours.  Chemistry Recent Labs  Lab 04/29/19 1350 05/01/19 1645  NA 142 143  K 4.1 4.1  CL 107 108  CO2 28 26  GLUCOSE 133* 94  BUN 17 18  CREATININE 1.55* 1.56*  CALCIUM 9.3 9.3  GFRNONAA 32* 31*  GFRAA 37* 37*  ANIONGAP 7 9    No results for input(s): PROT, ALBUMIN, AST, ALT, ALKPHOS, BILITOT in the last 168 hours. Hematology Recent Labs  Lab 04/29/19 1350 05/01/19 1645  WBC 6.4 5.4  RBC 4.32 4.10  HGB 14.3  14.0  HCT 44.9 42.5  MCV 103.9* 103.7*  MCH 33.1 34.1*  MCHC 31.8 32.9  RDW 13.8 14.1  PLT 266 222   BNPNo results for input(s): BNP, PROBNP in the last 168 hours.  DDimer No results for input(s): DDIMER in the last 168 hours.   Radiology/Studies:  Dg Chest 2 View  Result Date: 04/29/2019 CLINICAL DATA:  Shortness of breath, chest palpitations. EXAM:  CHEST - 2 VIEW COMPARISON:  Radiographs of July 26, 2018. FINDINGS: The heart size and mediastinal contours are within normal limits. Both lungs are clear. No pneumothorax or pleural effusion is noted. The visualized skeletal structures are unremarkable. IMPRESSION: No active cardiopulmonary disease. Electronically Signed   By: Lupita Raider M.D.   On: 04/29/2019 14:05    Assessment and Plan:   1. Atrial fibrillation Second presentation in several days with a fib with RVR.  Does not appear that she ever started taking diltiazem for rate control.  Would continue diltiazem IV for rate control overnight.  Continue home flecainide and apixaban.  Keep n.p.o. at midnight for possible cardioversion tomorrow.  Will discuss with Dr. Elberta Fortis tomorrow regarding cardioversion and antiarrhythmic options.      For questions or updates, please contact [facility] Please consult [email or web address] for contact info under     Signed, Allison Quarry, MD  05/01/2019 9:51 PM

## 2019-05-01 NOTE — ED Notes (Signed)
Pt to br 

## 2019-05-01 NOTE — ED Triage Notes (Signed)
Pt states seen here a few days ago for afib rvr, reports she was started on cardizem here in the hospital Friday, woke up sat feeling sick to her stomach. States when she went to pick up the med at the pharmacy, the pharmacist advised her she did not need to take amlodipine and cardizem at the same time. Pt states she called dr Curt Bears phone service who advised her to stop her amlodipine and start the cardizem this am but she states she wanted to wait and speak to doctor camnitz on Monday since she felt sick Saturday after having cardizem in the ER Friday night. Has continued amlodipine. A/ox4, resp e/u, nad.

## 2019-05-01 NOTE — ED Provider Notes (Signed)
[facility] EMERGENCY DEPARTMENT Provider Note   CSN: [id number] Arrival date & time: 05/01/19  1440     History   Chief Complaint Chief Complaint  Patient presents with  . afib rvr    HPI Hailey [name] is a 78 y.o. female with a past medical history of atrial fibrillation status post ablation in January 2020 presents to ED for palpitations. Patient was seen and evaluated on 04/29/2019, found to be in A. fib with RVR.  She is currently anticoagulated on Eliquis but did miss a dose prior to her presentation so she was not a candidate for cardioversion.  She was told to take Cardizem was given her first dose of it in the ED 2 days ago.  She went to the pharmacy to get this medication filled but the pharmacist told her that she should not take amlodipine and Cardizem at the same time.  She was told to only take the Cardizem.  She then called the on-call cardiologist yesterday who told her also to discontinue the amlodipine and only take the Cardizem.  However she states that she is only taking amlodipine and has not had a dose of Cardizem since leaving the ED 2 days ago.  She started feeling palpitations today and states that "I do not know if my A. fib ever really went away."  She is afraid of taking the Cardizem because she noted when she woke up yesterday morning she felt sick to her stomach.  However she continues to be nauseous despite not taking Cardizem.  She denies any chest pain but does report shortness of breath similar to her baseline.  She denies any abdominal pain, vomiting, fever, cough.     HPI  Past Medical History:  Diagnosis Date  . Atrial fibrillation ([place])   . Breast nodule 11/16/2013  . H/O hematuria   . HLD (hyperlipidemia)   . Hypertension   . 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 Migraine headache   . Palpitations   . Restless leg syndrome   . Vaginal atrophy     Patient Active Problem List   Diagnosis Date Noted  . Atrial  fibrillation ([place]) 07/14/2018  . Vaginal dryness 01/28/2017  . Vaginal atrophy 01/28/2017  . Breast nodule 11/16/2013  . Breast mass, right 12/22/2012  . OVERWEIGHT 04/13/2009  . GENERALIZED ANXIETY DISORDER 04/13/2009  . CORONARY ARTERY DISEASE 04/13/2009  . PALPITATIONS 04/03/2009    Past Surgical History:  Procedure Laterality Date  . APPENDECTOMY    . ATRIAL FIBRILLATION ABLATION N/A 08/20/2018   Procedure: ATRIAL FIBRILLATION ABLATION;  Surgeon: Constance Haw, MD;  Location: [place] CV LAB;  Service: Cardiovascular;  Laterality: N/A;  . CARDIAC CATHETERIZATION  2005    cone  . COLONOSCOPY    . HERNIA REPAIR    . LEFT HEART CATHETERIZATION WITH CORONARY ANGIOGRAM N/A 04/12/2012   Procedure: LEFT HEART CATHETERIZATION WITH CORONARY ANGIOGRAM;  Surgeon: Burnell Blanks, MD;  Location: [facility] CATH LAB;  Service: Cardiovascular;  Laterality: N/A;  . TOTAL ABDOMINAL HYSTERECTOMY       OB History    Gravida  1   Para      Term      Preterm      AB  1   Living  0     SAB  1   TAB      Ectopic      Multiple      Live Births  Home Medications    Prior to Admission medications   Medication Sig Start Date End Date Taking? Authorizing Provider  acetaminophen (TYLENOL) 650 MG CR tablet Take 650 mg by mouth 2 (two) times daily.    Yes [provider]  amLODipine (NORVASC) 2.5 MG tablet Take 2.5 mg by mouth daily with lunch.   Yes [provider]  apixaban (ELIQUIS) 5 MG TABS tablet Take 5 mg by mouth 2 (two) times daily.   Yes [provider]  Calcium Carb-Cholecalciferol (CALCIUM 600+D3 PO) Take 1 tablet by mouth daily with lunch.    Yes [provider]  flecainide (TAMBOCOR) 100 MG tablet Take 50 mg by mouth 2 (two) times daily. 04/23/19  Yes [provider]  fluticasone (FLONASE) 50 MCG/ACT nasal spray Place 2 sprays into both nostrils daily as needed for allergies or rhinitis.   Yes [provider]  gabapentin (NEURONTIN) 100 MG capsule Take 100 mg by mouth at bedtime.   Yes [provider]  loratadine (CLARITIN) 10 MG tablet Take 10 mg by mouth daily as needed (seasonal allergies).    Yes [provider]  meclizine (ANTIVERT) 25 MG tablet Take 25 mg by mouth 2 (two) times daily as needed for dizziness.    Yes [provider]  metoprolol succinate (TOPROL-XL) 25 MG 24 hr tablet TAKE 1/2 TABLET BY MOUTH EVERY DAY Patient taking differently: Take 12.5 mg by mouth daily with supper.  01/27/19  Yes Camnitz, Will Daphine Deutscher, MD  metroNIDAZOLE (METROCREAM) 0.75 % cream Apply 1 application topically 2 (two) times daily as needed (rosacea).  04/27/19  Yes [provider]  Multiple Vitamin (MULTIVITAMIN WITH MINERALS) TABS tablet Take 1 tablet by mouth daily with lunch. Centrum Silver for Women    Yes [provider]  omeprazole (PRILOSEC) 20 MG capsule Take 1 capsule (20 mg total) by mouth daily. Patient taking differently: Take 20 mg by mouth daily with breakfast.  07/27/18  Yes Horton, Mayer Masker, MD  ondansetron (ZOFRAN) 4 MG tablet Take 1 tablet (4 mg total) by mouth every 6 (six) hours as needed. Patient taking differently: Take 4 mg by mouth every 6 (six) hours as needed for nausea or vomiting.  04/24/18  Yes Dione Booze, MD  diltiazem (CARDIZEM CD) 180 MG 24 hr capsule Take 1 capsule (180 mg total) by mouth daily. 04/29/19   Sabas Sous, MD  flecainide (TAMBOCOR) 50 MG tablet Take 1 tablet (50 mg total) by mouth 2 (two) times daily. Patient not taking: Reported on 05/01/2019 09/14/18   Regan Lemming, MD  HYDROcodone-acetaminophen (NORCO/VICODIN) 5-325 MG tablet Take 1 tablet by mouth every 6 (six) hours as needed. Patient not taking: Reported on 05/01/2019 07/27/18   Horton, Mayer Masker, MD    Family History Family History  Problem Relation Age of Onset  . Coronary artery disease Father        mid 34s  . Hypertension Mother   .  Stroke Brother   . Transient ischemic attack Brother   . Other Brother        on oxygen  . Hypertension Brother   . Stroke Brother   . Heart disease Brother   . Heart attack Brother   . Other Sister        a fib, restless leg  . Other Sister        pressure in eyes    Social History Social History   Tobacco Use  . Smoking status: Never Smoker  .  Smokeless tobacco: Never Used  Substance Use Topics  . Alcohol use: Yes    Comment: rarely; glass of wine with dinner  . Drug use: No     Allergies   Amiodarone and Flomax [tamsulosin hcl]   Review of Systems Review of Systems  Constitutional: Negative for appetite change, chills and fever.  HENT: Negative for ear pain, rhinorrhea, sneezing and sore throat.   Eyes: Negative for photophobia and visual disturbance.  Respiratory: Positive for shortness of breath. Negative for cough, chest tightness and wheezing.   Cardiovascular: Positive for palpitations. Negative for chest pain.  Gastrointestinal: Negative for abdominal pain, blood in stool, constipation, diarrhea, nausea and vomiting.  Genitourinary: Negative for dysuria, hematuria and urgency.  Musculoskeletal: Negative for myalgias.  Skin: Negative for rash.  Neurological: Negative for dizziness, weakness and light-headedness.     Physical Exam Updated Vital Signs BP (!) 129/96   Pulse (!) 118   Temp (!) 97.3 F (36.3 C) (Oral)   Resp 16   SpO2 98%   Physical Exam Vitals signs and nursing note reviewed.  Constitutional:      General: She is not in acute distress.    Appearance: She is well-developed.  HENT:     Head: Normocephalic and atraumatic.     Nose: Nose normal.  Eyes:     General: No scleral icterus.       Left eye: No discharge.     Conjunctiva/sclera: Conjunctivae normal.  Neck:     Musculoskeletal: Normal range of motion and neck supple.  Cardiovascular:     Rate and Rhythm: Tachycardia present. Rhythm irregular.     Heart sounds: Normal  heart sounds. No murmur. No friction rub. No gallop.   Pulmonary:     Effort: Pulmonary effort is normal. No respiratory distress.     Breath sounds: Normal breath sounds.  Abdominal:     General: Bowel sounds are normal. There is no distension.     Palpations: Abdomen is soft.     Tenderness: There is no abdominal tenderness. There is no guarding.  Musculoskeletal: Normal range of motion.  Skin:    General: Skin is warm and dry.     Findings: No rash.  Neurological:     Mental Status: She is alert.     Motor: No abnormal muscle tone.     Coordination: Coordination normal.      ED Treatments / Results  Labs (all labs ordered are listed, but only abnormal results are displayed) Labs Reviewed  BASIC METABOLIC PANEL - Abnormal; Notable for the following components:      Result Value   Creatinine, Ser 1.56 (*)    GFR calc non Af Amer 31 (*)    GFR calc Af Amer 37 (*)    All other components within normal limits  CBC - Abnormal; Notable for the following components:   MCV 103.7 (*)    MCH 34.1 (*)    All other components within normal limits  SARS CORONAVIRUS 2 (TAT 6-24 HRS)  MAGNESIUM  TROPONIN I (HIGH SENSITIVITY)  TROPONIN I (HIGH SENSITIVITY)    EKG EKG Interpretation  Date/Time:  Sunday May 01 2019 14:46:16 EDT Ventricular Rate:  125 PR Interval:    QRS Duration: 130 QT Interval:  324 QTC Calculation: 467 R Axis:   121 Text Interpretation:  Atrial fibrillation with rapid ventricular response Right bundle branch block T wave abnormality, consider inferolateral ischemia Abnormal ECG rate faster than previous Confirmed by Richardean CanalYao, David H [phone]([id number])  on 05/01/2019 3:04:25 PM   Radiology No results found.  Procedures .Critical Care Performed by: Dietrich Pates, PA-C Authorized by: Dietrich Pates, PA-C   Critical care provider statement:    Critical care time (minutes):  35   Critical care was necessary to treat or prevent imminent or life-threatening deterioration  of the following conditions:  Cardiac failure, respiratory failure and circulatory failure   Critical care was time spent personally by me on the following activities:  Development of treatment plan with patient or surrogate, discussions with consultants, evaluation of patient's response to treatment, examination of patient, ordering and performing treatments and interventions, ordering and review of laboratory studies, ordering and review of radiographic studies, pulse oximetry, re-evaluation of patient's condition and review of old charts   (including critical care time)  Medications Ordered in ED Medications  sodium chloride flush (NS) 0.9 % injection 3 mL (3 mLs Intravenous Not Given 05/01/19 1814)  diltiazem (CARDIZEM) 100 mg in dextrose 5% (1 mg/mL) infusion (has no administration in time range)  diltiazem (CARDIZEM) injection 10 mg (10 mg Intravenous Given 05/01/19 1636)  ondansetron (ZOFRAN) injection 4 mg (4 mg Intravenous Given 05/01/19 1634)     Initial Impression / Assessment and Plan / ED Course  I have reviewed the triage vital signs and the nursing notes.  Pertinent labs & imaging results that were available during my care of the patient were reviewed by me and considered in my medical decision making (see chart for details).        78yo F with a past medical history of A. fib with ablation done in January, currently anticoagulated on Eliquis but may have missed a dose in the past month presents to ED in atrial fibrillation.  She was seen and evaluated 2 days ago, started on Cardizem.  She has not been compliant with this medication due to feeling nauseous after taking it.  She continues to take her amlodipine.  Patient afebrile here with HR between 110s and 130s. Given a dose of Cardizem here but continues to be in A. fib with a rate of 120s.  Lab work is reassuring.  Patient started on Cardizem gtt and will need to be admitted for ongoing management.  Final Clinical  Impressions(s) / ED Diagnoses   Final diagnoses:  Atrial fibrillation, unspecified type [facility])    ED Discharge Orders    None       Dietrich Pates, PA-C 05/01/19 1900    Charlynne Pander, MD 05/01/19 2201

## 2019-05-01 NOTE — Progress Notes (Signed)
Patient arrived to unit, no complaints, on medication related questions. Patient did have a zip lock bag with about 4 pills in it and RN instructed pt not to take home meds and placed bag in patient belongings.  Cardizem infusing at 5mg /hr. Cardiac telemetry initiated and verified. VSS. Will continue to monitor throughout the shift.

## 2019-05-02 ENCOUNTER — Inpatient Hospital Stay (HOSPITAL_COMMUNITY): Payer: Medicare Other | Admitting: Certified Registered"

## 2019-05-02 ENCOUNTER — Encounter (HOSPITAL_COMMUNITY): Payer: Self-pay

## 2019-05-02 ENCOUNTER — Encounter (HOSPITAL_COMMUNITY)
Admission: EM | Disposition: A | Discharge status: Home / Self Care | Payer: Self-pay | Source: Home / Self Care | Attending: Family Medicine

## 2019-05-02 DIAGNOSIS — I4892 Unspecified atrial flutter: Secondary | ICD-10-CM

## 2019-05-02 DIAGNOSIS — I4819 Other persistent atrial fibrillation: Principal | ICD-10-CM

## 2019-05-02 HISTORY — PX: CARDIOVERSION: SHX1299

## 2019-05-02 LAB — CBC
HCT: 42.5 % (ref 36.0–46.0)
Hemoglobin: 13.6 g/dL (ref 12.0–15.0)
MCH: 32.7 pg (ref 26.0–34.0)
MCHC: 32 g/dL (ref 30.0–36.0)
MCV: 102.2 fL — ABNORMAL HIGH (ref 80.0–100.0)
Platelets: 243 10*3/uL (ref 150–400)
RBC: 4.16 MIL/uL (ref 3.87–5.11)
RDW: 13.9 % (ref 11.5–15.5)
WBC: 5.4 10*3/uL (ref 4.0–10.5)
nRBC: 0 % (ref 0.0–0.2)

## 2019-05-02 LAB — BASIC METABOLIC PANEL
Anion gap: 9 (ref 5–15)
BUN: 14 mg/dL (ref 8–23)
CO2: 25 mmol/L (ref 22–32)
Calcium: 9.1 mg/dL (ref 8.9–10.3)
Chloride: 109 mmol/L (ref 98–111)
Creatinine, Ser: 1.42 mg/dL — ABNORMAL HIGH (ref 0.44–1.00)
GFR calc Af Amer: 41 mL/min — ABNORMAL LOW (ref >60)
GFR calc non Af Amer: 35 mL/min — ABNORMAL LOW (ref >60)
Glucose, Bld: 95 mg/dL (ref 70–99)
Potassium: 4.1 mmol/L (ref 3.5–5.1)
Sodium: 143 mmol/L (ref 135–145)

## 2019-05-02 SURGERY — CARDIOVERSION
Anesthesia: General

## 2019-05-02 MED ORDER — FLECAINIDE ACETATE 50 MG PO TABS: 50.0000 mg | ORAL_TABLET | Freq: Two times a day (BID) | ORAL | 6 refills | Status: DC

## 2019-05-02 MED ORDER — SODIUM CHLORIDE 0.9% FLUSH: 3.0000 mL | INTRAVENOUS | Status: DC | PRN

## 2019-05-02 MED ORDER — DILTIAZEM HCL ER COATED BEADS 180 MG PO CP24: 180.0000 mg | ORAL_CAPSULE | Freq: Every day | ORAL | 6 refills | Status: DC

## 2019-05-02 MED ORDER — SODIUM CHLORIDE 0.9 % IV SOLN: 250.0000 mL | INTRAVENOUS | Status: DC

## 2019-05-02 MED ORDER — SODIUM CHLORIDE 0.9% FLUSH
3.0000 mL | Freq: Two times a day (BID) | INTRAVENOUS | Status: DC
  Administered 2019-05-02: 3 mL via INTRAVENOUS

## 2019-05-02 MED ORDER — PROPOFOL 10 MG/ML IV BOLUS
INTRAVENOUS | Status: DC | PRN
  Administered 2019-05-02: 50 mg via INTRAVENOUS

## 2019-05-02 MED ORDER — DILTIAZEM HCL ER COATED BEADS 180 MG PO CP24
180.0000 mg | ORAL_CAPSULE | Freq: Every day | ORAL | Status: DC
  Administered 2019-05-02: 15:00:00 180 mg via ORAL
  Filled 2019-05-02: qty 1

## 2019-05-02 MED ORDER — HYDROCORTISONE 1 % EX CREA
1.0000 "application " | TOPICAL_CREAM | Freq: Three times a day (TID) | CUTANEOUS | Status: DC | PRN
  Filled 2019-05-02: qty 28

## 2019-05-02 MED ORDER — SODIUM CHLORIDE 0.9 % IV SOLN
INTRAVENOUS | Status: DC | PRN
  Administered 2019-05-02: 11:00:00 via INTRAVENOUS

## 2019-05-02 MED ORDER — LIDOCAINE 2% (20 MG/ML) 5 ML SYRINGE
INTRAMUSCULAR | Status: DC | PRN
  Administered 2019-05-02: 40 mg via INTRAVENOUS

## 2019-05-02 NOTE — CV Procedure (Signed)
Procedure:   DCCV  Indication:  Symptomatic atrial flutter  Procedure Note:  The patient signed informed consent.  They have had had therapeutic anticoagulation with apixaban greater than 3 weeks.  Anesthesia was administered by Dr. Glennon Mac.  Adequate airway was maintained throughout and vital followed per protocol.  They were cardioverted x 1 with 120J of biphasic synchronized energy.  They converted to NSR.  There were no apparent complications.  The patient had normal neuro status and respiratory status post procedure with vitals stable as recorded elsewhere.    Follow up:  They will continue on current medical therapy and follow up with cardiology as scheduled.  Buford Dresser, MD PhD 05/02/2019 11:19 AM

## 2019-05-02 NOTE — H&P (View-Only) (Signed)
Cardiology Consultation:   Patient ID: Hailey [name] MRN: [id number]; DOB: 06/24/1941  Admit date: 05/01/2019 Date of Consult: 05/02/2019  Primary Care Provider: Richardean Chimeraaniel, Terry G, MD Primary Cardiologist: Prentice DockerSuresh Koneswaran, MD  Primary Electrophysiologist:  Lenette Rau Jorja LoaMartin Stephaney Steven, MD    Patient Profile:   Hailey [name] is a 78 y.o. female with a hx of HTN, HLDM and persistent AFib who is being seen today for the evaluation of recurrent AFib w/RVR at the request of Dr. Electa SniffBarnett.   AFib Hx: Diagnosed ? Confirmed Oct 2018 and started on amiodarone w/Dr. Purvis SheffieldKoneswaran 03/2018, c/o palpitations, 1 week monitor without AF 04/2019 amio stopped with c/o dizzy spells/nausea >> BB >> hypotension >> Metoprolol and her amlodipine were stopped >> referred to AFib clinic Developed persistent AFib while waiting for amio washout started on low dose BB >> SB 40's QTc felt at baseline [facility]2018 2/2 Bradycardia required stopping her BB  >> Multaq AFib ablation Jan 2020 Feb 2020 AFib clinic note mentions recurrent PAFib post ablation Multaq stopped >> Flecainide 100mg  BID GI symptoms required down titration to 50mg  BID (with low dose lopressor)  History of Present Illness:   Hailey [name] presented to the ER Friday with palpitations/CP she was found in AFib w/RVR, HS Trop were 12 and 11, (h/o LHC in 2013 with NOD, and negative lexiscan stress test in 2018) no suspicion of ACS.  The ER doctor elicited report of possible missed Eliquis dose and in d/w Dr. Elberta Fortisamnitz, given this, planned to start dilt and f/u with the AFib clinic this week.  The patient did not start the ditiazem with recommendations by her pharmacist to hold the amlodipine when starting the dilt, the patient wanted to confirm with Dr. Elberta Fortisamnitz Monday so made no changes over the weekend.  [facility] with concerns she was still in AFib, nauseous and having palpitations.  LABS K+ 4/1 Mag 2.1  HS Trop 11, 17 WBC 5.4 H/H 14/42 Plts 222  This morning the patient tells Dr. Elberta Fortisamnitz that she is certain that she has not missed any Eliquis doses (in the last 3 weeks if ever), says she is not sure why that was reported and that she takes her medicine "religiously".  She reports no CP, but when in Afib she gets winded easily, currently feels well at rest.     Heart Pathway Score:     Past Medical History:  Diagnosis Date  . Atrial fibrillation (HCC)   . Breast nodule 11/16/2013  . H/O hematuria   . HLD (hyperlipidemia)   . Hypertension   . 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 Migraine headache   . Palpitations   . Restless leg syndrome   . Vaginal atrophy     Past Surgical History:  Procedure Laterality Date  . APPENDECTOMY    . ATRIAL FIBRILLATION ABLATION N/A 08/20/2018   Procedure: ATRIAL FIBRILLATION ABLATION;  Surgeon: Regan Lemmingamnitz, Shanon Becvar Martin, MD;  Location: [facility] INVASIVE CV LAB;  Service: Cardiovascular;  Laterality: N/A;  . CARDIAC CATHETERIZATION  2005    cone  . COLONOSCOPY    . HERNIA REPAIR    . LEFT HEART CATHETERIZATION WITH CORONARY ANGIOGRAM N/A 04/12/2012   Procedure: LEFT HEART CATHETERIZATION WITH CORONARY ANGIOGRAM;  Surgeon: Kathleene Hazelhristopher D McAlhany, MD;  [facility];  Service: Cardiovascular;  Laterality: N/A;  . TOTAL ABDOMINAL HYSTERECTOMY       Home Medications:  Prior to Admission medications  Medication Sig Start Date End Date Taking? Authorizing Provider  acetaminophen (TYLENOL) 650 MG CR tablet Take 650 mg by mouth 2 (two) times daily.    Yes [provider]  amLODipine (NORVASC) 2.5 MG tablet Take 2.5 mg by mouth daily with lunch.   Yes [provider]  apixaban (ELIQUIS) 5 MG TABS tablet Take 5 mg by mouth 2 (two) times daily.   Yes [provider]  Calcium Carb-Cholecalciferol (CALCIUM 600+D3 PO) Take 1 tablet by mouth daily with lunch.    Yes [provider]  flecainide (TAMBOCOR) 100 MG  tablet Take 50 mg by mouth 2 (two) times daily. 04/23/19  Yes [provider]  fluticasone (FLONASE) 50 MCG/ACT nasal spray Place 2 sprays into both nostrils daily as needed for allergies or rhinitis.   Yes [provider]  gabapentin (NEURONTIN) 100 MG capsule Take 100 mg by mouth at bedtime.   Yes [provider]  loratadine (CLARITIN) 10 MG tablet Take 10 mg by mouth daily as needed (seasonal allergies).    Yes [provider]  meclizine (ANTIVERT) 25 MG tablet Take 25 mg by mouth 2 (two) times daily as needed for dizziness.    Yes [provider]  metoprolol succinate (TOPROL-XL) 25 MG 24 hr tablet TAKE 1/2 TABLET BY MOUTH EVERY DAY Patient taking differently: Take 12.5 mg by mouth daily with supper.  01/27/19  Yes Mackay Hanauer Daphine Deutscher, MD  metroNIDAZOLE (METROCREAM) 0.75 % cream Apply 1 application topically 2 (two) times daily as needed (rosacea).  04/27/19  Yes [provider]  Multiple Vitamin (MULTIVITAMIN WITH MINERALS) TABS tablet Take 1 tablet by mouth daily with lunch. Centrum Silver for Women    Yes [provider]  omeprazole (PRILOSEC) 20 MG capsule Take 1 capsule (20 mg total) by mouth daily. Patient taking differently: Take 20 mg by mouth daily with breakfast.  07/27/18  Yes Horton, Mayer Masker, MD  ondansetron (ZOFRAN) 4 MG tablet Take 1 tablet (4 mg total) by mouth every 6 (six) hours as needed. Patient taking differently: Take 4 mg by mouth every 6 (six) hours as needed for nausea or vomiting.  04/24/18  Yes Dione Booze, MD  diltiazem (CARDIZEM CD) 180 MG 24 hr capsule Take 1 capsule (180 mg total) by mouth daily. 04/29/19   Sabas Sous, MD  flecainide (TAMBOCOR) 50 MG tablet Take 1 tablet (50 mg total) by mouth 2 (two) times daily. Patient not taking: Reported on 05/01/2019 09/14/18   Regan Lemming, MD  HYDROcodone-acetaminophen (NORCO/VICODIN) 5-325 MG tablet Take 1 tablet by mouth every 6 (six) hours as needed.  Patient not taking: Reported on 05/01/2019 07/27/18   Shon Baton, MD    Inpatient Medications: Scheduled Meds: . apixaban  5 mg Oral BID  . flecainide  50 mg Oral BID  . gabapentin  100 mg Oral QHS  . pantoprazole  40 mg Oral Daily  . sodium chloride flush  3 mL Intravenous Once   Continuous Infusions: . diltiazem (CARDIZEM) infusion 5 mg/hr (05/01/19 1939)   PRN Meds:   Allergies:    Allergies  Allergen Reactions  . Amiodarone Itching  . Flomax [Tamsulosin Hcl] Nausea And Vomiting    Social History:   Social History   Socioeconomic History  . Marital status: Single    Spouse name: Not on file  . Number of children: 0  . Years of education: College  . Highest education level: Not on file  Occupational History  .  Occupation: retired  Engineer, production  . Financial resource strain: Not on file  . Food insecurity    Worry: Not on file    Inability: Not on file  . Transportation needs    Medical: Not on file    Non-medical: Not on file  Tobacco Use  . Smoking status: Never Smoker  . Smokeless tobacco: Never Used  Substance and Sexual Activity  . Alcohol use: Yes    Comment: rarely; glass of wine with dinner  . Drug use: No  . Sexual activity: Never    Birth control/protection: Surgical    Comment: hyst  Lifestyle  . Physical activity    Days per week: Not on file    Minutes per session: Not on file  . Stress: Not on file  Relationships  . Social Musician on phone: Not on file    Gets together: Not on file    Attends religious service: Not on file    Active member of club or organization: Not on file    Attends meetings of clubs or organizations: Not on file    Relationship status: Not on file  . Intimate partner violence    Fear of current or ex partner: Not on file    Emotionally abused: Not on file    Physically abused: Not on file    Forced sexual activity: Not on file  Other Topics Concern  . Not on file  Social History Narrative    Single. Retired.    Patient lives at home alone.   Caffeine Use: none    Family History:   Family History  Problem Relation Age of Onset  . Coronary artery disease Father        mid 74s  . Hypertension Mother   . Stroke Brother   . Transient ischemic attack Brother   . Other Brother        on oxygen  . Hypertension Brother   . Stroke Brother   . Heart disease Brother   . Heart attack Brother   . Other Sister        a fib, restless leg  . Other Sister        pressure in eyes     ROS:  Please see the history of present illness.  All other ROS reviewed and negative.     Physical Exam/Data:   Vitals:   05/01/19 2030 05/01/19 2104 05/02/19 0036 05/02/19 0506  BP: 128/83 (!) 139/100 139/81 113/62  Pulse: (!) 103 97 87 74  Resp: (!) 22 18 18 18   Temp:  97.8 F (36.6 C) (!) 97.3 F (36.3 C) 97.8 F (36.6 C)  TempSrc:  Oral Oral Oral  SpO2: 96% 100% 100% 95%  Weight:  89.1 kg  88.5 kg  Height:  5\' 7"  (1.702 m)      Intake/Output Summary (Last 24 hours) at 05/02/2019 0814 Last data filed at 05/02/2019 0814 Gross per 24 hour  Intake 521.12 ml  Output 1550 ml  Net -1028.88 ml   Last 3 Weights 05/02/2019 05/01/2019 02/18/2019  Weight (lbs) 195 lb 3.2 oz 196 lb 6.4 oz 190 lb  Weight (kg) 88.542 kg 89.086 kg 86.183 kg     Body mass index is 30.57 kg/m.  General:  Well nourished, well developed, in no acute distress HEENT: normal Lymph: no adenopathy Neck: no JVD Endocrine:  No thryomegaly Vascular: No carotid bruits Cardiac:  irreg-irreg; no murmurs, gallops or rubs Lungs:  CTA b/l,  no wheezing, rhonchi or rales  Abd: soft, nontender, no hepatomegaly  Ext: no edema Musculoskeletal:  No deformities Skin: warm and dry  Neuro:  no focal abnormalities noted Psych:  Normal affect   EKG:  The EKG was personally reviewed and demonstrates:    AFib, 125bpm, RBBB  04/29/2019 AFib 128bpm, RBBB 02/18/2019 SB, 57bpm, borderline 1st degree AVBlock, PR 264ms, RBBB  Telemetry:  Telemetry was personally reviewed and demonstrates:    Reviewed by Dr. Curt Bears, feels current rhythm is an atrial flutter with variable block, V rates controlled 60's  Relevant CV Studies:   08/20/2018: EPS/PVI and CTI ablation CONCLUSIONS: 1. Sinus rhythm upon presentation.   2. Successful electrical isolation and anatomical encircling of all four pulmonary veins with radiofrequency current.    3. Cavo-tricuspid isthmus ablation was performed with complete bidirectional isthmus block achieved.  4. No inducible arrhythmias following ablation both on and off of dobutamine 5. No early apparent complications.  05/20/17: TTE Study Conclusions - Left ventricle: The cavity size was normal. Wall thickness was   normal. Systolic function was normal. The estimated ejection   fraction was in the range of 60% to 65%. Wall motion was normal;   there were no regional wall motion abnormalities. Left   ventricular diastolic function parameters were normal. - Aortic valve: Mildly to moderately calcified annulus. Trileaflet.   There was mild regurgitation. - Mitral valve: Calcified annulus. Normal thickness leaflets .   There was moderate regurgitation. - Tricuspid valve: There was mild regurgitation. - Pulmonary arteries: PA peak pressure: 34 mm Hg (S).   07/03/17: Lexiscan stress test  No diagnostic ST segment changes to indicate ischemia.  Small, mild intensity, basal inferoseptal defect that is fixed and most consistent with soft tissue attenuation.  This is a low risk study.  Nuclear stress EF: 80%.   04/12/2012: LHC Coronary angiography: Coronary dominance: right  Left mainstem: Normal.  Left anterior descending (LAD): Very mild irregularities in the mid vessel less than 10-20%.  Left circumflex (LCx): 20% at the origin. Otherwise no significant disease.  There is a ramus intermediate branch that is modest in size and is normal.  Right coronary artery (RCA):  Normal.  Left ventriculography: Left ventricular systolic function is normal, LVEF is estimated at 55-65%, there is no significant mitral regurgitation   Final Conclusions:   1. Very minor nonobstructive coronary disease. 2. Normal left ventricular function.   Laboratory Data:  High Sensitivity Troponin:   Recent Labs  Lab 04/29/19 1350 04/29/19 1545 05/01/19 1645 05/01/19 2121  TROPONINIHS 12 11 11 17      Chemistry Recent Labs  Lab 04/29/19 1350 05/01/19 1645  NA 142 143  K 4.1 4.1  CL 107 108  CO2 28 26  GLUCOSE 133* 94  BUN 17 18  CREATININE 1.55* 1.56*  CALCIUM 9.3 9.3  GFRNONAA 32* 31*  GFRAA 37* 37*  ANIONGAP 7 9    No results for input(s): PROT, ALBUMIN, AST, ALT, ALKPHOS, BILITOT in the last 168 hours. Hematology Recent Labs  Lab 04/29/19 1350 05/01/19 1645  WBC 6.4 5.4  RBC 4.32 4.10  HGB 14.3 14.0  HCT 44.9 42.5  MCV 103.9* 103.7*  MCH 33.1 34.1*  MCHC 31.8 32.9  RDW 13.8 14.1  PLT 266 222   BNPNo results for input(s): BNP, PROBNP in the last 168 hours.  DDimer No results for input(s): DDIMER in the last 168 hours.   Radiology/Studies:   Dg Chest 2 View Result Date: 04/29/2019 CLINICAL DATA:  Shortness of breath, chest palpitations. EXAM: CHEST - 2 VIEW COMPARISON:  Radiographs of July 26, 2018. FINDINGS: The heart size and mediastinal contours are within normal limits. Both lungs are clear. No pneumothorax or pleural effusion is noted. The visualized skeletal structures are unremarkable. IMPRESSION: No active cardiopulmonary disease. Electronically Signed   By: Lupita Raider M.D.   On: 04/29/2019 14:05    Assessment and Plan:    1.  Persistent Afib      CHA2DS2Vasc is 4, on Eliquis, appropriately dosed  Dr. Elberta Fortis has seen and examined the patient this am and reviewed her telemetry. He feels her rhythm this AM is an Aflutter (not SR)   There was mention of potentially missed dose of Eliquis last week and not DCCV in the ER  on Firday TODAY the patient tells Dr. Elberta Fortis this is incorrect, that she takes her medicines religiously and is certain that she has not missed a dose of her Eliquis specifically.  When I revisit she reports the same, that she is positive she has not missed any Eliquis doses in the last 3 weeks, and longer.   I discussed DCCV procedure, potential benefits and risks, she has never had any anesthesia problems historically and is agreeable to proceed.  Plan: DCCV today Stop lopressor and amlodipine Continue Flecainide  BID and start the Diltiazem  QD and of course her Eliquis  BID   Early f/u with the AFib clinic is in place to f/u on Friday to see how she is doing with change to dilt Dr. Elberta Fortis feels she Triston Skare need repeat/touch up ablation (he mentioned to her this AM), appointment is in place with him to discuss further.    If in SR and stable after cardioversion, OK to discharge home this aftrenoon from EP perspective         For questions or updates, please contact [facility] Please consult [email or web address] for contact info under     Signed, Sheilah Pigeon, PA-C  05/02/2019 8:14 AM  I have seen and examined this patient with Francis Dowse.  Agree with above, note added to reflect my findings.  On exam, tachycardia, regular, lungs clear.  She has a history of atrial fibrillation and atrial flutter and is status post ablation.  She is currently on low-dose flecainide.  She is unfortunately had more atrial flutter over the last few weeks.  She has had atrial fibrillation after ablation as well.  Due to that, we Wyolene Weimann likely need to plan for repeat ablation as she has not tolerated antiarrhythmics well.  Prior to that, due to her weakness and fatigue, Itai Barbian plan for cardioversion.  She does say that she is certain that she has been compliant with her anticoagulation over the last 3 weeks.  She does have follow-up in A. fib clinic.  We Malon Siddall also call her for scheduling for  an appointment for ablation.  Jaekwon Mcclune M. Collins Kerby MD 05/02/2019 10:19 AM

## 2019-05-02 NOTE — Discharge Summary (Signed)
. Physician Discharge Summary  Adriana Reamsla M Esbenshade KVQ:[id number] DOB: 06/19/1941 DOA: 05/01/2019  PCP: Richardean Chimeraaniel, Terry G, MD  Admit date: 05/01/2019 Discharge date: 05/02/2019  Admitted From: Home Disposition:  Discharged to home.   Recommendations for Outpatient Follow-up:  1. Follow up with PCP in 1-2 weeks 2. Please obtain BMP/CBC in one week 3. Follow up with cardiology as scheduled  Discharge Condition: Stable  CODE STATUS: FULL   Brief/Interim Summary: Hailey [name] is a 78 y.o. female with medical history significant of atrial fibrillation on Eliquis and flecainide status post ablation in January 2020, CAD, hypertension, and anxiety who presented with worsening shortness of breath and palpitation.  Patient reports that her symptoms started a month ago with progressive shortness of breath.  She was evaluated in the ED 2 days ago on 9/25 and was given a dose of 60 mg diltiazem and discharged with a prescription of Cardizem.   However her pharmacist advised her not to take her home amlodipine and the new Cardizem at the same time.  She then contacted her cardiologist and was told to stop her amlodipine and to take the Cardizem.  However, she wanted the wait and hear from her primary cardiologist on Monday prior to starting the medication.  However, during this time her palpitation continue to get worse and she felt very nauseous.  She also reports that every morning she feels a "chest pulling and rug burn sensation" that would improve when she gets up out of bed.  She denies any fevers or chills.  Denies any vomiting or abdominal pain.  Denies any new lower extremity edema but does note chronic edema around her bilateral ankles.  Patient endorsed compliant with her Eliquis and does not believe that she has missed any doses, although she states that "she would not remember if she did forget." At the last ED evaluation 2 days ago, she reportedly stated that it was possible she might have missed a dose  of Eliquis in the past month and was deemed a poor cardioversion candidate by cardiology.  Denies any tobacco or illicit drug use.  Endorses occasional alcohol use.   9/28: Now s/p cardioversion. To follow up with cardiology outpt.  Discharge Diagnoses:  Active Problems:   Coronary artery disease involving native heart without angina pectoris   Atrial fibrillation with RVR (HCC)   Essential hypertension   Atrial flutter (HCC)  Atrial fib w/ RVR Atrial flutter     - taken for DC cardioversion this AM; successful     - ok to d/c to home with outpt cards follow up per cardiology     - will continue flecainide 50mg  BID, diltiazem 180mg  qd, and eliquis 5mg  BID  HTN     - dilt  CKD3     - at baseline; follow up with PCP  Discharge Instructions   Allergies as of 05/02/2019      Reactions   Amiodarone Itching   Flomax [tamsulosin Hcl] Nausea And Vomiting      Medication List    STOP taking these medications   amLODipine 2.5 MG tablet Commonly known as: NORVASC   HYDROcodone-acetaminophen 5-325 MG tablet Commonly known as: NORCO/VICODIN   metoprolol succinate 25 MG 24 hr tablet Commonly known as: TOPROL-XL     TAKE these medications   acetaminophen 650 MG CR tablet Commonly known as: TYLENOL Take 650 mg by mouth 2 (two) times daily.   CALCIUM 600+D3 PO Take 1 tablet by mouth daily with lunch.  diltiazem 180 MG 24 hr capsule Commonly known as: Cardizem CD Take 1 capsule (180 mg total) by mouth daily.   Eliquis 5 MG Tabs tablet Generic drug: apixaban Take 5 mg by mouth 2 (two) times daily.   flecainide 50 MG tablet Commonly known as: TAMBOCOR Take 1 tablet (50 mg total) by mouth 2 (two) times daily. What changed: Another medication with the same name was removed. Continue taking this medication, and follow the directions you see here.   fluticasone 50 MCG/ACT nasal spray Commonly known as: FLONASE Place 2 sprays into both nostrils daily as needed for  allergies or rhinitis.   gabapentin 100 MG capsule Commonly known as: NEURONTIN Take 100 mg by mouth at bedtime.   loratadine 10 MG tablet Commonly known as: CLARITIN Take 10 mg by mouth daily as needed (seasonal allergies).   meclizine 25 MG tablet Commonly known as: ANTIVERT Take 25 mg by mouth 2 (two) times daily as needed for dizziness.   metroNIDAZOLE 0.75 % cream Commonly known as: METROCREAM Apply 1 application topically 2 (two) times daily as needed (rosacea).   multivitamin with minerals Tabs tablet Take 1 tablet by mouth daily with lunch. Centrum Silver for Women   omeprazole 20 MG capsule Commonly known as: PRILOSEC Take 1 capsule (20 mg total) by mouth daily. What changed: when to take this   ondansetron 4 MG tablet Commonly known as: ZOFRAN Take 1 tablet (4 mg total) by mouth every 6 (six) hours as needed. What changed: reasons to take this      Follow-up Information    [facility] Follow up on 05/02/2019.   Specialty: Cardiology Why: 05/06/2019 9:00AM Contact information: [address] [id number] mc [address] [place] [address] [phone]       Regan Lemming, MD Follow up.   Specialty: Cardiology Why: 05/23/2019 @ 3:15PM Contact information: [address] [place] [address] [phone]          Allergies  Allergen Reactions  . Amiodarone Itching  . Flomax [Tamsulosin Hcl] Nausea And Vomiting    Consultations:  Cardiology   Procedures/Studies: Dg Chest 2 View  Result Date: 04/29/2019 CLINICAL DATA:  Shortness of breath, chest palpitations. EXAM: CHEST - 2 VIEW COMPARISON:  Radiographs of July 26, 2018. FINDINGS: The heart size and mediastinal contours are within normal limits. Both lungs are clear. No pneumothorax or pleural effusion is noted. The visualized skeletal structures are unremarkable. IMPRESSION: No active cardiopulmonary disease. Electronically Signed   By:  Lupita Raider M.D.   On: 04/29/2019 14:05      Subjective: "I think i'm ready to go."  Discharge Exam: Vitals:   05/02/19 1116 05/02/19 1126  BP: (!) 113/55 (!) 137/57  Pulse: (!) 56 (!) 55  Resp: (!) 23 12  Temp: 97.8 F (36.6 C)   SpO2: 96% 99%   Vitals:   05/02/19 0506 05/02/19 1040 05/02/19 1116 05/02/19 1126  BP: 113/62 (!) 143/64 (!) 113/55 (!) 137/57  Pulse: 74 79 (!) 56 (!) 55  Resp: 18 19 (!) 23 12  Temp: 97.8 F (36.6 C)  97.8 F (36.6 C)   TempSrc: Oral  Oral   SpO2: 95% 98% 96% 99%  Weight: 88.5 kg 88.5 kg    Height:  5\' 7"  (1.702 m)      General: 78 y.o. female resting in bed in NAD Cardiovascular: RRR, +S1, S2, no m/g/r, equal pulses throughout Respiratory: CTABL, no w/r/r, normal WOB GI: BS+,  NDNT, no masses noted, no organomegaly noted MSK: No e/c/c Skin: No rashes, bruises, ulcerations noted Neuro: A&O x 3, no focal deficits Psyc: Appropriate interaction and affect, calm/cooperative     The results of significant diagnostics from this hospitalization (including imaging, microbiology, ancillary and laboratory) are listed below for reference.     Microbiology: Recent Results (from the past 240 hour(s))  Urine culture     Status: None   Collection Time: 04/29/19  5:38 PM   Specimen: Urine, Random  Result Value Ref Range Status   Specimen Description URINE, RANDOM  Final   Special Requests NONE  Final   Culture   Final    NO GROWTH Performed at [facility], [address] [place], [place] [address]    Report Status 04/30/2019 FINAL  Final  SARS CORONAVIRUS 2 (TAT 6-24 HRS) Nasopharyngeal Nasopharyngeal Swab     Status: None   Collection Time: 05/01/19  6:53 PM   Specimen: Nasopharyngeal Swab  Result Value Ref Range Status   SARS Coronavirus 2 NEGATIVE NEGATIVE Final    Comment: (NOTE) SARS-CoV-2 target nucleic acids are NOT DETECTED. The SARS-CoV-2 RNA is generally detectable in upper and lower respiratory specimens during the  acute phase of infection. Negative results do not preclude SARS-CoV-2 infection, do not rule out co-infections with other pathogens, and should not be used as the sole basis for treatment or other patient management decisions. Negative results must be combined with clinical observations, patient history, and epidemiological information. The expected result is Negative. Fact Sheet for Patients: SugarRoll.be Fact Sheet for Healthcare Providers: https://www.woods-mathews.com/ This test is not yet approved or cleared by the [place] FDA and  has been authorized for detection and/or diagnosis of SARS-CoV-2 by FDA under an Emergency Use Authorization (EUA). This EUA will remain  in effect (meaning this test can be used) for the duration of the COVID-19 declaration under Section 56 4(b)(1) of the Act, 21 U.S.C. section 360bbb-3(b)(1), unless the authorization is terminated or revoked sooner. Performed at [facility], [place] [address] [place], [place] [address]      Labs: BNP (last 3 results) No results for input(s): BNP in the last 8760 hours. Basic Metabolic Panel: Recent Labs  Lab 04/29/19 1350 05/01/19 1645 05/02/19 1145  NA 142 143 143  K 4.1 4.1 4.1  CL 107 108 109  CO2 28 26 25   GLUCOSE 133* 94 95  BUN 17 18 14   CREATININE 1.55* 1.56* 1.42*  CALCIUM 9.3 9.3 9.1  MG  --  2.1  --    Liver Function Tests: No results for input(s): AST, ALT, ALKPHOS, BILITOT, PROT, ALBUMIN in the last 168 hours. No results for input(s): LIPASE, AMYLASE in the last 168 hours. No results for input(s): AMMONIA in the last 168 hours. CBC: Recent Labs  Lab 04/29/19 1350 05/01/19 1645 05/02/19 1145  WBC 6.4 5.4 5.4  HGB 14.3 14.0 13.6  HCT 44.9 42.5 42.5  MCV 103.9* 103.7* 102.2*  PLT 266 222 243   Cardiac Enzymes: No results for input(s): CKTOTAL, CKMB, CKMBINDEX, TROPONINI in the last 168 hours. BNP: Invalid input(s):  POCBNP CBG: No results for input(s): GLUCAP in the last 168 hours. D-Dimer No results for input(s): DDIMER in the last 72 hours. Hgb A1c No results for input(s): HGBA1C in the last 72 hours. Lipid Profile No results for input(s): CHOL, HDL, LDLCALC, TRIG, CHOLHDL, LDLDIRECT in the last 72 hours. Thyroid function studies No results for input(s): TSH, T4TOTAL, T3FREE,  THYROIDAB in the last 72 hours.  Invalid input(s): FREET3 Anemia work up No results for input(s): VITAMINB12, FOLATE, FERRITIN, TIBC, IRON, RETICCTPCT in the last 72 hours. Urinalysis    Component Value Date/Time   COLORURINE YELLOW 04/29/2019 1649   APPEARANCEUR CLEAR 04/29/2019 1649   APPEARANCEUR Cloudy (A) 01/24/2015 1336   LABSPEC 1.005 04/29/2019 1649   PHURINE 6.0 04/29/2019 1649   GLUCOSEU NEGATIVE 04/29/2019 1649   HGBUR NEGATIVE 04/29/2019 1649   BILIRUBINUR NEGATIVE 04/29/2019 1649   BILIRUBINUR Negative 01/24/2015 1336   KETONESUR NEGATIVE 04/29/2019 1649   PROTEINUR NEGATIVE 04/29/2019 1649   UROBILINOGEN 0.2 09/29/2009 2034   NITRITE NEGATIVE 04/29/2019 1649   LEUKOCYTESUR MODERATE (A) 04/29/2019 1649   Sepsis Labs Invalid input(s): PROCALCITONIN,  WBC,  LACTICIDVEN Microbiology Recent Results (from the past 240 hour(s))  Urine culture     Status: None   Collection Time: 04/29/19  5:38 PM   Specimen: Urine, Random  Result Value Ref Range Status   Specimen Description URINE, RANDOM  Final   Special Requests NONE  Final   Culture   Final    NO GROWTH Performed at [facility], [address], [place], [place] [address]    Report Status 04/30/2019 FINAL  Final  SARS CORONAVIRUS 2 (TAT 6-24 HRS) Nasopharyngeal Nasopharyngeal Swab     Status: None   Collection Time: 05/01/19  6:53 PM   Specimen: Nasopharyngeal Swab  Result Value Ref Range Status   SARS Coronavirus 2 NEGATIVE NEGATIVE Final    Comment: (NOTE) SARS-CoV-2 target nucleic acids are NOT DETECTED. The SARS-CoV-2 RNA is  generally detectable in upper and lower respiratory specimens during the acute phase of infection. Negative results do not preclude SARS-CoV-2 infection, do not rule out co-infections with other pathogens, and should not be used as the sole basis for treatment or other patient management decisions. Negative results must be combined with clinical observations, patient history, and epidemiological information. The expected result is Negative. Fact Sheet for Patients: HairSlick.no Fact Sheet for Healthcare Providers: quierodirigir.com This test is not yet approved or cleared by the [place] FDA and  has been authorized for detection and/or diagnosis of SARS-CoV-2 by FDA under an Emergency Use Authorization (EUA). This EUA will remain  in effect (meaning this test can be used) for the duration of the COVID-19 declaration under Section 56 4(b)(1) of the Act, 21 U.S.C. section 360bbb-3(b)(1), unless the authorization is terminated or revoked sooner. Performed at [facility], [address], [place], [place] [address]      Time coordinating discharge: 25 minutes  SIGNED:   Teddy Spike, DO  [facility] 05/02/2019, 2:32 PM Pager   If 7PM-7AM, please contact night-coverage [email or web address] Password [email or web address]

## 2019-05-02 NOTE — Discharge Instructions (Signed)

## 2019-05-02 NOTE — Anesthesia Procedure Notes (Signed)
Procedure Name: General with mask airway Date/Time: 05/02/2019 11:11 AM Performed by: Orlie Dakin, CRNA Pre-anesthesia Checklist: Patient identified, Emergency Drugs available, Suction available, Patient being monitored and Timeout performed Patient Re-evaluated:Patient Re-evaluated prior to induction Oxygen Delivery Method: Ambu bag Preoxygenation: Pre-oxygenation with 100% oxygen Induction Type: IV induction

## 2019-05-02 NOTE — Consult Note (Addendum)
Cardiology Consultation:   Patient ID: Hailey [name] MRN: [id number]; DOB: 06/24/1941  Admit date: 05/01/2019 Date of Consult: 05/02/2019  Primary Care Provider: Richardean Chimeraaniel, Terry G, MD Primary Cardiologist: Prentice DockerSuresh Koneswaran, MD  Primary Electrophysiologist:  Wisdom Rickey Jorja LoaMartin Seleena Reimers, MD    Patient Profile:   Hailey [name] is a 78 y.o. female with a hx of HTN, HLDM and persistent AFib who is being seen today for the evaluation of recurrent AFib w/RVR at the request of Dr. Electa SniffBarnett.   AFib Hx: Diagnosed ? Confirmed Oct 2018 and started on amiodarone w/Dr. Purvis SheffieldKoneswaran 03/2018, c/o palpitations, 1 week monitor without AF 04/2019 amio stopped with c/o dizzy spells/nausea >> BB >> hypotension >> Metoprolol and her amlodipine were stopped >> referred to AFib clinic Developed persistent AFib while waiting for amio washout started on low dose BB >> SB 40's QTc felt at baseline [facility]2018 2/2 Bradycardia required stopping her BB  >> Multaq AFib ablation Jan 2020 Feb 2020 AFib clinic note mentions recurrent PAFib post ablation Multaq stopped >> Flecainide 100mg  BID GI symptoms required down titration to 50mg  BID (with low dose lopressor)  History of Present Illness:   Ms. Hailey [name] presented to the ER Friday with palpitations/CP she was found in AFib w/RVR, HS Trop were 12 and 11, (h/o LHC in 2013 with NOD, and negative lexiscan stress test in 2018) no suspicion of ACS.  The ER doctor elicited report of possible missed Eliquis dose and in d/w Dr. Elberta Fortisamnitz, given this, planned to start dilt and f/u with the AFib clinic this week.  The patient did not start the ditiazem with recommendations by her pharmacist to hold the amlodipine when starting the dilt, the patient wanted to confirm with Dr. Elberta Fortisamnitz Monday so made no changes over the weekend.  [facility] with concerns she was still in AFib, nauseous and having palpitations.  LABS K+ 4/1 Mag 2.1  HS Trop 11, 17 WBC 5.4 H/H 14/42 Plts 222  This morning the patient tells Dr. Elberta Fortisamnitz that she is certain that she has not missed any Eliquis doses (in the last 3 weeks if ever), says she is not sure why that was reported and that she takes her medicine "religiously".  She reports no CP, but when in Afib she gets winded easily, currently feels well at rest.     Heart Pathway Score:     Past Medical History:  Diagnosis Date  . Atrial fibrillation (HCC)   . Breast nodule 11/16/2013  . H/O hematuria   . HLD (hyperlipidemia)   . Hypertension   . 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 Migraine headache   . Palpitations   . Restless leg syndrome   . Vaginal atrophy     Past Surgical History:  Procedure Laterality Date  . APPENDECTOMY    . ATRIAL FIBRILLATION ABLATION N/A 08/20/2018   Procedure: ATRIAL FIBRILLATION ABLATION;  Surgeon: Regan Lemmingamnitz, Adamary Savary Martin, MD;  Location: [facility] INVASIVE CV LAB;  Service: Cardiovascular;  Laterality: N/A;  . CARDIAC CATHETERIZATION  2005    cone  . COLONOSCOPY    . HERNIA REPAIR    . LEFT HEART CATHETERIZATION WITH CORONARY ANGIOGRAM N/A 04/12/2012   Procedure: LEFT HEART CATHETERIZATION WITH CORONARY ANGIOGRAM;  Surgeon: Kathleene Hazelhristopher D McAlhany, MD;  [facility];  Service: Cardiovascular;  Laterality: N/A;  . TOTAL ABDOMINAL HYSTERECTOMY       Home Medications:  Prior to Admission medications  Medication Sig Start Date End Date Taking? Authorizing Provider  acetaminophen (TYLENOL) 650 MG CR tablet Take 650 mg by mouth 2 (two) times daily.    Yes [provider]  amLODipine (NORVASC) 2.5 MG tablet Take 2.5 mg by mouth daily with lunch.   Yes [provider]  apixaban (ELIQUIS) 5 MG TABS tablet Take 5 mg by mouth 2 (two) times daily.   Yes [provider]  Calcium Carb-Cholecalciferol (CALCIUM 600+D3 PO) Take 1 tablet by mouth daily with lunch.    Yes [provider]  flecainide (TAMBOCOR) 100 MG  tablet Take 50 mg by mouth 2 (two) times daily. 04/23/19  Yes [provider]  fluticasone (FLONASE) 50 MCG/ACT nasal spray Place 2 sprays into both nostrils daily as needed for allergies or rhinitis.   Yes [provider]  gabapentin (NEURONTIN) 100 MG capsule Take 100 mg by mouth at bedtime.   Yes [provider]  loratadine (CLARITIN) 10 MG tablet Take 10 mg by mouth daily as needed (seasonal allergies).    Yes [provider]  meclizine (ANTIVERT) 25 MG tablet Take 25 mg by mouth 2 (two) times daily as needed for dizziness.    Yes [provider]  metoprolol succinate (TOPROL-XL) 25 MG 24 hr tablet TAKE 1/2 TABLET BY MOUTH EVERY DAY Patient taking differently: Take 12.5 mg by mouth daily with supper.  01/27/19  Yes Leola Fiore Martin, MD  metroNIDAZOLE (METROCREAM) 0.75 % cream Apply 1 application topically 2 (two) times daily as needed (rosacea).  04/27/19  Yes [provider]  Multiple Vitamin (MULTIVITAMIN WITH MINERALS) TABS tablet Take 1 tablet by mouth daily with lunch. Centrum Silver for Women    Yes [provider]  omeprazole (PRILOSEC) 20 MG capsule Take 1 capsule (20 mg total) by mouth daily. Patient taking differently: Take 20 mg by mouth daily with breakfast.  07/27/18  Yes Horton, Courtney F, MD  ondansetron (ZOFRAN) 4 MG tablet Take 1 tablet (4 mg total) by mouth every 6 (six) hours as needed. Patient taking differently: Take 4 mg by mouth every 6 (six) hours as needed for nausea or vomiting.  04/24/18  Yes Glick, David, MD  diltiazem (CARDIZEM CD) 180 MG 24 hr capsule Take 1 capsule (180 mg total) by mouth daily. 04/29/19   Bero, Michael M, MD  flecainide (TAMBOCOR) 50 MG tablet Take 1 tablet (50 mg total) by mouth 2 (two) times daily. Patient not taking: Reported on 05/01/2019 09/14/18   Yassine Brunsman Martin, MD  HYDROcodone-acetaminophen (NORCO/VICODIN) 5-325 MG tablet Take 1 tablet by mouth every 6 (six) hours as needed.  Patient not taking: Reported on 05/01/2019 07/27/18   Horton, Courtney F, MD    Inpatient Medications: Scheduled Meds: . apixaban  5 mg Oral BID  . flecainide  50 mg Oral BID  . gabapentin  100 mg Oral QHS  . pantoprazole  40 mg Oral Daily  . sodium chloride flush  3 mL Intravenous Once   Continuous Infusions: . diltiazem (CARDIZEM) infusion 5 mg/hr (05/01/19 1939)   PRN Meds:   Allergies:    Allergies  Allergen Reactions  . Amiodarone Itching  . Flomax [Tamsulosin Hcl] Nausea And Vomiting    Social History:   Social History   Socioeconomic History  . Marital status: Single    Spouse name: Not on file  . Number of children: 0  . Years of education: College  . Highest education level: Not on file  Occupational History  .   Occupation: retired  Engineer, production  . Financial resource strain: Not on file  . Food insecurity    Worry: Not on file    Inability: Not on file  . Transportation needs    Medical: Not on file    Non-medical: Not on file  Tobacco Use  . Smoking status: Never Smoker  . Smokeless tobacco: Never Used  Substance and Sexual Activity  . Alcohol use: Yes    Comment: rarely; glass of wine with dinner  . Drug use: No  . Sexual activity: Never    Birth control/protection: Surgical    Comment: hyst  Lifestyle  . Physical activity    Days per week: Not on file    Minutes per session: Not on file  . Stress: Not on file  Relationships  . Social Musician on phone: Not on file    Gets together: Not on file    Attends religious service: Not on file    Active member of club or organization: Not on file    Attends meetings of clubs or organizations: Not on file    Relationship status: Not on file  . Intimate partner violence    Fear of current or ex partner: Not on file    Emotionally abused: Not on file    Physically abused: Not on file    Forced sexual activity: Not on file  Other Topics Concern  . Not on file  Social History Narrative    Single. Retired.    Patient lives at home alone.   Caffeine Use: none    Family History:   Family History  Problem Relation Age of Onset  . Coronary artery disease Father        mid 74s  . Hypertension Mother   . Stroke Brother   . Transient ischemic attack Brother   . Other Brother        on oxygen  . Hypertension Brother   . Stroke Brother   . Heart disease Brother   . Heart attack Brother   . Other Sister        a fib, restless leg  . Other Sister        pressure in eyes     ROS:  Please see the history of present illness.  All other ROS reviewed and negative.     Physical Exam/Data:   Vitals:   05/01/19 2030 05/01/19 2104 05/02/19 0036 05/02/19 0506  BP: 128/83 (!) 139/100 139/81 113/62  Pulse: (!) 103 97 87 74  Resp: (!) 22 18 18 18   Temp:  97.8 F (36.6 C) (!) 97.3 F (36.3 C) 97.8 F (36.6 C)  TempSrc:  Oral Oral Oral  SpO2: 96% 100% 100% 95%  Weight:  89.1 kg  88.5 kg  Height:  5\' 7"  (1.702 m)      Intake/Output Summary (Last 24 hours) at 05/02/2019 0814 Last data filed at 05/02/2019 0814 Gross per 24 hour  Intake 521.12 ml  Output 1550 ml  Net -1028.88 ml   Last 3 Weights 05/02/2019 05/01/2019 02/18/2019  Weight (lbs) 195 lb 3.2 oz 196 lb 6.4 oz 190 lb  Weight (kg) 88.542 kg 89.086 kg 86.183 kg     Body mass index is 30.57 kg/m.  General:  Well nourished, well developed, in no acute distress HEENT: normal Lymph: no adenopathy Neck: no JVD Endocrine:  No thryomegaly Vascular: No carotid bruits Cardiac:  irreg-irreg; no murmurs, gallops or rubs Lungs:  CTA b/l,  no wheezing, rhonchi or rales  Abd: soft, nontender, no hepatomegaly  Ext: no edema Musculoskeletal:  No deformities Skin: warm and dry  Neuro:  no focal abnormalities noted Psych:  Normal affect   EKG:  The EKG was personally reviewed and demonstrates:    AFib, 125bpm, RBBB  04/29/2019 AFib 128bpm, RBBB 02/18/2019 SB, 57bpm, borderline 1st degree AVBlock, PR 264ms, RBBB  Telemetry:  Telemetry was personally reviewed and demonstrates:    Reviewed by Dr. Curt Bears, feels current rhythm is an atrial flutter with variable block, V rates controlled 60's  Relevant CV Studies:   08/20/2018: EPS/PVI and CTI ablation CONCLUSIONS: 1. Sinus rhythm upon presentation.   2. Successful electrical isolation and anatomical encircling of all four pulmonary veins with radiofrequency current.    3. Cavo-tricuspid isthmus ablation was performed with complete bidirectional isthmus block achieved.  4. No inducible arrhythmias following ablation both on and off of dobutamine 5. No early apparent complications.  05/20/17: TTE Study Conclusions - Left ventricle: The cavity size was normal. Wall thickness was   normal. Systolic function was normal. The estimated ejection   fraction was in the range of 60% to 65%. Wall motion was normal;   there were no regional wall motion abnormalities. Left   ventricular diastolic function parameters were normal. - Aortic valve: Mildly to moderately calcified annulus. Trileaflet.   There was mild regurgitation. - Mitral valve: Calcified annulus. Normal thickness leaflets .   There was moderate regurgitation. - Tricuspid valve: There was mild regurgitation. - Pulmonary arteries: PA peak pressure: 34 mm Hg (S).   07/03/17: Lexiscan stress test  No diagnostic ST segment changes to indicate ischemia.  Small, mild intensity, basal inferoseptal defect that is fixed and most consistent with soft tissue attenuation.  This is a low risk study.  Nuclear stress EF: 80%.   04/12/2012: LHC Coronary angiography: Coronary dominance: right  Left mainstem: Normal.  Left anterior descending (LAD): Very mild irregularities in the mid vessel less than 10-20%.  Left circumflex (LCx): 20% at the origin. Otherwise no significant disease.  There is a ramus intermediate branch that is modest in size and is normal.  Right coronary artery (RCA):  Normal.  Left ventriculography: Left ventricular systolic function is normal, LVEF is estimated at 55-65%, there is no significant mitral regurgitation   Final Conclusions:   1. Very minor nonobstructive coronary disease. 2. Normal left ventricular function.   Laboratory Data:  High Sensitivity Troponin:   Recent Labs  Lab 04/29/19 1350 04/29/19 1545 05/01/19 1645 05/01/19 2121  TROPONINIHS 12 11 11 17      Chemistry Recent Labs  Lab 04/29/19 1350 05/01/19 1645  NA 142 143  K 4.1 4.1  CL 107 108  CO2 28 26  GLUCOSE 133* 94  BUN 17 18  CREATININE 1.55* 1.56*  CALCIUM 9.3 9.3  GFRNONAA 32* 31*  GFRAA 37* 37*  ANIONGAP 7 9    No results for input(s): PROT, ALBUMIN, AST, ALT, ALKPHOS, BILITOT in the last 168 hours. Hematology Recent Labs  Lab 04/29/19 1350 05/01/19 1645  WBC 6.4 5.4  RBC 4.32 4.10  HGB 14.3 14.0  HCT 44.9 42.5  MCV 103.9* 103.7*  MCH 33.1 34.1*  MCHC 31.8 32.9  RDW 13.8 14.1  PLT 266 222   BNPNo results for input(s): BNP, PROBNP in the last 168 hours.  DDimer No results for input(s): DDIMER in the last 168 hours.   Radiology/Studies:   Dg Chest 2 View Result Date: 04/29/2019 CLINICAL DATA:  Shortness of breath, chest palpitations. EXAM: CHEST - 2 VIEW COMPARISON:  Radiographs of July 26, 2018. FINDINGS: The heart size and mediastinal contours are within normal limits. Both lungs are clear. No pneumothorax or pleural effusion is noted. The visualized skeletal structures are unremarkable. IMPRESSION: No active cardiopulmonary disease. Electronically Signed   By: Lupita Raider M.D.   On: 04/29/2019 14:05    Assessment and Plan:    1.  Persistent Afib      CHA2DS2Vasc is 4, on Eliquis, appropriately dosed  Dr. Elberta Fortis has seen and examined the patient this am and reviewed her telemetry. He feels her rhythm this AM is an Aflutter (not SR)   There was mention of potentially missed dose of Eliquis last week and not DCCV in the ER  on Firday TODAY the patient tells Dr. Elberta Fortis this is incorrect, that she takes her medicines religiously and is certain that she has not missed a dose of her Eliquis specifically.  When I revisit she reports the same, that she is positive she has not missed any Eliquis doses in the last 3 weeks, and longer.   I discussed DCCV procedure, potential benefits and risks, she has never had any anesthesia problems historically and is agreeable to proceed.  Plan: DCCV today Stop lopressor and amlodipine Continue Flecainide  BID and start the Diltiazem  QD and of course her Eliquis  BID   Early f/u with the AFib clinic is in place to f/u on Friday to see how she is doing with change to dilt Dr. Elberta Fortis feels she Abdulraheem Pineo need repeat/touch up ablation (he mentioned to her this AM), appointment is in place with him to discuss further.    If in SR and stable after cardioversion, OK to discharge home this aftrenoon from EP perspective         For questions or updates, please contact [facility] Please consult [email or web address] for contact info under     Signed, Sheilah Pigeon, PA-C  05/02/2019 8:14 AM  I have seen and examined this patient with Francis Dowse.  Agree with above, note added to reflect my findings.  On exam, tachycardia, regular, lungs clear.  She has a history of atrial fibrillation and atrial flutter and is status post ablation.  She is currently on low-dose flecainide.  She is unfortunately had more atrial flutter over the last few weeks.  She has had atrial fibrillation after ablation as well.  Due to that, we Wael Maestas likely need to plan for repeat ablation as she has not tolerated antiarrhythmics well.  Prior to that, due to her weakness and fatigue, Claribel Sachs plan for cardioversion.  She does say that she is certain that she has been compliant with her anticoagulation over the last 3 weeks.  She does have follow-up in A. fib clinic.  We Candra Wegner also call her for scheduling for  an appointment for ablation.  Oral Hallgren M. Mushka Laconte MD 05/02/2019 10:19 AM

## 2019-05-02 NOTE — Interval H&P Note (Signed)
History and Physical Interval Note:  05/02/2019 10:54 AM  Hailey [name]  has presented today for surgery, with the diagnosis of aflutter.  The various methods of treatment have been discussed with the patient and family. After consideration of risks, benefits and other options for treatment, the patient has consented to  Procedure(s): CARDIOVERSION (N/A) as a surgical intervention.  The patient's history has been reviewed, patient examined, no change in status, stable for surgery.  I have reviewed the patient's chart and labs.  Questions were answered to the patient's satisfaction.     Bryndan Bilyk Harrell Gave

## 2019-05-02 NOTE — Anesthesia Preprocedure Evaluation (Addendum)
Anesthesia Evaluation  Patient identified by MRN, date of birth, ID band Patient awake    Reviewed: Allergy & Precautions, NPO status , Patient's Chart, lab work & pertinent test results, reviewed documented beta blocker date and time   History of Anesthesia Complications Negative for: history of anesthetic complications  Airway Mallampati: II  TM Distance: >3 FB Neck ROM: Full    Dental  (+) Dental Advisory Given, Caps   Pulmonary neg pulmonary ROS,  05/01/2019 SARS coronavirus NEG   breath sounds clear to auscultation       Cardiovascular hypertension, Pt. on medications and Pt. on home beta blockers  Rhythm:Irregular Rate:Normal  '18 ECHO: EF 60% to 65%. Wall motion was normal;    Left ventricular diastolic function parameters were normal. - Aortic valve: Mildly to moderately calcified annulus. Trileaflet. Mild AI. - Mitral valve: Calcified annulus. Normal thickness leaflets, mod MR - Tricuspid valve: mild TR.   Neuro/Psych  Headaches, Anxiety meniere's    GI/Hepatic Neg liver ROS, GERD  Medicated and Controlled,  Endo/Other  negative endocrine ROS  Renal/GU Renal InsufficiencyRenal disease     Musculoskeletal   Abdominal   Peds  Hematology eliquis   Anesthesia Other Findings   Reproductive/Obstetrics                            Anesthesia Physical Anesthesia Plan  ASA: III  Anesthesia Plan: General   Post-op Pain Management:    Induction: Intravenous  PONV Risk Score and Plan: 3 and Treatment may vary due to age or medical condition  Airway Management Planned: Natural Airway and Mask  Additional Equipment:   Intra-op Plan:   Post-operative Plan:   Informed Consent: I have reviewed the patients History and Physical, chart, labs and discussed the procedure including the risks, benefits and alternatives for the proposed anesthesia with the patient or authorized representative  who has indicated his/her understanding and acceptance.     Dental advisory given  Plan Discussed with: CRNA and Surgeon  Anesthesia Plan Comments:        Anesthesia Quick Evaluation

## 2019-05-02 NOTE — Transfer of Care (Signed)
Immediate Anesthesia Transfer of Care Note  Patient: Hailey [name]  Procedure(s) Performed: CARDIOVERSION (N/A )  Patient Location: Endoscopy Unit  Anesthesia Type:General  Level of Consciousness: awake  Airway & Oxygen Therapy: Patient Spontanous Breathing and Patient connected to face mask oxygen  Post-op Assessment: Report given to RN and Post -op Vital signs reviewed and stable  Post vital signs: Reviewed and stable  Last Vitals:  Vitals Value Taken Time  BP    Temp    Pulse    Resp    SpO2      Last Pain:  Vitals:   05/02/19 1040  TempSrc:   PainSc: 0-No pain         Complications: No apparent anesthesia complications

## 2019-05-02 NOTE — Anesthesia Postprocedure Evaluation (Signed)
Anesthesia Post Note  Patient: Hailey [name]  Procedure(s) Performed: CARDIOVERSION (N/A )     Patient location during evaluation: Endoscopy Anesthesia Type: General Level of consciousness: awake and alert, patient cooperative and oriented Pain management: pain level controlled Vital Signs Assessment: post-procedure vital signs reviewed and stable Respiratory status: spontaneous breathing, nonlabored ventilation and respiratory function stable Cardiovascular status: blood pressure returned to baseline and stable Postop Assessment: no apparent nausea or vomiting Anesthetic complications: no    Last Vitals:  Vitals:   05/02/19 1116 05/02/19 1126  BP: (!) 113/55 (!) 137/57  Pulse: (!) 56 (!) 55  Resp: (!) 23 12  Temp: 36.6 C   SpO2: 96% 99%    Last Pain:  Vitals:   05/02/19 1126  TempSrc:   PainSc: 0-No pain                 Casady Voshell,E. Brailey Buescher

## 2019-05-02 NOTE — Plan of Care (Signed)

## 2019-05-03 ENCOUNTER — Encounter (HOSPITAL_COMMUNITY): Payer: Self-pay | Admitting: Cardiology

## 2019-05-03 ENCOUNTER — Telehealth: Payer: Self-pay | Admitting: *Deleted

## 2019-05-03 ENCOUNTER — Ambulatory Visit (HOSPITAL_COMMUNITY): Payer: Medicare Other | Admitting: Nurse Practitioner

## 2019-05-03 DIAGNOSIS — I480 Paroxysmal atrial fibrillation: Secondary | ICD-10-CM

## 2019-05-03 NOTE — Telephone Encounter (Signed)
Scheduled repeat ablation for 10/7.  Instructions reviewed with pt.  CT scheduled for tomorrow, instructions reviewed. Covid scheduled for this Saturday. Pt aware office will contact her to arrange post procedure follow up. Patient verbalized understanding and agreeable to plan.

## 2019-05-03 NOTE — Telephone Encounter (Signed)
Called pt to discuss scheduling repeat ablation.  Pt agreeable.  Aware I will look into possibly getting done next week, with CT this week and Covid screening Saturday.  Pt is agreeable to this. Aware I will call her back this afternoon.

## 2019-05-04 ENCOUNTER — Other Ambulatory Visit: Payer: Self-pay

## 2019-05-04 ENCOUNTER — Ambulatory Visit (HOSPITAL_COMMUNITY)
Admission: RE | Admit: 2019-05-04 | Discharge: 2019-05-04 | Disposition: A | Discharge status: Home / Self Care | Payer: Medicare Other | Source: Ambulatory Visit | Attending: Cardiovascular Disease | Admitting: Cardiovascular Disease

## 2019-05-04 ENCOUNTER — Ambulatory Visit (HOSPITAL_COMMUNITY)
Admission: RE | Admit: 2019-05-04 | Discharge: 2019-05-04 | Disposition: A | Discharge status: Home / Self Care | Payer: Medicare Other | Source: Ambulatory Visit | Attending: Cardiology | Admitting: Cardiology

## 2019-05-04 DIAGNOSIS — I480 Paroxysmal atrial fibrillation: Secondary | ICD-10-CM | POA: Insufficient documentation

## 2019-05-04 MED ORDER — SODIUM CHLORIDE 0.9 % WEIGHT BASED INFUSION: 1.0000 mL/kg/h | INTRAVENOUS | Status: DC

## 2019-05-04 MED ORDER — IOHEXOL 350 MG/ML SOLN
80.0000 mL | Freq: Once | INTRAVENOUS | Status: AC | PRN
  Administered 2019-05-04: 16:00:00 80 mL via INTRAVENOUS

## 2019-05-04 MED ORDER — SODIUM CHLORIDE 0.9 % WEIGHT BASED INFUSION
3.0000 mL/kg/h | INTRAVENOUS | Status: AC
  Administered 2019-05-04: 13:00:00 3 mL/kg/h via INTRAVENOUS

## 2019-05-05 ENCOUNTER — Telehealth: Payer: Self-pay | Admitting: Cardiology

## 2019-05-05 NOTE — Telephone Encounter (Signed)
° ° °  Patient calling to report after CT scan yesterday, she began to have SOB, itching all over. She no longer has these symptoms today.  She is concerned about the contrast that was giving. Patient wants to discuss this issue and upcoming ablation

## 2019-05-05 NOTE — Telephone Encounter (Signed)
Pt states she did not have SOB.  She was having some itching yesterday but not experiencing any this morning. Informed that this is a normal SE post contrast and that she may experience up to 24 hours post test.  Advised to take Benadryl if reoccurs today (CT was yesterday afternoon). Advised to call office tomorrow if symptom still persist. Patient verbalized understanding and agreeable to plan.

## 2019-05-06 ENCOUNTER — Ambulatory Visit (HOSPITAL_COMMUNITY): Payer: Medicare Other | Admitting: Nurse Practitioner

## 2019-05-07 ENCOUNTER — Other Ambulatory Visit (HOSPITAL_COMMUNITY)
Admission: RE | Admit: 2019-05-07 | Discharge: 2019-05-07 | Disposition: A | Discharge status: Home / Self Care | Payer: Medicare Other | Source: Ambulatory Visit | Attending: Cardiology | Admitting: Cardiology

## 2019-05-07 DIAGNOSIS — Z01812 Encounter for preprocedural laboratory examination: Secondary | ICD-10-CM | POA: Diagnosis present

## 2019-05-07 DIAGNOSIS — Z20828 Contact with and (suspected) exposure to other viral communicable diseases: Secondary | ICD-10-CM | POA: Insufficient documentation

## 2019-05-09 ENCOUNTER — Telehealth: Payer: Self-pay | Admitting: Cardiology

## 2019-05-09 LAB — NOVEL CORONAVIRUS, NAA (HOSP ORDER, SEND-OUT TO REF LAB; TAT 18-24 HRS): SARS-CoV-2, NAA: NOT DETECTED

## 2019-05-09 NOTE — Telephone Encounter (Signed)
° ° °  Please call patient with pre procedure instructions. 10/7 ablation

## 2019-05-09 NOTE — Telephone Encounter (Signed)
Advised to arrive at 8:30 am for her procedure Wednesday. Patient verbalized understanding and agreeable to plan.

## 2019-05-10 NOTE — Telephone Encounter (Signed)
Pt informed that I called her cellphone before her home number, yesterday. She understands that I didn't call her back, but this was missed call from before I called her home number.

## 2019-05-10 NOTE — Telephone Encounter (Signed)
Follow up   Patient states that she is returning your call from yesterday. Please call.

## 2019-05-11 ENCOUNTER — Ambulatory Visit (HOSPITAL_COMMUNITY)
Admission: RE | Admit: 2019-05-11 | Discharge: 2019-05-12 | Disposition: A | Discharge status: Home / Self Care | Payer: Medicare Other | Source: Ambulatory Visit | Attending: Cardiology | Admitting: Cardiology

## 2019-05-11 ENCOUNTER — Ambulatory Visit (HOSPITAL_COMMUNITY): Payer: Medicare Other | Admitting: Certified Registered"

## 2019-05-11 ENCOUNTER — Encounter (HOSPITAL_COMMUNITY): Payer: Self-pay | Admitting: Certified Registered"

## 2019-05-11 ENCOUNTER — Encounter (HOSPITAL_COMMUNITY)
Admission: RE | Disposition: A | Discharge status: Home / Self Care | Payer: Medicare Other | Source: Ambulatory Visit | Attending: Cardiology

## 2019-05-11 ENCOUNTER — Other Ambulatory Visit: Payer: Self-pay

## 2019-05-11 DIAGNOSIS — Z7901 Long term (current) use of anticoagulants: Secondary | ICD-10-CM | POA: Insufficient documentation

## 2019-05-11 DIAGNOSIS — I4819 Other persistent atrial fibrillation: Secondary | ICD-10-CM | POA: Diagnosis present

## 2019-05-11 DIAGNOSIS — Z79899 Other long term (current) drug therapy: Secondary | ICD-10-CM | POA: Insufficient documentation

## 2019-05-11 HISTORY — PX: ATRIAL FIBRILLATION ABLATION: EP1191

## 2019-05-11 LAB — POCT ACTIVATED CLOTTING TIME
Activated Clotting Time: 335 seconds
Activated Clotting Time: 373 seconds
Activated Clotting Time: 158 seconds

## 2019-05-11 SURGERY — ATRIAL FIBRILLATION ABLATION
Anesthesia: General

## 2019-05-11 MED ORDER — FLECAINIDE ACETATE 50 MG PO TABS
50.0000 mg | ORAL_TABLET | Freq: Two times a day (BID) | ORAL | Status: DC
  Administered 2019-05-11 – 2019-05-12 (×3): 50 mg via ORAL
  Filled 2019-05-11 (×4): qty 1

## 2019-05-11 MED ORDER — ACETAMINOPHEN 325 MG PO TABS
650.0000 mg | ORAL_TABLET | Freq: Two times a day (BID) | ORAL | Status: DC
  Administered 2019-05-12: 09:00:00 650 mg via ORAL
  Filled 2019-05-11: qty 2

## 2019-05-11 MED ORDER — DOBUTAMINE IN D5W 4-5 MG/ML-% IV SOLN
INTRAVENOUS | Status: AC
  Filled 2019-05-11: qty 250

## 2019-05-11 MED ORDER — SODIUM CHLORIDE 0.9 % IV SOLN: 250.0000 mL | INTRAVENOUS | Status: DC | PRN

## 2019-05-11 MED ORDER — BUPIVACAINE HCL (PF) 0.25 % IJ SOLN
INTRAMUSCULAR | Status: AC
  Filled 2019-05-11: qty 30

## 2019-05-11 MED ORDER — HEPARIN (PORCINE) IN NACL 1000-0.9 UT/500ML-% IV SOLN
INTRAVENOUS | Status: AC
  Filled 2019-05-11: qty 500

## 2019-05-11 MED ORDER — SODIUM CHLORIDE 0.9% FLUSH
3.0000 mL | Freq: Two times a day (BID) | INTRAVENOUS | Status: DC
  Administered 2019-05-11 (×2): 3 mL via INTRAVENOUS

## 2019-05-11 MED ORDER — HEPARIN SODIUM (PORCINE) 1000 UNIT/ML IJ SOLN
INTRAMUSCULAR | Status: DC | PRN
  Administered 2019-05-11: 1000 [IU] via INTRAVENOUS

## 2019-05-11 MED ORDER — FLUTICASONE PROPIONATE 50 MCG/ACT NA SUSP
2.0000 | Freq: Every day | NASAL | Status: DC | PRN
  Filled 2019-05-11: qty 16

## 2019-05-11 MED ORDER — FENTANYL CITRATE (PF) 100 MCG/2ML IJ SOLN
25.0000 ug | Freq: Once | INTRAMUSCULAR | Status: AC
  Administered 2019-05-11: 14:00:00 25 ug via INTRAVENOUS

## 2019-05-11 MED ORDER — HEPARIN (PORCINE) IN NACL 1000-0.9 UT/500ML-% IV SOLN
INTRAVENOUS | Status: DC | PRN
  Administered 2019-05-11 (×4): 500 mL

## 2019-05-11 MED ORDER — GABAPENTIN 100 MG PO CAPS
100.0000 mg | ORAL_CAPSULE | Freq: Every day | ORAL | Status: DC
  Administered 2019-05-11: 100 mg via ORAL
  Filled 2019-05-11: qty 1

## 2019-05-11 MED ORDER — PROPOFOL 10 MG/ML IV BOLUS
INTRAVENOUS | Status: DC | PRN
  Administered 2019-05-11: 80 mg via INTRAVENOUS

## 2019-05-11 MED ORDER — ADULT MULTIVITAMIN W/MINERALS CH: 1.0000 | ORAL_TABLET | Freq: Every day | ORAL | Status: DC

## 2019-05-11 MED ORDER — DEXAMETHASONE SODIUM PHOSPHATE 10 MG/ML IJ SOLN
INTRAMUSCULAR | Status: DC | PRN
  Administered 2019-05-11: 5 mg via INTRAVENOUS

## 2019-05-11 MED ORDER — DILTIAZEM HCL ER COATED BEADS 180 MG PO CP24
180.0000 mg | ORAL_CAPSULE | Freq: Every day | ORAL | Status: DC
  Administered 2019-05-11 – 2019-05-12 (×2): 180 mg via ORAL
  Filled 2019-05-11 (×2): qty 1

## 2019-05-11 MED ORDER — FENTANYL CITRATE (PF) 100 MCG/2ML IJ SOLN
INTRAMUSCULAR | Status: AC
  Filled 2019-05-11: qty 2

## 2019-05-11 MED ORDER — ONDANSETRON HCL 4 MG PO TABS: 4.0000 mg | ORAL_TABLET | Freq: Four times a day (QID) | ORAL | Status: DC | PRN

## 2019-05-11 MED ORDER — ACETAMINOPHEN 325 MG PO TABS
ORAL_TABLET | ORAL | Status: AC
  Filled 2019-05-11: qty 2

## 2019-05-11 MED ORDER — BUPIVACAINE HCL (PF) 0.25 % IJ SOLN
INTRAMUSCULAR | Status: DC | PRN
  Administered 2019-05-11: 40 mL

## 2019-05-11 MED ORDER — PANTOPRAZOLE SODIUM 40 MG PO TBEC
40.0000 mg | DELAYED_RELEASE_TABLET | Freq: Every day | ORAL | Status: DC
  Administered 2019-05-11 – 2019-05-12 (×2): 40 mg via ORAL
  Filled 2019-05-11 (×2): qty 1

## 2019-05-11 MED ORDER — SODIUM CHLORIDE 0.9% FLUSH: 3.0000 mL | INTRAVENOUS | Status: DC | PRN

## 2019-05-11 MED ORDER — DOBUTAMINE IN D5W 4-5 MG/ML-% IV SOLN
INTRAVENOUS | Status: DC | PRN
  Administered 2019-05-11: 20 ug/kg/min via INTRAVENOUS

## 2019-05-11 MED ORDER — ACETAMINOPHEN 325 MG PO TABS
650.0000 mg | ORAL_TABLET | ORAL | Status: DC | PRN
  Administered 2019-05-11 (×2): 650 mg via ORAL
  Filled 2019-05-11 (×2): qty 2

## 2019-05-11 MED ORDER — LORATADINE 10 MG PO TABS: 10.0000 mg | ORAL_TABLET | Freq: Every day | ORAL | Status: DC | PRN

## 2019-05-11 MED ORDER — SODIUM CHLORIDE 0.9 % IV SOLN
INTRAVENOUS | Status: DC
  Administered 2019-05-11: 09:00:00 via INTRAVENOUS

## 2019-05-11 MED ORDER — HEPARIN (PORCINE) IN NACL 1000-0.9 UT/500ML-% IV SOLN
INTRAVENOUS | Status: DC | PRN
  Administered 2019-05-11: 500 mL

## 2019-05-11 MED ORDER — FENTANYL CITRATE (PF) 100 MCG/2ML IJ SOLN
INTRAMUSCULAR | Status: DC | PRN
  Administered 2019-05-11: 75 ug via INTRAVENOUS
  Administered 2019-05-11: 25 ug via INTRAVENOUS

## 2019-05-11 MED ORDER — HEPARIN SODIUM (PORCINE) 1000 UNIT/ML IJ SOLN
INTRAMUSCULAR | Status: AC
  Filled 2019-05-11: qty 1

## 2019-05-11 MED ORDER — APIXABAN 5 MG PO TABS
5.0000 mg | ORAL_TABLET | ORAL | Status: AC
  Administered 2019-05-11: 5 mg via ORAL
  Filled 2019-05-11: qty 1

## 2019-05-11 MED ORDER — MECLIZINE HCL 25 MG PO TABS: 25.0000 mg | ORAL_TABLET | Freq: Two times a day (BID) | ORAL | Status: DC | PRN

## 2019-05-11 MED ORDER — APIXABAN 5 MG PO TABS
5.0000 mg | ORAL_TABLET | Freq: Two times a day (BID) | ORAL | Status: DC
  Administered 2019-05-11 – 2019-05-12 (×2): 5 mg via ORAL
  Filled 2019-05-11 (×3): qty 1

## 2019-05-11 MED ORDER — LIDOCAINE 2% (20 MG/ML) 5 ML SYRINGE
INTRAMUSCULAR | Status: DC | PRN
  Administered 2019-05-11: 60 mg via INTRAVENOUS

## 2019-05-11 MED ORDER — HEPARIN SODIUM (PORCINE) 1000 UNIT/ML IJ SOLN
INTRAMUSCULAR | Status: DC | PRN
  Administered 2019-05-11: 14000 [IU] via INTRAVENOUS
  Administered 2019-05-11: 2000 [IU] via INTRAVENOUS

## 2019-05-11 MED ORDER — SUGAMMADEX SODIUM 200 MG/2ML IV SOLN
INTRAVENOUS | Status: DC | PRN
  Administered 2019-05-11: 160 mg via INTRAVENOUS

## 2019-05-11 MED ORDER — ROCURONIUM BROMIDE 10 MG/ML (PF) SYRINGE
PREFILLED_SYRINGE | INTRAVENOUS | Status: DC | PRN
  Administered 2019-05-11: 70 mg via INTRAVENOUS

## 2019-05-11 MED ORDER — ONDANSETRON HCL 4 MG/2ML IJ SOLN: 4.0000 mg | Freq: Four times a day (QID) | INTRAMUSCULAR | Status: DC | PRN

## 2019-05-11 MED ORDER — PROTAMINE SULFATE 10 MG/ML IV SOLN
INTRAVENOUS | Status: DC | PRN
  Administered 2019-05-11: 50 mg via INTRAVENOUS

## 2019-05-11 SURGICAL SUPPLY — 20 items
BLANKET WARM UNDERBOD FULL ACC (MISCELLANEOUS) ×3 IMPLANT
CATH MAPPNG PENTARAY F 2-6-2MM (CATHETERS) ×1 IMPLANT
CATH SMTCH THERMOCOOL SF DF (CATHETERS) ×3 IMPLANT
CATH SOUNDSTAR 3D IMAGING (CATHETERS) ×3 IMPLANT
CATH WEBSTER BI DIR CS D-F CRV (CATHETERS) ×3 IMPLANT
COVER SWIFTLINK CONNECTOR (BAG) ×3 IMPLANT
PACK EP LATEX FREE (CUSTOM PROCEDURE TRAY) ×3
PACK EP LF (CUSTOM PROCEDURE TRAY) ×1 IMPLANT
PAD PRO RADIOLUCENT 2001M-C (PAD) ×3 IMPLANT
PATCH CARTO3 (PAD) ×3 IMPLANT
PENTARAY F 2-6-2MM (CATHETERS) ×3
SHEATH AVANTI 11F 11CM (SHEATH) ×3 IMPLANT
SHEATH BAYLIS SUREFLEX  M 8.5 (SHEATH) ×2
SHEATH BAYLIS SUREFLEX M 8.5 (SHEATH) ×1 IMPLANT
SHEATH BAYLIS TRANSSEPTAL 98CM (NEEDLE) ×3 IMPLANT
SHEATH CARTO VIZIGO SM CVD (SHEATH) ×3 IMPLANT
SHEATH PINNACLE 7F 10CM (SHEATH) ×3 IMPLANT
SHEATH PINNACLE 8F 10CM (SHEATH) ×6 IMPLANT
SHEATH PINNACLE 9F 10CM (SHEATH) ×6 IMPLANT
TUBING SMART ABLATE COOLFLOW (TUBING) ×3 IMPLANT

## 2019-05-11 NOTE — Progress Notes (Signed)
Site area: rt groin fv sheaths x2 Site Prior to Removal:  Level 0 Pressure Applied For:  15 minutes Manual:   yes Patient Status During Pull:  stable Post Pull Site:  Level  0 Post Pull Instructions Given:  yes Post Pull Pulses Present: rt dp palpable Dressing Applied:  Gauze and tegaderm Bedrest begins @  Comments:   

## 2019-05-11 NOTE — Anesthesia Procedure Notes (Signed)
Procedure Name: Intubation Date/Time: 05/11/2019 10:42 AM Performed by: Imagene Riches, CRNA Pre-anesthesia Checklist: Patient identified, Emergency Drugs available, Suction available and Patient being monitored Patient Re-evaluated:Patient Re-evaluated prior to induction Oxygen Delivery Method: Circle System Utilized Preoxygenation: Pre-oxygenation with 100% oxygen Induction Type: IV induction Ventilation: Mask ventilation without difficulty Laryngoscope Size: Miller and 2 Grade View: Grade I Tube type: Oral Tube size: 7.0 mm Number of attempts: 1 Airway Equipment and Method: Stylet and Oral airway Placement Confirmation: ETT inserted through vocal cords under direct vision,  positive ETCO2 and breath sounds checked- equal and bilateral Secured at: 20 cm Tube secured with: Tape Dental Injury: Teeth and Oropharynx as per pre-operative assessment

## 2019-05-11 NOTE — Progress Notes (Signed)
Site area: left groin fv sheaths x2 Site Prior to Removal:  Level 0 Pressure Applied For: 20 minutes Manual:   yes Patient Status During Pull:  stable Post Pull Site:  Level 0 Post Pull Instructions Given:  yes Post Pull Pulses Present: left dp palpable Dressing Applied:  Gauze and tegaderm Bedrest begins @ [id number] Comments:  IV saline locked

## 2019-05-11 NOTE — Anesthesia Preprocedure Evaluation (Addendum)
Anesthesia Evaluation  Patient identified by MRN, date of birth, ID band Patient awake    Reviewed: Allergy & Precautions, NPO status , Patient's Chart, lab work & pertinent test results  Airway Mallampati: III  TM Distance: >3 FB Neck ROM: Full  Mouth opening: Limited Mouth Opening  Dental no notable dental hx. (+) Teeth Intact, Dental Advisory Given   Pulmonary neg pulmonary ROS,    Pulmonary exam normal breath sounds clear to auscultation       Cardiovascular hypertension, + CAD  Normal cardiovascular exam+ dysrhythmias (on flecainide) Atrial Fibrillation  Rhythm:Irregular Rate:Normal  TTE 2018 - Left ventricle: The cavity size was normal. Wall thickness was normal. Systolic function was normal. The estimated ejection fraction was in the range of 60% to 65%. Wall motion was normal; there were no regional wall motion abnormalities. Left ventricular diastolic function parameters were normal. - Aortic valve: Mildly to moderately calcified annulus. Trileaflet. There was mild regurgitation. - Mitral valve: Calcified annulus. Normal thickness leaflets.   There was moderate regurgitation. - Tricuspid valve: There was mild regurgitation. - Pulmonary arteries: PA peak pressure: 34 mm Hg (S).  Stress Test 2018 No diagnostic ST segment changes to indicate ischemia. Small, mild intensity, basal inferoseptal defect that is fixed and most consistent with soft tissue attenuation. This is a low risk study. Nuclear stress EF: 80%   Neuro/Psych  Headaches, PSYCHIATRIC DISORDERS Anxiety    GI/Hepatic Neg liver ROS, GERD  Medicated,  Endo/Other  negative endocrine ROS  Renal/GU negative Renal ROS  negative genitourinary   Musculoskeletal negative musculoskeletal ROS (+)   Abdominal   Peds  Hematology  (+) Blood dyscrasia (on eliquis), ,   Anesthesia Other Findings   Reproductive/Obstetrics                             Anesthesia Physical Anesthesia Plan  ASA: III  Anesthesia Plan: General   Post-op Pain Management:    Induction: Intravenous  PONV Risk Score and Plan: 3 and Dexamethasone and Ondansetron  Airway Management Planned: Oral ETT  Additional Equipment:   Intra-op Plan:   Post-operative Plan: Extubation in OR  Informed Consent: I have reviewed the patients History and Physical, chart, labs and discussed the procedure including the risks, benefits and alternatives for the proposed anesthesia with the patient or authorized representative who has indicated his/her understanding and acceptance.     Dental advisory given  Plan Discussed with: CRNA  Anesthesia Plan Comments:         Anesthesia Quick Evaluation

## 2019-05-11 NOTE — H&P (Signed)
Hailey [name] has presented today for surgery, with the diagnosis of atrial fibrillation.  The various methods of treatment have been discussed with the patient and family. After consideration of risks, benefits and other options for treatment, the patient has consented to  Procedure(s): Catheter ablation as a surgical intervention .  Risks include but not limited to bleeding, tamponade, heart block, stroke, damage to surrounding organs, among others. The patient's history has been reviewed, patient examined, no change in status, stable for surgery.  I have reviewed the patient's chart and labs.  Questions were answered to the patient's satisfaction.    Cherylann Hobday Curt Bears, MD 05/11/2019 10:09 AM

## 2019-05-11 NOTE — Transfer of Care (Signed)
Immediate Anesthesia Transfer of Care Note  Patient: Hailey [name]  Procedure(s) Performed: ATRIAL FIBRILLATION ABLATION (N/A )  Patient Location: PACU  Anesthesia Type:General  Level of Consciousness: awake, alert  and oriented  Airway & Oxygen Therapy: Patient Spontanous Breathing and Patient connected to nasal cannula oxygen  Post-op Assessment: Report given to RN and Post -op Vital signs reviewed and stable  Post vital signs: Reviewed and stable  Last Vitals:  Vitals Value Taken Time  BP 157/90 05/11/19 1313  Temp    Pulse 74 05/11/19 1313  Resp 12 05/11/19 1313  SpO2 100 % 05/11/19 1313  Vitals shown include unvalidated device data.  Last Pain:  Vitals:   05/11/19 0822  TempSrc: Oral      Patients Stated Pain Goal: 3 (77/41/42 3953)  Complications: No apparent anesthesia complications

## 2019-05-12 ENCOUNTER — Encounter (HOSPITAL_COMMUNITY): Payer: Self-pay | Admitting: Cardiology

## 2019-05-12 DIAGNOSIS — I4819 Other persistent atrial fibrillation: Secondary | ICD-10-CM | POA: Diagnosis not present

## 2019-05-12 DIAGNOSIS — Z79899 Other long term (current) drug therapy: Secondary | ICD-10-CM | POA: Diagnosis not present

## 2019-05-12 DIAGNOSIS — Z7901 Long term (current) use of anticoagulants: Secondary | ICD-10-CM | POA: Diagnosis not present

## 2019-05-12 MED ORDER — PANTOPRAZOLE SODIUM 40 MG PO TBEC: 40.0000 mg | DELAYED_RELEASE_TABLET | Freq: Every day | ORAL | 0 refills | Status: DC

## 2019-05-12 NOTE — Plan of Care (Signed)
  Problem: Education: Goal: Understanding of disease, treatment, and recovery process will improve Outcome: Adequate for Discharge   Problem: Activity: Goal: Ability to return to baseline activity level will improve Outcome: Adequate for Discharge   Problem: Cardiac: Goal: Ability to maintain adequate cardiovascular perfusion will improve Outcome: Adequate for Discharge Goal: Vascular access site(s) Level 0-1 will be maintained Outcome: Adequate for Discharge   Problem: Health Behavior/ Discharge Planning: Goal: Ability to safely manage health related needs after discharge Outcome: Adequate for Discharge

## 2019-05-12 NOTE — Progress Notes (Signed)
Discharge order received. Discharge instructions reviewed with patient and given to patient. IV and telemetry removed. Patient taken to private vehicle in wheelchair. No active distress.   Gerarda Fraction, RN

## 2019-05-12 NOTE — Discharge Instructions (Signed)
Cardiac Ablation, Care After  This sheet gives you information about how to care for yourself after your procedure. Your health care provider may also give you more specific instructions. If you have problems or questions, contact your health care provider. What can I expect after the procedure? After the procedure, it is common to have:  Bruising around your puncture site.  Tenderness around your puncture site.  Skipped heartbeats.  Tiredness (fatigue).  Follow these instructions at home: Puncture site care   Follow instructions from your health care provider about how to take care of your puncture site. Make sure you: ? If present, leave stitches (sutures), skin glue, or adhesive strips in place. These skin closures may need to stay in place for up to 2 weeks. If adhesive strip edges start to loosen and curl up, you may trim the loose edges. Do not remove adhesive strips completely unless your health care provider tells you to do that.  You may shower today, 05/12/19.  Do not submerge your wound in a pool, bath, or hot tub for at least 1 week.  Check your puncture site every day for signs of infection. Check for: ? Redness, swelling, or pain. ? Fluid or blood. If your puncture site starts to bleed, lie down on your back, apply firm pressure to the area, and contact your health care provider. ? Warmth. ? Pus or a bad smell. Driving  Do not drive for at least 4 days after your procedure or however long your health care provider recommends.  Do not drive or use heavy machinery while taking prescription pain medicine.  Do not drive for 24 hours if you were given a medicine to help you relax (sedative) during your procedure. Activity  Avoid activities that take a lot of effort for at least 7 days after your procedure.  Do not lift anything that is heavier than 5 lb (4.5 kg) for one week.   No sexual activity for 1 week.   Return to your normal activities as told by your health  care provider. Ask your health care provider what activities are safe for you. General instructions  Take over-the-counter and prescription medicines only as told by your health care provider.  Do not use any products that contain nicotine or tobacco, such as cigarettes and e-cigarettes. If you need help quitting, ask your health care provider.  You may shower after 24 hours, but Do not take baths, swim, or use a hot tub for 1 week.   Do not drink alcohol for 24 hours after your procedure.  Keep all follow-up visits as told by your health care provider. This is important. Contact a health care provider if:  You have redness, mild swelling, or pain around your puncture site.  You have fluid or blood coming from your puncture site that stops after applying firm pressure to the area.  Your puncture site feels warm to the touch.  You have pus or a bad smell coming from your puncture site.  You have a fever.  You have chest pain or discomfort that spreads to your neck, jaw, or arm.  You are sweating a lot.  You feel nauseous.  You have a fast or irregular heartbeat.  You have shortness of breath.  You are dizzy or light-headed and feel the need to lie down.  You have pain or numbness in the arm or leg closest to your puncture site. Get help right away if:  Your puncture site suddenly swells.  Your  puncture site is bleeding and the bleeding does not stop after applying firm pressure to the area. These symptoms may represent a serious problem that is an emergency. Do not wait to see if the symptoms will go away. Get medical help right away. Call your local emergency services (911 in the [place] Do not drive yourself to the hospital. Summary  After the procedure, it is normal to have bruising and tenderness at the puncture site in your groin, neck, or forearm.  Check your puncture site every day for signs of infection.  Get help right away if your puncture site is bleeding and  the bleeding does not stop after applying firm pressure to the area. This is a medical emergency. This information is not intended to replace advice given to you by your health care provider. Make sure you discuss any questions you have with your health care provider.    Heart-Healthy Eating Plan Heart-healthy meal planning includes:  Eating less unhealthy fats.  Eating more healthy fats.  Making other changes in your diet. Talk with your doctor or a diet specialist (dietitian) to create an eating plan that is right for you. What is my plan? Your doctor may recommend an eating plan that includes:  Total fat: ______% or less of total calories a day.  Saturated fat: ______% or less of total calories a day.  Cholesterol: less than _________mg a day. What are tips for following this plan? Cooking Avoid frying your food. Try to bake, boil, grill, or broil it instead. You can also reduce fat by:  Removing the skin from poultry.  Removing all visible fats from meats.  Steaming vegetables in water or broth. Meal planning   At meals, divide your plate into four equal parts: ? Fill one-half of your plate with vegetables and green salads. ? Fill one-fourth of your plate with whole grains. ? Fill one-fourth of your plate with lean protein foods.  Eat 4-5 servings of vegetables per day. A serving of vegetables is: ? 1 cup of raw or cooked vegetables. ? 2 cups of raw leafy greens.  Eat 4-5 servings of fruit per day. A serving of fruit is: ? 1 medium whole fruit. ?  cup of dried fruit. ?  cup of fresh, frozen, or canned fruit. ?  cup of 100% fruit juice.  Eat more foods that have soluble fiber. These are apples, broccoli, carrots, beans, peas, and barley. Try to get 20-30 g of fiber per day.  Eat 4-5 servings of nuts, legumes, and seeds per week: ? 1 serving of dried beans or legumes equals  cup after being cooked. ? 1 serving of nuts is  cup. ? 1 serving of seeds equals 1  tablespoon. General information  Eat more home-cooked food. Eat less restaurant, buffet, and fast food.  Limit or avoid alcohol.  Limit foods that are high in starch and sugar.  Avoid fried foods.  Lose weight if you are overweight.  Keep track of how much salt (sodium) you eat. This is important if you have high blood pressure. Ask your doctor to tell you more about this.  Try to add vegetarian meals each week. Fats  Choose healthy fats. These include olive oil and canola oil, flaxseeds, walnuts, almonds, and seeds.  Eat more omega-3 fats. These include salmon, mackerel, sardines, tuna, flaxseed oil, and ground flaxseeds. Try to eat fish at least 2 times each week.  Check food labels. Avoid foods with trans fats or high amounts of saturated  fat.  Limit saturated fats. ? These are often found in animal products, such as meats, butter, and cream. ? These are also found in plant foods, such as palm oil, palm kernel oil, and coconut oil.  Avoid foods with partially hydrogenated oils in them. These have trans fats. Examples are stick margarine, some tub margarines, cookies, crackers, and other baked goods. What foods can I eat? Fruits All fresh, canned (in natural juice), or frozen fruits. Vegetables Fresh or frozen vegetables (raw, steamed, roasted, or grilled). Green salads. Grains Most grains. Choose whole wheat and whole grains most of the time. Rice and pasta, including brown rice and pastas made with whole wheat. Meats and other proteins Lean, well-trimmed beef, veal, pork, and lamb. Chicken and [place] without skin. All fish and shellfish. Wild duck, rabbit, pheasant, and venison. Egg whites or low-cholesterol egg substitutes. Dried beans, peas, lentils, and tofu. Seeds and most nuts. Dairy Low-fat or nonfat cheeses, including ricotta and mozzarella. Skim or 1% milk that is liquid, powdered, or evaporated. Buttermilk that is made with low-fat milk. Nonfat or low-fat  yogurt. Fats and oils Non-hydrogenated (trans-free) margarines. Vegetable oils, including soybean, sesame, sunflower, olive, peanut, safflower, corn, canola, and cottonseed. Salad dressings or mayonnaise made with a vegetable oil. Beverages Mineral water. Coffee and tea. Diet carbonated beverages. Sweets and desserts Sherbet, gelatin, and fruit ice. Small amounts of dark chocolate. Limit all sweets and desserts. Seasonings and condiments All seasonings and condiments. The items listed above may not be a complete list of foods and drinks you can eat. Contact a dietitian for more options. What foods should I avoid? Fruits Canned fruit in heavy syrup. Fruit in cream or butter sauce. Fried fruit. Limit coconut. Vegetables Vegetables cooked in cheese, cream, or butter sauce. Fried vegetables. Grains Breads that are made with saturated or trans fats, oils, or whole milk. Croissants. Sweet rolls. Donuts. High-fat crackers, such as cheese crackers. Meats and other proteins Fatty meats, such as hot dogs, ribs, sausage, bacon, rib-eye roast or steak. High-fat deli meats, such as salami and bologna. Caviar. Domestic duck and goose. Organ meats, such as liver. Dairy Cream, sour cream, cream cheese, and creamed cottage cheese. Whole-milk cheeses. Whole or 2% milk that is liquid, evaporated, or condensed. Whole buttermilk. Cream sauce or high-fat cheese sauce. Yogurt that is made from whole milk. Fats and oils Meat fat, or shortening. Cocoa butter, hydrogenated oils, palm oil, coconut oil, palm kernel oil. Solid fats and shortenings, including bacon fat, salt pork, lard, and butter. Nondairy cream substitutes. Salad dressings with cheese or sour cream. Beverages Regular sodas and juice drinks with added sugar. Sweets and desserts Frosting. Pudding. Cookies. Cakes. Pies. Milk chocolate or white chocolate. Buttered syrups. Full-fat ice cream or ice cream drinks. The items listed above may not be a  complete list of foods and drinks to avoid. Contact a dietitian for more information. Summary  Heart-healthy meal planning includes eating less unhealthy fats, eating more healthy fats, and making other changes in your diet.  Eat a balanced diet. This includes fruits and vegetables, low-fat or nonfat dairy, lean protein, nuts and legumes, whole grains, and heart-healthy oils and fats. This information is not intended to replace advice given to you by your health care provider. Make sure you discuss any questions you have with your health care provider. Document Released: 01/20/2012 Document Revised: 09/24/2017 Document Reviewed: 08/28/2017 Elsevier Patient Education  2020 [employer].

## 2019-05-12 NOTE — Discharge Summary (Addendum)
ELECTROPHYSIOLOGY PROCEDURE DISCHARGE SUMMARY    Patient ID: Hailey [name],  MRN: [id number], DOB/AGE: November 19, 1940 78 y.o.  Admit date: 05/11/2019 Discharge date: 05/12/2019  Primary Care Physician: Caryl Bis, MD  Primary Cardiologist: Kate Sable, MD  Electrophysiologist: Dr. Curt Bears  Primary Discharge Diagnosis:  Persistent Atrial Fibrillation  Procedures This Admission:  1.  Electrophysiology study and radiofrequency catheter ablation of Atrial Fibrillation on 05/11/2019 by Dr. Curt Bears.  This study demonstrated; i. Comprehensive electrophysiologic study. ii. Coronary sinus pacing and recording. iii. Three-dimensional mapping of atrial fibrillation (with additional mapping and ablation within the left atrium due to persistence of afib) iv. Ablation of atrial fibrillation (with additional mapping and ablation within the left atrium due to persistence of afib) v. Intracardiac echocardiography. vi. Transseptal puncture of an intact septum. vii. Arrhythmia induction with pacing  Brief HPI: Hailey [name] is a 78 y.o. female with a history of persistent Atrial Fibrillation.  They have failed medical therapy with flecainide. Risks, benefits, and alternatives to catheter ablation of Atrial Fibrillation were reviewed with the patient who wished to proceed.  The patient underwent indercardiac echocardiography during the procedure which demonstrated normal LV function and no LAA thrombus.    Hospital Course:  The patient was admitted and underwent EPS/RFCA of Atrial Fibrillation with details as outlined above.  They were monitored on telemetry overnight which demonstrated NSR.  Groin was without complication on the day of discharge.  The patient was examined and considered to be stable for discharge.  Wound care and restrictions were reviewed with the patient.  The patient Hailey [name] be seen back by Adline Peals, PA in 4 weeks and Dr. Curt Bears in 12 weeks for post ablation follow up.    CHA2DS2/VASc is 5.  Physical Exam: Vitals:   05/11/19 1625 05/11/19 1655 05/11/19 1945 05/12/19 0320  BP: 137/79 131/71 131/61 (!) 147/79  Pulse: 83 81 75 73  Resp: 18 17 18 18   Temp:   98.3 F (36.8 C) 97.8 F (36.6 C)  TempSrc:   Oral Oral  SpO2: 99% 99% 100% 98%  Weight:    89.6 kg  Height:        GEN- The patient is well appearing, alert and oriented x 3 today.   HEENT: normocephalic, atraumatic; sclera clear, conjunctiva pink; hearing intact; oropharynx clear; neck supple  Lungs- Clear to ausculation bilaterally, normal work of breathing.  No wheezes, rales, rhonchi Heart- Regular rate and rhythm, no murmurs, rubs or gallops  GI- soft, non-tender, non-distended, bowel sounds present  Extremities- no clubbing, cyanosis, or edema; DP/PT/radial pulses 2+ bilaterally, groin without hematoma/bruit MS- no significant deformity or atrophy Skin- warm and dry, no rash or lesion Psych- euthymic mood, full affect Neuro- strength and sensation are intact  Labs:   Lab Results  Component Value Date   WBC 5.4 05/02/2019   HGB 13.6 05/02/2019   HCT 42.5 05/02/2019   MCV 102.2 (H) 05/02/2019   PLT 243 05/02/2019   No results for input(s): NA, K, CL, CO2, BUN, CREATININE, CALCIUM, PROT, BILITOT, ALKPHOS, ALT, AST, GLUCOSE in the last 168 hours.  Invalid input(s): LABALBU   Discharge Medications:  Allergies as of 05/12/2019      Reactions   Amiodarone Itching   Flomax [tamsulosin Hcl] Nausea And Vomiting      Medication List    STOP taking these medications   omeprazole 20 MG capsule Commonly known as: PRILOSEC Replaced by: pantoprazole 40 MG tablet     TAKE these medications  acetaminophen 650 MG CR tablet Commonly known as: TYLENOL Take 650 mg by mouth 2 (two) times daily.   CALCIUM 600+D3 PO Take 1 tablet by mouth daily with lunch.   diltiazem 180 MG 24 hr capsule Commonly known as: Cardizem CD Take 1 capsule (180 mg total) by mouth daily.   Eliquis 5 MG  Tabs tablet Generic drug: apixaban Take 5 mg by mouth 2 (two) times daily.   flecainide 50 MG tablet Commonly known as: TAMBOCOR Take 1 tablet (50 mg total) by mouth 2 (two) times daily.   fluticasone 50 MCG/ACT nasal spray Commonly known as: FLONASE Place 2 sprays into both nostrils daily as needed for allergies or rhinitis.   gabapentin 100 MG capsule Commonly known as: NEURONTIN Take 100 mg by mouth at bedtime.   loratadine 10 MG tablet Commonly known as: CLARITIN Take 10 mg by mouth daily as needed (seasonal allergies).   meclizine 25 MG tablet Commonly known as: ANTIVERT Take 25 mg by mouth 2 (two) times daily as needed for dizziness.   metroNIDAZOLE 0.75 % cream Commonly known as: METROCREAM Apply 1 application topically 2 (two) times daily as needed (rosacea).   multivitamin with minerals Tabs tablet Take 1 tablet by mouth daily with lunch. Centrum Silver for Women   ondansetron 4 MG tablet Commonly known as: ZOFRAN Take 1 tablet (4 mg total) by mouth every 6 (six) hours as needed. What changed: reasons to take this   pantoprazole 40 MG tablet Commonly known as: PROTONIX Take 1 tablet (40 mg total) by mouth daily. For 6 weeks s/p ablation, then can go back to omeprazole Replaces: omeprazole 20 MG capsule       Disposition:   Follow-up Information    [facility] Follow up on 06/08/2019.   Specialty: Cardiology Why: at 9 am for post ablation check Contact information: [address] [phone] [facility] [address] [place] [address] [phone]       Regan Lemming, MD Follow up on 08/15/2019.   Specialty: Cardiology Why: at 1030 for 3 month post ablation check Contact information: [address] [place] [address] [phone]           Duration of Discharge Encounter: Greater than 30 minutes including physician time.  Signed, Graciella Freer, PA-C  05/12/2019 8:25 AM  I have seen  and examined this patient with Hailey [name].  Agree with above, note added to reflect my findings.  On exam, RRR, no murmurs, lungs clear.  Patient had AF ablation for persistent atrial fibrillation and atrial tachycardia. Tolerated the procedure well without complaint this morning. Plan for discharge today with follow up in EP clinic.  Hailey [name] M. Hailey Pickeral MD 05/12/2019 8:45 AM

## 2019-05-12 NOTE — Anesthesia Postprocedure Evaluation (Signed)
Anesthesia Post Note  Patient: Hailey [name]  Procedure(s) Performed: ATRIAL FIBRILLATION ABLATION (N/A )     Patient location during evaluation: PACU Anesthesia Type: General Level of consciousness: awake and alert Pain management: pain level controlled Vital Signs Assessment: post-procedure vital signs reviewed and stable Respiratory status: spontaneous breathing, nonlabored ventilation, respiratory function stable and patient connected to nasal cannula oxygen Cardiovascular status: blood pressure returned to baseline and stable Postop Assessment: no apparent nausea or vomiting Anesthetic complications: no    Last Vitals:  Vitals:   05/11/19 1945 05/12/19 0320  BP: 131/61 (!) 147/79  Pulse: 75 73  Resp: 18 18  Temp: 36.8 C 36.6 C  SpO2: 100% 98%    Last Pain:  Vitals:   05/12/19 0320  TempSrc: Oral  PainSc:                  Krayton Wortley L Nemiah Kissner

## 2019-05-23 ENCOUNTER — Telehealth: Payer: Self-pay | Admitting: Cardiology

## 2019-05-23 ENCOUNTER — Ambulatory Visit: Payer: Medicare Other | Admitting: Cardiology

## 2019-05-23 MED ORDER — DILTIAZEM HCL ER COATED BEADS 180 MG PO CP24: 180.0000 mg | ORAL_CAPSULE | Freq: Every day | ORAL | 8 refills | Status: DC

## 2019-05-23 NOTE — Telephone Encounter (Signed)
Pt's medication was sent to pt's pharmacy as requested. Confirmation received.  °

## 2019-05-23 NOTE — Telephone Encounter (Signed)
°*  STAT* If patient is at the pharmacy, call can be transferred to refill team.   1. Which medications need to be refilled? (please list name of each medication and dose if known) diltiazem (CARDIZEM CD) 180 MG 24 hr capsule  2. Which pharmacy/location (including street and city if local pharmacy) is medication to be sent to? [id number] - [place], [place]  3. Do they need a 30 day or 90 day supply? 30 day

## 2019-05-31 ENCOUNTER — Telehealth: Payer: Self-pay | Admitting: Cardiology

## 2019-05-31 NOTE — Telephone Encounter (Signed)
New message:    Patient calling to see if she can take her bandages off. Patient would like to be also called on land line if she dose not pick up her cell. [phone]

## 2019-05-31 NOTE — Telephone Encounter (Signed)
Pt advised to take groin bandages off. Advised not to re-cover them. Patient verbalized understanding and agreeable to plan.

## 2019-06-08 ENCOUNTER — Ambulatory Visit (HOSPITAL_COMMUNITY): Payer: Medicare Other | Admitting: Physician Assistant

## 2019-06-22 ENCOUNTER — Other Ambulatory Visit: Payer: Self-pay

## 2019-06-22 ENCOUNTER — Ambulatory Visit (HOSPITAL_COMMUNITY)
Admission: RE | Admit: 2019-06-22 | Discharge: 2019-06-22 | Disposition: A | Discharge status: Home / Self Care | Payer: Medicare Other | Source: Ambulatory Visit | Attending: Physician Assistant | Admitting: Physician Assistant

## 2019-06-22 VITALS — BP 128/72 | HR 63 | Ht 67.0 in | Wt 194.0 lb

## 2019-06-22 DIAGNOSIS — E785 Hyperlipidemia, unspecified: Secondary | ICD-10-CM | POA: Diagnosis not present

## 2019-06-22 DIAGNOSIS — I4892 Unspecified atrial flutter: Secondary | ICD-10-CM | POA: Insufficient documentation

## 2019-06-22 DIAGNOSIS — Z79899 Other long term (current) drug therapy: Secondary | ICD-10-CM | POA: Insufficient documentation

## 2019-06-22 DIAGNOSIS — Z8249 Family history of ischemic heart disease and other diseases of the circulatory system: Secondary | ICD-10-CM | POA: Diagnosis not present

## 2019-06-22 DIAGNOSIS — Z7901 Long term (current) use of anticoagulants: Secondary | ICD-10-CM | POA: Diagnosis not present

## 2019-06-22 DIAGNOSIS — I10 Essential (primary) hypertension: Secondary | ICD-10-CM | POA: Insufficient documentation

## 2019-06-22 DIAGNOSIS — I4819 Other persistent atrial fibrillation: Secondary | ICD-10-CM | POA: Diagnosis not present

## 2019-06-22 DIAGNOSIS — D6869 Other thrombophilia: Secondary | ICD-10-CM | POA: Diagnosis not present

## 2019-06-22 NOTE — Progress Notes (Addendum)
Primary Care Physician: Richardean Chimera, MD Referring Physician: Dr. Purvis Sheffield Primary Cardiologist: Dr Purvis Sheffield Primary EP: Dr Wonda Olds is a 78 y.o. female with a h/o afib that is in the afib clinic for evaluation. She was dx with afib one year ago  and was placed on amiodarone. She did well until 2 weeks ago when she started having random dizzy/nausea episodes. It was felt that amiodarone may be the culprit and it was stopped. She was then placed on Toprol and continued to have worse spells associated with hypotension. For one of these episodes she presented to the ER and found to be hypotensive. BB/and amlodipine were stopped. Sine then she continues to have some of these episodes. Since stopping the BB/CCB, she has not had any further issues with low blood pressure. She has checked her heart rate with episodes and appears to be in normal range. She is in  afib  on initial visit in the afib clinc  but felt ok. She has felt syncopal but no true syncope.Labs in ER within normal limits.  F/u in afib clinic 10/17. Monitor was placed that showed pt was in atrial flutter the whole time with v rates average at 100 bpm, range 41-140. Her last amiodarone level was too high at 1.4, 3 weeks ago, to consider starting another antiarrythmic. Will repeat today. She is still c/o of daily  H/A, intermittent nausea and fatigue. Her PCP worked up H/A recently without any abnormal findings. Discussed trying low dose BB for rate control to see if I can make her feel better until amio wash out. She will have to watch her BP's carefully to look for untoward hypotension.  F/u in Afib clinic, 11/7. She is still struggling with persisitent afib trying to wash out amiodarone. She is tolerating the low dose BB and will increase to have better rate control during the wait. I think she is less symptomatic than when I first started seeing her though. Again discussed ablation, but does not want to commit at  this point.  F/u in afib clinic 12/4. She is in Sinus brady today and is c/o of some shortness of breath and fatigue, possibly for HR in the 40's. Did not realize she was in rhythm today but has not noted hearing any heart beat in her head recently. amiodarone level is pending, still planning on Tikosyn when amio level is low enough.  F/u in the afib clinic, 2/13, s/p ablation 08/20/18. She did have afib after the procedure and initially tried Multaq and did not tolerate. Dr. Elberta Fortis next rx'ed flecainide 100 mg bid and tolerated for the first few days. She then developed GI upset and 2 days ago the dose was decreased to 50 mg bid. She is now tolerating this better. She is in SR. No swallowing or groin issues. Notices low BP readings in the am. Has had some mild pedal edema since starting flecainide, discussed trying a few doses of furosemide but she is reluctant  to start any new meds.  Follow up in the AF clinic 06/22/19. Patient was hospitalized 9/27-9/28/20 for afib with RVR. She was seen by Dr Elberta Fortis during the admission and recommended DCCV followed by repeat ablation. She underwent afib ablation on 05/11/19. Patient reports that she has felt well since her ablation. She did have some brief palpitations immediately after the procedure but these have resolved. She denies CP, swallowing, or groin issues. She is on Eliquis for a CHADS2VASC score of 4.  Today, she denies symptoms of palpitations, chest pain, shortness of breath, orthopnea, PND, lower extremity edema, dizziness, presyncope, syncope, or neurologic sequela. The patient is tolerating medications without difficulties and is otherwise without complaint today.   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   Migraine headache    Palpitations    Restless leg syndrome    Vaginal  atrophy    Past Surgical History:  Procedure Laterality Date   APPENDECTOMY     ATRIAL FIBRILLATION ABLATION N/A 08/20/2018   Procedure: ATRIAL FIBRILLATION ABLATION;  Surgeon: Regan Lemmingamnitz, Will Martin, MD;  Location: [facility];  Service: Cardiovascular;  Laterality: N/A;   ATRIAL FIBRILLATION ABLATION N/A 05/11/2019   Procedure: ATRIAL FIBRILLATION ABLATION;  Surgeon: Regan Lemmingamnitz, Will Martin, MD;  Location: [facility];  Service: Cardiovascular;  Laterality: N/A;   CARDIAC CATHETERIZATION  2005    cone   CARDIOVERSION N/A 05/02/2019   Procedure: CARDIOVERSION;  Surgeon: Jodelle Redhristopher, Bridgette, MD;  [facility];  Service: Cardiovascular;  Laterality: N/A;   COLONOSCOPY     HERNIA REPAIR     LEFT HEART CATHETERIZATION WITH CORONARY ANGIOGRAM N/A 04/12/2012   Procedure: LEFT HEART CATHETERIZATION WITH CORONARY ANGIOGRAM;  Surgeon: Kathleene Hazelhristopher D McAlhany, MD;  [facility];  Service: Cardiovascular;  Laterality: N/A;   TOTAL ABDOMINAL HYSTERECTOMY      Current Outpatient Medications  Medication Sig Dispense Refill   acetaminophen (TYLENOL) 650 MG CR tablet Take 650 mg by mouth 2 (two) times daily.      apixaban (ELIQUIS) 5 MG TABS tablet Take 5 mg by mouth 2 (two) times daily.     Calcium Carb-Cholecalciferol (CALCIUM 600+D3 PO) Take 1 tablet by mouth daily with lunch.      diltiazem (CARDIZEM CD) 180 MG 24 hr capsule Take 1 capsule (180 mg total) by mouth daily. 30 capsule 8   flecainide (TAMBOCOR) 50 MG tablet Take 1 tablet (50 mg total) by mouth 2 (two) times daily. 60 tablet 6   fluticasone (FLONASE) 50 MCG/ACT nasal spray Place 2 sprays into both nostrils daily as needed for allergies or rhinitis.     gabapentin (NEURONTIN) 100 MG capsule Take 100 mg by mouth at bedtime.     loratadine (CLARITIN) 10 MG tablet Take 10 mg by mouth daily as needed (seasonal allergies).      meclizine (ANTIVERT) 25 MG tablet Take 25 mg by mouth 2 (two) times daily  as needed for dizziness.      metroNIDAZOLE (METROCREAM) 0.75 % cream Apply 1 application topically 2 (two) times daily as needed (rosacea).      Multiple Vitamin (MULTIVITAMIN WITH MINERALS) TABS tablet Take 1 tablet by mouth daily with lunch. Centrum Silver for Women      ondansetron (ZOFRAN) 4 MG tablet Take 1 tablet (4 mg total) by mouth every 6 (six) hours as needed. (Patient taking differently: Take 4 mg by mouth every 6 (six) hours as needed for nausea or vomiting. ) 20 tablet 0   pantoprazole (PROTONIX) 40 MG tablet Take 1 tablet (40 mg total) by mouth daily. For 6 weeks s/p ablation, then can go back to omeprazole 56 tablet 0   No current facility-administered medications for this visit.     Allergies  Allergen Reactions   Amiodarone Itching  Flomax [Tamsulosin Hcl] Nausea And Vomiting    Social History   Socioeconomic History   Marital status: Single    Spouse name: Not on file   Number of children: 0   Years of education: College   Highest education level: Not on file  Occupational History   Occupation: retired  Scientist, product/process development strain: Not on file   Food insecurity    Worry: Not on file    Inability: Not on Lexicographer needs    Medical: Not on file    Non-medical: Not on file  Tobacco Use   Smoking status: Never Smoker   Smokeless tobacco: Never Used  Substance and Sexual Activity   Alcohol use: Yes    Comment: rarely; glass of wine with dinner   Drug use: No   Sexual activity: Never    Birth control/protection: Surgical    Comment: hyst  Lifestyle   Physical activity    Days per week: Not on file    Minutes per session: Not on file   Stress: Not on file  Relationships   Social connections    Talks on phone: Not on file    Gets together: Not on file    Attends religious service: Not on file    Active member of club or organization: Not on file    Attends meetings of clubs or organizations: Not on file      Relationship status: Not on file   Intimate partner violence    Fear of current or ex partner: Not on file    Emotionally abused: Not on file    Physically abused: Not on file    Forced sexual activity: Not on file  Other Topics Concern   Not on file  Social History Narrative   Single. Retired.    Patient lives at home alone.   Caffeine Use: none    Family History  Problem Relation Age of Onset   Coronary artery disease Father        mid 36s   Hypertension Mother    Stroke Brother    Transient ischemic attack Brother    Other Brother        on oxygen   Hypertension Brother    Stroke Brother    Heart disease Brother    Heart attack Brother    Other Sister        a fib, restless leg   Other Sister        pressure in eyes    ROS- All systems are reviewed and negative except as per the HPI above  Physical Exam: There were no vitals filed for this visit. Wt Readings from Last 3 Encounters:  05/12/19 197 lb 8.5 oz (89.6 kg)  05/04/19 195 lb (88.5 kg)  05/02/19 195 lb 1.7 oz (88.5 kg)    Labs: Lab Results  Component Value Date   NA 143 05/02/2019   K 4.1 05/02/2019   CL 109 05/02/2019   CO2 25 05/02/2019   GLUCOSE 95 05/02/2019   BUN 14 05/02/2019   CREATININE 1.42 (H) 05/02/2019   CALCIUM 9.1 05/02/2019   MG 2.1 05/01/2019   Lab Results  Component Value Date   INR 1.3 (H) 04/29/2019   No results found for: CHOL, HDL, LDLCALC, TRIG  GEN- The patient is well appearing obese elederly female, alert and oriented x 3 today.   HEENT-head normocephalic, atraumatic, sclera clear, conjunctiva pink, hearing intact, trachea midline. Lungs- Clear to  ausculation bilaterally, normal work of breathing Heart- Regular rate and rhythm, no murmurs, rubs or gallops  GI- soft, NT, ND, + BS Extremities- no clubbing, cyanosis, or edema MS- no significant deformity or atrophy Skin- no rash or lesion Psych- euthymic mood, full affect Neuro- strength and  sensation are intact   EKG- SR HR 63, 1st degree AV block, RBBB, NST, PR 214, QRS 124, QTc 454  Epic records reviewed  Echo-05/2017-Study Conclusions  - Left ventricle: The cavity size was normal. Wall thickness was   normal. Systolic function was normal. The estimated ejection   fraction was in the range of 60% to 65%. Wall motion was normal;   there were no regional wall motion abnormalities. Left   ventricular diastolic function parameters were normal. - Aortic valve: Mildly to moderately calcified annulus. Trileaflet.   There was mild regurgitation. - Mitral valve: Calcified annulus. Normal thickness leaflets .   There was moderate regurgitation. - Tricuspid valve: There was mild regurgitation. - Pulmonary arteries: PA peak pressure: 34 mm Hg (S).   Assessment and Plan: 1. Persistent atrial fibrillation/atrial flutter S/p ablation 1/17 with repeat ablation 05/11/19. Patient appears to be maintaining SR. Continue Eliquis 5 mg BID with no missed doses for at least 3 months post ablation. Continue flecainide 50 mg BID Continue diltiazem 180 mg daily  This patients CHA2DS2-VASc Score and unadjusted Ischemic Stroke Rate (% per year) is equal to 4.8 % stroke rate/year from a score of 4  Above score calculated as 1 point each if present [CHF, HTN, DM, Vascular=MI/PAD/Aortic Plaque, Age if 65-74, or Female] Above score calculated as 2 points each if present [Age > 75, or Stroke/TIA/TE]  2. HTN Stable, no changes today.   Follow up with Dr Elberta Fortis as scheduled.   Jorja Loa PA-C Afib Clinic [facility] [address] [place], [place] [address] [phone]

## 2019-07-08 ENCOUNTER — Telehealth: Payer: Self-pay | Admitting: Cardiology

## 2019-07-08 NOTE — Telephone Encounter (Signed)
Joy from [facility] is requesting clinical documentation to get prior auth for a procedure that is to be done at our office. She would like clinicals by closing of business day today. Fax: [phone] REF# [id number]

## 2019-07-18 ENCOUNTER — Telehealth: Payer: Self-pay | Admitting: Cardiology

## 2019-07-18 NOTE — Telephone Encounter (Signed)
Informed pt that I would forward to billing department to address.  She is aware they will call her back on this matter.

## 2019-07-18 NOTE — Telephone Encounter (Signed)
  Patient is calling because she got a letter of denial from [facility] for her cardiac ablation. They stated they did not get the proper documentation to get approval. She would like to speak with someone about this issue.

## 2019-07-23 ENCOUNTER — Other Ambulatory Visit (HOSPITAL_COMMUNITY): Payer: Self-pay | Admitting: Student

## 2019-08-10 ENCOUNTER — Telehealth: Payer: Self-pay | Admitting: Cardiology

## 2019-08-10 NOTE — Telephone Encounter (Signed)
Informed pt that to be safe lets move her appt out a few weeks, pt agreeable. Rescheduled to 08/30/19

## 2019-08-10 NOTE — Telephone Encounter (Signed)
Patient calling stating on 12/6 she was exposed to someone with COVID, but never had any symptoms. On 12/23 she had the shingles shot and on 12/24 she started getting a headache and nausea. She states it increased though Sunday 12/27 when it peaked. From then to today 1/6 it has gotten better but she still has the headache and nausea that comes and goes. She says she was tested for COVID and was negative. She is unsure if these symptoms are from the shingles shot or COVID. She would like a call back on whether she should come to her appointment 1/11.

## 2019-08-15 ENCOUNTER — Ambulatory Visit: Payer: Medicare Other | Admitting: Cardiology

## 2019-08-30 ENCOUNTER — Other Ambulatory Visit: Payer: Self-pay

## 2019-08-30 ENCOUNTER — Encounter: Payer: Self-pay | Admitting: Cardiology

## 2019-08-30 ENCOUNTER — Ambulatory Visit: Payer: Medicare PPO | Admitting: Cardiology

## 2019-08-30 VITALS — BP 126/72 | HR 65 | Ht 67.0 in | Wt 198.6 lb

## 2019-08-30 DIAGNOSIS — I4819 Other persistent atrial fibrillation: Secondary | ICD-10-CM | POA: Diagnosis not present

## 2019-08-30 NOTE — Progress Notes (Signed)
Electrophysiology Office Note   Date:  08/30/2019   ID:  Hailey [name], Hailey [name] [birth year], MRN [id number]  PCP:  Richardean Chimera, MD  Cardiologist: Purvis Sheffield Primary Electrophysiologist:  Jayquon Theiler Jorja Loa, MD    No chief complaint on file.    History of Present Illness: Hailey [name] is a 79 y.o. female who is being seen today for the evaluation of atrial fibrillation at the request of Richardean Chimera, MD. Presenting today for electrophysiology evaluation.  She has a history of atrial fibrillation on amiodarone.  She was admitted to the hospital with dizziness, nausea, weakness, and near syncope.  She was noted to be in sinus bradycardia with heart rates in the 40s to 50s.  Her home Lopressor was stopped which improved her symptoms.  She is now status post AF ablation x2 with the most recent being 05/11/2019.  Today, denies symptoms of palpitations, chest pain, shortness of breath, orthopnea, PND, lower extremity edema, claudication, dizziness, presyncope, syncope, bleeding, or neurologic sequela. The patient is tolerating medications without difficulties.  Overall she is feeling well.  She has no chest pain or shortness of breath.  She has had some infrequent palpitations that mainly occur in the mornings.  She says that it feels like animals moving across her chest.  At times she feels this in her abdomen as well.  Aside from that she has felt well without any complaints.    Past Medical History:  Diagnosis Date  . Atrial fibrillation (HCC)   . Breast nodule 11/16/2013  . H/O hematuria   . HLD (hyperlipidemia)   . Hypertension   . 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 Migraine headache   . Palpitations   . Restless leg syndrome   . Vaginal atrophy    Past Surgical History:  Procedure Laterality Date  . APPENDECTOMY    . ATRIAL FIBRILLATION ABLATION N/A 08/20/2018   Procedure: ATRIAL FIBRILLATION ABLATION;  Surgeon: Regan Lemming, MD;  Location: [facility] INVAS[facility] CV  LAB;  Service: Cardiovascular;  Laterality: N/A;  . ATRIAL FIBRILLATION ABLATION N/A 05/11/2019   Procedure: ATRIAL FIBRILLATION ABLATION;  Surgeon: Regan Lemming, MD;  Location: [facility] INVAS[facility] CV LAB;  Service: Cardiovascular;  Laterality: N/A;  . CARDIAC CATHETERIZATION  2005    cone  . CARDIOVERSION N/A 05/02/2019   Procedure: CARDIOVERSION;  Surgeon: Jodelle Red, MD;  [facility] ENDOSCOPY;  Service: Cardiovascular;  Laterality: N/A;  . COLONOSCOPY    . HERNIA REPAIR    . LEFT HEART CATHETERIZATION WITH CORONARY ANGIOGRAM N/A 04/12/2012   Procedure: LEFT HEART CATHETERIZATION WITH CORONARY ANGIOGRAM;  Surgeon: Kathleene Hazel, MD;  Location[facility] CATH LAB;  Service: Cardiovascular;  Laterality: N/A;  . TOTAL ABDOMINAL HYSTERECTOMY       Current Outpatient Medications  Medication Sig Dispense Refill  . acetaminophen (TYLENOL) 650 MG CR tablet Take 650 mg by mouth 2 (two) times daily.     Marland Kitchen apixaban (ELIQUIS) 5 MG TABS tablet Take 5 mg by mouth 2 (two) times daily.    . Calcium Carb-Cholecalciferol (CALCIUM 600+D3 PO) Take 1 tablet by mouth daily with lunch.     . diltiazem (CARDIZEM CD) 180 MG 24 hr capsule Take 1 capsule (180 mg total) by mouth daily. 30 capsule 8  . flecainide (TAMBOCOR) 50 MG tablet Take 1 tablet (50 mg total) by mouth 2 (two) times daily. 60 tablet 6  . fluticasone (FLONASE) 50 MCG/ACT nasal spray Place 2 sprays into both  nostrils daily as needed for allergies or rhinitis.    Marland Kitchen gabapentin (NEURONTIN) 100 MG capsule Take 100 mg by mouth at bedtime.    . hydrocortisone (ANUSOL-HC) 2.5 % rectal cream 2 (two) times daily.     Marland Kitchen loratadine (CLARITIN) 10 MG tablet Take 10 mg by mouth daily as needed (seasonal allergies).     . meclizine (ANTIVERT) 25 MG tablet Take 25 mg by mouth 2 (two) times daily as needed for dizziness.     . metroNIDAZOLE (METROCREAM) 0.75 % cream Apply 1 application topically 2 (two) times daily as needed (rosacea).     . Multiple  Vitamin (MULTIVITAMIN WITH MINERALS) TABS tablet Take 1 tablet by mouth daily with lunch. Centrum Silver for Women     . omeprazole (PRILOSEC) 20 MG capsule Take 20 mg by mouth daily.    . ondansetron (ZOFRAN) 4 MG tablet Take 1 tablet (4 mg total) by mouth every 6 (six) hours as needed. 20 tablet 0   No current facility-administered medications for this visit.    Allergies:   Amiodarone and Flomax [tamsulosin hcl]   Social History:  The patient  reports that she has never smoked. She has never used smokeless tobacco. She reports current alcohol use. She reports that she does not use drugs.   Family History:  The patient's family history includes Coronary artery disease in her father; Heart attack in her brother; Heart disease in her brother; Hypertension in her brother and mother; Other in her brother, sister, and sister; Stroke in her brother and brother; Transient ischemic attack in her brother.   ROS:  Please see the history of present illness.   Otherwise, review of systems is positive for none.   All other systems are reviewed and negative.   PHYSICAL EXAM: VS:  BP 126/72   Pulse 65   Ht 5\' 7"  (1.702 m)   Wt 198 lb 9.6 oz (90.1 kg)   SpO2 99%   BMI 31.11 kg/m  , BMI Body mass index is 31.11 kg/m. GEN: Well nourished, well developed, in no acute distress  HEENT: normal  Neck: no JVD, carotid bruits, or masses Cardiac: RRR; no murmurs, rubs, or gallops,no edema  Respiratory:  clear to auscultation bilaterally, normal work of breathing GI: soft, nontender, nondistended, + BS MS: no deformity or atrophy  Skin: warm and dry Neuro:  Strength and sensation are intact Psych: euthymic mood, full affect  EKG:  EKG is ordered today. Personal review of the ekg ordered shows sinus rhythm, right bundle branch block, rate 65   Recent Labs: 05/01/2019: Magnesium 2.1 05/02/2019: BUN 14; Creatinine, Ser 1.42; Hemoglobin 13.6; Platelets 243; Potassium 4.1; Sodium 143    Lipid Panel  No  results found for: CHOL, TRIG, HDL, CHOLHDL, VLDL, LDLCALC, LDLDIRECT   Wt Readings from Last 3 Encounters:  08/30/19 198 lb 9.6 oz (90.1 kg)  06/22/19 194 lb (88 kg)  05/12/19 197 lb 8.5 oz (89.6 kg)      Other studies Reviewed: Additional studies/ records that were reviewed today include: TTE 05/20/2017 Review of the above records today demonstrates:  - Left ventricle: The cavity size was normal. Wall thickness was   normal. Systolic function was normal. The estimated ejection   fraction was in the range of 60% to 65%. Wall motion was normal;   there were no regional wall motion abnormalities. Left   ventricular diastolic function parameters were normal. - Aortic valve: Mildly to moderately calcified annulus. Trileaflet.   There was  mild regurgitation. - Mitral valve: Calcified annulus. Normal thickness leaflets .   There was moderate regurgitation. - Tricuspid valve: There was mild regurgitation. - Pulmonary arteries: PA peak pressure: 34 mm Hg (S).   ASSESSMENT AND PLAN:  1.  Persistent atrial fibrillation: Currently on flecainide and Eliquis.  Status post ablation 08/20/2018 with repeat ablation 05/11/2019.  CHA2DS2-VASc of 3.  She remains in sinus rhythm.  She is felt a few episodes of palpitations which she thinks may be atrial fibrillation or flutter.  I have told her to get an alive core monitor to see if she can catch any recordings that she does not wish to wear a wearable monitor.  2.  Hypertension: Currently well controlled  Current medicines are reviewed at length with the patient today.   The patient does not have concerns regarding her medicines.  The following changes were made today: None  Labs/ tests ordered today include:  Orders Placed This Encounter  Procedures  . EKG 12-Lead     Disposition:   FU with Donis Kotowski 6 months  Signed, Tarin Johndrow Meredith Leeds, MD  08/30/2019 3:07 PM     [place] [address] [place]  [address] [phone] (office) [phone] (fax)

## 2019-08-30 NOTE — Patient Instructions (Signed)
Medication Instructions:  Your physician recommends that you continue on your current medications as directed. Please refer to the Current Medication list given to you today.  *If you need a refill on your cardiac medications before your next appointment, please call your pharmacy*  Lab Work: None ordered If you have labs (blood work) drawn today and your tests are completely normal, you will receive your results only by: Marland Kitchen MyChart Message (if you have MyChart) OR . A paper copy in the mail If you have any lab test that is abnormal or we need to change your treatment, we will call you to review the results.  Testing/Procedures: None ordered  Follow-Up: At [facility], you and your health needs are our priority.  As part of our continuing mission to provide you with exceptional heart care, we have created designated Provider Care Teams.  These Care Teams include your primary Cardiologist (physician) and Advanced Practice Providers (APPs -  Physician Assistants and Nurse Practitioners) who all work together to provide you with the care you need, when you need it.  Your next appointment:   6 month(s)  The format for your next appointment:   In Person  Provider:   Loman Brooklyn, MD  Other Instructions  AliveCor monitor: [email or web address]      FDA-cleared, clinical grade mobile EKG monitor: Lourena Simmonds is the most clinically-validated mobile EKG used by the world's leading cardiac care medical professionals. With basic service you know instantly if your heart rhythm is normal or if arrythmia is detected. You can email the last single EKG recording to yourself or use your doctor Premium service. Premium service is available for purchase through the [employer] app for $9.99 per month or $99 per year & includes unlimited history and storage of your EKG recordings, a monthly EKG summary report to share with your doctor, along with the ability to track your blood pressure, activity and  weight.

## 2019-09-09 ENCOUNTER — Telehealth: Payer: Self-pay | Admitting: Cardiology

## 2019-09-09 NOTE — Telephone Encounter (Signed)
New Message     Pt is calling and would like for Dr Elberta Fortis nurse to call her.  She saw her PCP and wants to hold off on allergies for the time being. They think she has  a root canal problem and if that doesnt work she will consider doing the Allergon     Please call

## 2019-09-09 NOTE — Telephone Encounter (Signed)
Pt reports an intermittent ongoing issues with "flare ups":  Facial puffiness, mostly right sided  Mouth puffy/ithcing  Edge of tongue stings/burns  She saw her PCP last week who gave her a 5 day course of Prednisone.  She ended up stopping this d/t "racing heart". She states her PCP thinks an "underlying condition triggered this" but pt cannot remember what the condition was he was referring to.  Reviewed w/ pharmD. Pt informed that at this time we do not believe it is a cardiac medication causing this re-occurring issue being that she has been on her cardiac medications for greater than 6 months w/ no SE. Pt advised to further discuss w/ PCP and consider seeing an allergist d/t ongoing recurrent "flare ups". Patient verbalized understanding and agreeable to plan.

## 2019-09-09 NOTE — Telephone Encounter (Signed)
New message   Patient states that she is having swelling in mouth and is itching. Patient thinks that she is allergic to one of the heart medications. Patient states that she had this for about 3 weeks. Please call.

## 2019-09-12 NOTE — Telephone Encounter (Signed)
Followed up w/ pt. She informs me she is doing good today.  She is going to see an endodontist soon for evaluation. She will call back if she needs to re-discuss any issues in the future.

## 2019-09-20 ENCOUNTER — Telehealth: Payer: Self-pay | Admitting: Cardiology

## 2019-09-20 NOTE — Telephone Encounter (Signed)
This is a tough one because I cannot completely rule out mediation cause. I am least suspicious of Eliqiuis. I would probably suggest holding diltiazem. Can we put her on low dose metoprolol instead?  Would also like to know what the PCP thought when he saw her the first time

## 2019-09-20 NOTE — Telephone Encounter (Signed)
Will forward to pharmD to review medication list again.  (we informed pt on 2/5 that we did not feel this is medication related). Please advise of any medication she may be taking that may cause the reported issues/SE.

## 2019-09-20 NOTE — Telephone Encounter (Signed)
Patient is calling requesting to speak with Dory Horn in regards to her head still hurting, face still being puffy, and having some itching on the right side of her face. She is requesting her heart medications are looked over again due to it being the only medication she is taking at this time and not knowing of anything else it could be. Please advise.

## 2019-09-21 NOTE — Telephone Encounter (Signed)
Discussed w/ pt.   Informed that this could be related to her rosacea.  She reports she has not used her PRN cream for this yet.  Pt advised to use her cream to see if improves.  Advised to call Monday if issue persists, after treating rosacea. Informed that if she calls next week we will discuss holding Diltiazem w/ Camnitz. Patient verbalized understanding and agreeable to plan.

## 2019-09-26 NOTE — Telephone Encounter (Signed)
Patient still has a slight headache but it's better than last week.  Face is not swollen or puffy anymore. Mouth still feels a little puffy. Stinging and burning is gone. She has a new development left side mouth and face jumps from time to time. She saw her PCP on Saturday, he is ordering a MRI to check things out. The heart was in perfect rhythm, BP and pulse was fine. He asked that her medication not be changed since her heart and vitals were all fine. He agrees with [place] that whatever is going on is not the medication or heart related. He does not want to start any new problems by changing her medications. So if it's okay with Dr. Elberta Fortis lets leave the medications as they are. When the results of the MRI come back patient will call us. Patient thanks you for all your help.

## 2019-10-05 DIAGNOSIS — R220 Localized swelling, mass and lump, head: Secondary | ICD-10-CM | POA: Diagnosis not present

## 2019-10-05 DIAGNOSIS — G501 Atypical facial pain: Secondary | ICD-10-CM | POA: Diagnosis not present

## 2019-10-05 DIAGNOSIS — R519 Headache, unspecified: Secondary | ICD-10-CM | POA: Diagnosis not present

## 2019-10-05 DIAGNOSIS — J329 Chronic sinusitis, unspecified: Secondary | ICD-10-CM | POA: Diagnosis not present

## 2019-10-16 ENCOUNTER — Other Ambulatory Visit: Payer: Self-pay | Admitting: Cardiology

## 2019-10-18 ENCOUNTER — Other Ambulatory Visit: Payer: Self-pay | Admitting: Cardiology

## 2019-10-18 MED ORDER — FLECAINIDE ACETATE 50 MG PO TABS: 50.0000 mg | ORAL_TABLET | Freq: Two times a day (BID) | ORAL | 8 refills | Status: DC

## 2019-10-18 NOTE — Telephone Encounter (Signed)
New Message:    Pt wants to know why her Flecainide prescription was denied please.

## 2019-10-18 NOTE — Telephone Encounter (Signed)
Pt's medication was sent to pt's pharmacy as requested. Confirmation received.  °

## 2019-10-19 DIAGNOSIS — L820 Inflamed seborrheic keratosis: Secondary | ICD-10-CM | POA: Diagnosis not present

## 2019-10-19 DIAGNOSIS — Z85828 Personal history of other malignant neoplasm of skin: Secondary | ICD-10-CM | POA: Diagnosis not present

## 2019-10-19 DIAGNOSIS — L821 Other seborrheic keratosis: Secondary | ICD-10-CM | POA: Diagnosis not present

## 2019-10-19 DIAGNOSIS — D225 Melanocytic nevi of trunk: Secondary | ICD-10-CM | POA: Diagnosis not present

## 2019-10-19 DIAGNOSIS — D1801 Hemangioma of skin and subcutaneous tissue: Secondary | ICD-10-CM | POA: Diagnosis not present

## 2019-10-19 DIAGNOSIS — L814 Other melanin hyperpigmentation: Secondary | ICD-10-CM | POA: Diagnosis not present

## 2019-10-19 DIAGNOSIS — L570 Actinic keratosis: Secondary | ICD-10-CM | POA: Diagnosis not present

## 2019-10-20 DIAGNOSIS — K2100 Gastro-esophageal reflux disease with esophagitis, without bleeding: Secondary | ICD-10-CM | POA: Diagnosis not present

## 2019-10-20 DIAGNOSIS — I10 Essential (primary) hypertension: Secondary | ICD-10-CM | POA: Diagnosis not present

## 2019-10-20 DIAGNOSIS — E782 Mixed hyperlipidemia: Secondary | ICD-10-CM | POA: Diagnosis not present

## 2019-10-20 DIAGNOSIS — R5382 Chronic fatigue, unspecified: Secondary | ICD-10-CM | POA: Diagnosis not present

## 2019-10-20 DIAGNOSIS — N183 Chronic kidney disease, stage 3 unspecified: Secondary | ICD-10-CM | POA: Diagnosis not present

## 2019-10-26 DIAGNOSIS — E042 Nontoxic multinodular goiter: Secondary | ICD-10-CM | POA: Diagnosis not present

## 2019-10-26 DIAGNOSIS — I340 Nonrheumatic mitral (valve) insufficiency: Secondary | ICD-10-CM | POA: Diagnosis not present

## 2019-10-26 DIAGNOSIS — I10 Essential (primary) hypertension: Secondary | ICD-10-CM | POA: Diagnosis not present

## 2019-10-26 DIAGNOSIS — Z6830 Body mass index (BMI) 30.0-30.9, adult: Secondary | ICD-10-CM | POA: Diagnosis not present

## 2019-10-26 DIAGNOSIS — E782 Mixed hyperlipidemia: Secondary | ICD-10-CM | POA: Diagnosis not present

## 2019-10-26 DIAGNOSIS — G2581 Restless legs syndrome: Secondary | ICD-10-CM | POA: Diagnosis not present

## 2019-11-01 ENCOUNTER — Telehealth: Payer: Self-pay | Admitting: Cardiology

## 2019-11-01 NOTE — Telephone Encounter (Signed)
Pt reports 2 weeks ago she woke up in the middle of the night with a burning in the middle of her chest. Occurred a few more times, then eased off when taking Tums/rolaids/prilosec.  Reoccurred again over weekend and so she called cardiology to r/o concern.   She reports that she is burping more & stomach gurgles. Hx reflux and hiatal hernia. Denies current CP. Denies any other heart related symptoms such as radiating pain, SOB, dizziness, fatigue, etc. Pt advised that description does not sound heart related but more reflux related.  Advised to contact PCP to further discuss/evaluate.  advised if they feel she needs further cardiology work up on this matter to call Dr. Junius Argyle office to discuss. Patient verbalized understanding and agreeable to plan.

## 2019-11-01 NOTE — Telephone Encounter (Signed)
    Pt c/o of Chest Pain: STAT if CP now or developed within 24 hours  1. Are you having CP right now? No  2. Are you experiencing any other symptoms (ex. SOB, nausea, vomiting, sweating)? None  3. How long have you been experiencing CP? Started Saturday night a week ago, she felt fine then another chest pain on Sunday    4. Is your CP continuous or coming and going? Coming and going   5. Have you taken Nitroglycerin? dont have any  Pt is adamant to speak with Sherri, Dr. Elberta Fortis nurse regarding her chest pain   Please call

## 2019-11-03 ENCOUNTER — Encounter (HOSPITAL_COMMUNITY): Payer: Self-pay

## 2019-11-03 ENCOUNTER — Emergency Department (HOSPITAL_COMMUNITY)
Admission: EM | Admit: 2019-11-03 | Discharge: 2019-11-03 | Disposition: A | Discharge status: Home / Self Care | Payer: Medicare PPO | Attending: Emergency Medicine | Admitting: Emergency Medicine

## 2019-11-03 ENCOUNTER — Other Ambulatory Visit: Payer: Self-pay

## 2019-11-03 ENCOUNTER — Emergency Department (HOSPITAL_COMMUNITY): Payer: Medicare PPO

## 2019-11-03 DIAGNOSIS — I2510 Atherosclerotic heart disease of native coronary artery without angina pectoris: Secondary | ICD-10-CM | POA: Insufficient documentation

## 2019-11-03 DIAGNOSIS — Z7901 Long term (current) use of anticoagulants: Secondary | ICD-10-CM | POA: Insufficient documentation

## 2019-11-03 DIAGNOSIS — R002 Palpitations: Secondary | ICD-10-CM | POA: Insufficient documentation

## 2019-11-03 DIAGNOSIS — Z79899 Other long term (current) drug therapy: Secondary | ICD-10-CM | POA: Diagnosis not present

## 2019-11-03 DIAGNOSIS — R0789 Other chest pain: Secondary | ICD-10-CM | POA: Diagnosis not present

## 2019-11-03 DIAGNOSIS — R1013 Epigastric pain: Secondary | ICD-10-CM | POA: Insufficient documentation

## 2019-11-03 DIAGNOSIS — I10 Essential (primary) hypertension: Secondary | ICD-10-CM | POA: Diagnosis not present

## 2019-11-03 LAB — CBC WITH DIFFERENTIAL/PLATELET
Abs Immature Granulocytes: 0.02 10*3/uL (ref 0.00–0.07)
Basophils Absolute: 0 10*3/uL (ref 0.0–0.1)
Basophils Relative: 1 %
Eosinophils Absolute: 0.1 10*3/uL (ref 0.0–0.5)
Eosinophils Relative: 2 %
HCT: 44.5 % (ref 36.0–46.0)
Hemoglobin: 14.3 g/dL (ref 12.0–15.0)
Immature Granulocytes: 0 %
Lymphocytes Relative: 48 %
Lymphs Abs: 2.9 10*3/uL (ref 0.7–4.0)
MCH: 32.8 pg (ref 26.0–34.0)
MCHC: 32.1 g/dL (ref 30.0–36.0)
MCV: 102.1 fL — ABNORMAL HIGH (ref 80.0–100.0)
Monocytes Absolute: 0.4 10*3/uL (ref 0.1–1.0)
Monocytes Relative: 7 %
Neutro Abs: 2.5 10*3/uL (ref 1.7–7.7)
Neutrophils Relative %: 42 %
Platelets: 273 10*3/uL (ref 150–400)
RBC: 4.36 MIL/uL (ref 3.87–5.11)
RDW: 13.9 % (ref 11.5–15.5)
WBC: 5.9 10*3/uL (ref 4.0–10.5)
nRBC: 0 % (ref 0.0–0.2)

## 2019-11-03 LAB — COMPREHENSIVE METABOLIC PANEL
ALT: 18 U/L (ref 0–44)
AST: 25 U/L (ref 15–41)
Albumin: 4.2 g/dL (ref 3.5–5.0)
Alkaline Phosphatase: 81 U/L (ref 38–126)
Anion gap: 8 (ref 5–15)
BUN: 19 mg/dL (ref 8–23)
CO2: 24 mmol/L (ref 22–32)
Calcium: 9.5 mg/dL (ref 8.9–10.3)
Chloride: 104 mmol/L (ref 98–111)
Creatinine, Ser: 1.53 mg/dL — ABNORMAL HIGH (ref 0.44–1.00)
GFR calc Af Amer: 37 mL/min — ABNORMAL LOW (ref >60)
GFR calc non Af Amer: 32 mL/min — ABNORMAL LOW (ref >60)
Glucose, Bld: 104 mg/dL — ABNORMAL HIGH (ref 70–99)
Potassium: 3.6 mmol/L (ref 3.5–5.1)
Sodium: 136 mmol/L (ref 135–145)
Total Bilirubin: 0.3 mg/dL (ref 0.3–1.2)
Total Protein: 7.1 g/dL (ref 6.5–8.1)

## 2019-11-03 LAB — URINALYSIS, ROUTINE W REFLEX MICROSCOPIC
Bacteria, UA: NONE SEEN
Bilirubin Urine: NEGATIVE
Glucose, UA: NEGATIVE mg/dL
Ketones, ur: NEGATIVE mg/dL
Nitrite: NEGATIVE
Protein, ur: NEGATIVE mg/dL
Specific Gravity, Urine: 1.005 (ref 1.005–1.030)
pH: 5 (ref 5.0–8.0)

## 2019-11-03 LAB — LIPASE, BLOOD: Lipase: 28 U/L (ref 11–51)

## 2019-11-03 LAB — TROPONIN I (HIGH SENSITIVITY)
Troponin I (High Sensitivity): 8 ng/L (ref <18)
Troponin I (High Sensitivity): 9 ng/L (ref <18)

## 2019-11-03 MED ORDER — ALUM & MAG HYDROXIDE-SIMETH 200-200-20 MG/5ML PO SUSP
30.0000 mL | Freq: Once | ORAL | Status: AC
  Administered 2019-11-03: 03:00:00 30 mL via ORAL
  Filled 2019-11-03: qty 30

## 2019-11-03 MED ORDER — SUCRALFATE 1 G PO TABS: 1.0000 g | ORAL_TABLET | Freq: Three times a day (TID) | ORAL | 0 refills | Status: DC

## 2019-11-03 MED ORDER — LIDOCAINE VISCOUS HCL 2 % MT SOLN
15.0000 mL | Freq: Once | OROMUCOSAL | Status: AC
  Administered 2019-11-03: 15 mL via ORAL
  Filled 2019-11-03: qty 15

## 2019-11-03 NOTE — ED Notes (Signed)
Pt ambulated in hallway and back. O2 sats 97-100% on RA.

## 2019-11-03 NOTE — ED Triage Notes (Signed)
Pt reports chest pain that has been intermittent for 1-1/2 weeks. Pt says it feels like heartburn. Pt says she has been taking omeprazole and Tums- pt says this has helped, but when it wears off, the pain comes back. Pt says she took an omeprazole about noon and Tums with supper yesterday evening.

## 2019-11-03 NOTE — ED Provider Notes (Signed)
[facility] EMERGENCY DEPARTMENT Provider Note   CSN: [id number] Arrival date & time: 11/03/19  0129     History Chief Complaint  Patient presents with  . Chest Pain    epigastric     Hailey [name] is a 79 y.o. female.  Patient with history of atrial fibrillation on Eliquis, hypertension, palpitations, acid reflux disease hiatal hernia here with 10-day history of intermittent epigastric pain and "chest pain".  She describes pain in the center of her abdomen that radiates up into her chest.  She describes the pain as a burning and pinching.  This pain comes and goes lasting for several hours at a time.  There is no radiation of the pain to her arms, back or neck.  No associated shortness of breath.  No nausea or vomiting.  No diarrhea.  No pain with urination or blood in the urine.  She has been taking Prilosec at home which she takes on a regular basis as well as Tums which have been helping partially.  She called her cardiologist who told her it was likely not cardiac and to follow-up with your primary doctor who is out of town this week.  Reports she had an EGD several years ago that showed no ulcers.  She denies any black or bloody stools.  She is had a previous cholecystectomy.  Additionally she reports she is had palpitations intermittently for the past several days and she became concerned tonight because she had "pounding in my head" and her pulse at home was 97.  She denies any headache or visual change.  No focal weakness, numbness or tingling.  The history is provided by the patient.       Past Medical History:  Diagnosis Date  . Atrial fibrillation (HCC)   . Breast nodule 11/16/2013  . H/O hematuria   . HLD (hyperlipidemia)   . Hypertension   . 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 Migraine headache   . Palpitations   . Restless leg syndrome   . Vaginal atrophy     Patient Active Problem List   Diagnosis Date Noted  . Acquired thrombophilia (HCC) 06/22/2019   . Persistent atrial fibrillation (HCC) 05/11/2019  . Atrial flutter (HCC)   . Atrial fibrillation with RVR (HCC) 05/01/2019  . Essential hypertension   . Atrial fibrillation (HCC) 07/14/2018  . Vaginal dryness 01/28/2017  . Vaginal atrophy 01/28/2017  . Breast nodule 11/16/2013  . Breast mass, right 12/22/2012  . OVERWEIGHT 04/13/2009  . GENERALIZED ANXIETY DISORDER 04/13/2009  . Coronary artery disease involving native heart without angina pectoris 04/13/2009  . PALPITATIONS 04/03/2009    Past Surgical History:  Procedure Laterality Date  . APPENDECTOMY    . ATRIAL FIBRILLATION ABLATION N/A 08/20/2018   Procedure: ATRIAL FIBRILLATION ABLATION;  Surgeon: Regan Lemming, MD;  Location: [facility] INVASIVE CV LAB;  Service: Cardiovascular;  Laterality: N/A;  . ATRIAL FIBRILLATION ABLATION N/A 05/11/2019   Procedure: ATRIAL FIBRILLATION ABLATION;  Surgeon: Regan Lemming, MD;  Location: [facility] INVASIVE CV LAB;  Service: Cardiovascular;  Laterality: N/A;  . CARDIAC CATHETERIZATION  2005    cone  . CARDIOVERSION N/A 05/02/2019   Procedure: CARDIOVERSION;  Surgeon: Jodelle Red, MD;  Location: [facility] ENDOSCOPY;  Service: Cardiovascular;  Laterality: N/A;  . COLONOSCOPY    . HERNIA REPAIR    . LEFT HEART CATHETERIZATION WITH CORONARY ANGIOGRAM N/A 04/12/2012   Procedure: LEFT HEART CATHETERIZATION WITH CORONARY ANGIOGRAM;  Surgeon: Kathleene Hazel, MD;  Location: [facility] CATH LAB;  Service: Cardiovascular;  Laterality: N/A;  . TOTAL ABDOMINAL HYSTERECTOMY       OB History    Gravida  1   Para      Term      Preterm      AB  1   Living  0     SAB  1   TAB      Ectopic      Multiple      Live Births              Family History  Problem Relation Age of Onset  . Coronary artery disease Father        mid 41s  . Hypertension Mother   . Stroke Brother   . Transient ischemic attack Brother   . Other Brother        on oxygen  . Hypertension Brother   .  Stroke Brother   . Heart disease Brother   . Heart attack Brother   . Other Sister        a fib, restless leg  . Other Sister        pressure in eyes    Social History   Tobacco Use  . Smoking status: Never Smoker  . Smokeless tobacco: Never Used  Substance Use Topics  . Alcohol use: Yes    Comment: rarely; glass of wine with dinner  . Drug use: No    Home Medications Prior to Admission medications   Medication Sig Start Date End Date Taking? Authorizing Provider  acetaminophen (TYLENOL) 650 MG CR tablet Take 650 mg by mouth 2 (two) times daily.     [provider]  apixaban (ELIQUIS) 5 MG TABS tablet Take 5 mg by mouth 2 (two) times daily.    [provider]  Calcium Carb-Cholecalciferol (CALCIUM 600+D3 PO) Take 1 tablet by mouth daily with lunch.     [provider]  diltiazem (CARDIZEM CD) 180 MG 24 hr capsule Take 1 capsule (180 mg total) by mouth daily. 05/23/19   Camnitz, Andree Coss, MD  flecainide (TAMBOCOR) 50 MG tablet Take 1 tablet (50 mg total) by mouth 2 (two) times daily. 10/18/19   Camnitz, Will Daphine Deutscher, MD  fluticasone (FLONASE) 50 MCG/ACT nasal spray Place 2 sprays into both nostrils daily as needed for allergies or rhinitis.    [provider]  gabapentin (NEURONTIN) 100 MG capsule Take 100 mg by mouth at bedtime.    [provider]  hydrocortisone (ANUSOL-HC) 2.5 % rectal cream 2 (two) times daily.  06/20/19   [provider]  loratadine (CLARITIN) 10 MG tablet Take 10 mg by mouth daily as needed (seasonal allergies).     [provider]  meclizine (ANTIVERT) 25 MG tablet Take 25 mg by mouth 2 (two) times daily as needed for dizziness.     [provider]  metroNIDAZOLE (METROCREAM) 0.75 % cream Apply 1 application topically 2 (two) times daily as needed (rosacea).  04/27/19   [provider]  Multiple Vitamin (MULTIVITAMIN WITH MINERALS) TABS tablet Take 1 tablet by mouth daily with  lunch. Centrum Silver for Women     [provider]  omeprazole (PRILOSEC) 20 MG capsule Take 20 mg by mouth daily. 06/06/19   [provider]  ondansetron (ZOFRAN) 4 MG tablet Take 1 tablet (4 mg total) by mouth every 6 (six) hours as needed. 04/24/18   Dione Booze, MD    Allergies  Amiodarone and Flomax [tamsulosin hcl]  Review of Systems   Review of Systems  Constitutional: Negative for activity change, appetite change and fever.  HENT: Negative for congestion.   Respiratory: Positive for chest tightness. Negative for shortness of breath.   Cardiovascular: Positive for chest pain and palpitations.  Gastrointestinal: Positive for abdominal pain. Negative for nausea.  Genitourinary: Negative for dysuria and hematuria.  Musculoskeletal: Negative for arthralgias, back pain and myalgias.  Skin: Negative for rash.  Neurological: Negative for dizziness, weakness and headaches.    all other systems are negative except as noted in the HPI and PMH.   Physical Exam Updated Vital Signs BP (!) 157/81 (BP Location: Left Arm)   Pulse 76   Temp 98.4 F (36.9 C) (Oral)   Resp 18   Ht 5\' 7"  (1.702 m)   Wt 88 kg   SpO2 100%   BMI 30.38 kg/m   Physical Exam Vitals and nursing note reviewed.  Constitutional:      General: She is not in acute distress.    Appearance: Normal appearance. She is well-developed and normal weight. She is not ill-appearing.  HENT:     Head: Normocephalic and atraumatic.     Mouth/Throat:     Pharynx: No oropharyngeal exudate.  Eyes:     Conjunctiva/sclera: Conjunctivae normal.     Pupils: Pupils are equal, round, and reactive to light.  Neck:     Comments: No meningismus. Cardiovascular:     Rate and Rhythm: Normal rate and regular rhythm.     Heart sounds: Normal heart sounds. No murmur.  Pulmonary:     Effort: Pulmonary effort is normal. No respiratory distress.     Breath sounds: Normal breath sounds.  Chest:     Chest wall: No  tenderness.  Abdominal:     Palpations: Abdomen is soft.     Tenderness: There is abdominal tenderness. There is no guarding or rebound.     Comments: Epigastric tenderness, no guarding or rebound  Musculoskeletal:        General: No tenderness. Normal range of motion.     Cervical back: Normal range of motion and neck supple.  Skin:    General: Skin is warm.     Capillary Refill: Capillary refill takes less than 2 seconds.  Neurological:     General: No focal deficit present.     Mental Status: She is alert and oriented to person, place, and time. Mental status is at baseline.     Cranial Nerves: No cranial nerve deficit.     Motor: No abnormal muscle tone.     Coordination: Coordination normal.     Comments: No ataxia on finger to nose bilaterally. No pronator drift. 5/5 strength throughout. CN 2-12 intact.Equal grip strength. Sensation intact.   Psychiatric:        Behavior: Behavior normal.     ED Results / Procedures / Treatments   Labs (all labs ordered are listed, but only abnormal results are displayed) Labs Reviewed  CBC WITH DIFFERENTIAL/PLATELET - Abnormal; Notable for the following components:      Result Value   MCV 102.1 (*)    All other components within normal limits  COMPREHENSIVE METABOLIC PANEL - Abnormal; Notable for the following components:   Glucose, Bld 104 (*)    Creatinine, Ser 1.53 (*)    GFR calc non Af Amer 32 (*)    GFR calc Af Amer 37 (*)    All other components within normal limits  URINALYSIS, ROUTINE W REFLEX MICROSCOPIC - Abnormal; Notable for the following components:   Color, Urine STRAW (*)    Hgb urine dipstick SMALL (*)    Leukocytes,Ua SMALL (*)    All other components within normal limits  LIPASE, BLOOD  TROPONIN I (HIGH SENSITIVITY)  TROPONIN I (HIGH SENSITIVITY)    EKG EKG Interpretation  Date/Time:  Thursday November 03 2019 01:41:51 EDT Ventricular Rate:  78 PR Interval:    QRS Duration: 138 QT Interval:  422 QTC  Calculation: 481 R Axis:   85 Text Interpretation: Sinus rhythm Prolonged PR interval Right bundle branch block Probable anteroseptal infarct, old No significant change was found Confirmed by Glynn Octave [phone]) on 11/03/2019 2:07:42 AM   Radiology DG Abdomen Acute W/Chest  Result Date: 11/03/2019 CLINICAL DATA:  Epigastric pain EXAM: DG ABDOMEN ACUTE W/ 1V CHEST COMPARISON:  None. FINDINGS: There is no evidence of dilated bowel loops or free intraperitoneal air. No radiopaque calculi or other significant radiographic abnormality is seen. Heart size and mediastinal contours are within normal limits. Both lungs are clear. IMPRESSION: Negative abdominal radiographs.  No acute cardiopulmonary disease. Electronically Signed   By: Deatra Robinson M.D.   On: 11/03/2019 03:24    Procedures Procedures (including critical care time)  Medications Ordered in ED Medications - No data to display  ED Course  I have reviewed the triage vital signs and the nursing notes.  Pertinent labs & imaging results that were available during my care of the patient were reviewed by me and considered in my medical decision making (see chart for details).    MDM Rules/Calculators/A&P                      10-day history of upper abdominal pain partially relieved with Prilosec and Tums.  EKG shows no acute ischemia.  She is in sinus rhythm.  Previous cholecystectomy.  Labs are reassuring.  Troponin is negative, LFTs and lipase are normal.  Chest x-ray acute abdominal series are negative.  Equal upper extremity blood pressures.  Low suspicion for aortic dissection or PE.  She is on Eliquis.  She is given a GI cocktail with relief. Troponin negative x2. Low suspicion for ACS.  Advised to continue her Prilosec, will add Carafate.  Avoid alcohol, caffeine, NSAIDs, spicy foods.  Follow-up with her PCP as well as GI for possible EGD.  Return to the ED if you develop exertional chest pain, shortness of breath,  nausea, vomiting, diaphoresis, or other concerns. Final Clinical Impression(s) / ED Diagnoses Final diagnoses:  Epigastric pain    Rx / DC Orders ED Discharge Orders    None       Horatio Bertz, Jeannett Senior, MD 11/03/19 [phone]

## 2019-11-03 NOTE — Discharge Instructions (Signed)
Your testing shows no evidence of heart attack.  Continue your omeprazole and you had added Carafate as we discussed.  Avoid alcohol, caffeine, NSAIDs and spicy foods. Follow-up with your primary doctor.  You should also see the gastroenterologist because you may need to have a EGD to evaluate for ulcers. Return to the ED if your chest pain becomes exertional, associated with shortness of breath, nausea, vomiting, diaphoresis, any other concerns

## 2019-11-08 DIAGNOSIS — Z6829 Body mass index (BMI) 29.0-29.9, adult: Secondary | ICD-10-CM | POA: Diagnosis not present

## 2019-11-08 DIAGNOSIS — R109 Unspecified abdominal pain: Secondary | ICD-10-CM | POA: Diagnosis not present

## 2019-11-08 DIAGNOSIS — K2100 Gastro-esophageal reflux disease with esophagitis, without bleeding: Secondary | ICD-10-CM | POA: Diagnosis not present

## 2019-11-16 ENCOUNTER — Encounter: Payer: Self-pay | Admitting: Internal Medicine

## 2019-11-29 ENCOUNTER — Other Ambulatory Visit: Payer: Self-pay

## 2019-11-29 ENCOUNTER — Ambulatory Visit: Payer: Medicare PPO | Admitting: Nurse Practitioner

## 2019-11-29 ENCOUNTER — Encounter: Payer: Self-pay | Admitting: Nurse Practitioner

## 2019-11-29 DIAGNOSIS — R1013 Epigastric pain: Secondary | ICD-10-CM | POA: Diagnosis not present

## 2019-11-29 DIAGNOSIS — R109 Unspecified abdominal pain: Secondary | ICD-10-CM | POA: Insufficient documentation

## 2019-11-29 DIAGNOSIS — K219 Gastro-esophageal reflux disease without esophagitis: Secondary | ICD-10-CM | POA: Diagnosis not present

## 2019-11-29 MED ORDER — SUCRALFATE 1 G PO TABS: 1.0000 g | ORAL_TABLET | Freq: Four times a day (QID) | ORAL | 0 refills | Status: DC | PRN

## 2019-11-29 NOTE — Progress Notes (Signed)
Primary Care Physician:  Richardean Chimera, MD Primary Gastroenterologist:  Dr. Jena Gauss  Chief Complaint  Patient presents with  . Gastroesophageal Reflux    doing okay right now. few weeks ago had hot feeling in chest up into throat. Went to ED 4/1 and was told she was having epigastric pain and rx'd carafate    HPI:   Hailey [name] is a 79 y.o. female who presents on referral from primary care for GERD and reflux.  Reviewed information provided with referral including primary care office visit note dated 11/08/2019 when she presented with chest pain down to the top of her stomach and in the epigastric area.  She started having bad epigastric burning 2 weeks prior, omeprazole may have helped a little.  Went to the ER and EKG was normal.  GI cocktail helped and was given Carafate which helped a lot.  Symptoms felt to be improving.  Recommended omeprazole 20 mg twice daily.  Recommended take Carafate for 1 whole month, 4 times a day.  Call for any recurrent or worsening symptoms.  It does appear she was previously seen by Dr. Ewing Schlein in 2005.  There was an upper endoscopy dated 04/03/1994 and small hiatal hernia with minimal distal esophagitis, mild diffuse gastritis, slight inflammation of the pyloric channel, mild duodenitis.  Felt likely due to hyperacidity and was recommended to start Prilosec.  Today she states she's doing ok overall. She has a history of GERD but no significant symptoms in years. No known triggers. In the evening had sudden burning, so she took omeprazole which helped somewhat; symptoms improved but never stopped. Had bloating as well. Has a history of hiatal hernia. She is currently doing much better on bid omeprazole. Rare/occasional TUMS. Has chronic nausea from ADE to AFib medication. Bloating has improved as well. Denies significant abdominal pain, vomiting, hematochezia, melena, fever, chills, unintentional weight loss. Denies URI or flu-like symptoms. Denies loss of sense of  taste or smell. She has had both COVID-19 vaccines. Denies chest pain, dyspnea, dizziness, lightheadedness, syncope, near syncope. Denies any other upper or lower GI symptoms.  Past Medical History:  Diagnosis Date  . Atrial fibrillation (HCC)   . Breast nodule 11/16/2013  . H/O hematuria   . HLD (hyperlipidemia)   . Hypertension   . 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 Migraine headache   . Palpitations   . Restless leg syndrome   . Vaginal atrophy     Past Surgical History:  Procedure Laterality Date  . APPENDECTOMY    . ATRIAL FIBRILLATION ABLATION N/A 08/20/2018   Procedure: ATRIAL FIBRILLATION ABLATION;  Surgeon: Regan Lemming, MD;  Location: [facility] INVASIVE CV LAB;  Service: Cardiovascular;  Laterality: N/A;  . ATRIAL FIBRILLATION ABLATION N/A 05/11/2019   Procedure: ATRIAL FIBRILLATION ABLATION;  Surgeon: Regan Lemming, MD;  Location: [facility] INVASIVE CV LAB;  Service: Cardiovascular;  Laterality: N/A;  . CARDIAC CATHETERIZATION  2005    cone  . CARDIOVERSION N/A 05/02/2019   Procedure: CARDIOVERSION;  Surgeon: Jodelle Red, MD;  Location: [facility] ENDOSCOPY;  Service: Cardiovascular;  Laterality: N/A;  . COLONOSCOPY    . HERNIA REPAIR    . LEFT HEART CATHETERIZATION WITH CORONARY ANGIOGRAM N/A 04/12/2012   Procedure: LEFT HEART CATHETERIZATION WITH CORONARY ANGIOGRAM;  Surgeon: Kathleene Hazel, MD;  Location: [facility] CATH LAB;  Service: Cardiovascular;  Laterality: N/A;  . TOTAL ABDOMINAL HYSTERECTOMY      Current Outpatient Medications  Medication Sig Dispense  Refill  . acetaminophen (TYLENOL) 650 MG CR tablet Take 650 mg by mouth 2 (two) times daily.     Marland Kitchen apixaban (ELIQUIS) 5 MG TABS tablet Take 5 mg by mouth 2 (two) times daily.    . Calcium Carb-Cholecalciferol (CALCIUM 600+D3 PO) Take 1 tablet by mouth daily.     Marland Kitchen diltiazem (CARDIZEM CD) 180 MG 24 hr capsule Take 1 capsule (180 mg total) by mouth daily. 30 capsule 8  . flecainide (TAMBOCOR) 50 MG  tablet Take 1 tablet (50 mg total) by mouth 2 (two) times daily. 60 tablet 8  . gabapentin (NEURONTIN) 100 MG capsule Take 100 mg by mouth at bedtime.    . Multiple Vitamin (MULTIVITAMIN WITH MINERALS) TABS tablet Take 1 tablet by mouth daily. Centrum Silver for Women     . omeprazole (PRILOSEC) 20 MG capsule Take 20 mg by mouth 2 (two) times daily before a meal.     . fluticasone (FLONASE) 50 MCG/ACT nasal spray Place 2 sprays into both nostrils daily as needed for allergies or rhinitis.    Marland Kitchen metroNIDAZOLE (METROCREAM) 0.75 % cream Apply 1 application topically 2 (two) times daily as needed (rosacea).     . sucralfate (CARAFATE) 1 g tablet Take 1 tablet (1 g total) by mouth 4 (four) times daily as needed. 30 tablet 0   No current facility-administered medications for this visit.    Allergies as of 11/29/2019 - Review Complete 11/29/2019  Allergen Reaction Noted  . Amiodarone Itching 04/24/2018  . Flomax [tamsulosin hcl] Nausea And Vomiting 01/24/2015    Family History  Problem Relation Age of Onset  . Coronary artery disease Father        mid 87s  . Hypertension Mother   . Stroke Brother   . Transient ischemic attack Brother   . Other Brother        on oxygen  . Hypertension Brother   . Stroke Brother   . Heart disease Brother   . Heart attack Brother   . Other Sister        a fib, restless leg  . Other Sister        pressure in eyes    Social History   Socioeconomic History  . Marital status: Single    Spouse name: Not on file  . Number of children: 0  . Years of education: College  . Highest education level: Not on file  Occupational History  . Occupation: retired  Tobacco Use  . Smoking status: Never Smoker  . Smokeless tobacco: Never Used  Substance and Sexual Activity  . Alcohol use: Yes    Comment: rarely; glass of wine with dinner  . Drug use: No  . Sexual activity: Never    Birth control/protection: Surgical    Comment: hyst  Other Topics Concern  .  Not on file  Social History Narrative   Single. Retired.    Patient lives at home alone.   Caffeine Use: none   Social Determinants of Health   Financial Resource Strain:   . Difficulty of Paying Living Expenses:   Food Insecurity:   . Worried About Programme researcher, broadcasting/film/video in the Last Year:   . Barista in the Last Year:   Transportation Needs:   . Freight forwarder (Medical):   Marland Kitchen Lack of Transportation (Non-Medical):   Physical Activity:   . Days of Exercise per Week:   . Minutes of Exercise per Session:   Stress:   .  Feeling of Stress :   Social Connections:   . Frequency of Communication with Friends and Family:   . Frequency of Social Gatherings with Friends and Family:   . Attends Religious Services:   . Active Member of Clubs or Organizations:   . Attends Archivist Meetings:   Marland Kitchen Marital Status:   Intimate Partner Violence:   . Fear of Current or Ex-Partner:   . Emotionally Abused:   Marland Kitchen Physically Abused:   . Sexually Abused:     Subjective: Review of Systems  Constitutional: Negative for chills, fever, malaise/fatigue and weight loss.  HENT: Negative for congestion and sore throat.   Respiratory: Negative for cough and shortness of breath.   Cardiovascular: Negative for chest pain and palpitations.  Gastrointestinal: Positive for heartburn (significantly improved, essentially resolved for now). Negative for abdominal pain, blood in stool, diarrhea, melena, nausea and vomiting.  Musculoskeletal: Negative for joint pain and myalgias.  Skin: Negative for rash.  Neurological: Negative for dizziness and weakness.  Endo/Heme/Allergies: Does not bruise/bleed easily.  Psychiatric/Behavioral: Negative for depression. The patient is not nervous/anxious.   All other systems reviewed and are negative.      Objective: BP 131/75   Pulse 64   Temp (!) 97.1 F (36.2 C) (Oral)   Ht 5\' 7"  (1.702 m)   Wt 198 lb (89.8 kg)   BMI 31.01 kg/m  Physical  Exam Vitals and nursing note reviewed.  Constitutional:      General: She is not in acute distress.    Appearance: Normal appearance. She is well-developed. She is not ill-appearing, toxic-appearing or diaphoretic.  HENT:     Head: Normocephalic and atraumatic.     Nose: No congestion or rhinorrhea.  Eyes:     General: No scleral icterus. Cardiovascular:     Rate and Rhythm: Normal rate and regular rhythm.     Heart sounds: Normal heart sounds.  Pulmonary:     Effort: Pulmonary effort is normal. No respiratory distress.     Breath sounds: Normal breath sounds.  Abdominal:     General: Bowel sounds are normal.     Palpations: Abdomen is soft. There is no hepatomegaly, splenomegaly or mass.     Tenderness: There is no abdominal tenderness. There is no guarding or rebound.     Hernia: No hernia is present.  Skin:    General: Skin is warm and dry.     Coloration: Skin is not jaundiced.     Findings: No rash.  Neurological:     General: No focal deficit present.     Mental Status: She is alert and oriented to person, place, and time.  Psychiatric:        Attention and Perception: Attention normal.        Mood and Affect: Mood normal.        Speech: Speech normal.        Behavior: Behavior normal.        Thought Content: Thought content normal.        Cognition and Memory: Cognition and memory normal.       11/29/2019 3:54 PM   Disclaimer: This note was dictated with voice recognition software. Similar sounding words can inadvertently be transcribed and may not be corrected upon review.

## 2019-11-29 NOTE — Assessment & Plan Note (Signed)
In addition to epigastric burning she also had esophageal burning consistent with GERD/reflux type symptoms.  She has been doing better on omeprazole twice daily and Carafate as needed.  She is out of Carafate and have sent a refill.  Recommend she call us for any worsening or severe symptoms.  Follow-up in 3 months.  If she continues to be well we can back off on her follow-up to 6 to 12 months.

## 2019-11-29 NOTE — Assessment & Plan Note (Signed)
The patient was having significant epigastric burning which is now improved since being started on omeprazole twice daily and Carafate.  She no longer has Carafate as she was only given 2 weeks of it from the emergency room.  At this point I recommended she continue twice daily omeprazole, I have sent a prescription for Carafate to keep on hand should she have middle of the night flare of symptoms again.  Follow-up in 3 months.  Call for any worsening or severe symptoms.

## 2019-11-29 NOTE — Patient Instructions (Addendum)
Your health issues we discussed today were:   Epigastric (upper abdomen) pain and GERD (reflux/heartburn): 1. Continue taking Prilosec 20 mg twice daily 2. I sent a prescription for Carafate to your pharmacy that you can use up to 4 times a day as needed for "breakthrough" symptoms 3. Call us if you have any worsening or severe symptoms  Overall I recommend:  1. Continue your other current medications 2. Return for follow-up in 3 months 3. Call us if any questions or concerns   ---------------------------------------------------------------  I am glad you have gotten your COVID-19 vaccination!  Even though you are fully vaccinated you should continue to wear a mask, socially distance, and wash your hands frequently.  ---------------------------------------------------------------   At [facility] we value your feedback. You may receive a survey about your visit today. Please share your experience as we strive to create trusting relationships with our patients to provide genuine, compassionate, quality care.  We appreciate your understanding and patience as we review any laboratory studies, imaging, and other diagnostic tests that are ordered as we care for you. Our office policy is 5 business days for review of these results, and any emergent or urgent results are addressed in a timely manner for your best interest. If you do not hear from our office in 1 week, please contact us.   We also encourage the use of MyChart, which contains your medical information for your review as well. If you are not enrolled in this feature, an access code is on this after visit summary for your convenience. Thank you for allowing [place] to be involved in your care.  It was great to see you today!  I hope you have a great Summer!!

## 2019-11-30 ENCOUNTER — Encounter: Payer: Self-pay | Admitting: Nurse Practitioner

## 2019-12-12 DIAGNOSIS — H9209 Otalgia, unspecified ear: Secondary | ICD-10-CM | POA: Diagnosis not present

## 2019-12-12 DIAGNOSIS — H6123 Impacted cerumen, bilateral: Secondary | ICD-10-CM | POA: Diagnosis not present

## 2019-12-12 DIAGNOSIS — Z6829 Body mass index (BMI) 29.0-29.9, adult: Secondary | ICD-10-CM | POA: Diagnosis not present

## 2019-12-23 ENCOUNTER — Other Ambulatory Visit (HOSPITAL_COMMUNITY): Payer: Self-pay | Admitting: Adult Health

## 2019-12-23 DIAGNOSIS — Z1231 Encounter for screening mammogram for malignant neoplasm of breast: Secondary | ICD-10-CM

## 2020-01-04 ENCOUNTER — Other Ambulatory Visit: Payer: Self-pay

## 2020-01-04 ENCOUNTER — Emergency Department (HOSPITAL_COMMUNITY): Payer: Medicare PPO

## 2020-01-04 ENCOUNTER — Other Ambulatory Visit (HOSPITAL_COMMUNITY): Payer: Medicare PPO

## 2020-01-04 ENCOUNTER — Encounter (HOSPITAL_COMMUNITY): Payer: Self-pay | Admitting: *Deleted

## 2020-01-04 ENCOUNTER — Other Ambulatory Visit (HOSPITAL_COMMUNITY): Payer: Self-pay | Admitting: Radiology

## 2020-01-04 ENCOUNTER — Emergency Department (HOSPITAL_COMMUNITY)
Admission: EM | Admit: 2020-01-04 | Discharge: 2020-01-04 | Disposition: A | Discharge status: Home / Self Care | Payer: Medicare PPO | Attending: Emergency Medicine | Admitting: Emergency Medicine

## 2020-01-04 DIAGNOSIS — Y999 Unspecified external cause status: Secondary | ICD-10-CM | POA: Insufficient documentation

## 2020-01-04 DIAGNOSIS — S80812A Abrasion, left lower leg, initial encounter: Secondary | ICD-10-CM | POA: Insufficient documentation

## 2020-01-04 DIAGNOSIS — S99921A Unspecified injury of right foot, initial encounter: Secondary | ICD-10-CM | POA: Diagnosis not present

## 2020-01-04 DIAGNOSIS — W010XXA Fall on same level from slipping, tripping and stumbling without subsequent striking against object, initial encounter: Secondary | ICD-10-CM | POA: Diagnosis not present

## 2020-01-04 DIAGNOSIS — S4992XA Unspecified injury of left shoulder and upper arm, initial encounter: Secondary | ICD-10-CM | POA: Diagnosis not present

## 2020-01-04 DIAGNOSIS — M79662 Pain in left lower leg: Secondary | ICD-10-CM | POA: Diagnosis not present

## 2020-01-04 DIAGNOSIS — S60511A Abrasion of right hand, initial encounter: Secondary | ICD-10-CM | POA: Insufficient documentation

## 2020-01-04 DIAGNOSIS — Y9389 Activity, other specified: Secondary | ICD-10-CM | POA: Insufficient documentation

## 2020-01-04 DIAGNOSIS — S90119A Contusion of unspecified great toe without damage to nail, initial encounter: Secondary | ICD-10-CM | POA: Insufficient documentation

## 2020-01-04 DIAGNOSIS — Y929 Unspecified place or not applicable: Secondary | ICD-10-CM | POA: Diagnosis not present

## 2020-01-04 DIAGNOSIS — S8992XA Unspecified injury of left lower leg, initial encounter: Secondary | ICD-10-CM | POA: Diagnosis not present

## 2020-01-04 DIAGNOSIS — S9031XA Contusion of right foot, initial encounter: Secondary | ICD-10-CM | POA: Diagnosis not present

## 2020-01-04 DIAGNOSIS — M79672 Pain in left foot: Secondary | ICD-10-CM | POA: Diagnosis not present

## 2020-01-04 DIAGNOSIS — M79671 Pain in right foot: Secondary | ICD-10-CM | POA: Diagnosis not present

## 2020-01-04 DIAGNOSIS — S0990XA Unspecified injury of head, initial encounter: Secondary | ICD-10-CM | POA: Diagnosis not present

## 2020-01-04 DIAGNOSIS — T07XXXA Unspecified multiple injuries, initial encounter: Secondary | ICD-10-CM

## 2020-01-04 DIAGNOSIS — I10 Essential (primary) hypertension: Secondary | ICD-10-CM | POA: Diagnosis not present

## 2020-01-04 DIAGNOSIS — W19XXXA Unspecified fall, initial encounter: Secondary | ICD-10-CM

## 2020-01-04 DIAGNOSIS — S199XXA Unspecified injury of neck, initial encounter: Secondary | ICD-10-CM | POA: Diagnosis not present

## 2020-01-04 DIAGNOSIS — M25512 Pain in left shoulder: Secondary | ICD-10-CM | POA: Insufficient documentation

## 2020-01-04 DIAGNOSIS — Z79899 Other long term (current) drug therapy: Secondary | ICD-10-CM | POA: Diagnosis not present

## 2020-01-04 DIAGNOSIS — Z23 Encounter for immunization: Secondary | ICD-10-CM | POA: Diagnosis not present

## 2020-01-04 DIAGNOSIS — S99922A Unspecified injury of left foot, initial encounter: Secondary | ICD-10-CM | POA: Diagnosis not present

## 2020-01-04 MED ORDER — TETANUS-DIPHTH-ACELL PERTUSSIS 5-2.5-18.5 LF-MCG/0.5 IM SUSP
0.5000 mL | Freq: Once | INTRAMUSCULAR | Status: DC
  Filled 2020-01-04: qty 0.5

## 2020-01-04 NOTE — ED Provider Notes (Signed)
[facility] EMERGENCY DEPARTMENT Provider Note   CSN: [id number] Arrival date & time: 01/04/20  1300     History Chief Complaint  Patient presents with  . Fall    Hailey [name] is a 79 y.o. female.  HPI Patient reports she fell while taking her trash out earlier today. Did not pass out. She injured toes on both feet, L shin, L shoulder and she hit her head. No LOC. She is complaining of mild aching pain to the site of her injuries. She is on Eliquis.     Past Medical History:  Diagnosis Date  . Atrial fibrillation (HCC)   . Breast nodule 11/16/2013  . H/O hematuria   . HLD (hyperlipidemia)   . Hypertension   . 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 Migraine headache   . Palpitations   . Restless leg syndrome   . Vaginal atrophy     Patient Active Problem List   Diagnosis Date Noted  . GERD (gastroesophageal reflux disease) 11/29/2019  . Abdominal pain 11/29/2019  . Acquired thrombophilia (HCC) 06/22/2019  . Persistent atrial fibrillation (HCC) 05/11/2019  . Atrial flutter (HCC)   . Atrial fibrillation with RVR (HCC) 05/01/2019  . Essential hypertension   . Atrial fibrillation (HCC) 07/14/2018  . Vaginal dryness 01/28/2017  . Vaginal atrophy 01/28/2017  . Breast nodule 11/16/2013  . Breast mass, right 12/22/2012  . OVERWEIGHT 04/13/2009  . GENERALIZED ANXIETY DISORDER 04/13/2009  . Coronary artery disease involving native heart without angina pectoris 04/13/2009  . PALPITATIONS 04/03/2009    Past Surgical History:  Procedure Laterality Date  . APPENDECTOMY    . ATRIAL FIBRILLATION ABLATION N/A 08/20/2018   Procedure: ATRIAL FIBRILLATION ABLATION;  Surgeon: Regan Lemming, MD;  Location: [facility] INVASIVE CV LAB;  Service: Cardiovascular;  Laterality: N/A;  . ATRIAL FIBRILLATION ABLATION N/A 05/11/2019   Procedure: ATRIAL FIBRILLATION ABLATION;  Surgeon: Regan Lemming, MD;  Location: [facility] INVASIVE CV LAB;  Service: Cardiovascular;  Laterality: N/A;    . CARDIAC CATHETERIZATION  2005    cone  . CARDIOVERSION N/A 05/02/2019   Procedure: CARDIOVERSION;  Surgeon: Jodelle Red, MD;  Location: [facility] ENDOSCOPY;  Service: Cardiovascular;  Laterality: N/A;  . COLONOSCOPY    . HERNIA REPAIR    . LEFT HEART CATHETERIZATION WITH CORONARY ANGIOGRAM N/A 04/12/2012   Procedure: LEFT HEART CATHETERIZATION WITH CORONARY ANGIOGRAM;  Surgeon: Kathleene Hazel, MD;  Location: [facility] CATH LAB;  Service: Cardiovascular;  Laterality: N/A;  . TOTAL ABDOMINAL HYSTERECTOMY       OB History    Gravida  1   Para      Term      Preterm      AB  1   Living  0     SAB  1   TAB      Ectopic      Multiple      Live Births              Family History  Problem Relation Age of Onset  . Coronary artery disease Father        mid 4s  . Hypertension Mother   . Stroke Brother   . Transient ischemic attack Brother   . Other Brother        on oxygen  . Hypertension Brother   . Stroke Brother   . Heart disease Brother   . Heart attack Brother   . Other Sister  a fib, restless leg  . Other Sister        pressure in eyes    Social History   Tobacco Use  . Smoking status: Never Smoker  . Smokeless tobacco: Never Used  Substance Use Topics  . Alcohol use: Yes    Comment: rarely; glass of wine with dinner  . Drug use: No    Home Medications Prior to Admission medications   Medication Sig Start Date End Date Taking? Authorizing Provider  acetaminophen (TYLENOL) 650 MG CR tablet Take 650 mg by mouth 2 (two) times daily.     [provider]  apixaban (ELIQUIS) 5 MG TABS tablet Take 5 mg by mouth 2 (two) times daily.    [provider]  Calcium Carb-Cholecalciferol (CALCIUM 600+D3 PO) Take 1 tablet by mouth daily.     [provider]  diltiazem (CARDIZEM CD) 180 MG 24 hr capsule Take 1 capsule (180 mg total) by mouth daily. 05/23/19   Camnitz, Andree Coss, MD  flecainide (TAMBOCOR) 50 MG tablet  Take 1 tablet (50 mg total) by mouth 2 (two) times daily. 10/18/19   Camnitz, Will Daphine Deutscher, MD  fluticasone (FLONASE) 50 MCG/ACT nasal spray Place 2 sprays into both nostrils daily as needed for allergies or rhinitis.    [provider]  gabapentin (NEURONTIN) 100 MG capsule Take 100 mg by mouth at bedtime.    [provider]  metroNIDAZOLE (METROCREAM) 0.75 % cream Apply 1 application topically 2 (two) times daily as needed (rosacea).  04/27/19   [provider]  Multiple Vitamin (MULTIVITAMIN WITH MINERALS) TABS tablet Take 1 tablet by mouth daily. Centrum Silver for Women     [provider]  omeprazole (PRILOSEC) 20 MG capsule Take 20 mg by mouth 2 (two) times daily before a meal.  06/06/19   [provider]  sucralfate (CARAFATE) 1 g tablet Take 1 tablet (1 g total) by mouth 4 (four) times daily as needed. 11/29/19   Anice Paganini, NP    Allergies    Amiodarone and Flomax [tamsulosin hcl]  Review of Systems   Review of Systems A comprehensive review of systems was completed and negative except as noted in HPI.   Physical Exam Updated Vital Signs BP (!) 151/70   Pulse 64   Temp 98.3 F (36.8 C) (Oral)   Resp 16   Ht 5\' 7"  (1.702 m)   Wt 88 kg   SpO2 99%   BMI 30.38 kg/m   Physical Exam Vitals and nursing note reviewed.  Constitutional:      Appearance: Normal appearance.  HENT:     Head: Normocephalic and atraumatic.     Nose: Nose normal.     Mouth/Throat:     Mouth: Mucous membranes are moist.  Eyes:     Extraocular Movements: Extraocular movements intact.     Conjunctiva/sclera: Conjunctivae normal.  Cardiovascular:     Rate and Rhythm: Normal rate.  Pulmonary:     Effort: Pulmonary effort is normal.     Breath sounds: Normal breath sounds.  Abdominal:     General: Abdomen is flat.     Palpations: Abdomen is soft.     Tenderness: There is no abdominal tenderness.  Musculoskeletal:        General: No swelling. Normal  range of motion.     Cervical back: Neck supple. No tenderness.  Skin:    General: Skin is warm and dry.     Comments: Bruising to 1st and  2nd toe on both feet, abrasion to L anterior lower leg and R hand, mild tenderness to L shoulder, no deformity  Neurological:     General: No focal deficit present.     Mental Status: She is alert.  Psychiatric:        Mood and Affect: Mood normal.     ED Results / Procedures / Treatments   Labs (all labs ordered are listed, but only abnormal results are displayed) Labs Reviewed - No data to display  EKG None  Radiology CT Head Wo Contrast  Result Date: 01/04/2020 CLINICAL DATA:  Fall on Eliquis. EXAM: CT HEAD WITHOUT CONTRAST CT CERVICAL SPINE WITHOUT CONTRAST TECHNIQUE: Multidetector CT imaging of the head and cervical spine was performed following the standard protocol without intravenous contrast. Multiplanar CT image reconstructions of the cervical spine were also generated. COMPARISON:  03/17/2018 head CT. 12/06/2017 CT head and cervical spine. FINDINGS: CT HEAD FINDINGS Brain: No acute infarct or intracranial hemorrhage. No mass lesion. No midline shift, ventriculomegaly or extra-axial fluid collection. Vascular: No hyperdense vessel. Bilateral carotid siphon and left V4 segment atherosclerotic calcifications. Skull: Negative for fracture or focal lesion. Sinuses/Orbits: Normal orbits. Clear paranasal sinuses. No mastoid effusion. Other: None. CT CERVICAL SPINE FINDINGS Alignment: Minimal grade 1 C6-7 anterolisthesis, likely degenerative. Skull base and vertebrae: No acute fracture. No primary bone lesion or focal pathologic process. Soft tissues and spinal canal: No prevertebral fluid or swelling. No visible canal hematoma. Disc levels: Multilevel endplate osteophytosis and mild disc space loss. Upper chest: Clear lung apices. Enlarged multinodular thyroid gland. Other: Bilateral carotid bifurcation atherosclerotic calcifications. IMPRESSION: No  acute intracranial process. No fracture or traumatic listhesis. Multilevel spondylosis with minimal grade 1 C6-7 degenerative anterolisthesis. Multinodular goiter. Electronically Signed   By: Primitivo Gauze M.D.   On: 01/04/2020 14:40   CT Cervical Spine Wo Contrast  Result Date: 01/04/2020 CLINICAL DATA:  Fall on Eliquis. EXAM: CT HEAD WITHOUT CONTRAST CT CERVICAL SPINE WITHOUT CONTRAST TECHNIQUE: Multidetector CT imaging of the head and cervical spine was performed following the standard protocol without intravenous contrast. Multiplanar CT image reconstructions of the cervical spine were also generated. COMPARISON:  03/17/2018 head CT. 12/06/2017 CT head and cervical spine. FINDINGS: CT HEAD FINDINGS Brain: No acute infarct or intracranial hemorrhage. No mass lesion. No midline shift, ventriculomegaly or extra-axial fluid collection. Vascular: No hyperdense vessel. Bilateral carotid siphon and left V4 segment atherosclerotic calcifications. Skull: Negative for fracture or focal lesion. Sinuses/Orbits: Normal orbits. Clear paranasal sinuses. No mastoid effusion. Other: None. CT CERVICAL SPINE FINDINGS Alignment: Minimal grade 1 C6-7 anterolisthesis, likely degenerative. Skull base and vertebrae: No acute fracture. No primary bone lesion or focal pathologic process. Soft tissues and spinal canal: No prevertebral fluid or swelling. No visible canal hematoma. Disc levels: Multilevel endplate osteophytosis and mild disc space loss. Upper chest: Clear lung apices. Enlarged multinodular thyroid gland. Other: Bilateral carotid bifurcation atherosclerotic calcifications. IMPRESSION: No acute intracranial process. No fracture or traumatic listhesis. Multilevel spondylosis with minimal grade 1 C6-7 degenerative anterolisthesis. Multinodular goiter. Electronically Signed   By: Primitivo Gauze M.D.   On: 01/04/2020 14:40   DG Shoulder Left  Result Date: 01/04/2020 CLINICAL DATA:  Fall. EXAM: LEFT SHOULDER - 2+  VIEW COMPARISON:  04/29/2019 chest radiograph. FINDINGS: There is no evidence of fracture or dislocation. There is no evidence of arthropathy or other focal bone abnormality. Soft tissues are unremarkable. Visualized left lung is clear. IMPRESSION: No acute osseous abnormality. Electronically Signed   By: Primitivo Gauze  M.D.   On: 01/04/2020 14:26   DG Tibia/Fibula Left Port  Result Date: 01/04/2020 CLINICAL DATA:  Left lower leg pain after fall today. EXAM: PORTABLE LEFT TIBIA AND FIBULA - 2 VIEW COMPARISON:  None. FINDINGS: No fracture or dislocation is noted. No soft tissue abnormality is noted. IMPRESSION: Negative. Electronically Signed   By: Lupita Raider M.D.   On: 01/04/2020 14:28   DG Foot Complete Left  Result Date: 01/04/2020 CLINICAL DATA:  Left foot pain after a fall down 2 stairs today. Initial encounter. EXAM: LEFT FOOT - COMPLETE 3+ VIEW COMPARISON:  None. FINDINGS: No acute bony or joint abnormality is seen. Moderate first MTP osteoarthritis is present. There is scattered interphalangeal joint osteoarthritis. Small plantar calcaneal spur noted. Soft tissues unremarkable. IMPRESSION: No acute abnormality. Scattered interphalangeal joint and first MTP osteoarthritis. Electronically Signed   By: Drusilla Kanner M.D.   On: 01/04/2020 14:27   DG Foot Complete Right  Result Date: 01/04/2020 CLINICAL DATA:  Right foot pain after a fall down 2 stairs today. Initial encounter. EXAM: RIGHT FOOT COMPLETE - 3+ VIEW COMPARISON:  None. FINDINGS: No acute bony or joint abnormality is identified. The patient has moderate first MTP osteoarthritis. Scattered interphalangeal joint osteoarthritis also noted. Soft tissues are unremarkable. IMPRESSION: No acute abnormality. First MTP osteoarthritis. Electronically Signed   By: Drusilla Kanner M.D.   On: 01/04/2020 14:26    Procedures Procedures (including critical care time)  Medications Ordered in ED Medications  Tdap (BOOSTRIX) injection 0.5  mL (has no administration in time range)    ED Course  I have reviewed the triage vital signs and the nursing notes.  Pertinent labs & imaging results that were available during my care of the patient were reviewed by me and considered in my medical decision making (see chart for details).    MDM Rules/Calculators/A&P                      Patient with mechanical fall, imaging neg for fracture or intracranial injury. Update TDaP. Standard wound care PCP followup.  Final Clinical Impression(s) / ED Diagnoses Final diagnoses:  Fall  Fall, initial encounter  Injury of head, initial encounter  Abrasion, multiple sites  Contusion, multiple sites    Rx / DC Orders ED Discharge Orders    None       Hailey Savoy, MD 01/04/20 1527

## 2020-01-04 NOTE — ED Triage Notes (Addendum)
Pt c/o fall down 2 wood stairs today while taking out the trash. Pt c/o left shoulder pain and first and second toe pain on bilateral feet. Pt has abrasions to LLE and right hand. Pt reports she fell on her left side and hit the left side of her forehead, although she denies any pain in the head. Pt is currently on Eliquis.

## 2020-01-10 ENCOUNTER — Ambulatory Visit: Payer: Medicare PPO | Admitting: Nurse Practitioner

## 2020-01-10 ENCOUNTER — Encounter: Payer: Self-pay | Admitting: Nurse Practitioner

## 2020-01-10 ENCOUNTER — Other Ambulatory Visit: Payer: Self-pay

## 2020-01-10 VITALS — BP 142/78 | HR 70 | Temp 97.1°F | Ht 67.0 in | Wt 197.6 lb

## 2020-01-10 DIAGNOSIS — R1013 Epigastric pain: Secondary | ICD-10-CM | POA: Diagnosis not present

## 2020-01-10 DIAGNOSIS — K219 Gastro-esophageal reflux disease without esophagitis: Secondary | ICD-10-CM

## 2020-01-10 NOTE — Assessment & Plan Note (Signed)
Chronic history of GERD, has been on omeprazole for a number years.  In general her symptoms are somewhat well managed.  However, she does have unpredictable flares.  She is wondering if she possibly has the beginnings of an ulcer or if she needs to be on a different medication.  We discussed options including monitoring currently on omeprazole.  She is wanting to hold off on endoscopy because she is worried about stopping her blood thinner for the procedure.  We also discussed the possibility of changing to a different PPI.  Overall we decided that she would continue her current omeprazole twice daily, if no improvement in 2 weeks she will call us and let us know and we can consider changing to a different PPI.  As per HPI, she has already been on omeprazole and pantoprazole.  She has not been on esomeprazole, lansoprazole, rabeprazole, or dexlansoprazole.  Further recommendations will follow.  We can consider EGD in the future depending on her symptom progression.  Keep follow-up appointment in 2 months.

## 2020-01-10 NOTE — Progress Notes (Signed)
Referring Provider: Richardean Chimera, MD Primary Care Physician:  Richardean Chimera, MD Primary GI:  Dr. Jena Gauss  Chief Complaint  Patient presents with  . Gastroesophageal Reflux    f/u. Doing okay  . Abdominal Pain    comes/goes. Stop carafate as it caused lots of problems    HPI:   Hailey [name] is a 79 y.o. female who presents for follow-up on abdominal pain.  The patient was last seen in our office 11/29/2019 for GERD and epigastric pain.  Previous esophageal/chest pain in the emergency room with normal EKG and GI cocktail/Carafate both helped a lot.  She was started on omeprazole 20 mg twice a day and Carafate 4 times a day.  Previous EGD in 1995 with a small hiatal hernia and minimal distal esophagitis, mild diffuse gastritis, slight inflammation of the pyloric channel, mild duodenitis.  Overall felt likely due to hyperacidity and recommended Prilosec.  At her last visit notes longstanding history of GERD but no significant symptoms in years.  In the evening several days prior she had a sudden burning so took omeprazole which helped somewhat and although her symptoms improved and never stopped.  Noted bloating as well.  Much improved on twice daily omeprazole, rarely requires Tums.  Nausea attributed to adverse effect from A. fib medication.  No other overt GI complaints.  Recommended continue Prilosec 20 mg twice daily, Carafate up to 4 times a day as needed for breakthrough, follow-up in months.  Today she states she's doing ok overall. Tried Carafate again based on recommendations at last visit and had side effects and stopped taking it and they resolved. However, since stopping Carafate still has upper abdominal pain; flares occur anywhere from every other day to every several days. She doesn't what surgery but is hoping to get a better response. No identified triggers. Overall she feels she's better than she was. Denies N/V, hematochezia, melena, fever, chills, unintentional weight  loss. Denies URI or flu-like symptoms. Denies loss of sense of taste or smell. The patient has received COVID-19 vaccination(s). Denies chest pain, dyspnea, dizziness, lightheadedness, syncope, near syncope. Denies any other upper or lower GI symptoms.  Has been on omeprazole and pantoprazole  Has not been on dexlansoprazole, esomeprazole, lansoprazole, and rabeprazole  Past Medical History:  Diagnosis Date  . Atrial fibrillation (HCC)   . Breast nodule 11/16/2013  . H/O hematuria   . HLD (hyperlipidemia)   . Hypertension   . 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 Migraine headache   . Palpitations   . Restless leg syndrome   . Vaginal atrophy     Past Surgical History:  Procedure Laterality Date  . APPENDECTOMY    . ATRIAL FIBRILLATION ABLATION N/A 08/20/2018   Procedure: ATRIAL FIBRILLATION ABLATION;  Surgeon: Regan Lemming, MD;  Location: [facility] INVASIVE CV LAB;  Service: Cardiovascular;  Laterality: N/A;  . ATRIAL FIBRILLATION ABLATION N/A 05/11/2019   Procedure: ATRIAL FIBRILLATION ABLATION;  Surgeon: Regan Lemming, MD;  Location: [facility] INVASIVE CV LAB;  Service: Cardiovascular;  Laterality: N/A;  . CARDIAC CATHETERIZATION  2005    cone  . CARDIOVERSION N/A 05/02/2019   Procedure: CARDIOVERSION;  Surgeon: Jodelle Red, MD;  Location: [facility] ENDOSCOPY;  Service: Cardiovascular;  Laterality: N/A;  . COLONOSCOPY    . HERNIA REPAIR    . LEFT HEART CATHETERIZATION WITH CORONARY ANGIOGRAM N/A 04/12/2012   Procedure: LEFT HEART CATHETERIZATION WITH CORONARY ANGIOGRAM;  Surgeon: Kathleene Hazel, MD;  Location: [place] CATH LAB;  Service: Cardiovascular;  Laterality: N/A;  . TOTAL ABDOMINAL HYSTERECTOMY      Current Outpatient Medications  Medication Sig Dispense Refill  . acetaminophen (TYLENOL) 650 MG CR tablet Take 650 mg by mouth 2 (two) times daily.     Marland Kitchen apixaban (ELIQUIS) 5 MG TABS tablet Take 5 mg by mouth 2 (two) times daily.    . Calcium  Carb-Cholecalciferol (CALCIUM 600+D3 PO) Take 1 tablet by mouth daily.     . calcium carbonate (TUMS - DOSED IN MG ELEMENTAL CALCIUM) 500 MG chewable tablet Chew 1 tablet by mouth as needed for indigestion or heartburn.    . diltiazem (CARDIZEM CD) 180 MG 24 hr capsule Take 1 capsule (180 mg total) by mouth daily. 30 capsule 8  . flecainide (TAMBOCOR) 50 MG tablet Take 1 tablet (50 mg total) by mouth 2 (two) times daily. 60 tablet 8  . fluticasone (FLONASE) 50 MCG/ACT nasal spray Place 2 sprays into both nostrils daily as needed for allergies or rhinitis.    Marland Kitchen gabapentin (NEURONTIN) 100 MG capsule Take 100 mg by mouth at bedtime.    . metroNIDAZOLE (METROCREAM) 0.75 % cream Apply 1 application topically 2 (two) times daily as needed (rosacea).     . Multiple Vitamin (MULTIVITAMIN WITH MINERALS) TABS tablet Take 1 tablet by mouth daily. Centrum Silver for Women     . omeprazole (PRILOSEC) 20 MG capsule Take 20 mg by mouth 2 (two) times daily before a meal.     . sucralfate (CARAFATE) 1 g tablet Take 1 tablet (1 g total) by mouth 4 (four) times daily as needed. (Patient not taking: Reported on 01/10/2020) 30 tablet 0   No current facility-administered medications for this visit.    Allergies as of 01/10/2020 - Review Complete 01/10/2020  Allergen Reaction Noted  . Amiodarone Itching 04/24/2018  . Flomax [tamsulosin hcl] Nausea And Vomiting 01/24/2015    Family History  Problem Relation Age of Onset  . Coronary artery disease Father        mid 32s  . Hypertension Mother   . Stroke Brother   . Transient ischemic attack Brother   . Other Brother        on oxygen  . Hypertension Brother   . Stroke Brother   . Heart disease Brother   . Heart attack Brother   . Other Sister        a fib, restless leg  . Other Sister        pressure in eyes    Social History   Socioeconomic History  . Marital status: Single    Spouse name: Not on file  . Number of children: 0  . Years of education:  College  . Highest education level: Not on file  Occupational History  . Occupation: retired  Tobacco Use  . Smoking status: Never Smoker  . Smokeless tobacco: Never Used  Substance and Sexual Activity  . Alcohol use: Yes    Comment: rarely; glass of wine with dinner  . Drug use: No  . Sexual activity: Never    Birth control/protection: Surgical    Comment: hyst  Other Topics Concern  . Not on file  Social History Narrative   Single. Retired.    Patient lives at home alone.   Caffeine Use: none   Social Determinants of Health   Financial Resource Strain:   . Difficulty of Paying Living Expenses:   Food Insecurity:   . Worried About Running  Out of Food in the Last Year:   . Ran Out of Food in the Last Year:   Transportation Needs:   . Lack of Transportation (Medical):   Marland Kitchen Lack of Transportation (Non-Medical):   Physical Activity:   . Days of Exercise per Week:   . Minutes of Exercise per Session:   Stress:   . Feeling of Stress :   Social Connections:   . Frequency of Communication with Friends and Family:   . Frequency of Social Gatherings with Friends and Family:   . Attends Religious Services:   . Active Member of Clubs or Organizations:   . Attends Banker Meetings:   Marland Kitchen Marital Status:     Subjective: Review of Systems  Constitutional: Negative for chills, fever, malaise/fatigue and weight loss.  HENT: Negative for congestion and sore throat.   Respiratory: Negative for cough and shortness of breath.   Cardiovascular: Negative for chest pain and palpitations.  Gastrointestinal: Positive for heartburn (intermittent flares). Negative for abdominal pain, blood in stool, diarrhea, melena, nausea and vomiting.  Musculoskeletal: Negative for joint pain and myalgias.  Skin: Negative for rash.  Neurological: Negative for dizziness and weakness.  Endo/Heme/Allergies: Does not bruise/bleed easily.  Psychiatric/Behavioral: Negative for depression. The  patient is not nervous/anxious.   All other systems reviewed and are negative.    Objective: BP (!) 142/78   Pulse 70   Temp (!) 97.1 F (36.2 C) (Oral)   Ht 5\' 7"  (1.702 m)   Wt 197 lb 9.6 oz (89.6 kg)   BMI 30.95 kg/m  Physical Exam Vitals and nursing note reviewed.  Constitutional:      General: She is not in acute distress.    Appearance: Normal appearance. She is well-developed. She is not ill-appearing, toxic-appearing or diaphoretic.  HENT:     Head: Normocephalic and atraumatic.     Nose: No congestion or rhinorrhea.  Eyes:     General: No scleral icterus. Cardiovascular:     Rate and Rhythm: Normal rate and regular rhythm.     Heart sounds: Normal heart sounds.  Pulmonary:     Effort: Pulmonary effort is normal. No respiratory distress.     Breath sounds: Normal breath sounds.  Abdominal:     General: Bowel sounds are normal.     Palpations: Abdomen is soft. There is no hepatomegaly, splenomegaly or mass.     Tenderness: There is no abdominal tenderness. There is no guarding or rebound.     Hernia: No hernia is present.  Skin:    General: Skin is warm and dry.     Coloration: Skin is not jaundiced.     Findings: No rash.  Neurological:     General: No focal deficit present.     Mental Status: She is alert and oriented to person, place, and time.  Psychiatric:        Attention and Perception: Attention normal.        Mood and Affect: Mood normal.        Speech: Speech normal.        Behavior: Behavior normal.        Thought Content: Thought content normal.        Cognition and Memory: Cognition and memory normal.       01/10/2020 4:06 PM   Disclaimer: This note was dictated with voice recognition software. Similar sounding words can inadvertently be transcribed and may not be corrected upon review.

## 2020-01-10 NOTE — Patient Instructions (Addendum)
Your health issues we discussed today were:   GERD (reflux/heartburn): 1. As we discussed, continue taking omeprazole 20 mg twice a day 2. In 2 weeks if you are not doing any better you can call our office and ask to leave a message.  Tell them "at my visit we agreed that I would call in 2 weeks if not any better and we could change my acid blocker medicine."  I have left a detailed note in your chart about this, in case I am not here 3. Keep benign your abdominal pain and let us know if it gets any worse 4. Call us for any worsening or severe symptoms in general  Overall I recommend:  1. Continue your other current medications 2. Return for follow-up at your appointment already scheduled in August 3. Call us if you have any questions or concerns   ---------------------------------------------------------------  I am glad you have gotten your COVID-19 vaccination!  Even though you are fully vaccinated you should continue to follow CDC and state/local guidelines.  ---------------------------------------------------------------   At [facility] we value your feedback. You may receive a survey about your visit today. Please share your experience as we strive to create trusting relationships with our patients to provide genuine, compassionate, quality care.  We appreciate your understanding and patience as we review any laboratory studies, imaging, and other diagnostic tests that are ordered as we care for you. Our office policy is 5 business days for review of these results, and any emergent or urgent results are addressed in a timely manner for your best interest. If you do not hear from our office in 1 week, please contact us.   We also encourage the use of MyChart, which contains your medical information for your review as well. If you are not enrolled in this feature, an access code is on this after visit summary for your convenience. Thank you for allowing [place] to be involved  in your care.  It was great to see you today!  I hope you have a great Summer!!

## 2020-01-10 NOTE — Assessment & Plan Note (Signed)
Recurrent/persistent epigastric pain that is generally associated with her GERD-like symptoms.  She did have an episode of "twisting/pulling" in her stomach/epigastric area about a week ago and she is not sure exactly what it was.  She was wondering if she had an incarcerated hernia.  We discussed the symptoms of this and I feel is unlikely.  She has had hernia repair before and possibly had some pulling of scar tissue.  Recommend she continue to monitor and notify us of any worsening or recurrent symptoms.

## 2020-01-18 DIAGNOSIS — N183 Chronic kidney disease, stage 3 unspecified: Secondary | ICD-10-CM | POA: Diagnosis not present

## 2020-01-18 DIAGNOSIS — K219 Gastro-esophageal reflux disease without esophagitis: Secondary | ICD-10-CM | POA: Diagnosis not present

## 2020-01-18 DIAGNOSIS — R5382 Chronic fatigue, unspecified: Secondary | ICD-10-CM | POA: Diagnosis not present

## 2020-01-18 DIAGNOSIS — I10 Essential (primary) hypertension: Secondary | ICD-10-CM | POA: Diagnosis not present

## 2020-01-18 DIAGNOSIS — E782 Mixed hyperlipidemia: Secondary | ICD-10-CM | POA: Diagnosis not present

## 2020-01-23 ENCOUNTER — Telehealth: Payer: Self-pay | Admitting: *Deleted

## 2020-01-23 NOTE — Telephone Encounter (Signed)
Let's provide Dexilant samples. She has previously been on omeprazole and pantoprazole. Have her call with update on Dexilant. I anticipate she will need an EGD. Please have her call next week with how Dexilant is working and give update to [facility].

## 2020-01-23 NOTE — Telephone Encounter (Signed)
Pt called in and said that EG had told her to call in with 2 wk progress report.  Pt says she is bloated, bad taste in mouth, and abd pain below rib cage is worse.  She said that EG had told her to let us know if acid blocker needed changing.  She said that she thinks it needs changing.  She said she wonders if she has a hernia.  She states she can't take Pantoprazole.  She said she didn't know if she needed ov or not.  Our first available ov is Aug right now.

## 2020-01-23 NOTE — Telephone Encounter (Signed)
Called pt and informed her that we were leaving Dexilant samples up front for her to try (per AB).  She was informed to call with update for EG next week with how Dexilant is working.  Pt voiced understanding.

## 2020-01-26 ENCOUNTER — Telehealth: Payer: Self-pay | Admitting: Internal Medicine

## 2020-01-26 DIAGNOSIS — I340 Nonrheumatic mitral (valve) insufficiency: Secondary | ICD-10-CM | POA: Diagnosis not present

## 2020-01-26 DIAGNOSIS — G2581 Restless legs syndrome: Secondary | ICD-10-CM | POA: Diagnosis not present

## 2020-01-26 DIAGNOSIS — E042 Nontoxic multinodular goiter: Secondary | ICD-10-CM | POA: Diagnosis not present

## 2020-01-26 DIAGNOSIS — K219 Gastro-esophageal reflux disease without esophagitis: Secondary | ICD-10-CM | POA: Diagnosis not present

## 2020-01-26 DIAGNOSIS — Z6829 Body mass index (BMI) 29.0-29.9, adult: Secondary | ICD-10-CM | POA: Diagnosis not present

## 2020-01-26 DIAGNOSIS — R1013 Epigastric pain: Secondary | ICD-10-CM

## 2020-01-26 DIAGNOSIS — I10 Essential (primary) hypertension: Secondary | ICD-10-CM | POA: Diagnosis not present

## 2020-01-26 DIAGNOSIS — E782 Mixed hyperlipidemia: Secondary | ICD-10-CM | POA: Diagnosis not present

## 2020-01-26 NOTE — Telephone Encounter (Signed)
Pt returned call while nurse was at lunch. She said that she would call back since she wasn't at home at the moment.

## 2020-01-26 NOTE — Telephone Encounter (Signed)
Lmom, waiting on a return call.  

## 2020-01-26 NOTE — Telephone Encounter (Signed)
Spoke with pt. Pt had a 6 month f/u with her PCP and has decided to have an EGD. Pt is going to continue to take Dexilant 60 mg. Pt has only taken 3 pills and will see if the Dexilant will help her. Please advise on EGD. Pt is aware that EG is out of the office and will return next week.

## 2020-01-26 NOTE — Telephone Encounter (Signed)
Pt came to front office window saying she has been seeing EG and has changed her mind since talking with her PCP about having an EGD done. She wants to go ahead and schedule endoscopy and also said that the dexilant she's been taking is causing her to feel nausea and abd discomfort. Please advise and call her at [phone] or [phone]

## 2020-02-08 ENCOUNTER — Other Ambulatory Visit: Payer: Self-pay | Admitting: Cardiology

## 2020-02-08 ENCOUNTER — Ambulatory Visit (HOSPITAL_COMMUNITY)
Admission: RE | Admit: 2020-02-08 | Discharge: 2020-02-08 | Disposition: A | Discharge status: Home / Self Care | Payer: Medicare PPO | Source: Ambulatory Visit | Attending: Adult Health | Admitting: Adult Health

## 2020-02-08 ENCOUNTER — Other Ambulatory Visit: Payer: Self-pay

## 2020-02-08 DIAGNOSIS — Z1231 Encounter for screening mammogram for malignant neoplasm of breast: Secondary | ICD-10-CM | POA: Insufficient documentation

## 2020-02-08 MED ORDER — OMEPRAZOLE 40 MG PO CPDR: 40.0000 mg | DELAYED_RELEASE_CAPSULE | Freq: Two times a day (BID) | ORAL | 3 refills | Status: DC

## 2020-02-08 NOTE — Telephone Encounter (Signed)
Tell her I'm going to increase her Prilosec to 40 mg bid. Since she has had no improvement despite multiple PPIs, and EGD is appropriate. RMR will likely need her to be seen in the office to schedule beauce it's been a month and she's on Eliquis & Neurontin (? Propofol/MAC).  Please have her scheduled for first available and placed on cancellation list.

## 2020-02-08 NOTE — Addendum Note (Signed)
Addended by: Delane Ginger, Luane Rochon A on: 02/08/2020 04:13 PM   Modules accepted: Orders

## 2020-02-08 NOTE — Telephone Encounter (Signed)
Spoke with pt. Pt is aware that her RX is increasing to Priolsec 40 mg bid. Pt states she was finally beginning to notice some improvement but is ok with trying the Priolsec 40 mg bid. Pt has an apt on 03/13/20 and is ok with that date. EG does pt need to be seen sooner?

## 2020-02-09 NOTE — Telephone Encounter (Signed)
That should be fine. Have her call for any worsening problems

## 2020-02-09 NOTE — Telephone Encounter (Signed)
Noted. Pt is aware 

## 2020-02-13 ENCOUNTER — Other Ambulatory Visit (HOSPITAL_COMMUNITY): Payer: Self-pay | Admitting: Adult Health

## 2020-02-13 DIAGNOSIS — R928 Other abnormal and inconclusive findings on diagnostic imaging of breast: Secondary | ICD-10-CM

## 2020-02-21 ENCOUNTER — Ambulatory Visit (HOSPITAL_COMMUNITY)
Admission: RE | Admit: 2020-02-21 | Discharge: 2020-02-21 | Disposition: A | Discharge status: Home / Self Care | Payer: Medicare PPO | Source: Ambulatory Visit | Attending: Adult Health | Admitting: Adult Health

## 2020-02-21 ENCOUNTER — Other Ambulatory Visit: Payer: Self-pay

## 2020-02-21 DIAGNOSIS — R928 Other abnormal and inconclusive findings on diagnostic imaging of breast: Secondary | ICD-10-CM

## 2020-02-21 DIAGNOSIS — N6011 Diffuse cystic mastopathy of right breast: Secondary | ICD-10-CM | POA: Diagnosis not present

## 2020-02-22 DIAGNOSIS — L570 Actinic keratosis: Secondary | ICD-10-CM | POA: Diagnosis not present

## 2020-02-22 DIAGNOSIS — L814 Other melanin hyperpigmentation: Secondary | ICD-10-CM | POA: Diagnosis not present

## 2020-02-22 DIAGNOSIS — D1801 Hemangioma of skin and subcutaneous tissue: Secondary | ICD-10-CM | POA: Diagnosis not present

## 2020-02-22 DIAGNOSIS — D045 Carcinoma in situ of skin of trunk: Secondary | ICD-10-CM | POA: Diagnosis not present

## 2020-02-22 DIAGNOSIS — L821 Other seborrheic keratosis: Secondary | ICD-10-CM | POA: Diagnosis not present

## 2020-02-22 DIAGNOSIS — C44519 Basal cell carcinoma of skin of other part of trunk: Secondary | ICD-10-CM | POA: Diagnosis not present

## 2020-02-22 DIAGNOSIS — Z85828 Personal history of other malignant neoplasm of skin: Secondary | ICD-10-CM | POA: Diagnosis not present

## 2020-02-27 ENCOUNTER — Telehealth: Payer: Self-pay | Admitting: Internal Medicine

## 2020-02-27 NOTE — Telephone Encounter (Signed)
Lmom, waiting on a return call.  

## 2020-02-27 NOTE — Telephone Encounter (Signed)
Pt was asked to take Omeprazole 40 mg bid a few weeks ago. Since pt started taking Omeprazole 40 bid, pt has developed lower abdominal pain, nausea, diarrhea x 2 weeks. Pt would like to decrease back down to Omeprazole 20 mg daily if that is ok. Pt has taken Omeprazole 20 mg previously and felt she has done well on it. Please advise.

## 2020-02-27 NOTE — Telephone Encounter (Signed)
Yes, that is fine to decrease back to 20 mg.

## 2020-02-27 NOTE — Telephone Encounter (Signed)
Pt called asking to speak with AM. Please call [phone]

## 2020-02-28 ENCOUNTER — Encounter (HOSPITAL_COMMUNITY): Payer: Medicare PPO

## 2020-02-28 ENCOUNTER — Ambulatory Visit (HOSPITAL_COMMUNITY): Payer: Medicare PPO

## 2020-02-28 NOTE — Telephone Encounter (Signed)
Left a detailed message on machine for pt. Pt notified that it's ok to decrease back to 20 mg of Omeprazole.

## 2020-03-05 ENCOUNTER — Other Ambulatory Visit: Payer: Self-pay

## 2020-03-05 ENCOUNTER — Ambulatory Visit: Payer: Medicare PPO | Admitting: Cardiology

## 2020-03-05 ENCOUNTER — Encounter: Payer: Self-pay | Admitting: Cardiology

## 2020-03-05 VITALS — BP 140/72 | HR 68 | Ht 67.0 in | Wt 193.0 lb

## 2020-03-05 DIAGNOSIS — I4819 Other persistent atrial fibrillation: Secondary | ICD-10-CM

## 2020-03-05 MED ORDER — FLECAINIDE ACETATE 100 MG PO TABS
100.0000 mg | ORAL_TABLET | Freq: Two times a day (BID) | ORAL | 3 refills | Status: DC
Start: 2020-03-05 — End: 2020-03-15

## 2020-03-05 NOTE — Patient Instructions (Addendum)
Medication Instructions:  Your physician has recommended you make the following change in your medication:   **  Increase your Flecainide to 100mg  twice daily   *If you need a refill on your cardiac medications before your next appointment, please call your pharmacy*   Lab Work: None ordered.  If you have labs (blood work) drawn today and your tests are completely normal, you will receive your results only by: MyChart Message (if you have MyChart) OR . A paper copy in the mail If you have any lab test that is abnormal or we need to change your treatment, we will call you to review the results.   Testing/Procedures: ECG in 2 weeks - Monday 03/19/2020 at 11am - Nurse Visit   Follow-Up: At [facility], you and your health needs are our priority.  As part of our continuing mission to provide you with exceptional heart care, we have created designated Provider Care Teams.  These Care Teams include your primary Cardiologist (physician) and Advanced Practice Providers (APPs -  Physician Assistants and Nurse Practitioners) who all work together to provide you with the care you need, when you need it.  We recommend signing up for the patient portal called "MyChart".  Sign up information is provided on this After Visit Summary.  MyChart is used to connect with patients for Virtual Visits (Telemedicine).  Patients are able to view lab/test results, encounter notes, upcoming appointments, etc.  Non-urgent messages can be sent to your provider as well.   To learn more about what you can do with MyChart, go to [facility].    Your next appointment:   3 month(s)  The format for your next appointment:   In Person  Provider:   [email or web address], MD

## 2020-03-05 NOTE — Progress Notes (Signed)
Electrophysiology Office Note   Date:  03/05/2020   ID:  Hailey [name], Hailey [name] 02-Jul-1941, MRN [id number]  PCP:  Richardean Chimera, MD  Cardiologist: Purvis Sheffield Primary Electrophysiologist:  Terrion Poblano Jorja Loa, MD    No chief complaint on file.    History of Present Illness: Hailey [name] is a 79 y.o. female who is being seen today for the evaluation of atrial fibrillation at the request of Richardean Chimera, MD. Presenting today for electrophysiology evaluation.  She has a history of atrial fibrillation on amiodarone.  She was admitted to the hospital with dizziness, nausea, weakness, and near syncope.  She was noted to be in sinus bradycardia with heart rates in the 40s to 50s.  Her home Lopressor was stopped which improved her symptoms.  She is now status post AF ablation x2 with the most recent being 05/11/2019.  Today, denies symptoms of palpitations, chest pain, shortness of breath, orthopnea, PND, lower extremity edema, claudication, dizziness, presyncope, syncope, bleeding, or neurologic sequela. The patient is tolerating medications without difficulties.  Overall she is feeling well.  She notes minimal episodes of atrial fibrillation.  Unfortunately approximately a month ago, she had a fall off of the porch at her house.  She was walking down and carrying trash bags.  She hurt her left leg and hit her head.  CT scan showed no issues.  She has been having issues with reflux.  She is currently seeing gastroenterology.    Past Medical History:  Diagnosis Date  . Atrial fibrillation (HCC)   . Breast nodule 11/16/2013  . H/O hematuria   . HLD (hyperlipidemia)   . Hypertension   . 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 Migraine headache   . Palpitations   . Restless leg syndrome   . Vaginal atrophy    Past Surgical History:  Procedure Laterality Date  . APPENDECTOMY    . ATRIAL FIBRILLATION ABLATION N/A 08/20/2018   Procedure: ATRIAL FIBRILLATION ABLATION;  Surgeon: Regan Lemming, MD;  Location: [facility] INVASIVE CV LAB;  Service: Cardiovascular;  Laterality: N/A;  . ATRIAL FIBRILLATION ABLATION N/A 05/11/2019   Procedure: ATRIAL FIBRILLATION ABLATION;  Surgeon: Regan Lemming, MD;  Location: [facility] INVASIVE CV LAB;  Service: Cardiovascular;  Laterality: N/A;  . CARDIAC CATHETERIZATION  2005    cone  . CARDIOVERSION N/A 05/02/2019   Procedure: CARDIOVERSION;  Surgeon: Jodelle Red, MD;  Location: [facility] ENDOSCOPY;  Service: Cardiovascular;  Laterality: N/A;  . COLONOSCOPY    . HERNIA REPAIR    . LEFT HEART CATHETERIZATION WITH CORONARY ANGIOGRAM N/A 04/12/2012   Procedure: LEFT HEART CATHETERIZATION WITH CORONARY ANGIOGRAM;  Surgeon: Kathleene Hazel, MD;  Location: [facility] CATH LAB;  Service: Cardiovascular;  Laterality: N/A;  . TOTAL ABDOMINAL HYSTERECTOMY       Current Outpatient Medications  Medication Sig Dispense Refill  . acetaminophen (TYLENOL) 650 MG CR tablet Take 650 mg by mouth 2 (two) times daily.     Marland Kitchen apixaban (ELIQUIS) 5 MG TABS tablet Take 5 mg by mouth 2 (two) times daily.    . Calcium Carb-Cholecalciferol (CALCIUM 600+D3 PO) Take 1 tablet by mouth daily.     . calcium carbonate (TUMS - DOSED IN MG ELEMENTAL CALCIUM) 500 MG chewable tablet Chew 1 tablet by mouth as needed for indigestion or heartburn.    . diltiazem (CARDIZEM CD) 180 MG 24 hr capsule Take 1 capsule (180 mg total) by mouth daily. 30 capsule 5  .  flecainide (TAMBOCOR) 50 MG tablet Take 1 tablet (50 mg total) by mouth 2 (two) times daily. 60 tablet 8  . fluticasone (FLONASE) 50 MCG/ACT nasal spray Place 2 sprays into both nostrils daily as needed for allergies or rhinitis.    Marland Kitchen gabapentin (NEURONTIN) 100 MG capsule Take 100 mg by mouth at bedtime.    . metroNIDAZOLE (METROCREAM) 0.75 % cream Apply 1 application topically 2 (two) times daily as needed (rosacea).     . Multiple Vitamin (MULTIVITAMIN WITH MINERALS) TABS tablet Take 1 tablet by mouth daily. Centrum Silver for  Women     . omeprazole (PRILOSEC) 40 MG capsule Take 1 capsule (40 mg total) by mouth in the morning and at bedtime. 60 capsule 3   No current facility-administered medications for this visit.    Allergies:   Amiodarone and Flomax [tamsulosin hcl]   Social History:  The patient  reports that she has never smoked. She has never used smokeless tobacco. She reports current alcohol use. She reports that she does not use drugs.   Family History:  The patient's family history includes Coronary artery disease in her father; Heart attack in her brother; Heart disease in her brother; Hypertension in her brother and mother; Other in her brother, sister, and sister; Stroke in her brother and brother; Transient ischemic attack in her brother.   ROS:  Please see the history of present illness.   Otherwise, review of systems is positive for none.   All other systems are reviewed and negative.   PHYSICAL EXAM: VS:  BP 140/72   Pulse 68   Ht 5\' 7"  (1.702 m)   Wt 193 lb (87.5 kg)   SpO2 97%   BMI 30.23 kg/m  , BMI Body mass index is 30.23 kg/m. GEN: Well nourished, well developed, in no acute distress  HEENT: normal  Neck: no JVD, carotid bruits, or masses Cardiac: RRR; no murmurs, rubs, or gallops,no edema  Respiratory:  clear to auscultation bilaterally, normal work of breathing GI: soft, nontender, nondistended, + BS MS: no deformity or atrophy  Skin: warm and dry Neuro:  Strength and sensation are intact Psych: euthymic mood, full affect  EKG:  EKG is ordered today. Personal review of the ekg ordered shows sinus rhythm, rate 57, incomplete right bundle branch block   Recent Labs: 05/01/2019: Magnesium 2.1 11/03/2019: ALT 18; BUN 19; Creatinine, Ser 1.53; Hemoglobin 14.3; Platelets 273; Potassium 3.6; Sodium 136    Lipid Panel  No results found for: CHOL, TRIG, HDL, CHOLHDL, VLDL, LDLCALC, LDLDIRECT   Wt Readings from Last 3 Encounters:  03/05/20 193 lb (87.5 kg)  01/10/20 197 lb 9.6  oz (89.6 kg)  01/04/20 194 lb (88 kg)      Other studies Reviewed: Additional studies/ records that were reviewed today include: TTE 05/20/2017 Review of the above records today demonstrates:  - Left ventricle: The cavity size was normal. Wall thickness was   normal. Systolic function was normal. The estimated ejection   fraction was in the range of 60% to 65%. Wall motion was normal;   there were no regional wall motion abnormalities. Left   ventricular diastolic function parameters were normal. - Aortic valve: Mildly to moderately calcified annulus. Trileaflet.   There was mild regurgitation. - Mitral valve: Calcified annulus. Normal thickness leaflets .   There was moderate regurgitation. - Tricuspid valve: There was mild regurgitation. - Pulmonary arteries: PA peak pressure: 34 mm Hg (S).   ASSESSMENT AND PLAN:  1.  Persistent  atrial fibrillation: Currently on flecainide and Eliquis.  Status post AF ablation 08/20/2018 with repeat ablation 05/11/2019.  CHA2DS2-VASc of 3.  She remains in sinus rhythm.  She has had palpitations that bother her in the morning.  Kalimah Capurro increase flecainide to 100 mg.  We Reid Regas get an ECG in 2 weeks.  2.  Hypertension: Mildly elevated but usually better controlled at home.  No changes.  Current medicines are reviewed at length with the patient today.   The patient does not have concerns regarding her medicines.  The following changes were made today: Increase flecainide  Labs/ tests ordered today include:  Orders Placed This Encounter  Procedures  . EKG 12-Lead     Disposition:   FU with Sanjuan Sawa 3 months  Signed, Khari Mally Jorja Loa, MD  03/05/2020 11:53 AM     [facility] [address] [place] [address] [phone] (office) [phone] (fax)

## 2020-03-12 ENCOUNTER — Telehealth: Payer: Self-pay | Admitting: Cardiology

## 2020-03-12 NOTE — Telephone Encounter (Signed)
Returned call to pt.    Pt has a history of stated symptoms on flecainide.  Per Pt these symptoms were aggravated the last time she tried to increase flecainide to 100 mg BID.  Per shared decision making-Pt will reduce flecainide to 50 mg BID until further advisement from Dr. Elberta Fortis.  Cancelled nurse visit.  Will forward to Dr. Elberta Fortis and nurse to follow up next week. Pt aware.

## 2020-03-12 NOTE — Telephone Encounter (Signed)
Pt c/o medication issue:  1. Name of Medication: flecainide (TAMBOCOR) 100 MG tablet  2. How are you currently taking this medication (dosage and times per day)? 1 tablet twice a day  3. Are you having a reaction (difficulty breathing--STAT)? no  4. What is your medication issue? Patient states her medication was increased from 50 mg to 100 mg on Monday. She states Tuesday after taking it she got a headache and Wednesday the headache went away, but she felt very weak. She states Thursday she had acid reflux and took Tums, which eased it. She states Thursday night 2 hours after taking the 100 mg of flecainide her face felt like it was swelling and tight. She states her mouth felt like it was burning and stinging. She states she continued to take it and get these symptoms, until she decided on Saturday to take 50 mg instead. She states she still had some puffiness, but no burning on Saturday. She states she continued to take the 50 mg and on Sunday she had more stinging and burning. Today she has no stinging or burning, but some puffiness. Please advise.

## 2020-03-13 ENCOUNTER — Ambulatory Visit: Payer: Medicare PPO | Admitting: Nurse Practitioner

## 2020-03-13 NOTE — Progress Notes (Deleted)
Referring Provider: Richardean Chimera, MD Primary Care Physician:  Richardean Chimera, MD Primary GI:  Dr. Jena Gauss  No chief complaint on file.   HPI:   Hailey [name] is a 79 y.o. female who presents for follow-up on GERD.  The patient was last seen in our office 01/10/2020 for GERD and epigastric pain.  Previous ED cardiac work-up normal.  EGD in 1995 a small hiatal hernia and minimal distal esophagitis, mild diffuse gastritis, slight inflammation of the pyloric channel and mild duodenitis.  GERD longstanding but no symptoms in years.  At her last visit she was on Prilosec 20 mg twice daily and Carafate 4 times a day as needed, which she stopped due to side effects.  Began having worsening symptoms after stopping Carafate.  Overall feels she is better than she was.  Previously on omeprazole and pantoprazole.  Recommended continue omeprazole 20 mg twice a day, call if no improvement in 2 weeks, follow-up at scheduled appointment in August.  Previously recommended updating EGD which she held off.  She called a couple weeks later with no improvement on increased dose of omeprazole 40 mg twice a day and this was decreased back to 20 mg.  She decided to proceed with scheduling of EGD.   Today she states   Past Medical History:  Diagnosis Date  . Atrial fibrillation (HCC)   . Breast nodule 11/16/2013  . H/O hematuria   . HLD (hyperlipidemia)   . Hypertension   . 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 Migraine headache   . Palpitations   . Restless leg syndrome   . Vaginal atrophy     Past Surgical History:  Procedure Laterality Date  . APPENDECTOMY    . ATRIAL FIBRILLATION ABLATION N/A 08/20/2018   Procedure: ATRIAL FIBRILLATION ABLATION;  Surgeon: Regan Lemming, MD;  Location: [facility]VASIVE CV [facility];  Service: Cardiovascular;  Laterality: N/A;  . ATRIAL FIBRILLATION ABLATION N/A 05/11/2019   Procedure: ATRIAL FIBRILLATION ABLATION;  Surgeon: Regan Lemming, MD;  Location:  [facility] CV [facility];  Service: Cardiovascular;  Laterality: N/A;  . CARDIAC CATHETERIZATION  2005    cone  . CARDIOVERSION N/A 05/02/2019   Procedure: CARDIOVERSION;  Surgeon: Jodelle Red, MD;  Location: [facility] ENDOSCOPY;  Service: Cardiovascular;  Laterality: N/A;  . COLONOSCOPY    . HERNIA REPAIR    . LEFT HEART CATHETERIZATION WITH CORONARY ANGIOGRAM N/A 04/12/2012   Procedure: LEFT HEART CATHETERIZATION WITH CORONARY ANGIOGRAM;  Surgeon: Kathleene Hazel, MD;  Location: [facility] CATH LAB;  Service: Cardiovascular;  Laterality: N/A;  . TOTAL ABDOMINAL HYSTERECTOMY      Current Outpatient Medications  Medication Sig Dispense Refill  . acetaminophen (TYLENOL) 650 MG CR tablet Take 650 mg by mouth 2 (two) times daily.     Marland Kitchen apixaban (ELIQUIS) 5 MG TABS tablet Take 5 mg by mouth 2 (two) times daily.    . Calcium Carb-Cholecalciferol (CALCIUM 600+D3 PO) Take 1 tablet by mouth daily.     . calcium carbonate (TUMS - DOSED IN MG ELEMENTAL CALCIUM) 500 MG chewable tablet Chew 1 tablet by mouth as needed for indigestion or heartburn.    . diltiazem (CARDIZEM CD) 180 MG 24 hr capsule Take 1 capsule (180 mg total) by mouth daily. 30 capsule 5  . flecainide (TAMBOCOR) 100 MG tablet Take 1 tablet (100 mg total) by mouth 2 (two) times daily. 180 tablet 3  . fluticasone (FLONASE) 50 MCG/ACT nasal spray Place  2 sprays into both nostrils daily as needed for allergies or rhinitis.    Marland Kitchen gabapentin (NEURONTIN) 100 MG capsule Take 100 mg by mouth at bedtime.    . metroNIDAZOLE (METROCREAM) 0.75 % cream Apply 1 application topically 2 (two) times daily as needed (rosacea).     . Multiple Vitamin (MULTIVITAMIN WITH MINERALS) TABS tablet Take 1 tablet by mouth daily. Centrum Silver for Women     . omeprazole (PRILOSEC) 40 MG capsule Take 1 capsule (40 mg total) by mouth in the morning and at bedtime. 60 capsule 3   No current facility-administered medications for this visit.    Allergies as of 03/13/2020 -  Review Complete 03/05/2020  Allergen Reaction Noted  . Amiodarone Itching 04/24/2018  . Flomax [tamsulosin hcl] Nausea And Vomiting 01/24/2015    Family History  Problem Relation Age of Onset  . Coronary artery disease Father        mid 20s  . Hypertension Mother   . Stroke Brother   . Transient ischemic attack Brother   . Other Brother        on oxygen  . Hypertension Brother   . Stroke Brother   . Heart disease Brother   . Heart attack Brother   . Other Sister        a fib, restless leg  . Other Sister        pressure in eyes    Social History   Socioeconomic History  . Marital status: Single    Spouse name: Not on file  . Number of children: 0  . Years of education: College  . Highest education level: Not on file  Occupational History  . Occupation: retired  Tobacco Use  . Smoking status: Never Smoker  . Smokeless tobacco: Never Used  Vaping Use  . Vaping Use: Never used  Substance and Sexual Activity  . Alcohol use: Yes    Comment: rarely; glass of wine with dinner  . Drug use: No  . Sexual activity: Never    Birth control/protection: Surgical    Comment: hyst  Other Topics Concern  . Not on file  Social History Narrative   Single. Retired.    Patient lives at home alone.   Caffeine Use: none   Social Determinants of Health   Financial Resource Strain:   . Difficulty of Paying Living Expenses:   Food Insecurity:   . Worried About Programme researcher, broadcasting/film/video in the Last Year:   . Barista in the Last Year:   Transportation Needs:   . Freight forwarder (Medical):   Marland Kitchen Lack of Transportation (Non-Medical):   Physical Activity:   . Days of Exercise per Week:   . Minutes of Exercise per Session:   Stress:   . Feeling of Stress :   Social Connections:   . Frequency of Communication with Friends and Family:   . Frequency of Social Gatherings with Friends and Family:   . Attends Religious Services:   . Active Member of Clubs or Organizations:     . Attends Banker Meetings:   Marland Kitchen Marital Status:     Subjective: Review of Systems  Constitutional: Negative for chills, fever, malaise/fatigue and weight loss.  HENT: Negative for congestion and sore throat.   Respiratory: Negative for cough and shortness of breath.   Cardiovascular: Negative for chest pain and palpitations.  Gastrointestinal: Negative for abdominal pain, blood in stool, diarrhea, melena, nausea and vomiting.  Musculoskeletal: Negative for  joint pain and myalgias.  Skin: Negative for rash.  Neurological: Negative for dizziness and weakness.  Endo/Heme/Allergies: Does not bruise/bleed easily.  Psychiatric/Behavioral: Negative for depression. The patient is not nervous/anxious.   All other systems reviewed and are negative.    Objective: There were no vitals taken for this visit. Physical Exam Vitals and nursing note reviewed.  Constitutional:      General: She is not in acute distress.    Appearance: Normal appearance. She is well-developed. She is not ill-appearing, toxic-appearing or diaphoretic.  HENT:     Head: Normocephalic and atraumatic.     Nose: No congestion or rhinorrhea.  Eyes:     General: No scleral icterus. Cardiovascular:     Rate and Rhythm: Normal rate and regular rhythm.     Heart sounds: Normal heart sounds.  Pulmonary:     Effort: Pulmonary effort is normal. No respiratory distress.     Breath sounds: Normal breath sounds.  Abdominal:     General: Bowel sounds are normal.     Palpations: Abdomen is soft. There is no hepatomegaly, splenomegaly or mass.     Tenderness: There is no abdominal tenderness. There is no guarding or rebound.     Hernia: No hernia is present.  Skin:    General: Skin is warm and dry.     Coloration: Skin is not jaundiced.     Findings: No rash.  Neurological:     General: No focal deficit present.     Mental Status: She is alert and oriented to person, place, and time.  Psychiatric:         Attention and Perception: Attention normal.        Mood and Affect: Mood normal.        Speech: Speech normal.        Behavior: Behavior normal.        Thought Content: Thought content normal.        Cognition and Memory: Cognition and memory normal.       03/13/2020 7:21 AM   Disclaimer: This note was dictated with voice recognition software. Similar sounding words can inadvertently be transcribed and may not be corrected upon review.

## 2020-03-14 ENCOUNTER — Telehealth: Payer: Self-pay | Admitting: Internal Medicine

## 2020-03-14 DIAGNOSIS — H02055 Trichiasis without entropian left lower eyelid: Secondary | ICD-10-CM | POA: Diagnosis not present

## 2020-03-14 NOTE — Telephone Encounter (Signed)
Ok to continue flecainide 50 mg.

## 2020-03-14 NOTE — Telephone Encounter (Signed)
Patient calling back. She states if it's okay with Dr. Elberta Fortis she will stay on the heart medications she is on now until she can get with her GI doctor. She states she wants to see if the chest pain is from acid reflux and does not want to mess up her heart medications. She states she called her GI doctors office and they will be calling her back. She states she will call back when she hears from them.

## 2020-03-14 NOTE — Telephone Encounter (Signed)
[phone]  Please call patient, she has a medication question

## 2020-03-14 NOTE — Telephone Encounter (Signed)
Called Hailey [name] to instruct her to continue Flecainide 50 mg as ordered by Dr Elberta Fortis.  Hailey [name] states understanding but reports since decreasing her dose to 50 mg she has had more fluttering in her chest.  She notices it more at night or when she is laying down watching TV etc.  She does not notice it when up and moving around.  She also reports having more chest pain the last several days.  She reports it is a dull, aching pain in the center of her chest, between her breast-then feels a nudge and her heart flutters.  She denies any increase in SOB or any other s/s r/t this stating "I feel fine other than that".  HR today 73, BP 114/71.  She reports also having GERD for which she takes omeprazole 40 mg BID as well as TUMS prn.  She was to have seen her GI MD yesterday but missed her appt.  She will c/b to her GI for further eval r/t this.  Hailey [name] is aware I will forward this to Dr Elberta Fortis for his knowledge and review.

## 2020-03-15 ENCOUNTER — Other Ambulatory Visit: Payer: Self-pay | Admitting: Cardiology

## 2020-03-15 MED ORDER — FLECAINIDE ACETATE 50 MG PO TABS
50.0000 mg | ORAL_TABLET | Freq: Two times a day (BID) | ORAL | 3 refills | Status: DC
Start: 2020-03-15 — End: 2021-02-26

## 2020-03-15 NOTE — Telephone Encounter (Signed)
Outreach made to Pt.  Pt is taking flecainide 50 mg BID PO.  She filled the flecainide 100 mg PO BID.  Per pharmacist and this nurse-ok to split in half and take 1/2 tablet bid until they are gone.  Pt indicates understanding.

## 2020-03-15 NOTE — Telephone Encounter (Signed)
Patient is calling to follow up regarding medications. She states she would like to update the triage nurse who she spoke with yesterday. She states last night she did not experience any pain or fluttering in her chest. She states if it's alright with Dr. Elberta Fortis she does not want to have her medication adjusted again. Patient would like a call back to further discuss.

## 2020-03-15 NOTE — Telephone Encounter (Signed)
Spoke with pt. Pt asked if her heart medication could cause heart burn. Pt was advised to speak with her local pharmacy or cardiologist about the heart medications side effects.

## 2020-03-15 NOTE — Telephone Encounter (Signed)
*  STAT* If patient is at the pharmacy, call can be transferred to refill team.   1. Which medications need to be refilled? (please list name of each medication and dose if known) flecainide (TAMBOCOR) 50 MG tablet  2. Which pharmacy/location (including street and city if local pharmacy) is medication to be sent to?  [id number] - [place], [place] - [address]. Do they need a 30 day or 90 day supply? 90 day supply

## 2020-03-15 NOTE — Telephone Encounter (Signed)
Spoke with Pt.  Per Pt she is doing well on the flecainide 50 mg PO BID.  She filled flecainide 100 tablets-she will take 1/2 tablet PO BID until that prescription is gone.  Corrected flecainide on med list.

## 2020-03-15 NOTE — Telephone Encounter (Signed)
Per phone note on 03/12/20 Sharin Grave, RN     2:26 PM Note Called pt to instruct her to continue Flecainide 50 mg as ordered by Dr Elberta Fortis.  Pt states understanding but reports since decreasing her dose to 50 mg she has had more fluttering in her chest.  She notices it more at night or when she is laying down watching TV etc.  She does not notice it when up and moving around.  She also reports having more chest pain the last several days.  She reports it is a dull, aching pain in the center of her chest, between her breast-then feels a nudge and her heart flutters.  She denies any increase in SOB or any other s/s r/t this stating "I feel fine other than that".  HR today 73, BP 114/71.  She reports also having GERD for which she takes omeprazole 40 mg BID as well as TUMS prn.  She was to have seen her GI MD yesterday but missed her appt.  She will c/b to her GI for further eval r/t this.  Pt is aware I will forward this to Dr Elberta Fortis for his knowledge and review.      Regan Lemming, MD to Wiliam Ke, RN     12:03 PM Note Ok to continue flecainide 50 mg.     This was changed on pt's medication list. Pt is requesting a refill on flecainide 50 mg tablet. Please address

## 2020-03-19 ENCOUNTER — Ambulatory Visit: Payer: Medicare PPO

## 2020-03-20 ENCOUNTER — Emergency Department (HOSPITAL_COMMUNITY)
Admission: EM | Admit: 2020-03-20 | Discharge: 2020-03-21 | Disposition: A | Discharge status: Home / Self Care | Payer: Medicare PPO | Attending: Emergency Medicine | Admitting: Emergency Medicine

## 2020-03-20 ENCOUNTER — Encounter (HOSPITAL_COMMUNITY): Payer: Self-pay | Admitting: *Deleted

## 2020-03-20 ENCOUNTER — Other Ambulatory Visit: Payer: Self-pay

## 2020-03-20 ENCOUNTER — Emergency Department (HOSPITAL_COMMUNITY): Payer: Medicare PPO

## 2020-03-20 DIAGNOSIS — R0789 Other chest pain: Secondary | ICD-10-CM

## 2020-03-20 DIAGNOSIS — R072 Precordial pain: Secondary | ICD-10-CM | POA: Diagnosis not present

## 2020-03-20 DIAGNOSIS — R079 Chest pain, unspecified: Secondary | ICD-10-CM | POA: Diagnosis not present

## 2020-03-20 DIAGNOSIS — I2510 Atherosclerotic heart disease of native coronary artery without angina pectoris: Secondary | ICD-10-CM | POA: Diagnosis not present

## 2020-03-20 DIAGNOSIS — Z7901 Long term (current) use of anticoagulants: Secondary | ICD-10-CM | POA: Diagnosis not present

## 2020-03-20 DIAGNOSIS — R1013 Epigastric pain: Secondary | ICD-10-CM | POA: Insufficient documentation

## 2020-03-20 LAB — CBC
HCT: 42.4 % (ref 36.0–46.0)
Hemoglobin: 13.6 g/dL (ref 12.0–15.0)
MCH: 33.3 pg (ref 26.0–34.0)
MCHC: 32.1 g/dL (ref 30.0–36.0)
MCV: 103.7 fL — ABNORMAL HIGH (ref 80.0–100.0)
Platelets: 275 10*3/uL (ref 150–400)
RBC: 4.09 MIL/uL (ref 3.87–5.11)
RDW: 13.9 % (ref 11.5–15.5)
WBC: 7.3 10*3/uL (ref 4.0–10.5)
nRBC: 0 % (ref 0.0–0.2)

## 2020-03-20 LAB — BASIC METABOLIC PANEL
Anion gap: 9 (ref 5–15)
BUN: 14 mg/dL (ref 8–23)
CO2: 27 mmol/L (ref 22–32)
Calcium: 9.3 mg/dL (ref 8.9–10.3)
Chloride: 104 mmol/L (ref 98–111)
Creatinine, Ser: 1.28 mg/dL — ABNORMAL HIGH (ref 0.44–1.00)
GFR calc Af Amer: 46 mL/min — ABNORMAL LOW (ref >60)
GFR calc non Af Amer: 40 mL/min — ABNORMAL LOW (ref >60)
Glucose, Bld: 98 mg/dL (ref 70–99)
Potassium: 4 mmol/L (ref 3.5–5.1)
Sodium: 140 mmol/L (ref 135–145)

## 2020-03-20 LAB — TROPONIN I (HIGH SENSITIVITY)
Troponin I (High Sensitivity): 8 ng/L (ref <18)
Troponin I (High Sensitivity): 8 ng/L (ref <18)

## 2020-03-20 NOTE — ED Triage Notes (Signed)
Pt states couple months ago had some gastro problems and said it was reflux.  Referred to gastroenterologist. Pt only takes omeprazole once a day. Pt followed up with cardiologist and she states she was feeling flutter and increased flecainide. Burning continued, then face and mask started swelling for 2 days. Then dropped the dosage of flecainide and the swelling went away.  Burning still there

## 2020-03-21 MED ORDER — LIDOCAINE VISCOUS HCL 2 % MT SOLN
15.0000 mL | Freq: Four times a day (QID) | OROMUCOSAL | 0 refills | Status: DC | PRN
Start: 2020-03-21 — End: 2020-04-17

## 2020-03-21 MED ORDER — ALUM & MAG HYDROXIDE-SIMETH 200-200-20 MG/5ML PO SUSP
30.0000 mL | Freq: Once | ORAL | Status: AC
  Administered 2020-03-21: 30 mL via ORAL
  Filled 2020-03-21: qty 30

## 2020-03-21 MED ORDER — ALUM & MAG HYDROXIDE-SIMETH 400-400-40 MG/5ML PO SUSP: 15.0000 mL | Freq: Four times a day (QID) | ORAL | 0 refills | Status: DC | PRN

## 2020-03-21 MED ORDER — LIDOCAINE VISCOUS HCL 2 % MT SOLN
15.0000 mL | Freq: Once | OROMUCOSAL | Status: AC
  Administered 2020-03-21: 15 mL via ORAL
  Filled 2020-03-21: qty 15

## 2020-03-21 NOTE — ED Provider Notes (Addendum)
[facility] EMERGENCY DEPARTMENT Provider Note  CSN: [id number] Arrival date & time: 03/20/20 1034  Chief Complaint(s) No chief complaint on file.  HPI Hailey [name] is a 79 y.o. female   The history is provided by the patient.  Chest Pain Pain location:  Substernal area Pain quality: burning   Pain radiates to:  Precordial region and epigastrium Pain severity now: mild to moderate. Onset quality:  Gradual Duration: 4-5 weeks. Timing:  Constant Progression:  Waxing and waning Chronicity:  Recurrent Context: eating   Relieved by:  Antacids Exacerbated by: eating. Associated symptoms: abdominal pain (epigastrum, intermittent. resolved) and heartburn   Associated symptoms: no anorexia, no anxiety, no cough, no diaphoresis, no fatigue, no shortness of breath and no vomiting     Past Medical History Past Medical History:  Diagnosis Date  . Atrial fibrillation (HCC)   . Breast nodule 11/16/2013  . H/O hematuria   . HLD (hyperlipidemia)   . Hypertension   . 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 Migraine headache   . Palpitations   . Restless leg syndrome   . Vaginal atrophy    Patient Active Problem List   Diagnosis Date Noted  . GERD (gastroesophageal reflux disease) 11/29/2019  . Abdominal pain 11/29/2019  . Acquired thrombophilia (HCC) 06/22/2019  . Persistent atrial fibrillation (HCC) 05/11/2019  . Atrial flutter (HCC)   . Atrial fibrillation with RVR (HCC) 05/01/2019  . Essential hypertension   . Atrial fibrillation (HCC) 07/14/2018  . Vaginal dryness 01/28/2017  . Vaginal atrophy 01/28/2017  . Breast nodule 11/16/2013  . Breast mass, right 12/22/2012  . OVERWEIGHT 04/13/2009  . GENERALIZED ANXIETY DISORDER 04/13/2009  . Coronary artery disease involving native heart without angina pectoris 04/13/2009  . PALPITATIONS 04/03/2009   Home Medication(s) Prior to Admission medications   Medication Sig Start Date End Date Taking?  Authorizing Provider  acetaminophen (TYLENOL) 650 MG CR tablet Take 650 mg by mouth 2 (two) times daily.     [provider]  alum & mag hydroxide-simeth (MAALOX ADVANCED MAX ST) 400-400-40 MG/5ML suspension Take 15 mLs by mouth every 6 (six) hours as needed for indigestion. 03/21/20   Prisca Gearing, Amadeo Garnet, MD  apixaban (ELIQUIS) 5 MG TABS tablet Take 5 mg by mouth 2 (two) times daily.    [provider]  Calcium Carb-Cholecalciferol (CALCIUM 600+D3 PO) Take 1 tablet by mouth daily.     [provider]  calcium carbonate (TUMS - DOSED IN MG ELEMENTAL CALCIUM) 500 MG chewable tablet Chew 1 tablet by mouth as needed for indigestion or heartburn.    [provider]  diltiazem (CARDIZEM CD) 180 MG 24 hr capsule Take 1 capsule (180 mg total) by mouth daily. 02/09/20   Camnitz, Andree Coss, MD  flecainide (TAMBOCOR) 50 MG tablet Take 1 tablet (50 mg total) by mouth 2 (two) times daily. 03/15/20   Camnitz, Will Daphine Deutscher, MD  fluticasone (FLONASE) 50 MCG/ACT nasal spray Place 2 sprays into both nostrils daily as needed for allergies or rhinitis.    [provider]  gabapentin (NEURONTIN) 100 MG capsule Take 100 mg by mouth at bedtime.    [provider]  lidocaine (XYLOCAINE) 2 % solution Use as directed 15 mLs in the mouth or throat every 6 (six) hours as needed for mouth pain. 03/21/20   Zuleika Gallus, Amadeo Garnet, MD  metroNIDAZOLE (METROCREAM) 0.75 % cream Apply 1 application topically 2 (two) times daily as needed (rosacea).  04/27/19  [provider]  Multiple Vitamin (MULTIVITAMIN WITH MINERALS) TABS tablet Take 1 tablet by mouth daily. Centrum Silver for Women     [provider]  omeprazole (PRILOSEC) 40 MG capsule Take 1 capsule (40 mg total) by mouth in the morning and at bedtime. 02/08/20   Anice Paganini, NP                                                                                                                                      Past Surgical History Past Surgical History:  Procedure Laterality Date  . APPENDECTOMY    . ATRIAL FIBRILLATION ABLATION N/A 08/20/2018   Procedure: ATRIAL FIBRILLATION ABLATION;  Surgeon: Regan Lemming, MD;  Location: [facility] INVASIVE CV LAB;  Service: Cardiovascular;  Laterality: N/A;  . ATRIAL FIBRILLATION ABLATION N/A 05/11/2019   Procedure: ATRIAL FIBRILLATION ABLATION;  Surgeon: Regan Lemming, MD;  Location: [facility] INVASIVE CV LAB;  Service: Cardiovascular;  Laterality: N/A;  . CARDIAC CATHETERIZATION  2005    cone  . CARDIOVERSION N/A 05/02/2019   Procedure: CARDIOVERSION;  Surgeon: Jodelle Red, MD;  Location: [facility] ENDOSCOPY;  Service: Cardiovascular;  Laterality: N/A;  . COLONOSCOPY    . HERNIA REPAIR    . LEFT HEART CATHETERIZATION WITH CORONARY ANGIOGRAM N/A 04/12/2012   Procedure: LEFT HEART CATHETERIZATION WITH CORONARY ANGIOGRAM;  Surgeon: Kathleene Hazel, MD;  Location: [facility] CATH LAB;  Service: Cardiovascular;  Laterality: N/A;  . TOTAL ABDOMINAL HYSTERECTOMY     Family History Family History  Problem Relation Age of Onset  . Coronary artery disease Father        mid 41s  . Hypertension Mother   . Stroke Brother   . Transient ischemic attack Brother   . Other Brother        on oxygen  . Hypertension Brother   . Stroke Brother   . Heart disease Brother   . Heart attack Brother   . Other Sister        a fib, restless leg  . Other Sister        pressure in eyes    Social History Social History   Tobacco Use  . Smoking status: Never Smoker  . Smokeless tobacco: Never Used  Vaping Use  . Vaping Use: Never used  Substance Use Topics  . Alcohol use: Yes    Comment: rarely; glass of wine with dinner  . Drug use: No   Allergies Amiodarone and Flomax [tamsulosin hcl]  Review of Systems Review of Systems  Constitutional: Negative for diaphoresis and fatigue.  Respiratory: Negative for cough and shortness of breath.   Cardiovascular:  Positive for chest pain.  Gastrointestinal: Positive for abdominal pain (epigastrum, intermittent. resolved) and heartburn. Negative for anorexia and vomiting.   All other systems are reviewed and are negative for acute change except as noted in the HPI  Physical Exam Vital Signs  I have reviewed  the triage vital signs BP 105/62 (BP Location: Left Arm)   Pulse 60   Temp 97.6 F (36.4 C) (Oral)   Resp 16   Ht 5\' 7"  (1.702 m)   Wt 87.5 kg   SpO2 94%   BMI 30.23 kg/m   Physical Exam Vitals reviewed.  Constitutional:      General: She is not in acute distress.    Appearance: She is well-developed. She is not diaphoretic.  HENT:     Head: Normocephalic and atraumatic.     Nose: Nose normal.  Eyes:     General: No scleral icterus.       Right eye: No discharge.        Left eye: No discharge.     Conjunctiva/sclera: Conjunctivae normal.     Pupils: Pupils are equal, round, and reactive to light.  Cardiovascular:     Rate and Rhythm: Normal rate and regular rhythm.     Heart sounds: No murmur heard.  No friction rub. No gallop.   Pulmonary:     Effort: Pulmonary effort is normal. No respiratory distress.     Breath sounds: Normal breath sounds. No stridor. No rales.  Abdominal:     General: There is no distension.     Palpations: Abdomen is soft.     Tenderness: There is no abdominal tenderness.  Musculoskeletal:        General: No tenderness.     Cervical back: Normal range of motion and neck supple.     Right lower leg: 1+ Pitting Edema present.     Left lower leg: 1+ Pitting Edema present.  Skin:    General: Skin is warm and dry.     Findings: No erythema or rash.  Neurological:     Mental Status: She is alert and oriented to person, place, and time.     ED Results and Treatments Labs (all labs ordered are listed, but only abnormal results are displayed) Labs Reviewed  BASIC METABOLIC PANEL - Abnormal; Notable for the following components:      Result Value    Creatinine, Ser 1.28 (*)    GFR calc non Af Amer 40 (*)    GFR calc Af Amer 46 (*)    All other components within normal limits  CBC - Abnormal; Notable for the following components:   MCV 103.7 (*)    All other components within normal limits  TROPONIN I (HIGH SENSITIVITY)  TROPONIN I (HIGH SENSITIVITY)                                                                                                                         EKG      Radiology DG Chest 2 View  Result Date: 03/20/2020 CLINICAL DATA:  Chest pain EXAM: CHEST - 2 VIEW COMPARISON:  04/29/2019 FINDINGS: Artifact from EKG leads. Normal heart size and stable mediastinal contours. There is no edema, consolidation, effusion, or pneumothorax. No acute osseous finding. IMPRESSION: No active  cardiopulmonary disease. Electronically Signed   By: Marnee SpringJonathon  Watts M.D.   On: 03/20/2020 11:23    Pertinent labs & imaging results that were available during my care of the patient were reviewed by me and considered in my medical decision making (see chart for details).  Medications Ordered in ED Medications  alum & mag hydroxide-simeth (MAALOX/MYLANTA) 200-200-20 MG/5ML suspension 30 mL (has no administration in time range)    And  lidocaine (XYLOCAINE) 2 % viscous mouth solution 15 mL (has no administration in time range)                                                                                                                                    Procedures Procedures  (including critical care time)  Medical Decision Making / ED Course I have reviewed the nursing notes for this encounter and the patient's prior records (if available in EHR or on provided paperwork).   Hailey [name] was evaluated in Emergency Department on 03/21/2020 for the symptoms described in the history of present illness. She was evaluated in the context of the global COVID-19 pandemic, which necessitated consideration that the patient might be at risk for  infection with the SARS-CoV-2 virus that causes COVID-19. Institutional protocols and algorithms that pertain to the evaluation of patients at risk for COVID-19 are in a state of rapid change based on information released by regulatory bodies including the CDC and federal and state organizations. These policies and algorithms were followed during the patient's care in the ED.  Patient presents with burning sensation from the epigastrium up to the chest.  It has been persistent for 4 to 5 weeks.  History of GERD.  Pain is nonexertional.  Worse with food intake.  Highly atypical for a CVS and not concerning for cardiac etiology.  However given patient's age and comorbidities, cardiac work-up was obtained which revealed nonischemic EKG and negative serial troponins x2.  Labs without leukocytosis or anemia.  No significant electrolyte derangements.  Improved renal function.  Patient's abdomen was benign on exam.  Low suspicion for serious intra-abdominal inflammatory/infectious process or bowel obstruction requiring additional labs or imaging at this time.  Symptoms were controlled with GI cocktail.  Patient already has established care with a PCP and gastroenterology with upcoming appointments.      Final Clinical Impression(s) / ED Diagnoses Final diagnoses:  Burning chest pain   The patient appears reasonably screened and/or stabilized for discharge and I doubt any other medical condition or other [facility] requiring further screening, evaluation, or treatment in the ED at this time prior to discharge. Safe for discharge with strict return precautions.  Disposition: Discharge  Condition: Good  I have discussed the results, Dx and Tx plan with the patient/family who expressed understanding and agree(s) with the plan. Discharge instructions discussed at length. The patient/family was given strict return precautions who verbalized understanding of the instructions. No further questions at  time of  discharge.    ED Discharge Orders         Ordered    alum & mag hydroxide-simeth (MAALOX ADVANCED MAX ST) 400-400-40 MG/5ML suspension  Every 6 hours PRN     Discontinue  Reprint     03/21/20 0049    lidocaine (XYLOCAINE) 2 % solution  Every 6 hours PRN     Discontinue  Reprint     03/21/20 0049         Follow Up: Richardean Chimera, MD [address] [place] [address] [phone]  Schedule an appointment as soon as possible for a visit  As needed  Gastroenterlogy   as scheduled      This chart was dictated using voice recognition software.  Despite best efforts to proofread,  errors can occur which can change the documentation meaning.     Nira Conn, MD 03/21/20 [phone]

## 2020-04-17 ENCOUNTER — Other Ambulatory Visit: Payer: Self-pay

## 2020-04-17 ENCOUNTER — Ambulatory Visit: Payer: Medicare PPO | Admitting: Nurse Practitioner

## 2020-04-17 ENCOUNTER — Encounter: Payer: Self-pay | Admitting: Nurse Practitioner

## 2020-04-17 ENCOUNTER — Encounter: Payer: Self-pay | Admitting: Internal Medicine

## 2020-04-17 ENCOUNTER — Telehealth: Payer: Self-pay

## 2020-04-17 VITALS — BP 139/79 | HR 52 | Temp 97.6°F | Ht 67.0 in | Wt 193.8 lb

## 2020-04-17 DIAGNOSIS — R1310 Dysphagia, unspecified: Secondary | ICD-10-CM | POA: Diagnosis not present

## 2020-04-17 DIAGNOSIS — R1013 Epigastric pain: Secondary | ICD-10-CM | POA: Diagnosis not present

## 2020-04-17 DIAGNOSIS — K219 Gastro-esophageal reflux disease without esophagitis: Secondary | ICD-10-CM

## 2020-04-17 DIAGNOSIS — R1319 Other dysphagia: Secondary | ICD-10-CM

## 2020-04-17 NOTE — Telephone Encounter (Signed)
LMOVM for pt to call back to schedule EGD +/-dil with Dr. Jena Gauss

## 2020-04-17 NOTE — Telephone Encounter (Signed)
Noted. Let me know if anything else needed for scheduling

## 2020-04-17 NOTE — Telephone Encounter (Signed)
Noted, routing to Wynne Dust, NP

## 2020-04-17 NOTE — Telephone Encounter (Signed)
Ok to hold, restart as soon as possible.

## 2020-04-17 NOTE — Patient Instructions (Addendum)
Your health issues we discussed today were:   GERD (reflux/heartburn) with dysphagia (swallowing difficulties): 1. Continue taking Prilosec 20 mg twice a day for now 2. We will schedule an upper endoscopy to evaluate your stomach and swallowing. 3. We will also plan to do a possible dilation to help with your swallowing difficulties 4. Further recommendations will follow your procedure 5. Call us if you have any worsening or severe symptoms  Overall I recommend:  1. Continue your other current medications 2. Return for follow-up in 3 months 3. Call us if you have any questions or concerns   ---------------------------------------------------------------  I am glad you have gotten your COVID-19 vaccination!  Even though you are fully vaccinated you should continue to follow CDC and state/local guidelines.  ---------------------------------------------------------------   At [facility]astroenterology we value your feedback. You may receive a survey about your visit today. Please share your experience as we strive to create trusting relationships with our patients to provide genuine, compassionate, quality care.  We appreciate your understanding and patience as we review any laboratory studies, imaging, and other diagnostic tests that are ordered as we care for you. Our office policy is 5 business days for review of these results, and any emergent or urgent results are addressed in a timely manner for your best interest. If you do not hear from our office in 1 week, please contact us.   We also encourage the use of MyChart, which contains your medical information for your review as well. If you are not enrolled in this feature, an access code is on this after visit summary for your convenience. Thank you for allowing [place] to be involved in your care.  It was great to see you today!  I hope you have a great rest of your summer!!

## 2020-04-17 NOTE — Progress Notes (Signed)
Referring Provider: Richardean Chimera, MD Primary Care Physician:  Richardean Chimera, MD Primary GI:  Dr. Jena Gauss  Chief Complaint  Patient presents with  . Gastroesophageal Reflux    c/o some pain in chest and worse at night when lying on side  . Dysphagia    patient reports sometime feels like food getting stuck in chest or moving slow going down    HPI:   Hailey [name] is a 79 y.o. female who presents for follow-up on GERD. The patient was last seen in our office 01/10/2020 for GERD and epigastric pain. Previous chest pain in the emergency room with negative cardiac work-up and improvement with GI cocktail/Carafate deemed likely upper GI and etiology.  Noted longstanding chronic history of GERD no symptoms in years, sudden onset of which omeprazole helped.  At her last visit noted tried Carafate again based on recommendations but had side effects and had to stop and subsequent return of symptoms and flares.  Does not want surgery and is hoping to get a better response from medication.  No identified triggers.  No other overt GI complaints.  Has been on omeprazole and pantoprazole, has not tried dexlansoprazole, esomeprazole, lansoprazole, and rabeprazole.  Recommended continue omeprazole 20 mg twice a day, call in 2 weeks to let us know for any worsening, keep appointment in August as scheduled.  Recent ED evaluation 03/20/2020 for burning chest pain deemed likely due to GERD. Symptoms resolved after GI cocktail.  Cardiac evaluation negative.  Today she states she is doing okay overall. She had significant side effects with increased omeprazole to 40 mg bid. Had similar issues with increased flecainide. When she decreased her PPI to 20 mg bid, she is having persistent symptoms including esophageal burning. Overall not as bad. Worse in the morning after laying flat. When she eats, about 2 hours last, had lower abdominal discomfort which improves with a bowel movement. She thinks Dexilant worked  ok, but overall omeprazole is "ok" but not great. Having occasional solid food dysphagia worse with breads and meats; has associated odynophagia. Has some nausea but no vomiting or regurgitation. Doesn't look at her stools. Denies fever, chills, unintentional weight loss. Denies URI or flu-like symptoms. Denies loss of sense of taste or smell. The patient has received COVID-19 vaccination(s). Denies chest pain, dyspnea, dizziness, lightheadedness, syncope, near syncope. Denies any other upper or lower GI symptoms.  Past Medical History:  Diagnosis Date  . Atrial fibrillation (HCC)   . Breast nodule 11/16/2013  . H/O hematuria   . HLD (hyperlipidemia)   . Hypertension   . 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 Migraine headache   . Palpitations   . Restless leg syndrome   . Vaginal atrophy     Past Surgical History:  Procedure Laterality Date  . APPENDECTOMY    . ATRIAL FIBRILLATION ABLATION N/A 08/20/2018   Procedure: ATRIAL FIBRILLATION ABLATION;  Surgeon: Regan Lemming, MD;  Location: [facility];  Service: Cardiovascular;  Laterality: N/A;  . ATRIAL FIBRILLATION ABLATION N/A 05/11/2019   Procedure: ATRIAL FIBRILLATION ABLATION;  Surgeon: Regan Lemming, MD;  Location: [facility];  Service: Cardiovascular;  Laterality: N/A;  . CARDIAC CATHETERIZATION  2005    cone  . CARDIOVERSION N/A 05/02/2019   Procedure: CARDIOVERSION;  Surgeon: Jodelle Red, MD;  Location: [facility] ENDOSCOPY;  Service: Cardiovascular;  Laterality: N/A;  . COLONOSCOPY    . HERNIA REPAIR    . LEFT  HEART CATHETERIZATION WITH CORONARY ANGIOGRAM N/A 04/12/2012   Procedure: LEFT HEART CATHETERIZATION WITH CORONARY ANGIOGRAM;  Surgeon: Kathleene Hazel, MD;  Location: [facility] LAB;  Service: Cardiovascular;  Laterality: N/A;  . TOTAL ABDOMINAL HYSTERECTOMY      Current Outpatient Medications  Medication Sig Dispense Refill  . acetaminophen (TYLENOL) 650 MG CR tablet Take 650 mg  by mouth 2 (two) times daily.     Marland Kitchen apixaban (ELIQUIS) 5 MG TABS tablet Take 5 mg by mouth 2 (two) times daily.    . Calcium Carb-Cholecalciferol (CALCIUM 600+D3 PO) Take 1 tablet by mouth daily.     . calcium carbonate (TUMS - DOSED IN MG ELEMENTAL CALCIUM) 500 MG chewable tablet Chew 1 tablet by mouth as needed for indigestion or heartburn.    . diltiazem (CARDIZEM CD) 180 MG 24 hr capsule Take 1 capsule (180 mg total) by mouth daily. 30 capsule 5  . flecainide (TAMBOCOR) 50 MG tablet Take 1 tablet (50 mg total) by mouth 2 (two) times daily. 180 tablet 3  . fluticasone (FLONASE) 50 MCG/ACT nasal spray Place 2 sprays into both nostrils daily as needed for allergies or rhinitis.    Marland Kitchen gabapentin (NEURONTIN) 100 MG capsule Take 100 mg by mouth at bedtime.    . metroNIDAZOLE (METROCREAM) 0.75 % cream Apply 1 application topically 2 (two) times daily as needed (rosacea).     . Multiple Vitamin (MULTIVITAMIN WITH MINERALS) TABS tablet Take 1 tablet by mouth daily. Centrum Silver for Women     . omeprazole (PRILOSEC) 40 MG capsule Take 1 capsule (40 mg total) by mouth in the morning and at bedtime. 60 capsule 3   No current facility-administered medications for this visit.    Allergies as of 04/17/2020 - Review Complete 04/17/2020  Allergen Reaction Noted  . Amiodarone Itching 04/24/2018  . Flomax [tamsulosin hcl] Nausea And Vomiting 01/24/2015    Family History  Problem Relation Age of Onset  . Coronary artery disease Father        mid 54s  . Hypertension Mother   . Stroke Brother   . Transient ischemic attack Brother   . Other Brother        on oxygen  . Hypertension Brother   . Stroke Brother   . Heart disease Brother   . Heart attack Brother   . Other Sister        a fib, restless leg  . Other Sister        pressure in eyes  . Colon cancer Neg Hx   . Gastric cancer Neg Hx   . Esophageal cancer Neg Hx     Social History   Socioeconomic History  . Marital status: Single     Spouse name: Not on file  . Number of children: 0  . Years of education: College  . Highest education level: Not on file  Occupational History  . Occupation: retired  Tobacco Use  . Smoking status: Never Smoker  . Smokeless tobacco: Never Used  Vaping Use  . Vaping Use: Never used  Substance and Sexual Activity  . Alcohol use: Yes    Comment: rarely; glass of wine with dinner  . Drug use: No  . Sexual activity: Never    Birth control/protection: Surgical    Comment: hyst  Other Topics Concern  . Not on file  Social History Narrative   Single. Retired.    Patient lives at home alone.   Caffeine Use: none   Social Determinants  of Health   Financial Resource Strain:   . Difficulty of Paying Living Expenses: Not on file  Food Insecurity:   . Worried About Programme researcher, broadcasting/film/video in the Last Year: Not on file  . Ran Out of Food in the Last Year: Not on file  Transportation Needs:   . Lack of Transportation (Medical): Not on file  . Lack of Transportation (Non-Medical): Not on file  Physical Activity:   . Days of Exercise per Week: Not on file  . Minutes of Exercise per Session: Not on file  Stress:   . Feeling of Stress : Not on file  Social Connections:   . Frequency of Communication with Friends and Family: Not on file  . Frequency of Social Gatherings with Friends and Family: Not on file  . Attends Religious Services: Not on file  . Active Member of Clubs or Organizations: Not on file  . Attends Banker Meetings: Not on file  . Marital Status: Not on file    Subjective: Review of Systems  Constitutional: Negative for chills, fever, malaise/fatigue and weight loss.  HENT: Negative for congestion and sore throat.   Respiratory: Negative for cough and shortness of breath.   Cardiovascular: Negative for chest pain and palpitations.  Gastrointestinal: Positive for abdominal pain and heartburn. Negative for blood in stool, diarrhea, melena, nausea and vomiting.   Musculoskeletal: Negative for joint pain and myalgias.  Skin: Negative for rash.  Neurological: Negative for dizziness and weakness.  Endo/Heme/Allergies: Does not bruise/bleed easily.  Psychiatric/Behavioral: Negative for depression. The patient is not nervous/anxious.   All other systems reviewed and are negative.    Objective: BP 139/79   Pulse (!) 52   Temp 97.6 F (36.4 C) (Oral)   Ht 5\' 7"  (1.702 m)   Wt 193 lb 12.8 oz (87.9 kg)   BMI 30.35 kg/m  Physical Exam Vitals and nursing note reviewed.  Constitutional:      General: She is not in acute distress.    Appearance: Normal appearance. She is well-developed. She is obese. She is not ill-appearing, toxic-appearing or diaphoretic.  HENT:     Head: Normocephalic and atraumatic.     Nose: No congestion or rhinorrhea.  Eyes:     General: No scleral icterus. Cardiovascular:     Rate and Rhythm: Normal rate and regular rhythm.     Heart sounds: Normal heart sounds.  Pulmonary:     Effort: Pulmonary effort is normal. No respiratory distress.     Breath sounds: Normal breath sounds.  Abdominal:     General: Bowel sounds are normal.     Palpations: Abdomen is soft. There is no hepatomegaly, splenomegaly or mass.     Tenderness: There is no abdominal tenderness. There is no guarding or rebound.     Hernia: No hernia is present.  Skin:    General: Skin is warm and dry.     Coloration: Skin is not jaundiced.     Findings: No rash.  Neurological:     General: No focal deficit present.     Mental Status: She is alert and oriented to person, place, and time.  Psychiatric:        Attention and Perception: Attention normal.        Mood and Affect: Mood normal.        Speech: Speech normal.        Behavior: Behavior normal.        Thought Content: Thought content normal.  Cognition and Memory: Cognition and memory normal.      Assessment:  Very pleasant 79 year old female with chronic history of GERD.  Previously  tried to increase her Prilosec 20 mg twice daily up to Prilosec 40 mg twice daily.  She had adverse effects with this and decreased back to 20 mg in the 80s ceased.  She had similar issues with increase in flecainide.  Currently her GERD is doing "okay" but does have some exacerbating symptoms.  She is well also developed occasional dysphagia with significant odynophagia.  She has not had an EGD since 1995.  Given her progressive worsening of symptoms we will plan for an upper endoscopy with possible dilation.  We may need to change her to Dexilant if her EGD indicates poorly controlled disease.  She is on Eliquis which will need to be held per protocol prior exam.  Overall likely exacerbated GERD as an etiology of her dysphagia symptoms.  Proceed with EGD with Dr. Jena Gaussourk in near future: the risks, benefits, and alternatives have been discussed with the patient in detail. The patient states understanding and desires to proceed.  The patient is currently on Neurontin and Eliquis. The patient is not on any other anticoagulants, anxiolytics, chronic pain medications, antidepressants, antidiabetics, or iron supplements.  We will plan to hold Eliquis for 48 hours prior to procedure, will get cardiology okay first.  Conscious sedation should likely be adequate for the procedure as it was for her last.  ASA II (AFib well controlled)   Plan: 1. EGD with possible dilation 2. Continue current medications 3. May eventually change to Dexilant daily 4. Further recommendations to follow 5. Follow-up in 3 months.    Thank you for allowing us to participate in the care of Hailey [name]  Wynne DustEric Zoria Rawlinson, DNP, AGNP-C Adult & Gerontological Nurse Practitioner [facility]   04/17/2020 11:28 AM   Disclaimer: This note was dictated with voice recognition software. Similar sounding words can inadvertently be transcribed and may not be corrected upon review.

## 2020-04-17 NOTE — Telephone Encounter (Signed)
Dr. Elberta Fortis, Pt was seen in office today by Wynne Dust, PA. Is it ok for pt to hold Eliquis 48 hours prior to having an upper endoscopy? Please advise.

## 2020-04-18 NOTE — Telephone Encounter (Signed)
Patient returned call. She has been scheduled for 11/3 at 7:30am. Instructions with covid test will be mailed. Confirmed address.

## 2020-04-18 NOTE — Progress Notes (Signed)
Cc'ed to pcp °

## 2020-04-19 ENCOUNTER — Telehealth: Payer: Self-pay | Admitting: *Deleted

## 2020-04-19 NOTE — Telephone Encounter (Signed)
PA approved via [employer]. Auth# [id number] Dates 06/06/2020-07/06/2020

## 2020-04-24 DIAGNOSIS — E782 Mixed hyperlipidemia: Secondary | ICD-10-CM | POA: Diagnosis not present

## 2020-04-24 DIAGNOSIS — R5382 Chronic fatigue, unspecified: Secondary | ICD-10-CM | POA: Diagnosis not present

## 2020-04-24 DIAGNOSIS — I10 Essential (primary) hypertension: Secondary | ICD-10-CM | POA: Diagnosis not present

## 2020-04-24 DIAGNOSIS — E041 Nontoxic single thyroid nodule: Secondary | ICD-10-CM | POA: Diagnosis not present

## 2020-04-24 DIAGNOSIS — I482 Chronic atrial fibrillation, unspecified: Secondary | ICD-10-CM | POA: Diagnosis not present

## 2020-04-24 DIAGNOSIS — K2100 Gastro-esophageal reflux disease with esophagitis, without bleeding: Secondary | ICD-10-CM | POA: Diagnosis not present

## 2020-04-24 DIAGNOSIS — N183 Chronic kidney disease, stage 3 unspecified: Secondary | ICD-10-CM | POA: Diagnosis not present

## 2020-04-26 DIAGNOSIS — E042 Nontoxic multinodular goiter: Secondary | ICD-10-CM | POA: Diagnosis not present

## 2020-04-26 DIAGNOSIS — E782 Mixed hyperlipidemia: Secondary | ICD-10-CM | POA: Diagnosis not present

## 2020-04-26 DIAGNOSIS — I10 Essential (primary) hypertension: Secondary | ICD-10-CM | POA: Diagnosis not present

## 2020-04-26 DIAGNOSIS — J309 Allergic rhinitis, unspecified: Secondary | ICD-10-CM | POA: Diagnosis not present

## 2020-04-26 DIAGNOSIS — G2581 Restless legs syndrome: Secondary | ICD-10-CM | POA: Diagnosis not present

## 2020-04-26 DIAGNOSIS — H02402 Unspecified ptosis of left eyelid: Secondary | ICD-10-CM | POA: Diagnosis not present

## 2020-04-26 DIAGNOSIS — Z23 Encounter for immunization: Secondary | ICD-10-CM | POA: Diagnosis not present

## 2020-04-26 DIAGNOSIS — I340 Nonrheumatic mitral (valve) insufficiency: Secondary | ICD-10-CM | POA: Diagnosis not present

## 2020-04-26 DIAGNOSIS — N1832 Chronic kidney disease, stage 3b: Secondary | ICD-10-CM | POA: Diagnosis not present

## 2020-04-26 DIAGNOSIS — Z6829 Body mass index (BMI) 29.0-29.9, adult: Secondary | ICD-10-CM | POA: Diagnosis not present

## 2020-04-26 DIAGNOSIS — Z9189 Other specified personal risk factors, not elsewhere classified: Secondary | ICD-10-CM | POA: Diagnosis not present

## 2020-04-26 DIAGNOSIS — K219 Gastro-esophageal reflux disease without esophagitis: Secondary | ICD-10-CM | POA: Diagnosis not present

## 2020-05-02 DIAGNOSIS — G7000 Myasthenia gravis without (acute) exacerbation: Secondary | ICD-10-CM | POA: Diagnosis not present

## 2020-05-17 ENCOUNTER — Other Ambulatory Visit: Payer: Self-pay | Admitting: Nurse Practitioner

## 2020-05-17 DIAGNOSIS — K219 Gastro-esophageal reflux disease without esophagitis: Secondary | ICD-10-CM

## 2020-05-17 DIAGNOSIS — R1013 Epigastric pain: Secondary | ICD-10-CM

## 2020-06-04 ENCOUNTER — Other Ambulatory Visit (HOSPITAL_COMMUNITY)
Admission: RE | Admit: 2020-06-04 | Discharge: 2020-06-04 | Disposition: A | Discharge status: Home / Self Care | Payer: Medicare PPO | Source: Ambulatory Visit | Attending: Internal Medicine | Admitting: Internal Medicine

## 2020-06-04 ENCOUNTER — Other Ambulatory Visit: Payer: Self-pay

## 2020-06-04 DIAGNOSIS — Z20822 Contact with and (suspected) exposure to covid-19: Secondary | ICD-10-CM | POA: Insufficient documentation

## 2020-06-04 DIAGNOSIS — Z01812 Encounter for preprocedural laboratory examination: Secondary | ICD-10-CM | POA: Insufficient documentation

## 2020-06-04 LAB — SARS CORONAVIRUS 2 (TAT 6-24 HRS): SARS Coronavirus 2: NEGATIVE

## 2020-06-05 ENCOUNTER — Encounter: Payer: Self-pay | Admitting: Cardiology

## 2020-06-05 ENCOUNTER — Ambulatory Visit: Payer: Medicare PPO | Admitting: Cardiology

## 2020-06-05 VITALS — BP 130/80 | HR 65 | Ht 67.0 in | Wt 196.0 lb

## 2020-06-05 DIAGNOSIS — I4819 Other persistent atrial fibrillation: Secondary | ICD-10-CM

## 2020-06-05 NOTE — Progress Notes (Signed)
Electrophysiology Office Note   Date:  06/05/2020   ID:  Hailey [name], DOB [birth year], MRN [id number]  PCP:  Richardean Chimera, MD  Cardiologist: Purvis Sheffield Primary Electrophysiologist:  Staton Markey Jorja Loa, MD    No chief complaint on file.    History of Present Illness: Hailey [name] is a 79 y.o. female who is being seen today for the evaluation of atrial fibrillation at the request of Richardean Chimera, MD. Presenting today for electrophysiology evaluation.  She has a history of atrial fibrillation.  She is status post AF ablation x2, most recently 05/11/2019.  She has had more episodes of atrial fibrillation and is on flecainide.  She does have a history of syncope due to bradycardia for which her beta-blocker was stopped.  Today, denies symptoms of palpitations, chest pain, shortness of breath, orthopnea, PND, lower extremity edema, claudication, dizziness, presyncope, syncope, bleeding, or neurologic sequela. The patient is tolerating medications without difficulties.  Since last being seen she has done well.  She is noted no further palpitations and does not feel that she is in atrial fibrillation.    Past Medical History:  Diagnosis Date  . Atrial fibrillation (HCC)   . Breast nodule 11/16/2013  . H/O hematuria   . HLD (hyperlipidemia)   . Hypertension   . 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 Migraine headache   . Palpitations   . Restless leg syndrome   . Vaginal atrophy    Past Surgical History:  Procedure Laterality Date  . APPENDECTOMY    . ATRIAL FIBRILLATION ABLATION N/A 08/20/2018   Procedure: ATRIAL FIBRILLATION ABLATION;  Surgeon: Regan Lemming, MD;  Location: [facility] INVASIVE CV LAB;  Service: Cardiovascular;  Laterality: N/A;  . ATRIAL FIBRILLATION ABLATION N/A 05/11/2019   Procedure: ATRIAL FIBRILLATION ABLATION;  Surgeon: Regan Lemming, MD;  Location: [facility] INVASIVE CV LAB;  Service: Cardiovascular;  Laterality: N/A;  . CARDIAC CATHETERIZATION   2005    cone  . CARDIOVERSION N/A 05/02/2019   Procedure: CARDIOVERSION;  Surgeon: Jodelle Red, MD;  Location: [facility] ENDOSCOPY;  Service: Cardiovascular;  Laterality: N/A;  . COLONOSCOPY    . HERNIA REPAIR    . LEFT HEART CATHETERIZATION WITH CORONARY ANGIOGRAM N/A 04/12/2012   Procedure: LEFT HEART CATHETERIZATION WITH CORONARY ANGIOGRAM;  Surgeon: Kathleene Hazel, MD;  Location[facility] CATH LAB;  Service: Cardiovascular;  Laterality: N/A;  . TOTAL ABDOMINAL HYSTERECTOMY       Current Outpatient Medications  Medication Sig Dispense Refill  . acetaminophen (TYLENOL) 650 MG CR tablet Take 1,300 mg by mouth 2 (two) times daily.     Marland Kitchen apixaban (ELIQUIS) 5 MG TABS tablet Take 5 mg by mouth 2 (two) times daily.    . Calcium Carb-Cholecalciferol (CALCIUM 600+D3 PO) Take 1 tablet by mouth daily.     . calcium carbonate (TUMS - DOSED IN MG ELEMENTAL CALCIUM) 500 MG chewable tablet Chew 1 tablet by mouth as needed for indigestion or heartburn.     . diltiazem (CARDIZEM CD) 180 MG 24 hr capsule Take 1 capsule (180 mg total) by mouth daily. 30 capsule 5  . flecainide (TAMBOCOR) 50 MG tablet Take 1 tablet (50 mg total) by mouth 2 (two) times daily. 180 tablet 3  . fluticasone (FLONASE) 50 MCG/ACT nasal spray Place 2 sprays into both nostrils daily as needed for allergies or rhinitis.     Marland Kitchen gabapentin (NEURONTIN) 100 MG capsule Take 100 mg by mouth at bedtime.    Marland Kitchen  loratadine-pseudoephedrine (CLARITIN-D 12-HOUR) 5-120 MG tablet Take 1 tablet by mouth daily as needed for allergies.    . metroNIDAZOLE (METROCREAM) 0.75 % cream Apply 1 application topically 2 (two) times daily as needed (rosacea).     . Multiple Vitamin (MULTIVITAMIN WITH MINERALS) TABS tablet Take 1 tablet by mouth daily. Centrum Silver for Women     . omeprazole (PRILOSEC) 20 MG capsule Take 20 mg by mouth 2 (two) times daily before a meal.    . Polyethyl Glycol-Propyl Glycol (SYSTANE OP) Place 1 drop into both eyes 2 (two)  times daily as needed (dry eyes).     No current facility-administered medications for this visit.    Allergies:   Amiodarone and Flomax [tamsulosin hcl]   Social History:  The patient  reports that she has never smoked. She has never used smokeless tobacco. She reports current alcohol use. She reports that she does not use drugs.   Family History:  The patient's family history includes Coronary artery disease in her father; Heart attack in her brother; Heart disease in her brother; Hypertension in her brother and mother; Other in her brother, sister, and sister; Stroke in her brother and brother; Transient ischemic attack in her brother.   ROS:  Please see the history of present illness.   Otherwise, review of systems is positive for none.   All other systems are reviewed and negative.   PHYSICAL EXAM: VS:  BP 130/80   Pulse 65   Ht 5\' 7"  (1.702 m)   Wt 196 lb (88.9 kg)   SpO2 95%   BMI 30.70 kg/m  , BMI Body mass index is 30.7 kg/m. GEN: Well nourished, well developed, in no acute distress  HEENT: normal  Neck: no JVD, carotid bruits, or masses Cardiac: RRR; no murmurs, rubs, or gallops,no edema  Respiratory:  clear to auscultation bilaterally, normal work of breathing GI: soft, nontender, nondistended, + BS MS: no deformity or atrophy  Skin: warm and dry Neuro:  Strength and sensation are intact Psych: euthymic mood, full affect  EKG:  EKG is ordered today. Personal review of the ekg ordered shows sinus rhythm, rate 65   Recent Labs: 11/03/2019: ALT 18 03/20/2020: BUN 14; Creatinine, Ser 1.28; Hemoglobin 13.6; Platelets 275; Potassium 4.0; Sodium 140    Lipid Panel  No results found for: CHOL, TRIG, HDL, CHOLHDL, VLDL, LDLCALC, LDLDIRECT   Wt Readings from Last 3 Encounters:  06/05/20 196 lb (88.9 kg)  04/17/20 193 lb 12.8 oz (87.9 kg)  03/20/20 193 lb (87.5 kg)      Other studies Reviewed: Additional studies/ records that were reviewed today include: TTE  05/20/2017 Review of the above records today demonstrates:  - Left ventricle: The cavity size was normal. Wall thickness was   normal. Systolic function was normal. The estimated ejection   fraction was in the range of 60% to 65%. Wall motion was normal;   there were no regional wall motion abnormalities. Left   ventricular diastolic function parameters were normal. - Aortic valve: Mildly to moderately calcified annulus. Trileaflet.   There was mild regurgitation. - Mitral valve: Calcified annulus. Normal thickness leaflets .   There was moderate regurgitation. - Tricuspid valve: There was mild regurgitation. - Pulmonary arteries: PA peak pressure: 34 mm Hg (S).   ASSESSMENT AND PLAN:  1.  Persistent atrial fibrillation: Currently on Eliquis and flecainide (ECG monitoring for high risk medication.  CHA2DS2-VASc of 3.  Status post ablation 08/20/2018 with repeat ablation 05/11/2019.  She  is currently doing well without further palpitations.  No changes.  2.  Hypertension: Currently well controlled  Current medicines are reviewed at length with the patient today.   The patient does not have concerns regarding her medicines.  The following changes were made today: None  Labs/ tests ordered today include:  Orders Placed This Encounter  Procedures  . EKG 12-Lead     Disposition:   FU with Taraneh Metheney 6 months  Signed, Mushka Laconte Jorja Loa, MD  06/05/2020 11:21 AM     [facility] [address] [place] [address] [phone] (office) [phone] (fax)

## 2020-06-05 NOTE — Patient Instructions (Signed)
Medication Instructions:  Your physician recommends that you continue on your current medications as directed. Please refer to the Current Medication list given to you today.  *If you need a refill on your cardiac medications before your next appointment, please call your pharmacy*   Lab Work: None ordered  Testing/Procedures: None ordered   Follow-Up: At [facility], you and your health needs are our priority.  As part of our continuing mission to provide you with exceptional heart care, we have created designated Provider Care Teams.  These Care Teams include your primary Cardiologist (physician) and Advanced Practice Providers (APPs -  Physician Assistants and Nurse Practitioners) who all work together to provide you with the care you need, when you need it.  Your next appointment:   6 month(s)  The format for your next appointment:   In Person  Provider:   Will Camnitz, MD    Thank you for choosing [facility]!!   Cicero Noy, RN [phone]   Other Instructions     

## 2020-06-06 ENCOUNTER — Ambulatory Visit (HOSPITAL_COMMUNITY)
Admission: RE | Admit: 2020-06-06 | Discharge: 2020-06-06 | Disposition: A | Discharge status: Home Health | Payer: Medicare PPO | Source: Ambulatory Visit | Attending: Internal Medicine | Admitting: Internal Medicine

## 2020-06-06 ENCOUNTER — Encounter (HOSPITAL_COMMUNITY)
Admission: RE | Disposition: A | Discharge status: Home Health | Payer: Self-pay | Source: Ambulatory Visit | Attending: Internal Medicine

## 2020-06-06 ENCOUNTER — Other Ambulatory Visit: Payer: Self-pay

## 2020-06-06 ENCOUNTER — Encounter (HOSPITAL_COMMUNITY): Payer: Self-pay | Admitting: Internal Medicine

## 2020-06-06 DIAGNOSIS — R1310 Dysphagia, unspecified: Secondary | ICD-10-CM

## 2020-06-06 DIAGNOSIS — K219 Gastro-esophageal reflux disease without esophagitis: Secondary | ICD-10-CM | POA: Diagnosis not present

## 2020-06-06 DIAGNOSIS — K3189 Other diseases of stomach and duodenum: Secondary | ICD-10-CM | POA: Diagnosis not present

## 2020-06-06 DIAGNOSIS — R1314 Dysphagia, pharyngoesophageal phase: Secondary | ICD-10-CM | POA: Diagnosis not present

## 2020-06-06 DIAGNOSIS — K319 Disease of stomach and duodenum, unspecified: Secondary | ICD-10-CM | POA: Insufficient documentation

## 2020-06-06 DIAGNOSIS — K317 Polyp of stomach and duodenum: Secondary | ICD-10-CM | POA: Diagnosis not present

## 2020-06-06 HISTORY — PX: MALONEY DILATION: SHX5535

## 2020-06-06 HISTORY — PX: ESOPHAGOGASTRODUODENOSCOPY: SHX5428

## 2020-06-06 HISTORY — PX: POLYPECTOMY: SHX5525

## 2020-06-06 HISTORY — PX: BIOPSY: SHX5522

## 2020-06-06 SURGERY — EGD (ESOPHAGOGASTRODUODENOSCOPY)
Anesthesia: Moderate Sedation

## 2020-06-06 MED ORDER — MEPERIDINE HCL 100 MG/ML IJ SOLN
INTRAMUSCULAR | Status: DC | PRN
Start: 2020-06-06 — End: 2020-06-06
  Administered 2020-06-06: 25 mg via INTRAVENOUS
  Administered 2020-06-06: 15 mg via INTRAVENOUS

## 2020-06-06 MED ORDER — ONDANSETRON HCL 4 MG/2ML IJ SOLN
INTRAMUSCULAR | Status: AC
  Filled 2020-06-06: qty 2

## 2020-06-06 MED ORDER — STERILE WATER FOR IRRIGATION IR SOLN
Status: DC | PRN
  Administered 2020-06-06: 1.5 mL

## 2020-06-06 MED ORDER — LIDOCAINE VISCOUS HCL 2 % MT SOLN
OROMUCOSAL | Status: AC
  Filled 2020-06-06: qty 15

## 2020-06-06 MED ORDER — ONDANSETRON HCL 4 MG/2ML IJ SOLN
INTRAMUSCULAR | Status: DC | PRN
  Administered 2020-06-06: 4 mg via INTRAVENOUS

## 2020-06-06 MED ORDER — SODIUM CHLORIDE 0.9 % IV SOLN: INTRAVENOUS | Status: DC

## 2020-06-06 MED ORDER — MEPERIDINE HCL 50 MG/ML IJ SOLN
INTRAMUSCULAR | Status: DC
Start: 2020-06-06 — End: 2020-06-06
  Filled 2020-06-06: qty 1

## 2020-06-06 MED ORDER — MIDAZOLAM HCL 5 MG/5ML IJ SOLN
INTRAMUSCULAR | Status: DC | PRN
  Administered 2020-06-06: 1 mg via INTRAVENOUS
  Administered 2020-06-06: 2 mg via INTRAVENOUS

## 2020-06-06 MED ORDER — MIDAZOLAM HCL 5 MG/5ML IJ SOLN
INTRAMUSCULAR | Status: AC
  Filled 2020-06-06: qty 10

## 2020-06-06 MED ORDER — LIDOCAINE VISCOUS HCL 2 % MT SOLN
OROMUCOSAL | Status: DC | PRN
  Administered 2020-06-06: 5 mL via OROMUCOSAL

## 2020-06-06 NOTE — Op Note (Signed)
[facility] Patient Name: Hailey [name] Procedure Date: 06/06/2020 7:14 AM MRN: [id number] Date of Birth: [birth year] Attending MD: Gennette Pac , MD CSN: [id number] Age: 79 Admit Type: Outpatient Procedure:                Upper GI endoscopy Indications:              Dysphagia, Heartburn Providers:                Gennette Pac, MD, Criselda Peaches. Patsy Lager, RN,                            Pandora Leiter, Technician Referring MD:              Medicines:                Midazolam 3 mg IV, Meperidine 40 mg IV Complications:            No immediate complications. Estimated Blood Loss:     Estimated blood loss was minimal. Procedure:                Pre-Anesthesia Assessment:                           - Prior to the procedure, a History and Physical                            was performed, and patient medications and                            allergies were reviewed. The patient's tolerance of                            previous anesthesia was also reviewed. The risks                            and benefits of the procedure and the sedation                            options and risks were discussed with the patient.                            All questions were answered, and informed consent                            was obtained. Prior Anticoagulants: The patient                            last took Eliquis (apixaban) 2 days prior to the                            procedure. ASA Grade Assessment: II - A patient                            with mild systemic disease. After reviewing the  risks and benefits, the patient was deemed in                            satisfactory condition to undergo the procedure.                           After obtaining informed consent, the endoscope was                            passed under direct vision. Throughout the                            procedure, the patient's blood pressure, pulse, and                             oxygen saturations were monitored continuously. The                            GIF-H190 ([id number]) scope was introduced through the                            mouth, and advanced to the second part of duodenum.                            The upper GI endoscopy was accomplished without                            difficulty. The patient tolerated the procedure                            well. Scope In: 7:53:51 AM Scope Out: 8:03:04 AM Total Procedure Duration: 0 hours 9 minutes 13 seconds  Findings:      The examined esophagus was normal.      A few 3 mm pedunculated and sessile polyps were found in the gastric       body. Stomach otherwise appeared normal.      The duodenal bulb and second portion of the duodenum were normal. The       scope was withdrawn. Dilation was performed with a Maloney dilator with       mild resistance at 54 Fr. The dilation site was examined following       endoscope reinsertion and showed no change. The polyp was removed with a       cold biopsy forceps. Resection and retrieval were complete. Estimated       blood loss was minimal. . Impression:               - Normal esophagus. Dilated.                           - A few gastric polyps. Resected and retrieved.                           - Normal duodenal bulb and second portion of the  duodenum. Moderate Sedation:      Moderate (conscious) sedation was administered by the endoscopy nurse       and supervised by the endoscopist. The following parameters were       monitored: oxygen saturation, heart rate, blood pressure, respiratory       rate, EKG, adequacy of pulmonary ventilation, and response to care.       Total physician intraservice time was 15 minutes. Recommendation:           - Patient has a contact number available for                            emergencies. The signs and symptoms of potential                            delayed complications were discussed with the                             patient. Return to normal activities tomorrow.                            Written discharge instructions were provided to the                            patient.                           - Advance diet as tolerated.                           - Continue present medications; however, stop                            omeprazole. Trial of Dexilant 60 mg daily.                            Follow-up on pathology. Resume Eliquis today                           - Return to my office in 6 weeks. Procedure Code(s):        --- Professional ---                           [phone], Esophagogastroduodenoscopy, flexible,                            transoral; with biopsy, single or multiple                           43450, Dilation of esophagus, by unguided sound or                            bougie, single or multiple passes                           G0500, Moderate sedation services provided by the  same physician or other qualified health care                            professional performing a gastrointestinal                            endoscopic service that sedation supports,                            requiring the presence of an independent trained                            observer to assist in the monitoring of the                            patient's level of consciousness and physiological                            status; initial 15 minutes of intra-service time;                            patient age 30 years or older (additional time may                            be reported with [id number], as appropriate) Diagnosis Code(s):        --- Professional ---                           K31.7, Polyp of stomach and duodenum                           R13.10, Dysphagia, unspecified                           R12, Heartburn CPT copyright 2019 American Medical Association. All rights reserved. The codes documented in this report are preliminary and upon coder  review may  be revised to meet current compliance requirements. Gerrit Friendsobert M. Kairi Tufo, MD Gennette Pacobert Michael Cutler Sunday, MD 06/06/2020 8:27:32 AM This report has been signed electronically. Number of Addenda: 0

## 2020-06-06 NOTE — H&P (Addendum)
@LOGO @   Primary Care Physician:  , MD Primary Gastroenterologist:  Dr. Richardean Chimera  Pre-Procedure History & Physical: HPI:  Hailey [name] is a 79 y.o. female here for further evaluation of refractory GERD and esophageal dysphagia.  Omeprazole 20 mg twice daily inadequate in controlling her reflux symptoms. Eliquis held 3 days ago. Past Medical History:  Diagnosis Date  . Atrial fibrillation (HCC)   . Breast nodule 11/16/2013  . H/O hematuria   . HLD (hyperlipidemia)   . Hypertension   . 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 Migraine headache   . Palpitations   . Restless leg syndrome   . Vaginal atrophy     Past Surgical History:  Procedure Laterality Date  . APPENDECTOMY    . ATRIAL FIBRILLATION ABLATION N/A 08/20/2018   Procedure: ATRIAL FIBRILLATION ABLATION;  Surgeon: 08/22/2018, MD;  Location: [facility] INVASIVE CV LAB;  Service: Cardiovascular;  Laterality: N/A;  . ATRIAL FIBRILLATION ABLATION N/A 05/11/2019   Procedure: ATRIAL FIBRILLATION ABLATION;  Surgeon: 07/11/2019, MD;  Location: [facility] INVASIVE CV LAB;  Service: Cardiovascular;  Laterality: N/A;  . CARDIAC CATHETERIZATION  2005    cone  . CARDIOVERSION N/A 05/02/2019   Procedure: CARDIOVERSION;  Surgeon: 05/04/2019, MD;  Location: [facility] ENDOSCOPY;  Service: Cardiovascular;  Laterality: N/A;  . COLONOSCOPY    . HERNIA REPAIR    . LEFT HEART CATHETERIZATION WITH CORONARY ANGIOGRAM N/A 04/12/2012   Procedure: LEFT HEART CATHETERIZATION WITH CORONARY ANGIOGRAM;  Surgeon: 06/12/2012, MD;  Location: [facility] CATH LAB;  Service: Cardiovascular;  Laterality: N/A;  . TOTAL ABDOMINAL HYSTERECTOMY      Prior to Admission medications   Medication Sig Start Date End Date Taking? Authorizing Provider  acetaminophen (TYLENOL) 650 MG CR tablet Take 1,300 mg by mouth 2 (two) times daily.    Yes [provider]  apixaban (ELIQUIS) 5 MG TABS tablet Take 5 mg by mouth 2 (two) times  daily.   Yes [provider]  Calcium Carb-Cholecalciferol (CALCIUM 600+D3 PO) Take 1 tablet by mouth daily.    Yes [provider]  diltiazem (CARDIZEM CD) 180 MG 24 hr capsule Take 1 capsule (180 mg total) by mouth daily. 02/09/20  Yes Camnitz, 04/11/20, MD  flecainide (TAMBOCOR) 50 MG tablet Take 1 tablet (50 mg total) by mouth 2 (two) times daily. 03/15/20  Yes Camnitz, Will 05/15/20, MD  gabapentin (NEURONTIN) 100 MG capsule Take 100 mg by mouth at bedtime.   Yes [provider]  loratadine-pseudoephedrine (CLARITIN-D 12-HOUR) 5-120 MG tablet Take 1 tablet by mouth daily as needed for allergies.   Yes [provider]  Multiple Vitamin (MULTIVITAMIN WITH MINERALS) TABS tablet Take 1 tablet by mouth daily. Centrum Silver for Women    Yes [provider]  omeprazole (PRILOSEC) 20 MG capsule Take 20 mg by mouth 2 (two) times daily before a meal.   Yes [provider]  Polyethyl Glycol-Propyl Glycol (SYSTANE OP) Place 1 drop into both eyes 2 (two) times daily as needed (dry eyes).   Yes [provider]  calcium carbonate (TUMS - DOSED IN MG ELEMENTAL CALCIUM) 500 MG chewable tablet Chew 1 tablet by mouth as needed for indigestion or heartburn.     [provider]  fluticasone (FLONASE) 50 MCG/ACT nasal spray Place 2 sprays into both nostrils daily as needed for allergies or rhinitis.     [provider]  metroNIDAZOLE (METROCREAM) 0.75 % cream  Apply 1 application topically 2 (two) times daily as needed (rosacea).  04/27/19   [provider]    Allergies as of 04/18/2020 - Review Complete 04/17/2020  Allergen Reaction Noted  . Amiodarone Itching 04/24/2018  . Flomax [tamsulosin hcl] Nausea And Vomiting 01/24/2015    Family History  Problem Relation Age of Onset  . Coronary artery disease Father        mid 60s  . Hypertension Mother   . Stroke Brother   . Transient ischemic attack Brother   . Other Brother         on oxygen  . Hypertension Brother   . Stroke Brother   . Heart disease Brother   . Heart attack Brother   . Other Sister        a fib, restless leg  . Other Sister        pressure in eyes  . Colon cancer Neg Hx   . Gastric cancer Neg Hx   . Esophageal cancer Neg Hx     Social History   Socioeconomic History  . Marital status: Single    Spouse name: Not on file  . Number of children: 0  . Years of education: College  . Highest education level: Not on file  Occupational History  . Occupation: retired  Tobacco Use  . Smoking status: Never Smoker  . Smokeless tobacco: Never Used  Vaping Use  . Vaping Use: Never used  Substance and Sexual Activity  . Alcohol use: Yes    Comment: rarely; glass of wine with dinner  . Drug use: No  . Sexual activity: Never    Birth control/protection: Surgical    Comment: hyst  Other Topics Concern  . Not on file  Social History Narrative   Single. Retired.    Patient lives at home alone.   Caffeine Use: none   Social Determinants of Health   Financial Resource Strain:   . Difficulty of Paying Living Expenses: Not on file  Food Insecurity:   . Worried About Programme researcher, broadcasting/film/video in the Last Year: Not on file  . Ran Out of Food in the Last Year: Not on file  Transportation Needs:   . Lack of Transportation (Medical): Not on file  . Lack of Transportation (Non-Medical): Not on file  Physical Activity:   . Days of Exercise per Week: Not on file  . Minutes of Exercise per Session: Not on file  Stress:   . Feeling of Stress : Not on file  Social Connections:   . Frequency of Communication with Friends and Family: Not on file  . Frequency of Social Gatherings with Friends and Family: Not on file  . Attends Religious Services: Not on file  . Active Member of Clubs or Organizations: Not on file  . Attends Banker Meetings: Not on file  . Marital Status: Not on file  Intimate Partner Violence:   . Fear of Current or  Ex-Partner: Not on file  . Emotionally Abused: Not on file  . Physically Abused: Not on file  . Sexually Abused: Not on file    Review of Systems: See HPI, otherwise negative ROS  Physical Exam: BP (!) 152/75   Pulse 67   Temp 97.8 F (36.6 C) (Oral)   Resp 18   Ht 5\' 7"  (1.702 m)   Wt 87.5 kg   SpO2 98%   BMI 30.23 kg/m  General:   Alert,  Well-developed, well-nourished, pleasant and cooperative in  NAD  adenopathy. Lungs:  Clear throughout to auscultation.   No wheezes, crackles, or rhonchi. No acute distress. Heart:  Regular rate and rhythm; no murmurs, clicks, rubs,  or gallops. Abdomen: Non-distended, normal bowel sounds.  Soft and nontender without appreciable mass or hepatosplenomegaly.  Pulses:  Normal pulses noted. Extremities:  Without clubbing or edema.  Impression/Plan: 79 year old lady with refractory GERD and esophageal dysphagia.  Here for EGD with possible esophageal dilation as feasible/appropriate per plan.  Eliquis held 3 days ago.  The risks, benefits, limitations, alternatives and imponderables have been reviewed with the patient. Potential for esophageal dilation, biopsy, etc. have also been reviewed.  Questions have been answered. All parties agreeable.     Notice: This dictation was prepared with Dragon dictation along with smaller phrase technology. Any transcriptional errors that result from this process are unintentional and may not be corrected upon review.

## 2020-06-06 NOTE — Discharge Instructions (Signed)
EGD Discharge instructions Please read the instructions outlined below and refer to this sheet in the next few weeks. These discharge instructions provide you with general information on caring for yourself after you leave the hospital. Your doctor may also give you specific instructions. While your treatment has been planned according to the most current medical practices available, unavoidable complications occasionally occur. If you have any problems or questions after discharge, please call your doctor. ACTIVITY  You may resume your regular activity but move at a slower pace for the next 24 hours.   Take frequent rest periods for the next 24 hours.   Walking will help expel (get rid of) the air and reduce the bloated feeling in your abdomen.   No driving for 24 hours (because of the anesthesia (medicine) used during the test).   You may shower.   Do not sign any important legal documents or operate any machinery for 24 hours (because of the anesthesia used during the test).  NUTRITION  Drink plenty of fluids.   You may resume your normal diet.   Begin with a light meal and progress to your normal diet.   Avoid alcoholic beverages for 24 hours or as instructed by your caregiver.  MEDICATIONS  You may resume your normal medications unless your caregiver tells you otherwise.  WHAT YOU CAN EXPECT TODAY  You may experience abdominal discomfort such as a feeling of fullness or gas pains.  FOLLOW-UP  Your doctor will discuss the results of your test with you.  SEEK IMMEDIATE MEDICAL ATTENTION IF ANY OF THE FOLLOWING OCCUR:  Excessive nausea (feeling sick to your stomach) and/or vomiting.   Severe abdominal pain and distention (swelling).   Trouble swallowing.   Temperature over 101 F (37.8 C).   Rectal bleeding or vomiting of blood.   Your esophagus was stretched today.  I removed one polyp from your stomach  Stop omeprazole  Begin Dexilant 60 mg daily for acid  reflux.  Resume Eliquis today  Office visit with Wynne Dust in 6 weeks  Further recommendations to follow pending review of pathology report  At patient request, I called Gae Bon at [phone]  -reviewed results and recommendations

## 2020-06-07 ENCOUNTER — Encounter: Payer: Self-pay | Admitting: Internal Medicine

## 2020-06-07 ENCOUNTER — Other Ambulatory Visit: Payer: Self-pay

## 2020-06-07 LAB — SURGICAL PATHOLOGY

## 2020-06-11 ENCOUNTER — Telehealth: Payer: Self-pay

## 2020-06-11 NOTE — Telephone Encounter (Signed)
Received a letter from [facility] stating that Dexilant is marked at a high cost of $64.00 and could change to Omeprazole. Spoke with pt and she is ok taking the Dexilant at this time since she was asked to d/c Omeprazole at her last ov.

## 2020-06-12 ENCOUNTER — Encounter (HOSPITAL_COMMUNITY): Payer: Self-pay | Admitting: Internal Medicine

## 2020-06-13 ENCOUNTER — Telehealth: Payer: Self-pay

## 2020-06-13 NOTE — Telephone Encounter (Signed)
EGD was last week 06/06/20. Pt d/c Omeprazole as directed and started Dexilant 60 mg. Pt reports tongue pain and pts tongue is completely white x 1 week. Pt hasn't started any new medications other than Dexilant and isn't sure if it's causing the pain and white tongue. Pt reports no other symptoms.

## 2020-06-14 ENCOUNTER — Other Ambulatory Visit: Payer: Self-pay

## 2020-06-14 MED ORDER — MAGIC MOUTHWASH: ORAL | 0 refills | Status: DC

## 2020-06-14 NOTE — Telephone Encounter (Signed)
Spoke with Dr. Jena Gauss and pt will swish and swallow 10 cc po tid x 5 days. Spoke with pt. Pt is aware and will start magic mouth wash. Pt will also continue Dexilant and monitor symptoms. Will follow up with pt next week per Dr. Jena Gauss.

## 2020-06-14 NOTE — Telephone Encounter (Signed)
Dr. Jena Gauss, how many cc's do you want her to take 3 times daily?

## 2020-06-14 NOTE — Telephone Encounter (Signed)
It is possible it is a coincidence.  Possible it could be medication related.  I would hope she could take Dexilant.  Will help her reflux symptoms.  I think it be worthwhile to treat her thrush empirically.  We need to call in some Dukes mixture or Magic mouthwash containing nystatin.  Swish and swallow and cc 3 times a day x5 days.  No refills  Touch base with her the first of the week and see how she is doing.  Would like her to continue the Dexilant for the time being  If things get worse she is to let us know.

## 2020-06-19 ENCOUNTER — Telehealth: Payer: Self-pay | Admitting: Internal Medicine

## 2020-06-19 DIAGNOSIS — Z23 Encounter for immunization: Secondary | ICD-10-CM | POA: Diagnosis not present

## 2020-06-19 NOTE — Telephone Encounter (Signed)
Patient apparently intolerant to a multitude of PPIs.  For now, I recommend she stop the Dexilant.  We will keep the treatment approach simple and go with Pepcid Complete  - over-the-counter Imodium Dean along with calcium carbonate  Take 1 to 2 tablets twice daily (morning and evening -may take before after meals)  She should have an office visit to see Wynne Dust in the coming weeks.  Finish up nystatin.  Mouth issue should resolve

## 2020-06-19 NOTE — Telephone Encounter (Signed)
Spoke with pt. Pt was notified of Dr. Luvenia Starch recommendations. Pt will d/c Dexilant 60 mg, start otc Pepcid complete 1-2 times daily and Imodium for diarrhea. Pt has an upcoming apt with Wynne Dust, NP.

## 2020-06-19 NOTE — Telephone Encounter (Signed)
Patient needs to speak to you about a medication, was put on dexilant and she is having diarrhea.  Is there a lower dose of that medication

## 2020-06-19 NOTE — Telephone Encounter (Signed)
Pt returned call. Pt continues to have problems with taking Dexilant 60 mg. Pt reports diarrhea after taking the medication daily. Pt started out with what appears to be thrush. Pt has two more doses of the medication solution sent in to treat thrush. Pts tongue is mildly sore and pts tongue is coated white.

## 2020-06-25 DIAGNOSIS — D225 Melanocytic nevi of trunk: Secondary | ICD-10-CM | POA: Diagnosis not present

## 2020-06-25 DIAGNOSIS — C44622 Squamous cell carcinoma of skin of right upper limb, including shoulder: Secondary | ICD-10-CM | POA: Diagnosis not present

## 2020-06-25 DIAGNOSIS — Z85828 Personal history of other malignant neoplasm of skin: Secondary | ICD-10-CM | POA: Diagnosis not present

## 2020-06-25 DIAGNOSIS — D0471 Carcinoma in situ of skin of right lower limb, including hip: Secondary | ICD-10-CM | POA: Diagnosis not present

## 2020-06-25 DIAGNOSIS — L821 Other seborrheic keratosis: Secondary | ICD-10-CM | POA: Diagnosis not present

## 2020-06-25 DIAGNOSIS — L570 Actinic keratosis: Secondary | ICD-10-CM | POA: Diagnosis not present

## 2020-06-25 DIAGNOSIS — L814 Other melanin hyperpigmentation: Secondary | ICD-10-CM | POA: Diagnosis not present

## 2020-06-25 DIAGNOSIS — D1801 Hemangioma of skin and subcutaneous tissue: Secondary | ICD-10-CM | POA: Diagnosis not present

## 2020-06-27 ENCOUNTER — Emergency Department (HOSPITAL_COMMUNITY)
Admission: EM | Admit: 2020-06-27 | Discharge: 2020-06-27 | Disposition: A | Discharge status: Home / Self Care | Payer: Medicare PPO | Attending: Emergency Medicine | Admitting: Emergency Medicine

## 2020-06-27 ENCOUNTER — Encounter (HOSPITAL_COMMUNITY): Payer: Self-pay | Admitting: Pediatrics

## 2020-06-27 ENCOUNTER — Other Ambulatory Visit: Payer: Self-pay

## 2020-06-27 ENCOUNTER — Emergency Department (HOSPITAL_COMMUNITY): Payer: Medicare PPO

## 2020-06-27 ENCOUNTER — Telehealth: Payer: Self-pay | Admitting: Internal Medicine

## 2020-06-27 DIAGNOSIS — R0789 Other chest pain: Secondary | ICD-10-CM | POA: Insufficient documentation

## 2020-06-27 DIAGNOSIS — Z7901 Long term (current) use of anticoagulants: Secondary | ICD-10-CM | POA: Diagnosis not present

## 2020-06-27 DIAGNOSIS — I4891 Unspecified atrial fibrillation: Secondary | ICD-10-CM | POA: Insufficient documentation

## 2020-06-27 DIAGNOSIS — I10 Essential (primary) hypertension: Secondary | ICD-10-CM | POA: Insufficient documentation

## 2020-06-27 DIAGNOSIS — I2510 Atherosclerotic heart disease of native coronary artery without angina pectoris: Secondary | ICD-10-CM | POA: Diagnosis not present

## 2020-06-27 DIAGNOSIS — K219 Gastro-esophageal reflux disease without esophagitis: Secondary | ICD-10-CM

## 2020-06-27 DIAGNOSIS — R12 Heartburn: Secondary | ICD-10-CM | POA: Insufficient documentation

## 2020-06-27 DIAGNOSIS — Z79899 Other long term (current) drug therapy: Secondary | ICD-10-CM | POA: Diagnosis not present

## 2020-06-27 DIAGNOSIS — Z955 Presence of coronary angioplasty implant and graft: Secondary | ICD-10-CM | POA: Insufficient documentation

## 2020-06-27 DIAGNOSIS — R079 Chest pain, unspecified: Secondary | ICD-10-CM | POA: Diagnosis not present

## 2020-06-27 LAB — CBC
HCT: 41.8 % (ref 36.0–46.0)
Hemoglobin: 13.5 g/dL (ref 12.0–15.0)
MCH: 33.2 pg (ref 26.0–34.0)
MCHC: 32.3 g/dL (ref 30.0–36.0)
MCV: 102.7 fL — ABNORMAL HIGH (ref 80.0–100.0)
Platelets: 302 10*3/uL (ref 150–400)
RBC: 4.07 MIL/uL (ref 3.87–5.11)
RDW: 13.4 % (ref 11.5–15.5)
WBC: 8.5 10*3/uL (ref 4.0–10.5)
nRBC: 0 % (ref 0.0–0.2)

## 2020-06-27 LAB — BASIC METABOLIC PANEL
Anion gap: 10 (ref 5–15)
BUN: 15 mg/dL (ref 8–23)
CO2: 27 mmol/L (ref 22–32)
Calcium: 9.4 mg/dL (ref 8.9–10.3)
Chloride: 103 mmol/L (ref 98–111)
Creatinine, Ser: 1.44 mg/dL — ABNORMAL HIGH (ref 0.44–1.00)
GFR, Estimated: 37 mL/min — ABNORMAL LOW (ref >60)
Glucose, Bld: 105 mg/dL — ABNORMAL HIGH (ref 70–99)
Potassium: 4.1 mmol/L (ref 3.5–5.1)
Sodium: 140 mmol/L (ref 135–145)

## 2020-06-27 LAB — PROTIME-INR
INR: 1.1 (ref 0.8–1.2)
Prothrombin Time: 13.8 seconds (ref 11.4–15.2)

## 2020-06-27 LAB — TROPONIN I (HIGH SENSITIVITY)
Troponin I (High Sensitivity): 8 ng/L (ref <18)
Troponin I (High Sensitivity): 7 ng/L (ref <18)

## 2020-06-27 MED ORDER — OMEPRAZOLE 20 MG PO CPDR: 20.0000 mg | DELAYED_RELEASE_CAPSULE | Freq: Two times a day (BID) | ORAL | 0 refills | Status: DC

## 2020-06-27 MED ORDER — SUCRALFATE 1 G PO TABS: 1.0000 g | ORAL_TABLET | Freq: Three times a day (TID) | ORAL | 0 refills | Status: DC

## 2020-06-27 NOTE — Telephone Encounter (Signed)
The patient called and stated her pcp thinks her symptoms is GERD related, however she is going to the ED to be evaluated for her chest pain.  Routing to RMR as a [employer]

## 2020-06-27 NOTE — Discharge Instructions (Addendum)
Stop taking Pepcid.  Begin taking omeprazole and Carafate as prescribed tonight.  Follow-up next week with your gastroenterologist, and return to the ER in the meantime if symptoms significantly worsen or change.

## 2020-06-27 NOTE — ED Notes (Signed)
The pt has had some mild mid-chest pain since she had an endoscopy this past week and she has had this pain since then alert oriented skin warm and dry minimal pain only at  This time

## 2020-06-27 NOTE — ED Notes (Signed)
Minimal pain

## 2020-06-27 NOTE — ED Provider Notes (Signed)
[facility]   CSN: [id number] Arrival date & time: 06/27/20  1556     History Chief Complaint  Patient presents with  . Chest Pain    Hailey [name] is a 79 y.o. female.  Patient is a 79 year old female with past medical history of atrial fibrillation, GERD, hypertension. She presents today for evaluation of heartburn. Patient has been having issues for the past month since medication changes prior to her colonoscopy. She was initially on omeprazole, however this was changed to Dexilant about the time of her colonoscopy. This was discontinued after she had recurrent diarrhea and changed to Pepcid. Since this time, she has had reflux symptoms. She denies any shortness of breath, nausea, diaphoresis, or radiation to the arm or jaw. She denies any exertional symptoms. She was seen by her GI doctor, then referred here for rule out of a cardiac etiology. She has history of A. fib, but no coronary artery disease.  The history is provided by the patient.  Chest Pain Pain location:  Epigastric Pain quality: burning   Pain radiates to:  Does not radiate Pain severity:  Moderate Duration:  3 weeks Timing:  Intermittent Progression:  Worsening Chronicity:  New      Past Medical History:  Diagnosis Date  . Atrial fibrillation (HCC)   . Breast nodule 11/16/2013  . H/O hematuria   . HLD (hyperlipidemia)   . Hypertension   . 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 Migraine headache   . Palpitations   . Restless leg syndrome   . Vaginal atrophy     Patient Active Problem List   Diagnosis Date Noted  . Dysphagia 04/17/2020  . GERD (gastroesophageal reflux disease) 11/29/2019  . Abdominal pain 11/29/2019  . Acquired thrombophilia (HCC) 06/22/2019  . Persistent atrial fibrillation (HCC) 05/11/2019  . Atrial flutter (HCC)   . Atrial fibrillation with RVR (HCC) 05/01/2019  . Essential hypertension   . Atrial fibrillation  (HCC) 07/14/2018  . Vaginal dryness 01/28/2017  . Vaginal atrophy 01/28/2017  . Breast nodule 11/16/2013  . Breast mass, right 12/22/2012  . OVERWEIGHT 04/13/2009  . GENERALIZED ANXIETY DISORDER 04/13/2009  . Coronary artery disease involving native heart without angina pectoris 04/13/2009  . PALPITATIONS 04/03/2009    Past Surgical History:  Procedure Laterality Date  . APPENDECTOMY    . ATRIAL FIBRILLATION ABLATION N/A 08/20/2018   Procedure: ATRIAL FIBRILLATION ABLATION;  Surgeon: Regan Lemmingamnitz, Will Martin, MD;  Location: [facility] INVASIVE CV LAB;  Service: Cardiovascular;  Laterality: N/A;  . ATRIAL FIBRILLATION ABLATION N/A 05/11/2019   Procedure: ATRIAL FIBRILLATION ABLATION;  Surgeon: Regan Lemmingamnitz, Will Martin, MD;  Location: [facility] INVASIVE CV LAB;  Service: Cardiovascular;  Laterality: N/A;  . BIOPSY  06/06/2020   Procedure: BIOPSY;  Surgeon: Corbin Adeourk, Robert M, MD;  Location: [facility] ENDO SUITE;  Service: Endoscopy;;  gastric  . CARDIAC CATHETERIZATION  2005    cone  . CARDIOVERSION N/A 05/02/2019   Procedure: CARDIOVERSION;  Surgeon: Jodelle Redhristopher, Bridgette, MD;  [facility];  Service: Cardiovascular;  Laterality: N/A;  . COLONOSCOPY    . ESOPHAGOGASTRODUODENOSCOPY N/A 06/06/2020   Procedure: ESOPHAGOGASTRODUODENOSCOPY (EGD);  Surgeon: Corbin Adeourk, Robert M, MD;  Location: [facility];  Service: Endoscopy;  Laterality: N/A;  7:30am  . HERNIA REPAIR    . LEFT HEART CATHETERIZATION WITH CORONARY ANGIOGRAM N/A 04/12/2012   Procedure: LEFT HEART CATHETERIZATION WITH CORONARY ANGIOGRAM;  Surgeon: Kathleene Hazelhristopher D McAlhany, MD;  [facility];  Service: Cardiovascular;  Laterality: N/A;  . [name] DILATION N/A 06/06/2020   Procedure: [name] DILATION;  Surgeon: Corbin Ade, MD;  Location: [facility];  Service: Endoscopy;  Laterality: N/A;  . POLYPECTOMY  06/06/2020   Procedure: POLYPECTOMY;  Surgeon: Corbin Ade, MD;  Location: [facility];  Service: Endoscopy;;  gastric  . TOTAL ABDOMINAL  HYSTERECTOMY       OB History    Gravida  1   Para      Term      Preterm      AB  1   Living  0     SAB  1   TAB      Ectopic      Multiple      Live Births              Family History  Problem Relation Age of Onset  . Coronary artery disease Father        mid 40s  . Hypertension Mother   . Stroke Brother   . Transient ischemic attack Brother   . Other Brother        on oxygen  . Hypertension Brother   . Stroke Brother   . Heart disease Brother   . Heart attack Brother   . Other Sister        a fib, restless leg  . Other Sister        pressure in eyes  . Colon cancer Neg Hx   . Gastric cancer Neg Hx   . Esophageal cancer Neg Hx     Social History   Tobacco Use  . Smoking status: Never Smoker  . Smokeless tobacco: Never Used  Vaping Use  . Vaping Use: Never used  Substance Use Topics  . Alcohol use: Yes    Comment: rarely; glass of wine with dinner  . Drug use: No    Home Medications Prior to Admission medications   Medication Sig Start Date End Date Taking? Authorizing Provider  acetaminophen (TYLENOL) 650 MG CR tablet Take 1,300 mg by mouth 2 (two) times daily.     [provider]  apixaban (ELIQUIS) 5 MG TABS tablet Take 5 mg by mouth 2 (two) times daily.    [provider]  Calcium Carb-Cholecalciferol (CALCIUM 600+D3 PO) Take 1 tablet by mouth daily.     [provider]  calcium carbonate (TUMS - DOSED IN MG ELEMENTAL CALCIUM) 500 MG chewable tablet Chew 1 tablet by mouth as needed for indigestion or heartburn.     [provider]  diltiazem (CARDIZEM CD) 180 MG 24 hr capsule Take 1 capsule (180 mg total) by mouth daily. 02/09/20   Camnitz, Andree Coss, MD  flecainide (TAMBOCOR) 50 MG tablet Take 1 tablet (50 mg total) by mouth 2 (two) times daily. 03/15/20   Camnitz, Will Daphine Deutscher, MD  fluticasone (FLONASE) 50 MCG/ACT nasal spray Place 2 sprays into both nostrils daily as needed for allergies or rhinitis.      [provider]  gabapentin (NEURONTIN) 100 MG capsule Take 100 mg by mouth at bedtime.    [provider]  loratadine-pseudoephedrine (CLARITIN-D 12-HOUR) 5-120 MG tablet Take 1 tablet by mouth daily as needed for allergies.    [provider]  magic mouthwash SOLN Swish and swallow 10 mls po tid daily x 5 days. 06/14/20   Rourk, Gerrit Friends, MD  metroNIDAZOLE (METROCREAM) 0.75 % cream Apply 1 application topically 2 (two) times daily as  needed (rosacea).  04/27/19   [provider]  Multiple Vitamin (MULTIVITAMIN WITH MINERALS) TABS tablet Take 1 tablet by mouth daily. Centrum Silver for Women     [provider]  omeprazole (PRILOSEC) 20 MG capsule Take 20 mg by mouth 2 (two) times daily before a meal.    [provider]  Polyethyl Glycol-Propyl Glycol (SYSTANE OP) Place 1 drop into both eyes 2 (two) times daily as needed (dry eyes).    [provider]    Allergies    Amiodarone and Flomax [tamsulosin hcl]  Review of Systems   Review of Systems  Cardiovascular: Positive for chest pain.  All other systems reviewed and are negative.   Physical Exam Updated Vital Signs BP (!) 145/71   Pulse 69   Temp 98.2 F (36.8 C) (Oral)   Resp 16   SpO2 100%   Physical Exam Vitals and nursing note reviewed.  Constitutional:      General: She is not in acute distress.    Appearance: She is well-developed. She is not diaphoretic.  HENT:     Head: Normocephalic and atraumatic.  Cardiovascular:     Rate and Rhythm: Normal rate and regular rhythm.     Heart sounds: No murmur heard.  No friction rub. No gallop.   Pulmonary:     Effort: Pulmonary effort is normal. No respiratory distress.     Breath sounds: Normal breath sounds. No wheezing.  Chest:     Chest wall: Tenderness present.     Comments: There is tenderness to palpation in the epigastric region. There is no rebound or guarding. Abdominal:     General: Bowel sounds are  normal. There is no distension.     Palpations: Abdomen is soft.     Tenderness: There is no abdominal tenderness.  Musculoskeletal:        General: Normal range of motion.     Cervical back: Normal range of motion and neck supple.  Skin:    General: Skin is warm and dry.  Neurological:     Mental Status: She is alert and oriented to person, place, and time.     ED Results / Procedures / Treatments   Labs (all labs ordered are listed, but only abnormal results are displayed) Labs Reviewed  BASIC METABOLIC PANEL - Abnormal; Notable for the following components:      Result Value   Glucose, Bld 105 (*)    Creatinine, Ser 1.44 (*)    GFR, Estimated 37 (*)    All other components within normal limits  CBC - Abnormal; Notable for the following components:   MCV 102.7 (*)    All other components within normal limits  PROTIME-INR  TROPONIN I (HIGH SENSITIVITY)  TROPONIN I (HIGH SENSITIVITY)    EKG EKG Interpretation  Date/Time:  Wednesday June 27 2020 16:02:02 EST Ventricular Rate:  73 PR Interval:  216 QRS Duration: 118 QT Interval:  424 QTC Calculation: 467 R Axis:   89 Text Interpretation: Sinus rhythm with 1st degree A-V block Low voltage QRS Incomplete right bundle branch block Abnormal ECG No significant change since 03/20/2020 Confirmed by Geoffery Lyons ([id number]) on 06/27/2020 6:19:06 PM   Radiology DG Chest 2 View  Result Date: 06/27/2020 CLINICAL DATA:  Chest pain. EXAM: CHEST - 2 VIEW COMPARISON:  March 20, 2020. FINDINGS: The heart size and mediastinal contours are within normal limits. Both lungs are clear. No pneumothorax or pleural effusion is noted. The visualized skeletal structures are unremarkable.  IMPRESSION: No active cardiopulmonary disease. Electronically Signed   By: Lupita Raider M.D.   On: 06/27/2020 16:51    Procedures Procedures (including critical care time)  Medications Ordered in ED Medications - No data to display  ED Course  I  have reviewed the triage vital signs and the nursing notes.  Pertinent labs & imaging results that were available during my care of the patient were reviewed by me and considered in my medical decision making (see chart for details).    MDM Rules/Calculators/A&P  Patient presenting with complaints of burning in her epigastrium.  She has been having testing by gastroenterology for suspected GERD.  Her reflux medications have been changed and she has had endoscopy in the past few weeks.  She was sent today from gastroenterology for rule out of cardiac etiology.  Patient's work-up today is unremarkable for a cardiac etiology.  I highly suspect this is reflux.  Patient was doing well on omeprazole, however was changed to Pepcid recently.  I will have her stop taking this and change her medication to omeprazole and add Carafate.  She is to follow-up with her GI doctor next week and return in the meantime if symptoms worsen or change.  Final Clinical Impression(s) / ED Diagnoses Final diagnoses:  None    Rx / DC Orders ED Discharge Orders    None       Geoffery Lyons, MD 06/27/20 2209

## 2020-06-27 NOTE — Telephone Encounter (Signed)
[phone] nausea and weakness, please call patient,. Needs to speak to a nurse

## 2020-06-27 NOTE — ED Triage Notes (Signed)
Patient reported she had endoscopy in the beginning of the month and had some polyps removed, her GI also changed some of her medications, c/o nausea, and some chest discomfort. Here today as advised by GI for further eval.

## 2020-06-27 NOTE — Telephone Encounter (Signed)
Spoke with pt. Pt mentioned that she started having weakness and nausea last Thursday 06/21/2020. Pt's diarrhea cleared up after d/c Dexilant 60 mg with 1 Imodium. Pt started the Pepcid for the [facility] and feels she has had some weakness. Pts tongue has improved with the magic mouth was that was used last week to treat ? Thrush. Pt also mentioned that she got her Covid booster last week prior to feeling weak. Pt was advised to call her PCP to let them know all symptoms that she has been through and is currently feeling with weakness ect. Pt also advised if she didn't want to call her PCP, she needed to go to the ED.

## 2020-07-01 NOTE — Telephone Encounter (Signed)
Communications noted 

## 2020-07-02 NOTE — Telephone Encounter (Signed)
Noted  

## 2020-07-13 DIAGNOSIS — E782 Mixed hyperlipidemia: Secondary | ICD-10-CM | POA: Diagnosis not present

## 2020-07-13 DIAGNOSIS — N1832 Chronic kidney disease, stage 3b: Secondary | ICD-10-CM | POA: Diagnosis not present

## 2020-07-13 DIAGNOSIS — I10 Essential (primary) hypertension: Secondary | ICD-10-CM | POA: Diagnosis not present

## 2020-07-13 DIAGNOSIS — K2100 Gastro-esophageal reflux disease with esophagitis, without bleeding: Secondary | ICD-10-CM | POA: Diagnosis not present

## 2020-07-13 DIAGNOSIS — E041 Nontoxic single thyroid nodule: Secondary | ICD-10-CM | POA: Diagnosis not present

## 2020-07-16 ENCOUNTER — Other Ambulatory Visit: Payer: Self-pay | Admitting: Cardiology

## 2020-07-17 ENCOUNTER — Telehealth: Payer: Self-pay

## 2020-07-17 ENCOUNTER — Encounter: Payer: Self-pay | Admitting: Nurse Practitioner

## 2020-07-17 ENCOUNTER — Ambulatory Visit: Payer: Medicare PPO | Admitting: Nurse Practitioner

## 2020-07-17 ENCOUNTER — Other Ambulatory Visit: Payer: Self-pay

## 2020-07-17 ENCOUNTER — Telehealth: Payer: Self-pay | Admitting: Internal Medicine

## 2020-07-17 VITALS — BP 147/78 | HR 66 | Temp 96.6°F | Ht 67.0 in | Wt 192.8 lb

## 2020-07-17 DIAGNOSIS — R1319 Other dysphagia: Secondary | ICD-10-CM

## 2020-07-17 DIAGNOSIS — K219 Gastro-esophageal reflux disease without esophagitis: Secondary | ICD-10-CM

## 2020-07-17 DIAGNOSIS — R1013 Epigastric pain: Secondary | ICD-10-CM

## 2020-07-17 MED ORDER — ESOMEPRAZOLE MAGNESIUM 20 MG PO CPDR: 20.0000 mg | DELAYED_RELEASE_CAPSULE | Freq: Every day | ORAL | 1 refills | Status: DC

## 2020-07-17 MED ORDER — PANTOPRAZOLE SODIUM 40 MG PO TBEC: 40.0000 mg | DELAYED_RELEASE_TABLET | Freq: Every day | ORAL | 1 refills | Status: DC

## 2020-07-17 NOTE — Telephone Encounter (Addendum)
This was not on her allergy list. Will add to the list.  Will send in esomeprazole (Nexium) 20 mg daily instead. There is the option to increase to 40 mg daily or 20-40 mg twice daily, depending on how it does (but patient preferred to start low dose). Only one PPI left if nexium ineffective (Aciphex).  Please call with progress report in 2-4 weeks as discussed this morning.

## 2020-07-17 NOTE — Progress Notes (Signed)
CC'ED TO PCP 

## 2020-07-17 NOTE — Progress Notes (Signed)
Referring Provider: Richardean Chimeraaniel, Terry G, MD Primary Care Physician:  Richardean Chimeraaniel, Terry G, MD Primary GI:  Dr. Jena Gaussourk  Chief Complaint  Patient presents with  . Gastroesophageal Reflux    Dexilant caused major diarrhea. Hurting in chest since approx 06/21/20. Went to ED. Taking Omeprazole bid    HPI:   Hailey [name] is a 79 y.o. female who presents for follow-up on GERD.  The patient was last seen in our office 04/17/2020 for the same as well as epigastric pain and dysphagia.  Previous chest pain cardiac work-up which was negative.  Longstanding chronic history of GERD.  Recent attempt at Carafate with side effects.  She was not wanting reflux surgery.  Emergency department evaluation in August 2021 for chest pain deemed likely GERD and resolution after GI cocktail.  At her last visit noted significant side effects of increased omeprazole to 40 mg twice daily, similar issues with increased flecainide.  She subsequently decreased her dose of omeprazole to 20 mg twice daily but persistent GERD symptoms, although not as bad.  Symptoms worse in the morning.  Thinks Dexilant worked okay, but overall omeprazole is "okay" but not great.  Occasional solid food dysphagia worse with breads and meats.  Nausea but no vomiting.  No other overt GI complaints.  Recommended EGD with possible dilation, continue other medications, may eventually change to Dexilant, follow-up in 3 months.  EGD completed 06/06/2020 which found normal esophagus, a few 3 mm pedunculated and sessile polyps in the gastric body, esophagus status post dilation, polyps removed, normal duodenum.  Recommended stop omeprazole and trial Dexilant 60 mg daily.  Patient called our office noting a white, painful tongue since starting Dexilant.  Recommended empiric treatment for thrush with some Dukes mixture or Magic mouthwash with nystatin swish and swallow 10 cc, 3 times a day for 5 days.  Follow-up first of the week.  The patient called a week later  indicating diarrhea with Dexilant.  Apparently intolerant to multiple PPIs, recommended start Dexilant and start over-the-counter Pepcid Complete and Imodium with calcium carbonate 1 to 2 tablets twice daily.  Finish nystatin, follow-up in the next couple weeks.  She called our office 06/27/2020 indicating diarrhea resolved when stopped Dexilant, using Imodium.  Some weakness since starting Pepcid.  Also had her Covid booster at the previous week.  Recommended calling primary care.  Patient was seen in emergency department 06/27/2020 for atypical chest pain and GERD.  Pain noted to be epigastric and burning for 3 weeks that is worsening and intermittent.  Unremarkable cardiac work-up.  Felt likely due to reflux.  Her GERD medication was switched back to omeprazole and recommended adding Carafate with GI follow-up.  Apparently she has tried/failed Omeprazole (minimally effective at low dose, ADEs at high dose), Dexilant (ADEs), Pepcid (ineffective), Carafate (ineffective and ADEs).  Has not tried: pantoprazole, esomeprazole, or lansoprazole.   Today she states she doing okay overall. She is still having epigastric pain despite omeprazole. Previously didn't tolerate higher dose. Carafate not helping. She states she is sensitive to a lot of medications and wants to start on a low dose. Pain is epigastric, burning in the chest. Has had esophageal dilation in the past which may be contributing.  No breakthrough/recurrent dysphagia since EGD with dilation.  Denies N/V, hematochezia, melena, fever, chills, unintentional weight loss. Denies URI or flu-like symptoms. Denies loss of sense of taste or smell. The patient has received COVID-19 vaccination(s). Denies chest pain, dyspnea, dizziness, lightheadedness, syncope, near syncope. Denies  any other upper or lower GI symptoms.  Past Medical History:  Diagnosis Date  . Atrial fibrillation (HCC)   . Breast nodule 11/16/2013  . H/O hematuria   . HLD  (hyperlipidemia)   . Hypertension   . 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 Migraine headache   . Palpitations   . Restless leg syndrome   . Vaginal atrophy     Past Surgical History:  Procedure Laterality Date  . APPENDECTOMY    . ATRIAL FIBRILLATION ABLATION N/A 08/20/2018   Procedure: ATRIAL FIBRILLATION ABLATION;  Surgeon: Regan Lemming, MD;  Location: [facility];  Service: Cardiovascular;  Laterality: N/A;  . ATRIAL FIBRILLATION ABLATION N/A 05/11/2019   Procedure: ATRIAL FIBRILLATION ABLATION;  Surgeon: Regan Lemming, MD;  Location: [facility]VASIVE CV LAB;  Service: Cardiovascular;  Laterality: N/A;  . BIOPSY  06/06/2020   Procedure: BIOPSY;  Surgeon: Corbin Ade, MD;  Location: [facility];  Service: Endoscopy;;  gastric  . CARDIAC CATHETERIZATION  2005    cone  . CARDIOVERSION N/A 05/02/2019   Procedure: CARDIOVERSION;  Surgeon: Jodelle Red, MD;  Location: [facility] ENDOSCOPY;  Service: Cardiovascular;  Laterality: N/A;  . COLONOSCOPY    . ESOPHAGOGASTRODUODENOSCOPY N/A 06/06/2020   Procedure: ESOPHAGOGASTRODUODENOSCOPY (EGD);  Surgeon: Corbin Ade, MD;  Location: [facility];  Service: Endoscopy;  Laterality: N/A;  7:30am  . HERNIA REPAIR    . LEFT HEART CATHETERIZATION WITH CORONARY ANGIOGRAM N/A 04/12/2012   Procedure: LEFT HEART CATHETERIZATION WITH CORONARY ANGIOGRAM;  Surgeon: Kathleene Hazel, MD;  Location: [facility] CATH LAB;  Service: Cardiovascular;  Laterality: N/A;  . [name] DILATION N/A 06/06/2020   Procedure: [name] DILATION;  Surgeon: Corbin Ade, MD;  Location: [facility];  Service: Endoscopy;  Laterality: N/A;  . POLYPECTOMY  06/06/2020   Procedure: POLYPECTOMY;  Surgeon: Corbin Ade, MD;  Location: [facility];  Service: Endoscopy;;  gastric  . TOTAL ABDOMINAL HYSTERECTOMY      Current Outpatient Medications  Medication Sig Dispense Refill  . acetaminophen (TYLENOL) 650 MG CR tablet Take 1,300 mg by mouth 2  (two) times daily.    . Alum & Mag Hydroxide-Simeth (MYLANTA PO) Take by mouth as needed.    Marland Kitchen apixaban (ELIQUIS) 5 MG TABS tablet Take 5 mg by mouth 2 (two) times daily.    . Calcium Carb-Cholecalciferol (CALCIUM 600+D3 PO) Take 1 tablet by mouth daily.     . calcium carbonate (TUMS - DOSED IN MG ELEMENTAL CALCIUM) 500 MG chewable tablet Chew 1 tablet by mouth as needed for indigestion or heartburn.     . diltiazem (CARDIZEM CD) 180 MG 24 hr capsule Take 1 capsule (180 mg total) by mouth daily. 30 capsule 5  . flecainide (TAMBOCOR) 50 MG tablet Take 1 tablet (50 mg total) by mouth 2 (two) times daily. 180 tablet 3  . fluticasone (FLONASE) 50 MCG/ACT nasal spray Place 2 sprays into both nostrils daily as needed for allergies or rhinitis.     Marland Kitchen gabapentin (NEURONTIN) 100 MG capsule Take 100 mg by mouth at bedtime.    Marland Kitchen loratadine-pseudoephedrine (CLARITIN-D 12-HOUR) 5-120 MG tablet Take 1 tablet by mouth daily as needed for allergies.    . metroNIDAZOLE (METROCREAM) 0.75 % cream Apply 1 application topically 2 (two) times daily as needed (rosacea).     . Multiple Vitamin (MULTIVITAMIN WITH MINERALS) TABS tablet Take 1 tablet by mouth daily. Centrum Silver for Women    .  omeprazole (PRILOSEC) 20 MG capsule Take 1 capsule (20 mg total) by mouth 2 (two) times daily before a meal. 60 capsule 0  . Polyethyl Glycol-Propyl Glycol (SYSTANE OP) Place 1 drop into both eyes 2 (two) times daily as needed (dry eyes).    . magic mouthwash SOLN Swish and swallow 10 mls po tid daily x 5 days. (Patient not taking: Reported on 07/17/2020) 150 mL 0  . sucralfate (CARAFATE) 1 g tablet Take 1 tablet (1 g total) by mouth 4 (four) times daily -  with meals and at bedtime. (Patient not taking: Reported on 07/17/2020) 30 tablet 0   No current facility-administered medications for this visit.    Allergies as of 07/17/2020 - Review Complete 07/17/2020  Allergen Reaction Noted  . Amiodarone Itching 04/24/2018  . Dexilant  [dexlansoprazole] Diarrhea 07/17/2020  . Flomax [tamsulosin hcl] Nausea And Vomiting 01/24/2015    Family History  Problem Relation Age of Onset  . Coronary artery disease Father        mid 50s  . Hypertension Mother   . Stroke Brother   . Transient ischemic attack Brother   . Other Brother        on oxygen  . Hypertension Brother   . Stroke Brother   . Heart disease Brother   . Heart attack Brother   . Other Sister        a fib, restless leg  . Other Sister        pressure in eyes  . Colon cancer Neg Hx   . Gastric cancer Neg Hx   . Esophageal cancer Neg Hx     Social History   Socioeconomic History  . Marital status: Single    Spouse name: Not on file  . Number of children: 0  . Years of education: College  . Highest education level: Not on file  Occupational History  . Occupation: retired  Tobacco Use  . Smoking status: Never Smoker  . Smokeless tobacco: Never Used  Vaping Use  . Vaping Use: Never used  Substance and Sexual Activity  . Alcohol use: Not Currently    Comment: rarely; glass of wine with dinner  . Drug use: No  . Sexual activity: Never    Birth control/protection: Surgical    Comment: hyst  Other Topics Concern  . Not on file  Social History Narrative   Single. Retired.    Patient lives at home alone.   Caffeine Use: none   Social Determinants of Corporate investment banker Strain: Not on file  Food Insecurity: Not on file  Transportation Needs: Not on file  Physical Activity: Not on file  Stress: Not on file  Social Connections: Not on file    Subjective: Review of Systems  Constitutional: Negative for chills, fever, malaise/fatigue and weight loss.  HENT: Negative for congestion and sore throat.   Respiratory: Negative for cough and shortness of breath.   Cardiovascular: Negative for chest pain and palpitations.  Gastrointestinal: Positive for abdominal pain and heartburn. Negative for blood in stool, diarrhea, melena, nausea  and vomiting.  Musculoskeletal: Negative for joint pain and myalgias.  Skin: Negative for rash.  Neurological: Negative for dizziness and weakness.  Endo/Heme/Allergies: Does not bruise/bleed easily.  Psychiatric/Behavioral: Negative for depression. The patient is not nervous/anxious.   All other systems reviewed and are negative.    Objective: BP (!) 147/78   Pulse 66   Temp (!) 96.6 F (35.9 C) (Temporal)   Ht 5\' 7"  (  1.702 m)   Wt 192 lb 12.8 oz (87.5 kg)   BMI 30.20 kg/m  Physical Exam Vitals and nursing note reviewed.  Constitutional:      General: She is not in acute distress.    Appearance: Normal appearance. She is well-developed. She is obese. She is not ill-appearing, toxic-appearing or diaphoretic.  HENT:     Head: Normocephalic and atraumatic.     Nose: No congestion or rhinorrhea.  Eyes:     General: No scleral icterus. Cardiovascular:     Rate and Rhythm: Normal rate and regular rhythm.     Heart sounds: Normal heart sounds.  Pulmonary:     Effort: Pulmonary effort is normal. No respiratory distress.     Breath sounds: Normal breath sounds.  Abdominal:     General: Bowel sounds are normal.     Palpations: Abdomen is soft. There is no hepatomegaly, splenomegaly or mass.     Tenderness: There is no abdominal tenderness. There is no guarding or rebound.     Hernia: No hernia is present.  Skin:    General: Skin is warm and dry.     Coloration: Skin is not jaundiced.     Findings: No rash.  Neurological:     General: No focal deficit present.     Mental Status: She is alert and oriented to person, place, and time.  Psychiatric:        Attention and Perception: Attention normal.        Mood and Affect: Mood normal.        Speech: Speech normal.        Behavior: Behavior normal.        Thought Content: Thought content normal.        Cognition and Memory: Cognition and memory normal.      Assessment:  Very pleasant 79 year old female presents for  follow-up on GERD and abdominal pain.  She states that since she has had her endoscopy with dilation her reflux has been difficult to manage.  This is been further complicated by ER visits wherein she is put on medications that were previously tried and failed.  Thus far she has tried omeprazole daily, omeprazole twice daily, Pepcid, Carafate.  She has not tried Protonix, lansoprazole, esomeprazole.  She continues to have symptoms.  No red flag/warning signs or symptoms.  GERD with abdominal pain: Epigastric/chest pain.  Currently on omeprazole once daily.  Had side effects when she was on twice daily.  Also had side effects with Dexilant (diarrhea) and Carafate.  ED visits x2-3 in the recent past with cardiac work-up negative each time.  Symptoms typically resolved with a GI cocktail.  At this point I will have her stop omeprazole and start Protonix 40 mg daily.  She feels she is sensitive to many medications and would like to start on a low-dose initially.  We will request a progress report in 2 to 4 weeks.  I have asked her to call for any side effects.  We will also provide options for breakthrough symptoms or for symptomatic management while her PPI is getting on board.   Plan: 1. Stop omeprazole 2. Start Protonix 40 mg once daily pursing in the morning 3. Progress report in 2 to 4 weeks 4. Mylanta, Maalox, Tums, Rolaids, etc. as needed for breakthrough 5. Call if any problems with medication 6. Follow-up in 3 months    Thank you for allowing [place] to participate in the care of Hailey [name]  Joie Bimler, AGNP-C Adult & Gerontological Nurse Practitioner [facility]   07/17/2020 10:59 AM   Disclaimer: This note was dictated with voice recognition software. Similar sounding words can inadvertently be transcribed and may not be corrected upon review.

## 2020-07-17 NOTE — Telephone Encounter (Signed)
Noted. Pt will call back in 2-4 weeks with a progress report. Allergy was added to pts allergy list.

## 2020-07-17 NOTE — Patient Instructions (Signed)
Your health issues we discussed today were:   GERD (heartburn/reflux) with abdominal pain/chest pain: 1. I am sorry you are still not feeling well 2. Stop taking omeprazole 3. I sent a prescription for Protonix 40 mg once a day.  Take this pursing in the morning on an empty stomach 4. Call us in 2 to 4 weeks and let us know if it is helping your symptoms 5. You can use Mylanta, Maalox, Tums, Rolaids, etc. for "breakthrough" symptoms or while you are waiting for the Protonix to become effective 6. Call us if you have any problems with your medications such as difficulty getting it filled or side effects 7. Call us for worsening or severe symptoms  Overall I recommend:  1. Continue your other current medications 2. Return for follow-up in 3 months 3. Call us for any questions or concerns   ---------------------------------------------------------------  I am glad you have gotten your COVID-19 vaccination!  Even though you are fully vaccinated you should continue to follow CDC and state/local guidelines.  ---------------------------------------------------------------   At [facility] we value your feedback. You may receive a survey about your visit today. Please share your experience as we strive to create trusting relationships with our patients to provide genuine, compassionate, quality care.  We appreciate your understanding and patience as we review any laboratory studies, imaging, and other diagnostic tests that are ordered as we care for you. Our office policy is 5 business days for review of these results, and any emergent or urgent results are addressed in a timely manner for your best interest. If you do not hear from our office in 1 week, please contact us.   We also encourage the use of MyChart, which contains your medical information for your review as well. If you are not enrolled in this feature, an access code is on this after visit summary for your  convenience. Thank you for allowing [place] to be involved in your care.  It was great to see you today!  I hope you have a Merry Christmas and [address]!!

## 2020-07-17 NOTE — Telephone Encounter (Signed)
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

## 2020-07-17 NOTE — Telephone Encounter (Signed)
Routing message to Eric Gill, NP 

## 2020-07-17 NOTE — Telephone Encounter (Signed)
Routing...

## 2020-07-17 NOTE — Telephone Encounter (Signed)
Opened in error. Drug allergy was added to allergy list.

## 2020-07-17 NOTE — Addendum Note (Signed)
Addended by: Delane Ginger, Rejoice Heatwole A on: 07/17/2020 01:56 PM   Modules accepted: Orders

## 2020-07-18 DIAGNOSIS — N1832 Chronic kidney disease, stage 3b: Secondary | ICD-10-CM | POA: Diagnosis not present

## 2020-07-18 DIAGNOSIS — I340 Nonrheumatic mitral (valve) insufficiency: Secondary | ICD-10-CM | POA: Diagnosis not present

## 2020-07-18 DIAGNOSIS — I10 Essential (primary) hypertension: Secondary | ICD-10-CM | POA: Diagnosis not present

## 2020-07-18 DIAGNOSIS — E042 Nontoxic multinodular goiter: Secondary | ICD-10-CM | POA: Diagnosis not present

## 2020-07-18 DIAGNOSIS — Z6829 Body mass index (BMI) 29.0-29.9, adult: Secondary | ICD-10-CM | POA: Diagnosis not present

## 2020-07-18 DIAGNOSIS — Z0001 Encounter for general adult medical examination with abnormal findings: Secondary | ICD-10-CM | POA: Diagnosis not present

## 2020-07-18 DIAGNOSIS — H02402 Unspecified ptosis of left eyelid: Secondary | ICD-10-CM | POA: Diagnosis not present

## 2020-07-18 DIAGNOSIS — E782 Mixed hyperlipidemia: Secondary | ICD-10-CM | POA: Diagnosis not present

## 2020-07-20 DIAGNOSIS — E042 Nontoxic multinodular goiter: Secondary | ICD-10-CM | POA: Diagnosis not present

## 2020-07-25 DIAGNOSIS — H52223 Regular astigmatism, bilateral: Secondary | ICD-10-CM | POA: Diagnosis not present

## 2020-07-25 DIAGNOSIS — H02051 Trichiasis without entropian right upper eyelid: Secondary | ICD-10-CM | POA: Diagnosis not present

## 2020-07-25 DIAGNOSIS — H5203 Hypermetropia, bilateral: Secondary | ICD-10-CM | POA: Diagnosis not present

## 2020-07-25 DIAGNOSIS — H02059 Trichiasis without entropian unspecified eye, unspecified eyelid: Secondary | ICD-10-CM | POA: Diagnosis not present

## 2020-07-25 DIAGNOSIS — H2513 Age-related nuclear cataract, bilateral: Secondary | ICD-10-CM | POA: Diagnosis not present

## 2020-07-25 DIAGNOSIS — H02055 Trichiasis without entropian left lower eyelid: Secondary | ICD-10-CM | POA: Diagnosis not present

## 2020-07-25 DIAGNOSIS — H524 Presbyopia: Secondary | ICD-10-CM | POA: Diagnosis not present

## 2020-07-31 ENCOUNTER — Telehealth: Payer: Self-pay | Admitting: Internal Medicine

## 2020-07-31 DIAGNOSIS — K219 Gastro-esophageal reflux disease without esophagitis: Secondary | ICD-10-CM

## 2020-07-31 NOTE — Telephone Encounter (Signed)
Patient called and said her nexium is not working and wants to know if she can increase

## 2020-08-01 MED ORDER — ESOMEPRAZOLE MAGNESIUM 40 MG PO CPDR: 40.0000 mg | DELAYED_RELEASE_CAPSULE | Freq: Two times a day (BID) | ORAL | 3 refills | Status: DC

## 2020-08-01 NOTE — Telephone Encounter (Signed)
Spoke with pt. Pt was notified that Nexium was increased to 40 mgs.

## 2020-08-01 NOTE — Telephone Encounter (Signed)
Pt's medication was changed to Nexium on 07/17/20 20 mg. Pt would like to increase Nexium 20 mg to 40 mg. Please advise.

## 2020-08-01 NOTE — Telephone Encounter (Signed)
Increased dose sent to pharmacy. Have her check in with [place] in 2-4 weeks and let us know how it's doing.

## 2020-08-01 NOTE — Addendum Note (Signed)
Addended by: Delane Ginger, Jordynn Marcella A on: 08/01/2020 10:41 AM   Modules accepted: Orders

## 2020-08-27 ENCOUNTER — Telehealth: Payer: Self-pay | Admitting: Adult Health

## 2020-08-27 DIAGNOSIS — R928 Other abnormal and inconclusive findings on diagnostic imaging of breast: Secondary | ICD-10-CM

## 2020-08-27 NOTE — Telephone Encounter (Signed)
Patient wants to know if her referral has been sent over for her mammogram. (one was put in on 02/13/2020). Clinical staff will follow up with patient.

## 2020-08-27 NOTE — Telephone Encounter (Signed)
Left message that mammogram is 2/15 at [facility] at 1:40 pm

## 2020-08-29 ENCOUNTER — Telehealth: Payer: Self-pay

## 2020-08-29 DIAGNOSIS — K2100 Gastro-esophageal reflux disease with esophagitis, without bleeding: Secondary | ICD-10-CM | POA: Diagnosis not present

## 2020-08-29 DIAGNOSIS — Z6829 Body mass index (BMI) 29.0-29.9, adult: Secondary | ICD-10-CM | POA: Diagnosis not present

## 2020-08-29 NOTE — Telephone Encounter (Signed)
Pt called wanting you to call her back, tried to help the pt but she wanted a call from [place]

## 2020-08-29 NOTE — Telephone Encounter (Signed)
Spoke with pt. Pt is taking Esomeprazole twice daily. Pt feels she's been having an allergic reaction to Esomeprazole. C/o face swelling and burning near nose, lips and checks. Pt has had a hx of allergic reactions with PPI's. Pt wants to discuss going back on Omeprazole  Spoke with Wynne Dust, NP. Pt is to d/c PPI and we will discuss after further review. Pt advised to take a otc Benadryl if needed. If pt has any tongue or throat swelling, she should go to the ED. Pt is aware.

## 2020-08-29 NOTE — Telephone Encounter (Signed)
Lmom, waiting on a return call.  

## 2020-08-30 DIAGNOSIS — C44319 Basal cell carcinoma of skin of other parts of face: Secondary | ICD-10-CM | POA: Diagnosis not present

## 2020-08-30 DIAGNOSIS — C44311 Basal cell carcinoma of skin of nose: Secondary | ICD-10-CM | POA: Diagnosis not present

## 2020-08-30 DIAGNOSIS — Z85828 Personal history of other malignant neoplasm of skin: Secondary | ICD-10-CM | POA: Diagnosis not present

## 2020-08-30 DIAGNOSIS — D0439 Carcinoma in situ of skin of other parts of face: Secondary | ICD-10-CM | POA: Diagnosis not present

## 2020-08-30 DIAGNOSIS — L821 Other seborrheic keratosis: Secondary | ICD-10-CM | POA: Diagnosis not present

## 2020-09-05 NOTE — Telephone Encounter (Signed)
I reviewed her chart. We are ok to restart omeprazole 20 mg daily. If she doesn't have any reactions, we can increase this to bid if needed.  Call with progress report in 2-3 weeks.

## 2020-09-05 NOTE — Telephone Encounter (Signed)
Noted spoke with pt. Pt will start Omeprazole 20 mg daily and call back in 2-3 weeks with a progress report.

## 2020-09-18 ENCOUNTER — Other Ambulatory Visit: Payer: Self-pay

## 2020-09-18 ENCOUNTER — Ambulatory Visit (HOSPITAL_COMMUNITY)
Admission: RE | Admit: 2020-09-18 | Discharge: 2020-09-18 | Disposition: A | Discharge status: Home / Self Care | Payer: Medicare PPO | Source: Ambulatory Visit | Attending: Adult Health | Admitting: Adult Health

## 2020-09-18 DIAGNOSIS — R928 Other abnormal and inconclusive findings on diagnostic imaging of breast: Secondary | ICD-10-CM | POA: Diagnosis not present

## 2020-09-18 DIAGNOSIS — R922 Inconclusive mammogram: Secondary | ICD-10-CM | POA: Diagnosis not present

## 2020-09-19 DIAGNOSIS — Z85828 Personal history of other malignant neoplasm of skin: Secondary | ICD-10-CM | POA: Diagnosis not present

## 2020-09-19 DIAGNOSIS — C44311 Basal cell carcinoma of skin of nose: Secondary | ICD-10-CM | POA: Diagnosis not present

## 2020-09-25 IMAGING — MG DIGITAL SCREENING BILATERAL MAMMOGRAM WITH TOMO AND CAD
6 of 10 series · 6 of 30 positions shown · non-contrast
Comparison: None.

ACR Breast Density Category a: The breast tissue is almost entirely
fatty.

CLINICAL DATA: Screening.

EXAM:
DIGITAL SCREENING BILATERAL MAMMOGRAM WITH TOMO AND CAD

[R MLO synth-2D (1 of 2)]
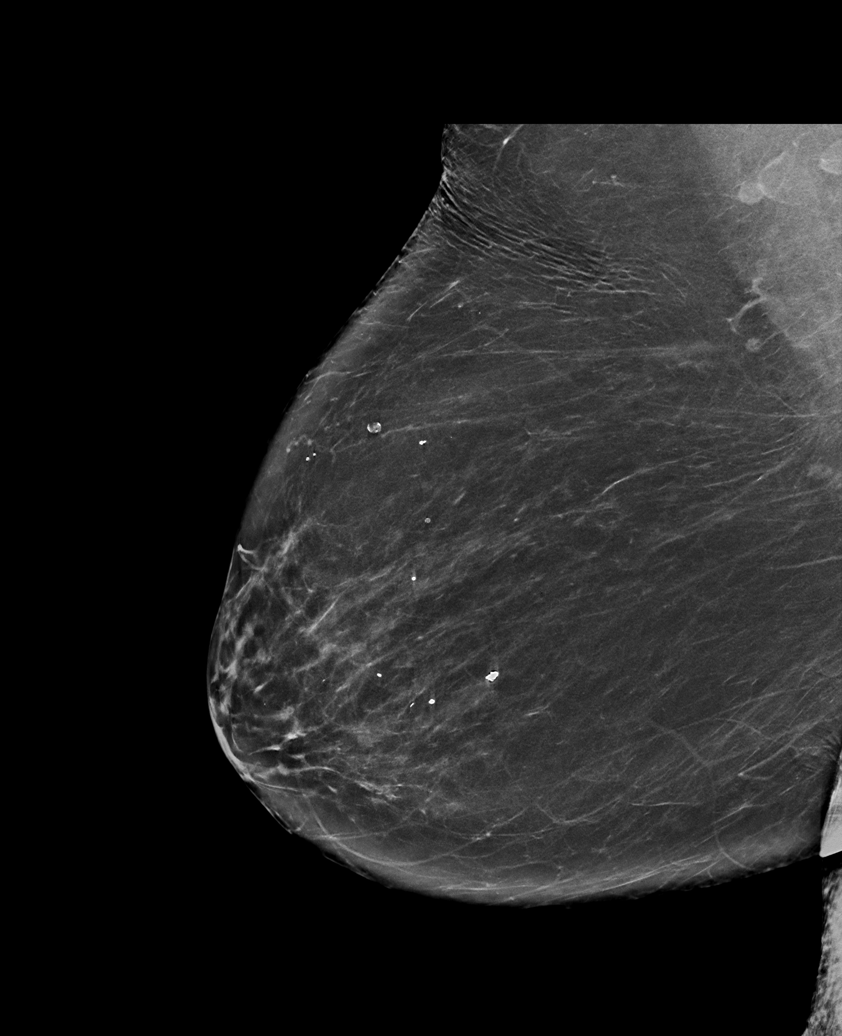

[L MLO synth-2D]
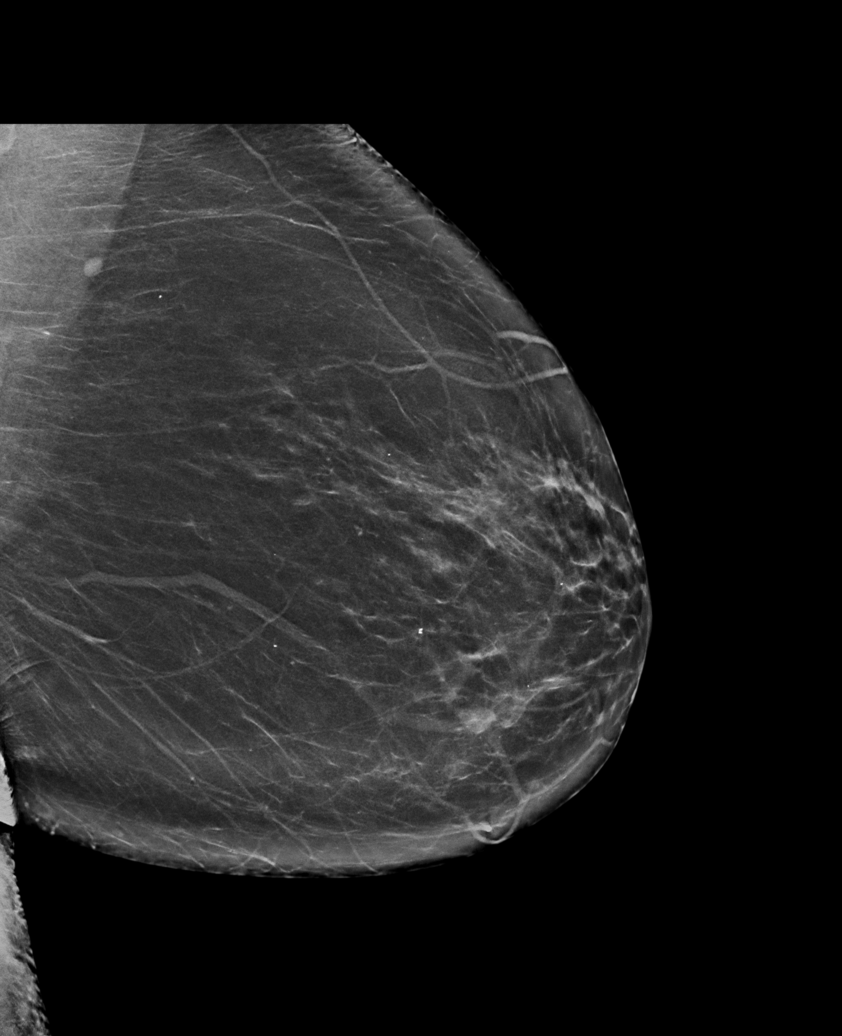

[R MLO synth-2D (2 of 2)]
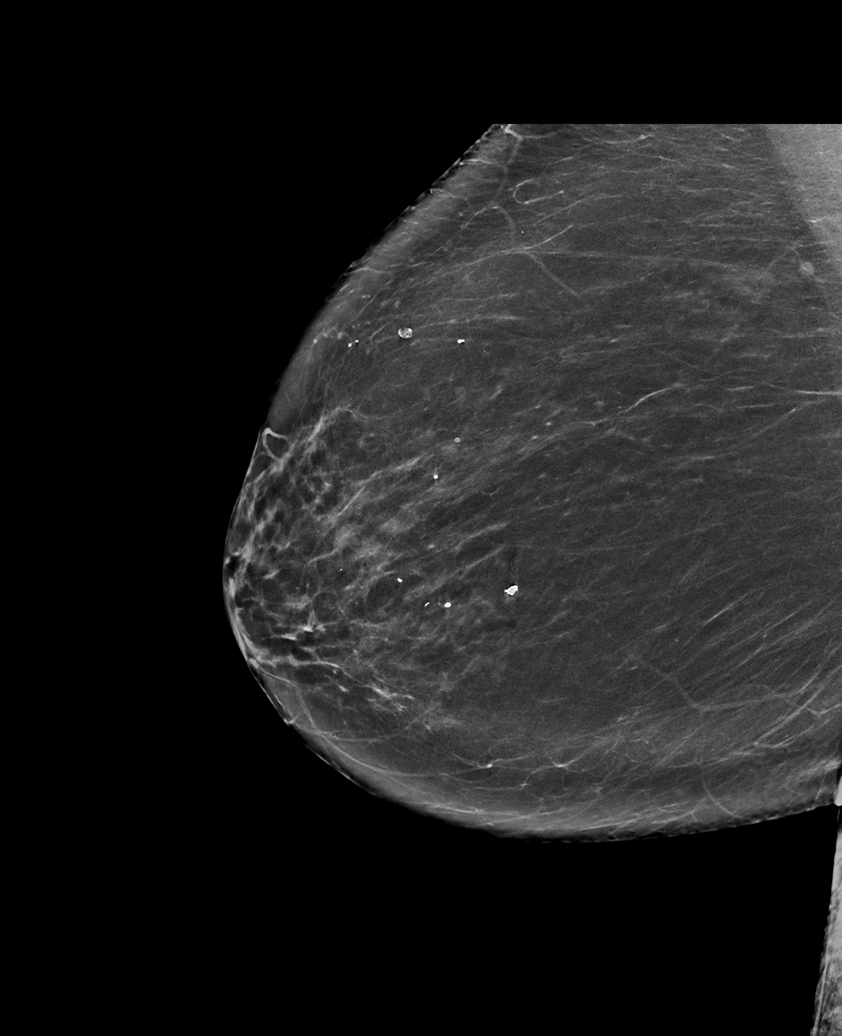

[L CC synth-2D]
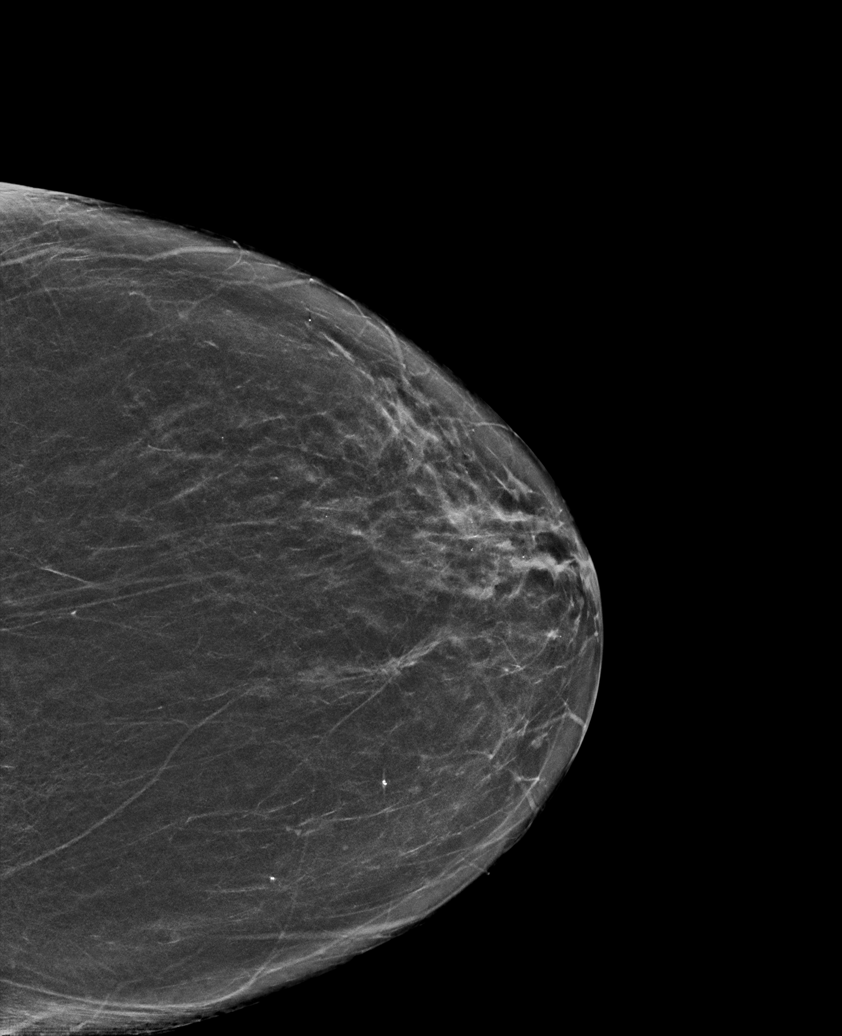

[R CC synth-2D]
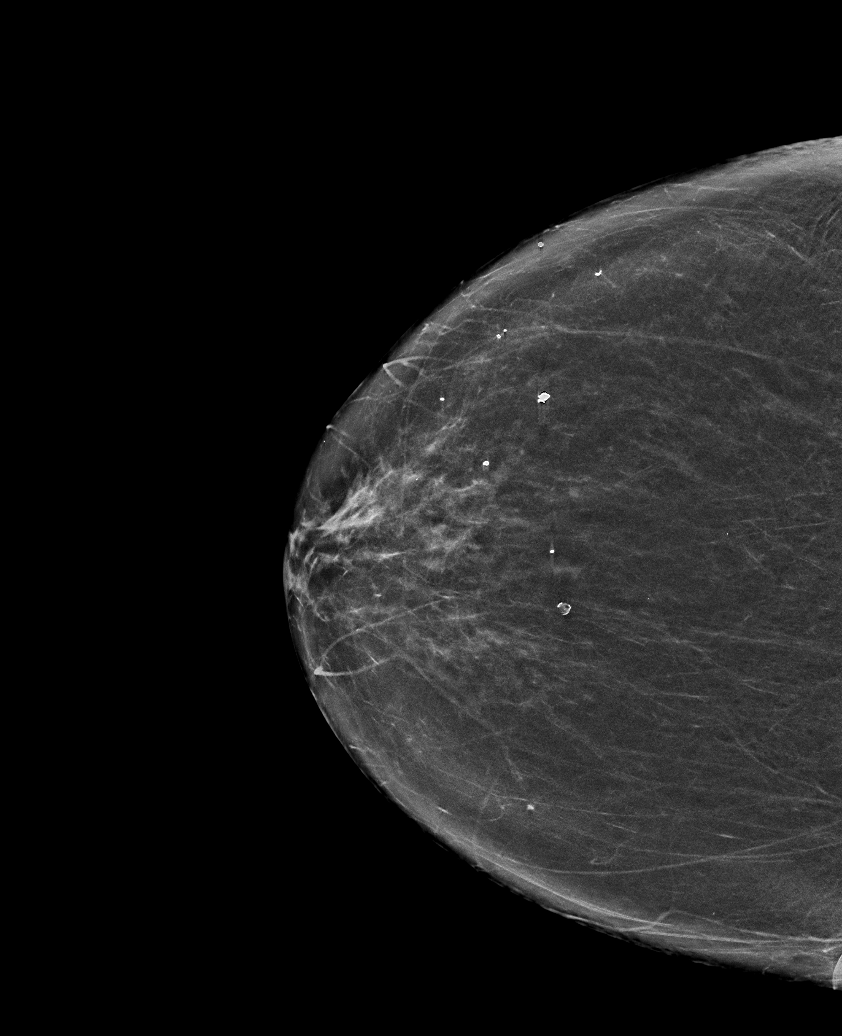

[L MLO tomo · tomo slice 38/75.0]
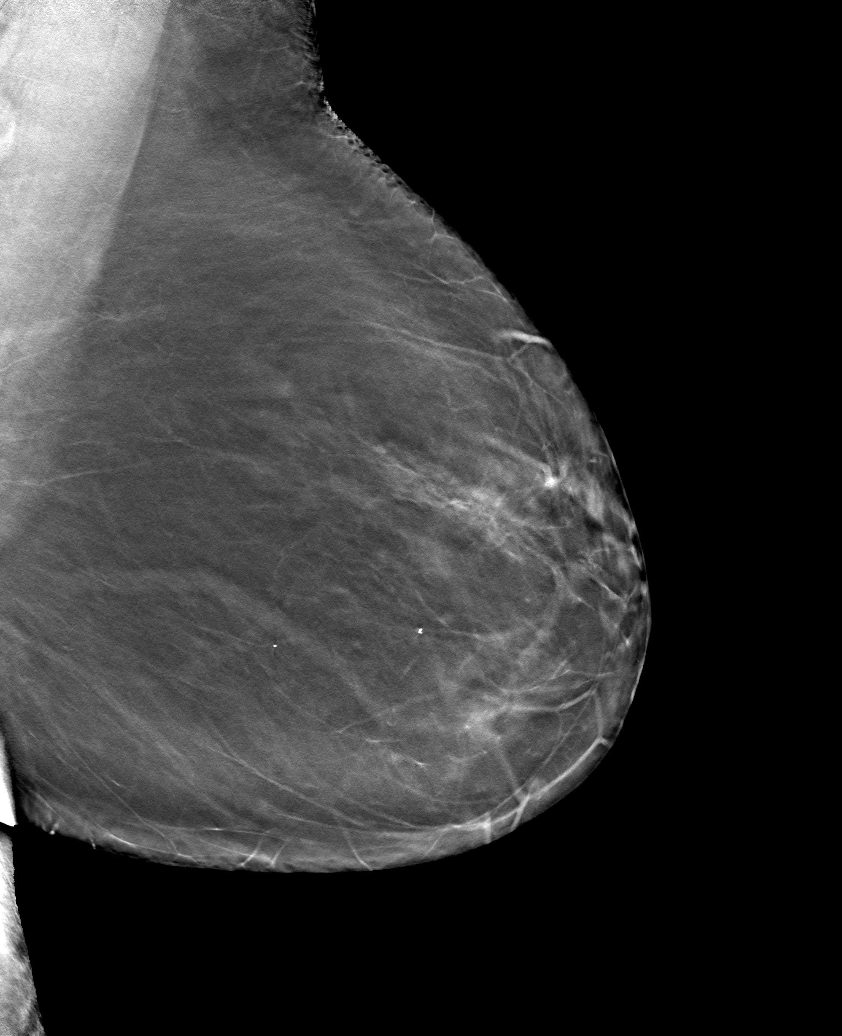

[6 of 30 positions shown; findings below may reference images not displayed]

FINDINGS: There are no findings suspicious for malignancy. Images were
processed with CAD.
IMPRESSION: No mammographic evidence of malignancy. A result letter of this
screening mammogram will be mailed directly to the patient.

RECOMMENDATION:
Screening mammogram in one year. (Code:[id number])

BI-RADS CATEGORY  1: Negative.

## 2020-09-28 ENCOUNTER — Emergency Department (HOSPITAL_COMMUNITY): Payer: Medicare PPO

## 2020-09-28 ENCOUNTER — Emergency Department (HOSPITAL_COMMUNITY)
Admission: EM | Admit: 2020-09-28 | Discharge: 2020-09-29 | Disposition: A | Discharge status: Home / Self Care | Payer: Medicare PPO | Attending: Emergency Medicine | Admitting: Emergency Medicine

## 2020-09-28 DIAGNOSIS — I2510 Atherosclerotic heart disease of native coronary artery without angina pectoris: Secondary | ICD-10-CM | POA: Diagnosis not present

## 2020-09-28 DIAGNOSIS — I10 Essential (primary) hypertension: Secondary | ICD-10-CM | POA: Diagnosis not present

## 2020-09-28 DIAGNOSIS — K219 Gastro-esophageal reflux disease without esophagitis: Secondary | ICD-10-CM | POA: Diagnosis not present

## 2020-09-28 DIAGNOSIS — R112 Nausea with vomiting, unspecified: Secondary | ICD-10-CM | POA: Diagnosis present

## 2020-09-28 DIAGNOSIS — R1111 Vomiting without nausea: Secondary | ICD-10-CM | POA: Diagnosis not present

## 2020-09-28 DIAGNOSIS — Z7901 Long term (current) use of anticoagulants: Secondary | ICD-10-CM | POA: Insufficient documentation

## 2020-09-28 DIAGNOSIS — R1110 Vomiting, unspecified: Secondary | ICD-10-CM | POA: Diagnosis not present

## 2020-09-28 DIAGNOSIS — R109 Unspecified abdominal pain: Secondary | ICD-10-CM | POA: Diagnosis not present

## 2020-09-28 DIAGNOSIS — R197 Diarrhea, unspecified: Secondary | ICD-10-CM | POA: Diagnosis not present

## 2020-09-28 DIAGNOSIS — Z20822 Contact with and (suspected) exposure to covid-19: Secondary | ICD-10-CM | POA: Insufficient documentation

## 2020-09-28 DIAGNOSIS — I4819 Other persistent atrial fibrillation: Secondary | ICD-10-CM | POA: Diagnosis not present

## 2020-09-28 DIAGNOSIS — K529 Noninfective gastroenteritis and colitis, unspecified: Secondary | ICD-10-CM | POA: Diagnosis not present

## 2020-09-28 DIAGNOSIS — I959 Hypotension, unspecified: Secondary | ICD-10-CM | POA: Diagnosis not present

## 2020-09-28 LAB — CBC WITH DIFFERENTIAL/PLATELET
Abs Immature Granulocytes: 0.02 10*3/uL (ref 0.00–0.07)
Basophils Absolute: 0 10*3/uL (ref 0.0–0.1)
Basophils Relative: 0 %
Eosinophils Absolute: 0 10*3/uL (ref 0.0–0.5)
Eosinophils Relative: 0 %
HCT: 40.4 % (ref 36.0–46.0)
Hemoglobin: 13.1 g/dL (ref 12.0–15.0)
Immature Granulocytes: 0 %
Lymphocytes Relative: 14 %
Lymphs Abs: 0.9 10*3/uL (ref 0.7–4.0)
MCH: 33.5 pg (ref 26.0–34.0)
MCHC: 32.4 g/dL (ref 30.0–36.0)
MCV: 103.3 fL — ABNORMAL HIGH (ref 80.0–100.0)
Monocytes Absolute: 0.4 10*3/uL (ref 0.1–1.0)
Monocytes Relative: 6 %
Neutro Abs: 5.2 10*3/uL (ref 1.7–7.7)
Neutrophils Relative %: 80 %
Platelets: 219 10*3/uL (ref 150–400)
RBC: 3.91 MIL/uL (ref 3.87–5.11)
RDW: 13.6 % (ref 11.5–15.5)
WBC: 6.6 10*3/uL (ref 4.0–10.5)
nRBC: 0 % (ref 0.0–0.2)

## 2020-09-28 LAB — TYPE AND SCREEN
ABO/RH(D): O POS
Antibody Screen: NEGATIVE

## 2020-09-28 LAB — COMPREHENSIVE METABOLIC PANEL
ALT: 25 U/L (ref 0–44)
AST: 30 U/L (ref 15–41)
Albumin: 3.4 g/dL — ABNORMAL LOW (ref 3.5–5.0)
Alkaline Phosphatase: 70 U/L (ref 38–126)
Anion gap: 8 (ref 5–15)
BUN: 22 mg/dL (ref 8–23)
CO2: 22 mmol/L (ref 22–32)
Calcium: 8.7 mg/dL — ABNORMAL LOW (ref 8.9–10.3)
Chloride: 107 mmol/L (ref 98–111)
Creatinine, Ser: 1.16 mg/dL — ABNORMAL HIGH (ref 0.44–1.00)
GFR, Estimated: 48 mL/min — ABNORMAL LOW (ref >60)
Glucose, Bld: 101 mg/dL — ABNORMAL HIGH (ref 70–99)
Potassium: 3.9 mmol/L (ref 3.5–5.1)
Sodium: 137 mmol/L (ref 135–145)
Total Bilirubin: 0.4 mg/dL (ref 0.3–1.2)
Total Protein: 6.3 g/dL — ABNORMAL LOW (ref 6.5–8.1)

## 2020-09-28 LAB — PROTIME-INR
INR: 1.2 (ref 0.8–1.2)
Prothrombin Time: 14.6 seconds (ref 11.4–15.2)

## 2020-09-28 LAB — LIPASE, BLOOD: Lipase: 20 U/L (ref 11–51)

## 2020-09-28 LAB — RESP PANEL BY RT-PCR (FLU A&B, COVID) ARPGX2
Influenza A by PCR: NEGATIVE
Influenza B by PCR: NEGATIVE
SARS Coronavirus 2 by RT PCR: NEGATIVE

## 2020-09-28 LAB — ABO/RH: ABO/RH(D): O POS

## 2020-09-28 LAB — POC OCCULT BLOOD, ED: Fecal Occult Bld: NEGATIVE

## 2020-09-28 MED ORDER — ONDANSETRON HCL 4 MG/2ML IJ SOLN
4.0000 mg | Freq: Once | INTRAMUSCULAR | Status: AC
  Administered 2020-09-28: 4 mg via INTRAVENOUS
  Filled 2020-09-28: qty 2

## 2020-09-28 NOTE — ED Triage Notes (Signed)
Pt. Arrived via EMS from home. Pt. Complains of Nausea, vomiting and diarrhea since 7 am today. Pt. States they took Imodium at 11:30 today.

## 2020-09-28 NOTE — ED Provider Notes (Signed)
Emergency Department Provider Note   I have reviewed the triage vital signs and the nursing notes.   HISTORY  Chief Complaint Nausea, Emesis, and Diarrhea   HPI Hailey [name] is a 80 y.o. female with past medical history reviewed below including atrial fibrillation on Eliquis  presents to the emergency department for evaluation of abdominal pain with vomiting and diarrhea.  Symptoms began last night with pain in the upper, mid abdomen around 2 AM.  She was able to get back to sleep and awoke at 7 AM with vomiting and multiple episodes of diarrhea.  She describes them as "gray/black" in color. No BRB. No fever or chills. She continues to have some abdominal discomfort. No sick contacts. She did complete a course of Doxycycline 5 days ago and completing a skin biopsy procedure on her nose without immediate complication. Denies any CP, SOB. No URI symptoms.   Past Medical History:  Diagnosis Date  . Atrial fibrillation (HCC)   . Breast nodule 11/16/2013  . H/O hematuria   . HLD (hyperlipidemia)   . Hypertension   . 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 Migraine headache   . Palpitations   . Restless leg syndrome   . Vaginal atrophy     Patient Active Problem List   Diagnosis Date Noted  . Abnormal mammogram of right breast 08/27/2020  . Dysphagia 04/17/2020  . GERD (gastroesophageal reflux disease) 11/29/2019  . Abdominal pain 11/29/2019  . Acquired thrombophilia (HCC) 06/22/2019  . Persistent atrial fibrillation (HCC) 05/11/2019  . Atrial flutter (HCC)   . Atrial fibrillation with RVR (HCC) 05/01/2019  . Essential hypertension   . Atrial fibrillation (HCC) 07/14/2018  . Vaginal dryness 01/28/2017  . Vaginal atrophy 01/28/2017  . Breast nodule 11/16/2013  . Breast mass, right 12/22/2012  . OVERWEIGHT 04/13/2009  . GENERALIZED ANXIETY DISORDER 04/13/2009  . Coronary artery disease involving native heart without angina pectoris 04/13/2009  . PALPITATIONS  04/03/2009    Past Surgical History:  Procedure Laterality Date  . APPENDECTOMY    . ATRIAL FIBRILLATION ABLATION N/A 08/20/2018   Procedure: ATRIAL FIBRILLATION ABLATION;  Surgeon: Regan Lemming, MD;  Location: [facility];  Service: Cardiovascular;  Laterality: N/A;  . ATRIAL FIBRILLATION ABLATION N/A 05/11/2019   Procedure: ATRIAL FIBRILLATION ABLATION;  Surgeon: Regan Lemming, MD;  Location: [facility] CV [facility];  Service: Cardiovascular;  Laterality: N/A;  . BIOPSY  06/06/2020   Procedure: BIOPSY;  Surgeon: Corbin Ade, MD;  Location: [facility];  Service: Endoscopy;;  gastric  . CARDIAC CATHETERIZATION  2005    cone  . CARDIOVERSION N/A 05/02/2019   Procedure: CARDIOVERSION;  Surgeon: Jodelle Red, MD;  Location: [facility] ENDOSCOPY;  Service: Cardiovascular;  Laterality: N/A;  . COLONOSCOPY    . ESOPHAGOGASTRODUODENOSCOPY N/A 06/06/2020   Procedure: ESOPHAGOGASTRODUODENOSCOPY (EGD);  Surgeon: Corbin Ade, MD;  Location: [facility];  Service: Endoscopy;  Laterality: N/A;  7:30am  . HERNIA REPAIR    . LEFT HEART CATHETERIZATION WITH CORONARY ANGIOGRAM N/A 04/12/2012   Procedure: LEFT HEART CATHETERIZATION WITH CORONARY ANGIOGRAM;  Surgeon: Kathleene Hazel, MD;  Location: [facility] CATH LAB;  Service: Cardiovascular;  Laterality: N/A;  . [name] DILATION N/A 06/06/2020   Procedure: [name] DILATION;  Surgeon: Corbin Ade, MD;  Location: [facility];  Service: Endoscopy;  Laterality: N/A;  . POLYPECTOMY  06/06/2020   Procedure: POLYPECTOMY;  Surgeon: Corbin Ade, MD;  Location: [facility];  Service: Endoscopy;;  gastric  . TOTAL ABDOMINAL HYSTERECTOMY      Allergies Amiodarone, Dexilant [dexlansoprazole], Flomax [tamsulosin hcl], and Pantoprazole sodium  Family History  Problem Relation Age of Onset  . Coronary artery disease Father        mid 54s  . Hypertension Mother   . Stroke Brother   . Transient ischemic attack Brother   . Other  Brother        on oxygen  . Hypertension Brother   . Stroke Brother   . Heart disease Brother   . Heart attack Brother   . Other Sister        a fib, restless leg  . Other Sister        pressure in eyes  . Colon cancer Neg Hx   . Gastric cancer Neg Hx   . Esophageal cancer Neg Hx     Social History Social History   Tobacco Use  . Smoking status: Never Smoker  . Smokeless tobacco: Never Used  Vaping Use  . Vaping Use: Never used  Substance Use Topics  . Alcohol use: Not Currently    Comment: rarely; glass of wine with dinner  . Drug use: No    Review of Systems  Constitutional: No fever/chills Eyes: No visual changes. ENT: No sore throat. Cardiovascular: Denies chest pain. Respiratory: Denies shortness of breath. Gastrointestinal: Positive abdominal pain. Positive nausea, vomiting, and diarrhea.  No constipation. Genitourinary: Negative for dysuria. Musculoskeletal: Negative for back pain. Skin: Negative for rash. Neurological: Negative for headaches, focal weakness or numbness.  10-point ROS otherwise negative.  ____________________________________________   PHYSICAL EXAM:  VITAL SIGNS: ED Triage Vitals [09/28/20 1908]  Enc Vitals Group     BP (!) 142/62     Pulse Rate 68     Resp 18     Temp 97.9 F (36.6 C)     Temp Source Oral     SpO2 98 %     Weight 192 lb 14.4 oz (87.5 kg)     Height 5\' 7"  (1.702 m)   Constitutional: Alert and oriented. Well appearing and in no acute distress. Eyes: Conjunctivae are normal. Head: Atraumatic. Nose: No congestion/rhinnorhea. Mouth/Throat: Mucous membranes are moist.  Neck: No stridor.   Cardiovascular: Normal rate, regular rhythm. Good peripheral circulation. Grossly normal heart sounds.   Respiratory: Normal respiratory effort.  No retractions. Lungs CTAB. Gastrointestinal: Soft with mild epigastric and LUQ tenderness. No rebound or guarding. No distention. Rectal exam performed with patient's verbal consent  and NT chaperone. No gross blood or melena. No acute findings externally.  Musculoskeletal: No gross deformities of extremities. Neurologic:  Normal speech and language. Skin:  Skin is warm, dry and intact. No rash noted.  ____________________________________________   LABS (all labs ordered are listed, but only abnormal results are displayed)  Labs Reviewed  COMPREHENSIVE METABOLIC PANEL - Abnormal; Notable for the following components:      Result Value   Glucose, Bld 101 (*)    Creatinine, Ser 1.16 (*)    Calcium 8.7 (*)    Total Protein 6.3 (*)    Albumin 3.4 (*)    GFR, Estimated 48 (*)    All other components within normal limits  CBC WITH DIFFERENTIAL/PLATELET - Abnormal; Notable for the following components:   MCV 103.3 (*)    All other components within normal limits  RESP PANEL BY RT-PCR (FLU A&B, COVID) ARPGX2  LIPASE, BLOOD  PROTIME-INR  POC OCCULT BLOOD, ED  TYPE AND  SCREEN  ABO/RH   ____________________________________________  RADIOLOGY  CT abdomen pelvis pending.  ____________________________________________   PROCEDURES  Procedure(s) performed:   Procedures  None ____________________________________________   INITIAL IMPRESSION / ASSESSMENT AND PLAN / ED COURSE  Pertinent labs & imaging results that were available during my care of the patient were reviewed by me and considered in my medical decision making (see chart for details).   Patient presents to the emergency department for evaluation of abdominal pain with vomiting and diarrhea.  Has recently been on antibiotics and so C. difficile ordered along with labs.  Patient describes a "gray/black" discoloration to the stool and emesis.  Patient is on Eliquis.  Plan for CT abdomen pelvis along with Hemoccult.   Hemoccult negative. Labs reassuring. CT pending. Care transferred to Dr. Judd Lien pending CT.  ____________________________________________  FINAL CLINICAL IMPRESSION(S) / ED  DIAGNOSES  Final diagnoses:  Acute gastroenteritis     MEDICATIONS GIVEN DURING THIS VISIT:  Medications  ondansetron (ZOFRAN) injection 4 mg (4 mg Intravenous Given 09/28/20 2003)  iohexol (OMNIPAQUE) 300 MG/ML solution 80 mL (80 mLs Intravenous Contrast Given 09/29/20 0106)     NEW OUTPATIENT MEDICATIONS STARTED DURING THIS VISIT:  Discharge Medication List as of 09/29/2020  1:49 AM    START taking these medications   Details  ondansetron (ZOFRAN ODT) 8 MG disintegrating tablet 8mg  ODT q4 hours prn nausea, Print        Note:  This document was prepared using Dragon voice recognition software and may include unintentional dictation errors.  , MD, [facility]    [place], [facility], MD 10/01/20 [phone]

## 2020-09-29 DIAGNOSIS — R1110 Vomiting, unspecified: Secondary | ICD-10-CM | POA: Diagnosis not present

## 2020-09-29 DIAGNOSIS — R109 Unspecified abdominal pain: Secondary | ICD-10-CM | POA: Diagnosis not present

## 2020-09-29 MED ORDER — IOHEXOL 300 MG/ML  SOLN
80.0000 mL | Freq: Once | INTRAMUSCULAR | Status: AC | PRN
  Administered 2020-09-29: 80 mL via INTRAVENOUS

## 2020-09-29 MED ORDER — ONDANSETRON 8 MG PO TBDP: ORAL_TABLET | ORAL | 0 refills | Status: DC

## 2020-09-29 NOTE — Discharge Instructions (Addendum)
Begin taking Zofran as prescribed as needed for nausea.  Return to the emergency department if you develop severe abdominal pain, high fever, bloody stools, or other new and concerning symptoms.    Follow-up with your primary doctor if symptoms are not improving in the next 2-3 days.

## 2020-09-29 NOTE — ED Notes (Addendum)
Patient transported to CT 

## 2020-09-29 NOTE — ED Provider Notes (Signed)
Care assumed from Dr. Jacqulyn Bath at shift change.  Patient presenting here with complaints of nausea, vomiting, and diarrhea since this morning.  Care signed out to me awaiting results of a CT scan of the abdomen and pelvis.  This test has returned and shows mild wispy mesenteric infiltration within the mesenteric root, but nothing emergent.  Patient appears clinically well with stable vital signs.  She is feeling better after fluids and medications given in the ER.  Abdomen reexamined and is benign.  I see no indication for admission and believe patient is appropriate for discharge.  Symptoms seem either viral or foodborne and I believe will be self-limited.   Geoffery Lyons, MD 09/29/20 [phone]

## 2020-10-16 ENCOUNTER — Ambulatory Visit: Payer: Medicare PPO | Admitting: Nurse Practitioner

## 2020-10-16 ENCOUNTER — Encounter: Payer: Self-pay | Admitting: Nurse Practitioner

## 2020-10-16 ENCOUNTER — Other Ambulatory Visit: Payer: Self-pay

## 2020-10-16 VITALS — BP 119/72 | HR 66 | Temp 97.0°F | Ht 67.0 in | Wt 192.2 lb

## 2020-10-16 DIAGNOSIS — R079 Chest pain, unspecified: Secondary | ICD-10-CM | POA: Diagnosis not present

## 2020-10-16 DIAGNOSIS — R1319 Other dysphagia: Secondary | ICD-10-CM | POA: Diagnosis not present

## 2020-10-16 DIAGNOSIS — K219 Gastro-esophageal reflux disease without esophagitis: Secondary | ICD-10-CM

## 2020-10-16 MED ORDER — LANSOPRAZOLE 30 MG PO CPDR: 30.0000 mg | DELAYED_RELEASE_CAPSULE | Freq: Two times a day (BID) | ORAL | 3 refills | Status: DC

## 2020-10-16 NOTE — Patient Instructions (Addendum)
Your health issues we discussed today were:   GERD (reports/heartburn): 1. I sent a prescription for lansoprazole 30 mg to your pharmacy 2. Stop taking omeprazole and start taking lansoprazole (Prevacid) 30 mg twice a day 3. Continue taking Tums for 5 days with the lansoprazole, then stop taking Tums 4. If you have worsening symptoms on lansoprazole then you can stop lansoprazole and restart your omeprazole 20 mg twice a day with Tums as needed 5. Call our office if you do not have any success with lansoprazole and we can try the last medication you have not tried (rabeprazole or AcipHex)  Dysphagia (swallowing difficulties): 1. Try to avoid foods that tend to cause a problem (such as double cheeseburger from [employer]) 2. Make sure you chew your food thoroughly, take small bites, keep plenty of water on hand 3. Continue to monitor and if you have recurrent dysphagia/swallowing difficulties let us know  Overall I recommend:  1. Continue other current medications 2. Return for follow-up in 2 months 3. Call us for any questions or concerns   ---------------------------------------------------------------  I am glad you have gotten your COVID-19 vaccination!  Even though you are fully vaccinated you should continue to follow CDC and state/local guidelines.  ---------------------------------------------------------------   At [facility] we value your feedback. You may receive a survey about your visit today. Please share your experience as we strive to create trusting relationships with our patients to provide genuine, compassionate, quality care.  We appreciate your understanding and patience as we review any laboratory studies, imaging, and other diagnostic tests that are ordered as we care for you. Our office policy is 5 business days for review of these results, and any emergent or urgent results are addressed in a timely manner for your best interest. If you do not  hear from our office in 1 week, please contact us.   We also encourage the use of MyChart, which contains your medical information for your review as well. If you are not enrolled in this feature, an access code is on this after visit summary for your convenience. Thank you for allowing [place] to be involved in your care.  It was great to see you today!  I hope you have a great spring!!

## 2020-10-16 NOTE — Progress Notes (Signed)
Referring Provider: Richardean Chimera, MD Primary Care Physician:  Richardean Chimera, MD Primary GI:  Dr. Jena Gauss  Chief Complaint  Patient presents with  . Gastroesophageal Reflux    C/o chest pain daily, takes tums and it helps. She has placed herself on omeprazole OTC 20mg  twice a day    HPI:   Hailey [name] is a 80 y.o. female who presents for 41-month follow-up.  The patient was last seen in our office 07/17/2020 for epigastric pain, GERD, dysphagia.  Noted previous cardiac work-up for chest pain which was negative.  Longstanding chronic history of GERD.  Recent attempt at Carafate with side effects, declining reflux surgery.  Seen in the emergency department August 2021 for chest pain deemed likely GERD and resolution after GI cocktail.  Previously on omeprazole 40 mg twice a day which caused side effects that she decrease to once a day.  EGD up-to-date which found normal esophagus, a few pedunculated and sessile polyps in the gastric body, esophagus status post dilation, polypectomy.  Recommended stop omeprazole and trial Dexilant.  She called our office noting a painful tongue since starting Dexilant and recommended empiric treatment for thrush with Dukes mixture or Magic mouthwash with nystatin swish and swallow 10 cc, 3 times a day for 5 days.  She called a week later indicating diarrhea with Dexilant.  Apparent intolerance to multiple PPIs.  Recommended stop Dexilant and start over-the-counter Pepcid Complete and Imodium with calcium carbonate 1 to 2 tablets twice a day.  Finish nystatin follow-up.  She again called our office 06/28/2019 [place] indicating her diarrhea resolved when she stopped Dexilant and started Imodium.  However, she has now having weakness since starting Pepcid.  She also had her Covid booster the previous week.  Recommended calling primary care.  Seen in the emergency department 06/27/2020 for typical chest pain and GERD noted to be burning epigastric pain  worsening and intermittent, unremarkable cardiac work-up and felt likely again persistent GERD.  Her medication was switched back to omeprazole and added Carafate.  In summary, she has tried/failed Omeprazole (minimally effective at low dose, ADEs at high dose), Dexilant (ADEs), Pepcid (ineffective), Carafate (ineffective and ADEs).  At that time had not tried pantoprazole, esomeprazole, or lansoprazole.  At her last visit she noted still with epigastric pain despite omeprazole.  States she is sensitive to a lot of medications and wants to start on a low dose.  Her pain is epigastric burning in her chest, status post esophageal dilation which may be contributing.  No recurrent breakthrough dysphagia.  No other overt GI complaints.  Recommended stop omeprazole and start pantoprazole 40 mg daily, partial for 2 to 4 weeks, Mylanta, Maalox, Tums, Rolaids, etc. as needed for breakthrough, follow-up in 3 months.  She came in to our office later that day and states she cannot take pantoprazole because it turns her red.  Indicated we would add to her allergy list.  Instead recommended esomeprazole 20 mg daily and progress report in 2 to 4 weeks.  She called our office 07/23/2020 indicating Nexium is not working and recommended increasing to 40 mg with a partial for 2 to 4 weeks.  She called our office 08/29/2020 indicating she is having an allergic reaction to esomeprazole with face swelling and burning near her nose, lips, cheeks.  She wanted to discuss going back on omeprazole.  Recommended stopping esomeprazole, take over-the-counter Benadryl if needed.  If any tongue or throat swelling go to the emergency department.  Gave the okay to again try to restart omeprazole 20 mg daily and can increase to twice a day if needed.  No further communication from the patient.  Today she states she is doing okay overall.  She was previously prescribed omeprazole 20 mg daily and indicated we can increase this to twice a  day if needed.  At some point she started taking over-the-counter omeprazole 20 mg twice a day (second dose is OTC).  She also uses Tums regularly, which she feels helps. Still with some chest pain daily, but overall feels she's a lot improved. Not hurting right now. However, due to daily symptoms she is wanting to try to get improvement. Hasn't tried Prevacid or Aciphex. Yet. Denies abdominal pain, N/V, hematochezia, melena, fever, chills, unintentional weight loss. Denies URI or flu-like symptoms. Denies loss of sense of taste or smell. The patient has received COVID-19 vaccination(s). They have had a bosster dose as well. Denies chest pain, dyspnea, dizziness, lightheadedness, syncope, near syncope. Denies any other upper or lower GI symptoms.  Past Medical History:  Diagnosis Date  . Atrial fibrillation (HCC)   . Breast nodule 11/16/2013  . H/O hematuria   . HLD (hyperlipidemia)   . Hypertension   . 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 Migraine headache   . Palpitations   . Restless leg syndrome   . Vaginal atrophy     Past Surgical History:  Procedure Laterality Date  . APPENDECTOMY    . ATRIAL FIBRILLATION ABLATION N/A 08/20/2018   Procedure: ATRIAL FIBRILLATION ABLATION;  Surgeon: Regan Lemming, MD;  [facility];  Service: Cardiovascular;  Laterality: N/A;  . ATRIAL FIBRILLATION ABLATION N/A 05/11/2019   Procedure: ATRIAL FIBRILLATION ABLATION;  Surgeon: Regan Lemming, MD;  Location: [facility];  Service: Cardiovascular;  Laterality: N/A;  . BIOPSY  06/06/2020   Procedure: BIOPSY;  Surgeon: Corbin Ade, MD;  [facility]: [facility];  Service: Endoscopy;;  gastric  . CARDIAC CATHETERIZATION  2005    cone  . CARDIOVERSION N/A 05/02/2019   Procedure: CARDIOVERSION;  Surgeon: Jodelle Red, MD;  Location[facility] ENDOSCOPY;  Service: Cardiovascular;  Laterality: N/A;  . COLONOSCOPY    . ESOPHAGOGASTRODUODENOSCOPY N/A 06/06/2020    Procedure: ESOPHAGOGASTRODUODENOSCOPY (EGD);  Surgeon: Corbin Ade, MD;  [facility];  Service: Endoscopy;  Laterality: N/A;  7:30am  . HERNIA REPAIR    . LEFT HEART CATHETERIZATION WITH CORONARY ANGIOGRAM N/A 04/12/2012   Procedure: LEFT HEART CATHETERIZATION WITH CORONARY ANGIOGRAM;  Surgeon: Kathleene Hazel, MD;  Location: [facility] CATH LAB;  Service: Cardiovascular;  Laterality: N/A;  . [name] DILATION N/A 06/06/2020   Procedure: [name] DILATION;  Surgeon: Corbin Ade, MD;  [facility];  Service: Endoscopy;  Laterality: N/A;  . POLYPECTOMY  06/06/2020   Procedure: POLYPECTOMY;  Surgeon: Corbin Ade, MD;  [facility];  Service: Endoscopy;;  gastric  . TOTAL ABDOMINAL HYSTERECTOMY      Current Outpatient Medications  Medication Sig Dispense Refill  . acetaminophen (TYLENOL) 650 MG CR tablet Take 1,300 mg by mouth 2 (two) times daily.    . Alum & Mag Hydroxide-Simeth (MYLANTA PO) Take by mouth as needed.    Marland Kitchen apixaban (ELIQUIS) 5 MG TABS tablet Take 5 mg by mouth 2 (two) times daily.    . Calcium Carb-Cholecalciferol (CALCIUM 600+D3 PO) Take 1 tablet by mouth daily.     . calcium carbonate (TUMS -  DOSED IN MG ELEMENTAL CALCIUM) 500 MG chewable tablet Chew 1 tablet by mouth as needed for indigestion or heartburn.     . diltiazem (CARDIZEM CD) 180 MG 24 hr capsule TAKE 1 CAPSULE BY MOUTH EVERY DAY 90 capsule 3  . flecainide (TAMBOCOR) 50 MG tablet Take 1 tablet (50 mg total) by mouth 2 (two) times daily. 180 tablet 3  . fluticasone (FLONASE) 50 MCG/ACT nasal spray Place 2 sprays into both nostrils daily as needed for allergies or rhinitis.     Marland Kitchen. gabapentin (NEURONTIN) 100 MG capsule Take 100 mg by mouth at bedtime.    . Multiple Vitamin (MULTIVITAMIN WITH MINERALS) TABS tablet Take 1 tablet by mouth daily. Centrum Silver for Women    . omeprazole (PRILOSEC) 20 MG capsule Take 20 mg by mouth 2 (two) times daily before a meal.    . ondansetron  (ZOFRAN ODT) 8 MG disintegrating tablet 8mg  ODT q4 hours prn nausea 8 tablet 0  . Polyethyl Glycol-Propyl Glycol (SYSTANE OP) Place 1 drop into both eyes 2 (two) times daily as needed (dry eyes).     No current facility-administered medications for this visit.    Allergies as of 10/16/2020 - Review Complete 10/16/2020  Allergen Reaction Noted  . Amiodarone Itching 04/24/2018  . Dexilant [dexlansoprazole] Diarrhea 07/17/2020  . Flomax [tamsulosin hcl] Nausea And Vomiting 01/24/2015  . Pantoprazole sodium Other (See Comments) 07/17/2020    Family History  Problem Relation Age of Onset  . Coronary artery disease Father        mid 5450s  . Hypertension Mother   . Stroke Brother   . Transient ischemic attack Brother   . Other Brother        on oxygen  . Hypertension Brother   . Stroke Brother   . Heart disease Brother   . Heart attack Brother   . Other Sister        a fib, restless leg  . Other Sister        pressure in eyes  . Colon cancer Neg Hx   . Gastric cancer Neg Hx   . Esophageal cancer Neg Hx     Social History   Socioeconomic History  . Marital status: Single    Spouse name: Not on file  . Number of children: 0  . Years of education: College  . Highest education level: Not on file  Occupational History  . Occupation: retired  Tobacco Use  . Smoking status: Never Smoker  . Smokeless tobacco: Never Used  Vaping Use  . Vaping Use: Never used  Substance and Sexual Activity  . Alcohol use: Not Currently    Comment: rarely; glass of wine with dinner  . Drug use: No  . Sexual activity: Never    Birth control/protection: Surgical    Comment: hyst  Other Topics Concern  . Not on file  Social History Narrative   Single. Retired.    Patient lives at home alone.   Caffeine Use: none   Social Determinants of Corporate investment bankerHealth   Financial Resource Strain: Not on file  Food Insecurity: Not on file  Transportation Needs: Not on file  Physical Activity: Not on file   Stress: Not on file  Social Connections: Not on file    Subjective: Review of Systems  Constitutional: Negative for chills, fever, malaise/fatigue and weight loss.  HENT: Negative for congestion and sore throat.   Respiratory: Negative for cough and shortness of breath.   Cardiovascular: Negative for chest pain  and palpitations.  Gastrointestinal: Positive for heartburn. Negative for abdominal pain, blood in stool, constipation, diarrhea, melena, nausea and vomiting.  Musculoskeletal: Negative for joint pain and myalgias.  Skin: Negative for rash.  Neurological: Negative for dizziness and weakness.  Endo/Heme/Allergies: Does not bruise/bleed easily.  Psychiatric/Behavioral: Negative for depression. The patient is not nervous/anxious.   All other systems reviewed and are negative.    Objective: BP 119/72   Pulse 66   Temp (!) 97 F (36.1 C)   Ht 5\' 7"  (1.702 m)   Wt 192 lb 3.2 oz (87.2 kg)   BMI 30.10 kg/m  Physical Exam Vitals and nursing note reviewed.  Constitutional:      General: She is not in acute distress.    Appearance: Normal appearance. She is well-developed. She is obese. She is not ill-appearing, toxic-appearing or diaphoretic.  HENT:     Head: Normocephalic and atraumatic.     Nose: No congestion or rhinorrhea.  Eyes:     General: No scleral icterus. Cardiovascular:     Rate and Rhythm: Normal rate and regular rhythm.     Heart sounds: Normal heart sounds.  Pulmonary:     Effort: Pulmonary effort is normal. No respiratory distress.     Breath sounds: Normal breath sounds.  Abdominal:     General: Bowel sounds are normal.     Palpations: Abdomen is soft. There is no hepatomegaly, splenomegaly or mass.     Tenderness: There is no abdominal tenderness. There is no guarding or rebound.     Hernia: No hernia is present.  Skin:    General: Skin is warm and dry.     Coloration: Skin is not jaundiced.     Findings: No rash.  Neurological:     General: No  focal deficit present.     Mental Status: She is alert and oriented to person, place, and time.  Psychiatric:        Attention and Perception: Attention normal.        Mood and Affect: Mood normal.        Speech: Speech normal.        Behavior: Behavior normal.        Thought Content: Thought content normal.        Cognition and Memory: Cognition and memory normal.      Assessment:  Pleasant 80 year old female with persistent, insidious GERD, previous dysphagia, chest pain related to GERD (multiple work-ups for cardiology etiology found to be negative).  She is doing somewhat better currently.  She has tried and failed multiple PPIs.  She is currently on omeprazole 20 mg twice a day.  However, she still requires Tums pretty regularly (daily).  At this point she has not tried lansoprazole or rabeprazole.  I will have her start lansoprazole 30 mg twice a day, stop omeprazole, continue to use Tums for 5 to 7 days then try to stop Tums and see if lansoprazole works any better.  If not, she can go back on the omeprazole which is at least working somewhat.  Dysphagia symptoms significantly improved after dilation in the past.  Recommend she continue to take small bites chew thoroughly.  She only has an issue if she eats a double cheeseburger from 96.  Recommended avoiding foods that tend to get stuck.  Continue to monitor and if progressive symptoms/persistent symptoms and we can consider repeat EGD with dilation.   Plan: 1. Stop omeprazole 2. Start lansoprazole 30 mg twice a day 3. Continue  Tums for 5 days then trial stopping Tums 4. If worsening on lansoprazole, can restart omeprazole and Tums and call our office 5. Follow-up in 3 months    Thank you for allowing [place] to participate in the care of Jenniger M Petrey  Wynne Dust, DNP, AGNP-C Adult & Gerontological Nurse Practitioner [facility]   10/16/2020 3:35 PM   Disclaimer: This note was dictated with  voice recognition software. Similar sounding words can inadvertently be transcribed and may not be corrected upon review.

## 2020-10-23 DIAGNOSIS — L814 Other melanin hyperpigmentation: Secondary | ICD-10-CM | POA: Diagnosis not present

## 2020-10-23 DIAGNOSIS — L821 Other seborrheic keratosis: Secondary | ICD-10-CM | POA: Diagnosis not present

## 2020-10-23 DIAGNOSIS — Z85828 Personal history of other malignant neoplasm of skin: Secondary | ICD-10-CM | POA: Diagnosis not present

## 2020-10-23 DIAGNOSIS — L570 Actinic keratosis: Secondary | ICD-10-CM | POA: Diagnosis not present

## 2020-10-23 DIAGNOSIS — L820 Inflamed seborrheic keratosis: Secondary | ICD-10-CM | POA: Diagnosis not present

## 2020-10-23 DIAGNOSIS — D485 Neoplasm of uncertain behavior of skin: Secondary | ICD-10-CM | POA: Diagnosis not present

## 2020-11-08 DIAGNOSIS — Z1329 Encounter for screening for other suspected endocrine disorder: Secondary | ICD-10-CM | POA: Diagnosis not present

## 2020-11-08 DIAGNOSIS — N183 Chronic kidney disease, stage 3 unspecified: Secondary | ICD-10-CM | POA: Diagnosis not present

## 2020-11-08 DIAGNOSIS — E782 Mixed hyperlipidemia: Secondary | ICD-10-CM | POA: Diagnosis not present

## 2020-11-08 DIAGNOSIS — K2100 Gastro-esophageal reflux disease with esophagitis, without bleeding: Secondary | ICD-10-CM | POA: Diagnosis not present

## 2020-11-08 DIAGNOSIS — I10 Essential (primary) hypertension: Secondary | ICD-10-CM | POA: Diagnosis not present

## 2020-11-08 DIAGNOSIS — E7849 Other hyperlipidemia: Secondary | ICD-10-CM | POA: Diagnosis not present

## 2020-11-08 DIAGNOSIS — R5382 Chronic fatigue, unspecified: Secondary | ICD-10-CM | POA: Diagnosis not present

## 2020-11-14 ENCOUNTER — Encounter: Payer: Self-pay | Admitting: Gastroenterology

## 2020-11-14 DIAGNOSIS — E7849 Other hyperlipidemia: Secondary | ICD-10-CM | POA: Diagnosis not present

## 2020-11-14 DIAGNOSIS — I10 Essential (primary) hypertension: Secondary | ICD-10-CM | POA: Diagnosis not present

## 2020-11-14 DIAGNOSIS — N1832 Chronic kidney disease, stage 3b: Secondary | ICD-10-CM | POA: Diagnosis not present

## 2020-11-14 DIAGNOSIS — I4821 Permanent atrial fibrillation: Secondary | ICD-10-CM | POA: Diagnosis not present

## 2020-11-14 DIAGNOSIS — H02402 Unspecified ptosis of left eyelid: Secondary | ICD-10-CM | POA: Diagnosis not present

## 2020-11-14 DIAGNOSIS — Z23 Encounter for immunization: Secondary | ICD-10-CM | POA: Diagnosis not present

## 2020-11-14 DIAGNOSIS — E042 Nontoxic multinodular goiter: Secondary | ICD-10-CM | POA: Diagnosis not present

## 2020-11-14 DIAGNOSIS — I340 Nonrheumatic mitral (valve) insufficiency: Secondary | ICD-10-CM | POA: Diagnosis not present

## 2020-11-29 ENCOUNTER — Other Ambulatory Visit (HOSPITAL_COMMUNITY): Payer: Self-pay | Admitting: Adult Health

## 2020-11-29 DIAGNOSIS — N6489 Other specified disorders of breast: Secondary | ICD-10-CM

## 2020-12-04 ENCOUNTER — Other Ambulatory Visit: Payer: Self-pay

## 2020-12-04 ENCOUNTER — Encounter: Payer: Self-pay | Admitting: Cardiology

## 2020-12-04 ENCOUNTER — Ambulatory Visit: Payer: Medicare PPO | Admitting: Cardiology

## 2020-12-04 VITALS — BP 130/72 | HR 65 | Ht 67.0 in | Wt 191.0 lb

## 2020-12-04 DIAGNOSIS — I4819 Other persistent atrial fibrillation: Secondary | ICD-10-CM

## 2020-12-04 DIAGNOSIS — R0602 Shortness of breath: Secondary | ICD-10-CM | POA: Diagnosis not present

## 2020-12-04 NOTE — Patient Instructions (Signed)
Medication Instructions:  Your physician recommends that you continue on your current medications as directed. Please refer to the Current Medication list given to you today.  *If you need a refill on your cardiac medications before your next appointment, please call your pharmacy*   Lab Work: Today: BNP If you have labs (blood work) drawn today and your tests are completely normal, you will receive your results only by: Marland Kitchen MyChart Message (if you have MyChart) OR . A paper copy in the mail If you have any lab test that is abnormal or we need to change your treatment, we will call you to review the results.   Testing/Procedures: None ordered   Follow-Up: At [facility], you and your health needs are our priority.  As part of our continuing mission to provide you with exceptional heart care, we have created designated Provider Care Teams.  These Care Teams include your primary Cardiologist (physician) and Advanced Practice Providers (APPs -  Physician Assistants and Nurse Practitioners) who all work together to provide you with the care you need, when you need it.  Your next appointment:   6 month(s)  The format for your next appointment:   In Person  Provider:   Loman Brooklyn, MD    Thank you for choosing [facility]!!   Dory Horn, RN [phone]

## 2020-12-04 NOTE — Progress Notes (Signed)
Electrophysiology Office Note   Date:  12/04/2020   ID:  Hailey [name], DOB [birth year], MRN [id number]  PCP:  Richardean Chimera, MD  Cardiologist: Purvis Sheffield Primary Electrophysiologist:  Mariame Rybolt Jorja Loa, MD    No chief complaint on file.    History of Present Illness: Hailey [name] is a 80 y.o. female who is being seen today for the evaluation of atrial fibrillation at the request of Richardean Chimera, MD. Presenting today for electrophysiology evaluation.    She has a history of atrial fibrillation.  She is status post ablation x2, most recently 05/11/2019.  She had more episodes of atrial fibrillation and is now on flecainide.  She does have a history of syncope due to bradycardia and thus her beta-blocker was stopped.  Today, denies symptoms of palpitations, chest pain,  orthopnea, PND, lower extremity edema, claudication, dizziness, presyncope, syncope, bleeding, or neurologic sequela. The patient is tolerating medications without difficulties.  She has not noted any further atrial fibrillation.  Unfortunately, she has gotten more short of breath over the last few months.  She is short of breath mainly with exertion which is relieved with rest.  Her symptoms began gradually.  She cannot think of a specific time that this would have happened.   Past Medical History:  Diagnosis Date  . Atrial fibrillation (HCC)   . Breast nodule 11/16/2013  . H/O hematuria   . HLD (hyperlipidemia)   . Hypertension   . 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 Migraine headache   . Palpitations   . Restless leg syndrome   . Vaginal atrophy    Past Surgical History:  Procedure Laterality Date  . APPENDECTOMY    . ATRIAL FIBRILLATION ABLATION N/A 08/20/2018   Procedure: ATRIAL FIBRILLATION ABLATION;  Surgeon: Regan Lemming, MD;  [facility]: [facility] INVAS[facility] CV LAB;  Service: Cardiovascular;  Laterality: N/A;  . ATRIAL FIBRILLATION ABLATION N/A 05/11/2019   Procedure: ATRIAL FIBRILLATION  ABLATION;  Surgeon: Regan Lemming, MD;  Location: [facility] INVASIVE CV LAB;  Service: Cardiovascular;  Laterality: N/A;  . BIOPSY  06/06/2020   Procedure: BIOPSY;  Surgeon: Corbin Ade, MD;  [facility];  Service: Endoscopy;;  gastric  . CARDIAC CATHETERIZATION  2005    cone  . CARDIOVERSION N/A 05/02/2019   Procedure: CARDIOVERSION;  Surgeon: Jodelle Red, MD;  Location: [facility] ENDOSCOPY;  Service: Cardiovascular;  Laterality: N/A;  . COLONOSCOPY    . ESOPHAGOGASTRODUODENOSCOPY N/A 06/06/2020   Procedure: ESOPHAGOGASTRODUODENOSCOPY (EGD);  Surgeon: Corbin Ade, MD;  [facility];  Service: Endoscopy;  Laterality: N/A;  7:30am  . HERNIA REPAIR    . LEFT HEART CATHETERIZATION WITH CORONARY ANGIOGRAM N/A 04/12/2012   Procedure: LEFT HEART CATHETERIZATION WITH CORONARY ANGIOGRAM;  Surgeon: Kathleene Hazel, MD;  Location: [facility] CATH LAB;  Service: Cardiovascular;  Laterality: N/A;  . [name] DILATION N/A 06/06/2020   Procedure: [name] DILATION;  Surgeon: Corbin Ade, MD;  [facility];  Service: Endoscopy;  Laterality: N/A;  . POLYPECTOMY  06/06/2020   Procedure: POLYPECTOMY;  Surgeon: Corbin Ade, MD;  [facility];  Service: Endoscopy;;  gastric  . TOTAL ABDOMINAL HYSTERECTOMY       Current Outpatient Medications  Medication Sig Dispense Refill  . acetaminophen (TYLENOL) 650 MG CR tablet Take 1,300 mg by mouth 2 (two) times daily.    . Alum & Mag Hydroxide-Simeth (MYLANTA PO) Take by mouth as needed.    Marland Kitchen  apixaban (ELIQUIS) 5 MG TABS tablet Take 5 mg by mouth 2 (two) times daily.    . Calcium Carb-Cholecalciferol (CALCIUM 600+D3 PO) Take 1 tablet by mouth daily.     . calcium carbonate (TUMS - DOSED IN MG ELEMENTAL CALCIUM) 500 MG chewable tablet Chew 1 tablet by mouth as needed for indigestion or heartburn.     . diltiazem (CARDIZEM CD) 180 MG 24 hr capsule TAKE 1 CAPSULE BY MOUTH EVERY DAY 90 capsule 3  . flecainide  (TAMBOCOR) 50 MG tablet Take 1 tablet (50 mg total) by mouth 2 (two) times daily. 180 tablet 3  . fluticasone (FLONASE) 50 MCG/ACT nasal spray Place 2 sprays into both nostrils daily as needed for allergies or rhinitis.     Marland Kitchen gabapentin (NEURONTIN) 100 MG capsule Take 100 mg by mouth at bedtime.    . Multiple Vitamin (MULTIVITAMIN WITH MINERALS) TABS tablet Take 1 tablet by mouth daily. Centrum Silver for Women    . omeprazole (PRILOSEC) 20 MG capsule Take 20 mg by mouth 2 (two) times daily before a meal.    . ondansetron (ZOFRAN ODT) 8 MG disintegrating tablet 8mg  ODT q4 hours prn nausea 8 tablet 0  . Polyethyl Glycol-Propyl Glycol (SYSTANE OP) Place 1 drop into both eyes 2 (two) times daily as needed (dry eyes).     No current facility-administered medications for this visit.    Allergies:   Amiodarone, Dexilant [dexlansoprazole], Flomax [tamsulosin hcl], and Pantoprazole sodium   Social History:  The patient  reports that she has never smoked. She has never used smokeless tobacco. She reports previous alcohol use. She reports that she does not use drugs.   Family History:  The patient's family history includes Coronary artery disease in her father; Heart attack in her brother; Heart disease in her brother; Hypertension in her brother and mother; Other in her brother, sister, and sister; Stroke in her brother and brother; Transient ischemic attack in her brother.   ROS:  Please see the history of present illness.   Otherwise, review of systems is positive for none.   All other systems are reviewed and negative.   PHYSICAL EXAM: VS:  BP 130/72   Pulse 65   Ht 5\' 7"  (1.702 m)   Wt 191 lb (86.6 kg)   SpO2 97%   BMI 29.91 kg/m  , BMI Body mass index is 29.91 kg/m. GEN: Well nourished, well developed, in no acute distress  HEENT: normal  Neck: no JVD, carotid bruits, or masses Cardiac: RRR; no murmurs, rubs, or gallops,no edema  Respiratory:  clear to auscultation bilaterally, normal  work of breathing GI: soft, nontender, nondistended, + BS MS: no deformity or atrophy  Skin: warm and dry Neuro:  Strength and sensation are intact Psych: euthymic mood, full affect  EKG:  EKG is ordered today. Personal review of the ekg ordered shows sinus rhythm, rate 65, first-degree AV block, and a right bundle branch block   Recent Labs: 09/28/2020: ALT 25; BUN 22; Creatinine, Ser 1.16; Hemoglobin 13.1; Platelets 219; Potassium 3.9; Sodium 137    Lipid Panel  No results found for: CHOL, TRIG, HDL, CHOLHDL, VLDL, LDLCALC, LDLDIRECT   Wt Readings from Last 3 Encounters:  12/04/20 191 lb (86.6 kg)  10/16/20 192 lb 3.2 oz (87.2 kg)  09/28/20 192 lb 14.4 oz (87.5 kg)      Other studies Reviewed: Additional studies/ records that were reviewed today include: TTE 05/20/2017 Review of the above records today demonstrates:  - Left  ventricle: The cavity size was normal. Wall thickness was   normal. Systolic function was normal. The estimated ejection   fraction was in the range of 60% to 65%. Wall motion was normal;   there were no regional wall motion abnormalities. Left   ventricular diastolic function parameters were normal. - Aortic valve: Mildly to moderately calcified annulus. Trileaflet.   There was mild regurgitation. - Mitral valve: Calcified annulus. Normal thickness leaflets .   There was moderate regurgitation. - Tricuspid valve: There was mild regurgitation. - Pulmonary arteries: PA peak pressure: 34 mm Hg (S).   ASSESSMENT AND PLAN:  1.  Persistent atrial fibrillation: Currently on Eliquis and flecainide.  High risk medication monitoring.  CHA2DS2-VASc of 3.  Is status post ablation 08/20/2018 with repeat ablation 05/11/2019.  She is fortunately remained in sinus rhythm.  Unfortunately she is short of breath today.  She is short of breath mainly with exertion.  Due to that, we Kimothy Kishimoto check a BNP.  If this is normal, she may require a transthoracic echo or a 14-day  monitor.  2.  Hypertension: Currently well controlled  3.  Shortness of breath: Patient does not appear volume overloaded.  We Harlean Regula check a BNP today.  If this is negative, a 14-day monitor and echo may be reasonable.  Current medicines are reviewed at length with the patient today.   The patient does not have concerns regarding her medicines.  The following changes were made today: None  Labs/ tests ordered today include:  Orders Placed This Encounter  Procedures  . Pro b natriuretic peptide (BNP)  . EKG 12-Lead     Disposition:   FU with Neesa Knapik 6 months  Signed, Kilea Mccarey Jorja Loa, MD  12/04/2020 10:51 AM     [facility] [address] [place] [address] [phone] (office) [phone] (fax)

## 2020-12-05 ENCOUNTER — Telehealth: Payer: Self-pay | Admitting: Cardiology

## 2020-12-05 DIAGNOSIS — Z79899 Other long term (current) drug therapy: Secondary | ICD-10-CM

## 2020-12-05 DIAGNOSIS — R0602 Shortness of breath: Secondary | ICD-10-CM

## 2020-12-05 LAB — PRO B NATRIURETIC PEPTIDE: NT-Pro BNP: 620 pg/mL (ref 0–738)

## 2020-12-05 MED ORDER — FUROSEMIDE 20 MG PO TABS: 20.0000 mg | ORAL_TABLET | Freq: Every day | ORAL | 0 refills | Status: DC

## 2020-12-05 NOTE — Telephone Encounter (Addendum)
Returned pt call.  Made aware lab result and Dr. Gershon Crane recommendation. Aware to take Lasix 20 mg x 4 days. Pt will stop by the office 5/16 for follow up lab work. Patient verbalized understanding and agreeable to plan.   (reports last Cr on 4/8 was 1.39. previously was 1.42)

## 2020-12-05 NOTE — Telephone Encounter (Signed)
    Pt is requesting to speak with Sherri. She did not give any info, she wanted to speak with her

## 2020-12-17 ENCOUNTER — Other Ambulatory Visit: Payer: Medicare PPO | Admitting: *Deleted

## 2020-12-17 ENCOUNTER — Other Ambulatory Visit: Payer: Self-pay

## 2020-12-17 DIAGNOSIS — Z79899 Other long term (current) drug therapy: Secondary | ICD-10-CM | POA: Diagnosis not present

## 2020-12-17 DIAGNOSIS — R0602 Shortness of breath: Secondary | ICD-10-CM

## 2020-12-18 LAB — BASIC METABOLIC PANEL
BUN/Creatinine Ratio: 12 (ref 12–28)
BUN: 15 mg/dL (ref 8–27)
CO2: 22 mmol/L (ref 20–29)
Calcium: 8.9 mg/dL (ref 8.7–10.3)
Chloride: 104 mmol/L (ref 96–106)
Creatinine, Ser: 1.24 mg/dL — ABNORMAL HIGH (ref 0.57–1.00)
Glucose: 93 mg/dL (ref 65–99)
Potassium: 4.1 mmol/L (ref 3.5–5.2)
Sodium: 143 mmol/L (ref 134–144)
eGFR: 44 mL/min/{1.73_m2} — ABNORMAL LOW (ref >59)

## 2020-12-19 ENCOUNTER — Ambulatory Visit: Payer: Medicare PPO | Admitting: Nurse Practitioner

## 2021-01-01 ENCOUNTER — Ambulatory Visit (INDEPENDENT_AMBULATORY_CARE_PROVIDER_SITE_OTHER): Payer: Medicare PPO | Admitting: Gastroenterology

## 2021-01-01 ENCOUNTER — Encounter: Payer: Self-pay | Admitting: Gastroenterology

## 2021-01-01 VITALS — BP 128/62 | HR 49 | Temp 96.9°F | Ht 67.0 in | Wt 192.6 lb

## 2021-01-01 DIAGNOSIS — R197 Diarrhea, unspecified: Secondary | ICD-10-CM | POA: Diagnosis not present

## 2021-01-01 DIAGNOSIS — K219 Gastro-esophageal reflux disease without esophagitis: Secondary | ICD-10-CM

## 2021-01-01 DIAGNOSIS — R1010 Upper abdominal pain, unspecified: Secondary | ICD-10-CM

## 2021-01-01 NOTE — Patient Instructions (Addendum)
1. Please go to [facility] and have blood work done today.  Pick up stool container while you are there.  You will need to return specimen to [facility] when completed.  We will contact you with results when available.  Further recommendations to follow. 2. You can use Imodium half to 1 tablet up to twice daily as needed for loose stools.  Hold if constipation. 3. Continue omeprazole 20 mg twice daily before meals. 4. Consider adding a good probiotic to stabilize your bowel movements. You can consume one yogurt daily OR take over-the-counter supplements such as Librarian, academic or Philips colon health.  At [facility] we value your feedback. You may receive a survey about your visit today. Please share your experience as we strive to create trusting relationships with our patients to provide genuine, compassionate, quality care.   We appreciate your understanding and patience as we review any laboratory studies, imaging, and other diagnostic tests that are ordered as we care for you. Our office policy is 5 business days for review of these results, and any emergent or urgent results are addressed in a timely manner for your best interest. If you do not hear from our office in 1 week, please contact us.    We also encourage the use of MyChart, which contains your medical information for your review as well. If you are not enrolled in this feature, an access code is on this after visit summary for your convenience. Thank you for allowing [place] to be involved in your care.

## 2021-01-01 NOTE — Progress Notes (Signed)
Primary Care Physician: Richardean Chimera, MD  Primary Gastroenterologist:  Roetta Sessions, MD   Chief Complaint  Patient presents with  . Abdominal Pain    Mid upper abd. Worse in the mornings. Occ takes Tums. Also right and left upper abd pain  . Diarrhea    Occ  . Gastroesophageal Reflux    HPI: Hailey [name] is a 80 y.o. female here for follow-up.  Last seen in the office in March 2022.  History of epigastric pain, GERD, dysphagia, chest pain felt to be reflux related (multiple work-ups for cardiology etiology found to be negative).  Historically, symptoms have been difficult to manage.  Previously on omeprazole 40 mg twice daily but due to side effects she decreased herself to once daily.  EGD with normal esophagus, few pedunculated and sessile polyps in the gastric body, esophagus status post dilation, polypectomy.  Dexilant: Patient reports diarrhea and painful tongue (treated with for thrush).  Reported weakness to Pepcid.  Notably she had received COVID booster the week prior.  Tolerates omeprazole (minimally effective at low-dose, reported side effects with 40 mg twice daily), Carafate deemed ineffective and had side effects.  Patient states pantoprazole turned her "red".  Tried Nexium, reported ineffective on 20 mg.  When increased to 40 mg she reported facial swelling and burning near her nose, lips, cheeks.  After her last office visit, she was doing somewhat improved on omeprazole 20 mg twice daily but required frequent Tums.  She wanted to try a different PPI, was started on lansoprazole 30 mg daily.  Today: No reaction to lansoprazole but not effective so went back to omeprazole. After EGD with swtich to dexilant had diarrhea and abdominal pain and lost 10 pounds. Feels better back on omeprazole. Sometimes in the morning epigastric pain when wakes up at 2am to use the bathroom (to urinate). Might take a tums and then goes back to bed. Notices this pain for two months but  may not had been able to feel it before due to all the other "crap" she was feeling after being on Dexilant. Doesn't want to change medication dose or try new medication as long as she is feeling as well as she does on omeprazole 20 mg twice daily.  She also reports increased diarrhea over the past 2 to 3 weeks.  3-4 stools per day.  Passing loose stools but not a lot.  Afraid to pass gas because she might pass stool as well.  No melena or rectal bleeding.  No nocturnal stools. No recent antibiotics.  CT abdomen pelvis with contrast February 2022: Status postcholecystectomy.  Mild extrahepatic biliary ductal dilation with extrahepatic bile duct measuring 11 to 12 mm, possibly representing postcholecystectomy change.  Liver unremarkable.  Severe sigmoid diverticulosis.  Ventral hernia repair with mesh.  No recurrent abdominal wall hernia.  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  EGD November 2021 -Normal esophagus. Dilated. -A few gastric polyps. Resected and retrieved.  Hyperplastic gastric polyp.  Reactive gastropathy.  No H. pylori. -Normal duodenal bulb and second portion of the duodenum.  Current Outpatient Medications  Medication Sig Dispense Refill  . acetaminophen (TYLENOL) 650 MG CR tablet Take 1,300 mg by mouth 2 (two) times daily.    . Alum & Mag Hydroxide-Simeth (MYLANTA PO) Take by mouth as needed.    Marland Kitchen apixaban (ELIQUIS) 5 MG TABS tablet Take 5 mg by mouth 2 (two)  times daily.    . Calcium Carb-Cholecalciferol (CALCIUM 600+D3 PO) Take 1 tablet by mouth daily.     . calcium carbonate (TUMS - DOSED IN MG ELEMENTAL CALCIUM) 500 MG chewable tablet Chew 1 tablet by mouth as needed for indigestion or heartburn.     . diltiazem (CARDIZEM CD) 180 MG 24 hr capsule TAKE 1 CAPSULE BY MOUTH EVERY DAY 90 capsule 3  . flecainide (TAMBOCOR) 50 MG tablet Take 1 tablet (50 mg total) by mouth 2 (two)  times daily. 180 tablet 3  . fluticasone (FLONASE) 50 MCG/ACT nasal spray Place 2 sprays into both nostrils daily as needed for allergies or rhinitis.     Marland Kitchen gabapentin (NEURONTIN) 100 MG capsule Take 100 mg by mouth at bedtime.    . Multiple Vitamin (MULTIVITAMIN WITH MINERALS) TABS tablet Take 1 tablet by mouth daily. Centrum Silver for Women    . omeprazole (PRILOSEC) 20 MG capsule Take 20 mg by mouth 2 (two) times daily before a meal.    . ondansetron (ZOFRAN ODT) 8 MG disintegrating tablet 8mg  ODT q4 hours prn nausea 8 tablet 0  . Polyethyl Glycol-Propyl Glycol (SYSTANE OP) Place 1 drop into both eyes 2 (two) times daily as needed (dry eyes).     No current facility-administered medications for this visit.    Allergies as of 01/01/2021 - Review Complete 01/01/2021  Allergen Reaction Noted  . Amiodarone Itching 04/24/2018  . Dexilant [dexlansoprazole] Diarrhea 07/17/2020  . Flomax [tamsulosin hcl] Nausea And Vomiting 01/24/2015  . Pantoprazole sodium Other (See Comments) 07/17/2020    ROS:  General: Negative for anorexia, weight loss, fever, chills, fatigue, weakness. ENT: Negative for hoarseness, difficulty swallowing , nasal congestion. CV: Negative for chest pain, angina, palpitations, dyspnea on exertion, peripheral edema.  Respiratory: Negative for dyspnea at rest, dyspnea on exertion, cough, sputum, wheezing.  GI: See history of present illness. GU:  Negative for dysuria, hematuria, urinary incontinence, urinary frequency, nocturnal urination.  Endo: Negative for unusual weight change.    Physical Examination:   BP 128/62   Pulse (!) 49   Temp (!) 96.9 F (36.1 C) (Temporal)   Ht 5\' 7"  (1.702 m)   Wt 192 lb 9.6 oz (87.4 kg)   BMI 30.17 kg/m   General: Well-nourished, well-developed in no acute distress.  Eyes: No icterus. Mouth: masked Lungs: Clear to auscultation bilaterally.  Heart: Regular rate and rhythm, no murmurs rubs or gallops.  Abdomen: Bowel sounds are  normal, nontender, nondistended, no hepatosplenomegaly or masses, no abdominal bruits or hernia , no rebound or guarding.  Rectus diastases.  No evidence of abdominal hernia. Extremities: No lower extremity edema. No clubbing or deformities. Neuro: Alert and oriented x 4   Skin: Warm and dry, no jaundice.   Psych: Alert and cooperative, normal mood and affect.  Labs:  Lab Results  Component Value Date   CREATININE 1.24 (H) 12/17/2020   BUN 15 12/17/2020   NA 143 12/17/2020   K 4.1 12/17/2020   CL 104 12/17/2020   CO2 22 12/17/2020   Lab Results  Component Value Date   ALT 25 09/28/2020   AST 30 09/28/2020   ALKPHOS 70 09/28/2020   BILITOT 0.4 09/28/2020   Lab Results  Component Value Date   WBC 6.6 09/28/2020   HGB 13.1 09/28/2020   HCT 40.4 09/28/2020   MCV 103.3 (H) 09/28/2020   PLT 219 09/28/2020   Lab Results  Component Value Date   INR 1.2 09/28/2020   INR  1.1 06/27/2020   INR 1.3 (H) 04/29/2019   Lab Results  Component Value Date   LIPASE 20 09/28/2020      No results found for: FOLATE   Imaging Studies: No results found.   Assessment: 80 year old female with GERD, previous dysphagia, chest pain related to GERD with multiple intolerances failure to PPIs.  Has found omeprazole 20 mg twice daily has been the best regimen for her.  At this time her reflux type symptoms are fairly stable.  She is not interested in changing her therapy.  Is some mild epigastric pain typically notes during the night when she wakes up to urinate.  Typically resolves with Tums.  Concerned about 2 to 3-week history of persisting loose stools 3-4 times daily.  Differential diagnosis includes infectious etiology, side effect of medication.  Based on symptoms, less likely microscopic colitis.   Plan: 1. Blood work, stool studies. 2. Use Imodium half to 1 tablet up to twice daily as needed for loose stools.  Hold for constipation. 3. Continue omeprazole 20 mg twice daily before  meals. 4. Consider adding added probiotics to stabilize bowel movements.  I will consume 1 yogurt daily or try over-the-counter supplements such as Align or Philips colon health.

## 2021-01-02 DIAGNOSIS — K219 Gastro-esophageal reflux disease without esophagitis: Secondary | ICD-10-CM | POA: Diagnosis not present

## 2021-01-02 DIAGNOSIS — R1010 Upper abdominal pain, unspecified: Secondary | ICD-10-CM | POA: Diagnosis not present

## 2021-01-02 DIAGNOSIS — R197 Diarrhea, unspecified: Secondary | ICD-10-CM | POA: Diagnosis not present

## 2021-01-02 LAB — COMPREHENSIVE METABOLIC PANEL
ALT: 13 IU/L (ref 0–32)
AST: 21 IU/L (ref 0–40)
Albumin/Globulin Ratio: 2 (ref 1.2–2.2)
Albumin: 4.6 g/dL (ref 3.7–4.7)
Alkaline Phosphatase: 101 IU/L (ref 44–121)
BUN/Creatinine Ratio: 13 (ref 12–28)
BUN: 17 mg/dL (ref 8–27)
Bilirubin Total: 0.3 mg/dL (ref 0.0–1.2)
CO2: 23 mmol/L (ref 20–29)
Calcium: 9.6 mg/dL (ref 8.7–10.3)
Chloride: 101 mmol/L (ref 96–106)
Creatinine, Ser: 1.26 mg/dL — ABNORMAL HIGH (ref 0.57–1.00)
Globulin, Total: 2.3 g/dL (ref 1.5–4.5)
Glucose: 93 mg/dL (ref 65–99)
Potassium: 5.1 mmol/L (ref 3.5–5.2)
Sodium: 142 mmol/L (ref 134–144)
Total Protein: 6.9 g/dL (ref 6.0–8.5)
eGFR: 43 mL/min/{1.73_m2} — ABNORMAL LOW (ref >59)

## 2021-01-02 LAB — CBC WITH DIFFERENTIAL/PLATELET
Basophils Absolute: 0 10*3/uL (ref 0.0–0.2)
Basos: 1 %
EOS (ABSOLUTE): 0.1 10*3/uL (ref 0.0–0.4)
Eos: 1 %
Hematocrit: 41.2 % (ref 34.0–46.6)
Hemoglobin: 13.9 g/dL (ref 11.1–15.9)
Immature Grans (Abs): 0 10*3/uL (ref 0.0–0.1)
Immature Granulocytes: 0 %
Lymphocytes Absolute: 2.5 10*3/uL (ref 0.7–3.1)
Lymphs: 34 %
MCH: 33.2 pg — ABNORMAL HIGH (ref 26.6–33.0)
MCHC: 33.7 g/dL (ref 31.5–35.7)
MCV: 98 fL — ABNORMAL HIGH (ref 79–97)
Monocytes Absolute: 0.5 10*3/uL (ref 0.1–0.9)
Monocytes: 6 %
Neutrophils Absolute: 4.3 10*3/uL (ref 1.4–7.0)
Neutrophils: 58 %
Platelets: 265 10*3/uL (ref 150–450)
RBC: 4.19 x10E6/uL (ref 3.77–5.28)
RDW: 13.1 % (ref 11.7–15.4)
WBC: 7.4 10*3/uL (ref 3.4–10.8)

## 2021-01-02 LAB — LIPASE: Lipase: 24 U/L (ref 14–85)

## 2021-01-03 LAB — CLOSTRIDIUM DIFFICILE BY PCR: Toxigenic C. Difficile by PCR: NEGATIVE

## 2021-01-05 LAB — GI PROFILE, STOOL, PCR

## 2021-01-07 NOTE — Progress Notes (Signed)
Cc'ed to pcp °

## 2021-01-15 ENCOUNTER — Encounter: Payer: Self-pay | Admitting: Internal Medicine

## 2021-02-12 ENCOUNTER — Ambulatory Visit (HOSPITAL_COMMUNITY)
Admission: RE | Admit: 2021-02-12 | Discharge: 2021-02-12 | Disposition: A | Discharge status: Home / Self Care | Payer: Medicare PPO | Source: Ambulatory Visit | Attending: Adult Health | Admitting: Adult Health

## 2021-02-12 ENCOUNTER — Other Ambulatory Visit: Payer: Self-pay

## 2021-02-12 DIAGNOSIS — R922 Inconclusive mammogram: Secondary | ICD-10-CM | POA: Diagnosis not present

## 2021-02-12 DIAGNOSIS — N6489 Other specified disorders of breast: Secondary | ICD-10-CM | POA: Diagnosis not present

## 2021-02-13 DIAGNOSIS — M25562 Pain in left knee: Secondary | ICD-10-CM | POA: Diagnosis not present

## 2021-02-13 DIAGNOSIS — Z6828 Body mass index (BMI) 28.0-28.9, adult: Secondary | ICD-10-CM | POA: Diagnosis not present

## 2021-02-13 DIAGNOSIS — M545 Low back pain, unspecified: Secondary | ICD-10-CM | POA: Diagnosis not present

## 2021-02-22 DIAGNOSIS — Z85828 Personal history of other malignant neoplasm of skin: Secondary | ICD-10-CM | POA: Diagnosis not present

## 2021-02-22 DIAGNOSIS — L821 Other seborrheic keratosis: Secondary | ICD-10-CM | POA: Diagnosis not present

## 2021-02-22 DIAGNOSIS — C44519 Basal cell carcinoma of skin of other part of trunk: Secondary | ICD-10-CM | POA: Diagnosis not present

## 2021-02-22 DIAGNOSIS — L570 Actinic keratosis: Secondary | ICD-10-CM | POA: Diagnosis not present

## 2021-02-22 DIAGNOSIS — L814 Other melanin hyperpigmentation: Secondary | ICD-10-CM | POA: Diagnosis not present

## 2021-02-22 DIAGNOSIS — D1801 Hemangioma of skin and subcutaneous tissue: Secondary | ICD-10-CM | POA: Diagnosis not present

## 2021-02-25 ENCOUNTER — Other Ambulatory Visit: Payer: Self-pay | Admitting: Cardiology

## 2021-03-14 DIAGNOSIS — H353131 Nonexudative age-related macular degeneration, bilateral, early dry stage: Secondary | ICD-10-CM | POA: Diagnosis not present

## 2021-03-14 DIAGNOSIS — H2512 Age-related nuclear cataract, left eye: Secondary | ICD-10-CM | POA: Diagnosis not present

## 2021-03-22 DIAGNOSIS — H2512 Age-related nuclear cataract, left eye: Secondary | ICD-10-CM | POA: Diagnosis not present

## 2021-03-26 DIAGNOSIS — N1832 Chronic kidney disease, stage 3b: Secondary | ICD-10-CM | POA: Diagnosis not present

## 2021-03-26 DIAGNOSIS — E7849 Other hyperlipidemia: Secondary | ICD-10-CM | POA: Diagnosis not present

## 2021-03-26 DIAGNOSIS — Z1329 Encounter for screening for other suspected endocrine disorder: Secondary | ICD-10-CM | POA: Diagnosis not present

## 2021-03-26 DIAGNOSIS — I10 Essential (primary) hypertension: Secondary | ICD-10-CM | POA: Diagnosis not present

## 2021-03-26 DIAGNOSIS — E782 Mixed hyperlipidemia: Secondary | ICD-10-CM | POA: Diagnosis not present

## 2021-03-26 DIAGNOSIS — K219 Gastro-esophageal reflux disease without esophagitis: Secondary | ICD-10-CM | POA: Diagnosis not present

## 2021-03-28 DIAGNOSIS — I340 Nonrheumatic mitral (valve) insufficiency: Secondary | ICD-10-CM | POA: Diagnosis not present

## 2021-03-28 DIAGNOSIS — I10 Essential (primary) hypertension: Secondary | ICD-10-CM | POA: Diagnosis not present

## 2021-03-28 DIAGNOSIS — H02402 Unspecified ptosis of left eyelid: Secondary | ICD-10-CM | POA: Diagnosis not present

## 2021-03-28 DIAGNOSIS — E7849 Other hyperlipidemia: Secondary | ICD-10-CM | POA: Diagnosis not present

## 2021-03-28 DIAGNOSIS — G2581 Restless legs syndrome: Secondary | ICD-10-CM | POA: Diagnosis not present

## 2021-03-28 DIAGNOSIS — N1832 Chronic kidney disease, stage 3b: Secondary | ICD-10-CM | POA: Diagnosis not present

## 2021-03-28 DIAGNOSIS — E042 Nontoxic multinodular goiter: Secondary | ICD-10-CM | POA: Diagnosis not present

## 2021-03-28 DIAGNOSIS — J309 Allergic rhinitis, unspecified: Secondary | ICD-10-CM | POA: Diagnosis not present

## 2021-04-01 DIAGNOSIS — H2511 Age-related nuclear cataract, right eye: Secondary | ICD-10-CM | POA: Diagnosis not present

## 2021-04-03 DIAGNOSIS — H2511 Age-related nuclear cataract, right eye: Secondary | ICD-10-CM | POA: Diagnosis not present

## 2021-05-02 DIAGNOSIS — E042 Nontoxic multinodular goiter: Secondary | ICD-10-CM | POA: Diagnosis not present

## 2021-05-02 DIAGNOSIS — E041 Nontoxic single thyroid nodule: Secondary | ICD-10-CM | POA: Diagnosis not present

## 2021-05-03 ENCOUNTER — Other Ambulatory Visit: Payer: Self-pay

## 2021-05-03 ENCOUNTER — Encounter: Payer: Self-pay | Admitting: Gastroenterology

## 2021-05-03 ENCOUNTER — Ambulatory Visit: Payer: Medicare PPO | Admitting: Gastroenterology

## 2021-05-03 VITALS — BP 140/72 | HR 61 | Temp 97.0°F | Ht 67.0 in | Wt 191.4 lb

## 2021-05-03 DIAGNOSIS — K219 Gastro-esophageal reflux disease without esophagitis: Secondary | ICD-10-CM

## 2021-05-03 NOTE — Patient Instructions (Signed)
Continue omeprazole 20mg  twice daily before a meal.  We will plan to see you back in six months, but call sooner if you need !

## 2021-05-03 NOTE — Progress Notes (Signed)
Primary Care Physician: Richardean Chimera, MD  Primary Gastroenterologist:  Roetta Sessions, MD   Chief Complaint  Patient presents with   Gastroesophageal Reflux    Doing fine since back on omeprazole   Diarrhea    Occas now, nothing like prior    HPI: Hailey [name] is a 80 y.o. female here for follow-up.  Last seen in May.  History of epigastric pain, GERD, dysphagia, chest pain felt to be reflux related (multiple work-ups for cardiac etiology found to be negative).    Previously did well on omeprazole 40 mg twice daily as far as reflux management but she decreased due to side effects on her own.  She reports Dexilant caused diarrhea and thrush.  Reported weakness to Pepcid although she received COVID booster the week prior to taking Pepcid.  Carafate seemed ineffective and had side effects.  Pantoprazole turned her "red".  Nexium 20 mg daily ineffective but 40 mg daily she reported facial swelling and burning near her nose, lips, cheeks.  Lansoprazole was not helpful. Has been back on omeprazole 20mg  bid and doing better.   Today: doing well. Heartburn is better back on omeprazole 20mg  BID. Feels so much better. Has to be careful with swallowing chicken, but has learned to chew thoroughly and take her time. Denies abdominal pain. Her BMs are so much better, only occasional loose stool. No melena, brbpr.   Prior work up: Back in June she completed stool test for diarrhea.  C. difficile PCR and GI pathogen panel were negative.   CT abdomen pelvis with contrast February 2022: Status postcholecystectomy.  Mild extrahepatic biliary ductal dilation with extrahepatic bile duct measuring 11 to 12 mm, possibly representing postcholecystectomy change.  Liver unremarkable.  Severe sigmoid diverticulosis.  Ventral hernia repair with mesh.  No recurrent abdominal wall hernia.  Mild wispy mesenteric inflammation within the mesenteric root, possibly infectious or inflammatory as can be seen with  mesenteric panniculitis.  EGD November 2021 -Normal esophagus. Dilated. -A few gastric polyps. Resected and retrieved.  Hyperplastic gastric polyp.  Reactive gastropathy.  No H. pylori. -Normal duodenal bulb and second portion of the duodenum.  Current Outpatient Medications  Medication Sig Dispense Refill   acetaminophen (TYLENOL) 650 MG CR tablet Take 1,300 mg by mouth 2 (two) times daily.     Alum & Mag Hydroxide-Simeth (MYLANTA PO) Take by mouth as needed.     apixaban (ELIQUIS) 5 MG TABS tablet Take 5 mg by mouth 2 (two) times daily.     Calcium Carb-Cholecalciferol (CALCIUM 600+D3 PO) Take 1 tablet by mouth daily.      calcium carbonate (TUMS - DOSED IN MG ELEMENTAL CALCIUM) 500 MG chewable tablet Chew 1 tablet by mouth as needed for indigestion or heartburn.      diltiazem (CARDIZEM CD) 180 MG 24 hr capsule TAKE 1 CAPSULE BY MOUTH EVERY DAY 90 capsule 3   flecainide (TAMBOCOR) 50 MG tablet TAKE 1 TABLET BY MOUTH TWICE A DAY 180 tablet 3   fluticasone (FLONASE) 50 MCG/ACT nasal spray Place 2 sprays into both nostrils daily as needed for allergies or rhinitis.      gabapentin (NEURONTIN) 100 MG capsule Take 100 mg by mouth at bedtime.     Multiple Vitamin (MULTIVITAMIN WITH MINERALS) TABS tablet Take 1 tablet by mouth daily. Centrum Silver for Women     omeprazole (PRILOSEC) 20 MG capsule Take 20 mg by mouth 2 (two) times daily before a meal.  ondansetron (ZOFRAN ODT) 8 MG disintegrating tablet 8mg  ODT q4 hours prn nausea 8 tablet 0   Polyethyl Glycol-Propyl Glycol (SYSTANE OP) Place 1 drop into both eyes 2 (two) times daily as needed (dry eyes).     No current facility-administered medications for this visit.    Allergies as of 05/03/2021 - Review Complete 05/03/2021  Allergen Reaction Noted   Amiodarone Itching 04/24/2018   Dexilant [dexlansoprazole] Diarrhea and Other (See Comments) 07/17/2020   Flomax [tamsulosin hcl] Nausea And Vomiting 01/24/2015   Nexium [esomeprazole]  Other (See Comments) 01/02/2021   Pantoprazole sodium Other (See Comments) 07/17/2020    ROS:  General: Negative for anorexia, weight loss, fever, chills, fatigue, weakness. ENT: Negative for hoarseness, difficulty swallowing , nasal congestion. CV: Negative for chest pain, angina, palpitations, dyspnea on exertion, peripheral edema.  Respiratory: Negative for dyspnea at rest, dyspnea on exertion, cough, sputum, wheezing.  GI: See history of present illness. GU:  Negative for dysuria, hematuria, urinary incontinence, urinary frequency, nocturnal urination.  Endo: Negative for unusual weight change.    Physical Examination:   BP 140/72   Pulse 61   Temp (!) 97 F (36.1 C)   Ht 5\' 7"  (1.702 m)   Wt 191 lb 6.4 oz (86.8 kg)   BMI 29.98 kg/m   General: Well-nourished, well-developed in no acute distress.  Eyes: No icterus. Mouth:masked.  Abdomen: Bowel sounds are normal, nontender, nondistended, no hepatosplenomegaly or masses, no abdominal bruits, no rebound or guarding.   Extremities: No lower extremity edema. No clubbing or deformities. Neuro: Alert and oriented x 4   Skin: Warm and dry, no jaundice.   Psych: Alert and cooperative, normal mood and affect.  Labs:  Lab Results  Component Value Date   CREATININE 1.26 (H) 01/01/2021   BUN 17 01/01/2021   NA 142 01/01/2021   K 5.1 01/01/2021   CL 101 01/01/2021   CO2 23 01/01/2021   Lab Results  Component Value Date   WBC 7.4 01/01/2021   HGB 13.9 01/01/2021   HCT 41.2 01/01/2021   MCV 98 (H) 01/01/2021   PLT 265 01/01/2021   Lab Results  Component Value Date   ALT 13 01/01/2021   AST 21 01/01/2021   ALKPHOS 101 01/01/2021   BILITOT 0.3 01/01/2021     Imaging Studies: No results found.   Assessment:  GERD: Doing well at this time on omeprazole 20 mg twice daily.  Has been intolerant to multiple PPIs as outlined above.  Continue antireflux measures.  Diarrhea: Resolved.    Plan: Continue omeprazole  20mg  BID. Reinforced antireflux measures.  Office visit in 6 months.

## 2021-05-14 DIAGNOSIS — H52223 Regular astigmatism, bilateral: Secondary | ICD-10-CM | POA: Diagnosis not present

## 2021-05-14 DIAGNOSIS — H524 Presbyopia: Secondary | ICD-10-CM | POA: Diagnosis not present

## 2021-05-14 DIAGNOSIS — Z23 Encounter for immunization: Secondary | ICD-10-CM | POA: Diagnosis not present

## 2021-05-14 DIAGNOSIS — Z961 Presence of intraocular lens: Secondary | ICD-10-CM | POA: Diagnosis not present

## 2021-06-04 DIAGNOSIS — H02055 Trichiasis without entropian left lower eyelid: Secondary | ICD-10-CM | POA: Diagnosis not present

## 2021-06-04 DIAGNOSIS — H02051 Trichiasis without entropian right upper eyelid: Secondary | ICD-10-CM | POA: Diagnosis not present

## 2021-06-04 DIAGNOSIS — H02054 Trichiasis without entropian left upper eyelid: Secondary | ICD-10-CM | POA: Diagnosis not present

## 2021-06-26 ENCOUNTER — Ambulatory Visit: Payer: Medicare PPO | Admitting: Cardiology

## 2021-06-26 ENCOUNTER — Encounter: Payer: Self-pay | Admitting: Cardiology

## 2021-06-26 ENCOUNTER — Other Ambulatory Visit: Payer: Self-pay

## 2021-06-26 VITALS — BP 138/78 | HR 65 | Ht 67.0 in | Wt 190.2 lb

## 2021-06-26 DIAGNOSIS — R0602 Shortness of breath: Secondary | ICD-10-CM

## 2021-06-26 DIAGNOSIS — I4819 Other persistent atrial fibrillation: Secondary | ICD-10-CM

## 2021-06-26 NOTE — Progress Notes (Signed)
Electrophysiology Office Note   Date:  06/26/2021   ID:  Hailey [name], DOB 06-13-1941, MRN [id number]  PCP:  Hailey Chimera, MD  Cardiologist: Purvis Sheffield Primary Electrophysiologist:  Hailey Yoshino Jorja Loa, MD    No chief complaint on file.    History of Present Illness: Hailey [name] is a 80 y.o. female who is being seen today for the evaluation of atrial fibrillation at the request of Hailey Chimera, MD. Presenting today for electrophysiology evaluation.    She has a history significant for atrial fibrillation.  She is status post ablation x2 most recently 05/11/2019.  She had more episodes of atrial fibrillation and is now on flecainide.  She does have syncope due to bradycardia and thus her beta-blocker was stopped.  Today, denies symptoms of palpitations, chest pain, shortness of breath, orthopnea, PND, lower extremity edema, claudication, dizziness, presyncope, syncope, bleeding, or neurologic sequela. The patient is tolerating medications without difficulties.  Since being seen, she continues to have episodes of shortness of breath.  Her shortness of breath mainly occurs when she is exerting herself.  She does not have shortness of breath at rest.  She continues to do all her daily activities, but has to rest sometimes.   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   Migraine headache    Palpitations    Restless leg syndrome    Vaginal atrophy    Past Surgical History:  Procedure Laterality Date   APPENDECTOMY     ATRIAL FIBRILLATION ABLATION N/A 08/20/2018   Procedure: ATRIAL FIBRILLATION ABLATION;  Surgeon: Hailey Lemming, MD;  Location: [facility];  Service: Cardiovascular;  Laterality: N/A;   ATRIAL FIBRILLATION ABLATION N/A 05/11/2019   Procedure: ATRIAL FIBRILLATION ABLATION;  Surgeon: Hailey Lemming, MD;  Location: [facility];  Service: Cardiovascular;  Laterality: N/A;   BIOPSY  06/06/2020   Procedure: BIOPSY;  Surgeon: Hailey Ade, MD;  Location: [facility];  Service: Endoscopy;;  gastric   CARDIAC CATHETERIZATION  2005    cone   CARDIOVERSION N/A 05/02/2019   Procedure: CARDIOVERSION;  Surgeon: Hailey Red, MD;  Location:  [facility] ENDOSCOPY;  Service: Cardiovascular;  Laterality: N/A;   COLONOSCOPY     ESOPHAGOGASTRODUODENOSCOPY N/A 06/06/2020   Procedure: ESOPHAGOGASTRODUODENOSCOPY (EGD);  Surgeon: Hailey Ade, MD;  Location: [facility];  Service: Endoscopy;  Laterality: N/A;  7:30am   HERNIA REPAIR     LEFT HEART CATHETERIZATION WITH CORONARY ANGIOGRAM N/A 04/12/2012   Procedure: LEFT HEART CATHETERIZATION WITH CORONARY ANGIOGRAM;  Surgeon: Hailey Hazel, MD;  Location: [facility] CATH LAB;  Service: Cardiovascular;  Laterality: N/A;   MALONEY DILATION N/A 06/06/2020   Procedure: Hailey [name] DILATION;  Surgeon: Hailey Ade, MD;  Location: [facility];  Service: Endoscopy;  Laterality: N/A;   POLYPECTOMY  06/06/2020   Procedure: POLYPECTOMY;  Surgeon: Hailey Ade, MD;  Location: [facility];  Service: Endoscopy;;  gastric   TOTAL ABDOMINAL HYSTERECTOMY       Current Outpatient Medications  Medication Sig Dispense Refill   acetaminophen (TYLENOL) 650 MG CR tablet Take 1,300 mg by mouth 2 (two) times daily.     Alum & Mag Hydroxide-Simeth (MYLANTA PO) Take by mouth as needed.     apixaban (ELIQUIS) 5 MG  TABS tablet Take 5 mg by mouth 2 (two) times daily.     Calcium Carb-Cholecalciferol (CALCIUM 600+D3 PO) Take 1 tablet by mouth daily.      calcium carbonate (TUMS - DOSED IN MG ELEMENTAL CALCIUM) 500 MG chewable tablet Chew 1 tablet by mouth as needed for indigestion or heartburn.      diltiazem (CARDIZEM CD) 180 MG 24 hr capsule TAKE 1 CAPSULE BY MOUTH EVERY DAY 90 capsule 3   flecainide (TAMBOCOR) 50 MG tablet TAKE 1 TABLET BY MOUTH TWICE  A DAY 180 tablet 3   fluticasone (FLONASE) 50 MCG/ACT nasal spray Place 2 sprays into both nostrils daily as needed for allergies or rhinitis.      gabapentin (NEURONTIN) 100 MG capsule Take 100 mg by mouth at bedtime.     Multiple Vitamin (MULTIVITAMIN WITH MINERALS) TABS tablet Take 1 tablet by mouth daily. Centrum Silver for Women     omeprazole (PRILOSEC) 20 MG capsule Take 20 mg by mouth 2 (two) times daily before a meal.     ondansetron (ZOFRAN ODT) 8 MG disintegrating tablet 8mg  ODT q4 hours prn nausea 8 tablet 0   Polyethyl Glycol-Propyl Glycol (SYSTANE OP) Place 1 drop into both eyes 2 (two) times daily as needed (dry eyes).     No current facility-administered medications for this visit.    Allergies:   Amiodarone, Dexilant [dexlansoprazole], Flomax [tamsulosin hcl], Nexium [esomeprazole], and Pantoprazole sodium   Social History:  The patient  reports that she has never smoked. She has never used smokeless tobacco. She reports that she does not currently use alcohol. She reports that she does not use drugs.   Family History:  The patient's family history includes Coronary artery disease in her father; Heart attack in her brother; Heart disease in her brother; Hypertension in her brother and mother; Other in her brother, sister, and sister; Stroke in her brother and brother; Transient ischemic attack in her brother.   ROS:  Please see the history of present illness.   Otherwise, review of systems is positive for none.   All other systems are reviewed and negative.   PHYSICAL EXAM: VS:  BP 138/78   Pulse 65   Ht 5\' 7"  (1.702 m)   Wt 190 lb 3.2 oz (86.3 kg)   SpO2 96%   BMI 29.79 kg/m  , BMI Body mass index is 29.79 kg/m. GEN: Well nourished, well developed, in no acute distress  HEENT: normal  Neck: no JVD, carotid bruits, or masses Cardiac: RRR; no murmurs, rubs, or gallops,no edema  Respiratory:  clear to auscultation bilaterally, normal work of breathing GI: soft,  nontender, nondistended, + BS MS: no deformity or atrophy  Skin: warm and dry Neuro:  Strength and sensation are intact Psych: euthymic mood, full affect  EKG:  EKG is ordered today. Personal review of the ekg ordered shows sinus rhythm, rate 65   Recent Labs: 12/04/2020: NT-Pro BNP 620 01/01/2021: ALT 13; BUN 17; Creatinine, Ser 1.26; Hemoglobin 13.9; Platelets 265; Potassium 5.1; Sodium 142    Lipid Panel  No results found for: CHOL, TRIG, HDL, CHOLHDL, VLDL, LDLCALC, LDLDIRECT   Wt Readings from Last 3 Encounters:  06/26/21 190 lb 3.2 oz (86.3 kg)  05/03/21 191 lb 6.4 oz (86.8 kg)  01/01/21 192 lb 9.6 oz (87.4 kg)      Other studies Reviewed: Additional studies/ records that were reviewed today include: TTE 05/20/2017 Review of the above records today demonstrates:  - Left ventricle: The cavity  size was normal. Wall thickness was   normal. Systolic function was normal. The estimated ejection   fraction was in the range of 60% to 65%. Wall motion was normal;   there were no regional wall motion abnormalities. Left   ventricular diastolic function parameters were normal. - Aortic valve: Mildly to moderately calcified annulus. Trileaflet.   There was mild regurgitation. - Mitral valve: Calcified annulus. Normal thickness leaflets .   There was moderate regurgitation. - Tricuspid valve: There was mild regurgitation. - Pulmonary arteries: PA peak pressure: 34 mm Hg (S).   ASSESSMENT AND PLAN:  1.  Persistent atrial fibrillation: Currently on flecainide 50 mg twice daily, diltiazem 120 mg daily, Eliquis 5 mg twice daily.  High risk medication monitoring via ECG for flecainide. Status post ablation 08/20/2018 with repeat ablation 05/11/2019.  She remains in sinus rhythm.  No changes.  2.  Hypertension: Mildly elevated today but usually well controlled  3.  Shortness of breath.  Patient has continued to have shortness of breath.  She was given Lasix without much improvement.  We  Oluwatobi Ruppe plan for transthoracic echo to ensure that her ejection fraction remains normal.  Current medicines are reviewed at length with the patient today.   The patient does not have concerns regarding her medicines.  The following changes were made today: None  Labs/ tests ordered today include:  Orders Placed This Encounter  Procedures   EKG 12-Lead   ECHOCARDIOGRAM COMPLETE      Disposition:   FU with Shamecka Hocutt 6 months  Signed, Ryonna Cimini Meredith Leeds, MD  06/26/2021 10:53 AM     [address] [place] [address] [phone] (office) [phone] (fax)

## 2021-06-26 NOTE — Patient Instructions (Signed)
Medication Instructions:  Your physician recommends that you continue on your current medications as directed. Please refer to the Current Medication list given to you today.  *If you need a refill on your cardiac medications before your next appointment, please call your pharmacy*   Lab Work: None ordered   Testing/Procedures: Your physician has requested that you have an echocardiogram. Echocardiography is a painless test that uses sound waves to create images of your heart. It provides your doctor with information about the size and shape of your heart and how well your heart's chambers and valves are working. This procedure takes approximately one hour. There are no restrictions for this procedure.    Follow-Up: At [facility], you and your health needs are our priority.  As part of our continuing mission to provide you with exceptional heart care, we have created designated Provider Care Teams.  These Care Teams include your primary Cardiologist (physician) and Advanced Practice Providers (APPs -  Physician Assistants and Nurse Practitioners) who all work together to provide you with the care you need, when you need it.  Your next appointment:   6 month(s)  The format for your next appointment:   In Person  Provider:   You will see one of the following Advanced Practice Providers on your designated Care Team:   Francis Dowse, [place] Casimiro Needle "Mardelle Matte" Lanna Poche, [place]    Thank you for choosing [facility]!!   Dory Horn, RN [phone]

## 2021-07-04 DIAGNOSIS — T1512XA Foreign body in conjunctival sac, left eye, initial encounter: Secondary | ICD-10-CM | POA: Diagnosis not present

## 2021-07-19 ENCOUNTER — Ambulatory Visit (HOSPITAL_COMMUNITY): Payer: Medicare PPO | Attending: Cardiology

## 2021-07-19 ENCOUNTER — Other Ambulatory Visit: Payer: Self-pay

## 2021-07-19 DIAGNOSIS — I4819 Other persistent atrial fibrillation: Secondary | ICD-10-CM | POA: Diagnosis not present

## 2021-07-19 DIAGNOSIS — R0602 Shortness of breath: Secondary | ICD-10-CM | POA: Diagnosis not present

## 2021-07-19 LAB — ECHOCARDIOGRAM COMPLETE
Area-P 1/2: 6.07 cm2
P 1/2 time: 524 msec
S' Lateral: 2.3 cm

## 2021-07-22 DIAGNOSIS — M25561 Pain in right knee: Secondary | ICD-10-CM | POA: Diagnosis not present

## 2021-07-22 DIAGNOSIS — Z6829 Body mass index (BMI) 29.0-29.9, adult: Secondary | ICD-10-CM | POA: Diagnosis not present

## 2021-07-23 DIAGNOSIS — H02132 Senile ectropion of right lower eyelid: Secondary | ICD-10-CM | POA: Diagnosis not present

## 2021-07-23 DIAGNOSIS — H04203 Unspecified epiphora, bilateral lacrimal glands: Secondary | ICD-10-CM | POA: Diagnosis not present

## 2021-07-23 DIAGNOSIS — H02106 Unspecified ectropion of left eye, unspecified eyelid: Secondary | ICD-10-CM | POA: Diagnosis not present

## 2021-07-23 DIAGNOSIS — H02135 Senile ectropion of left lower eyelid: Secondary | ICD-10-CM | POA: Diagnosis not present

## 2021-07-29 DIAGNOSIS — E042 Nontoxic multinodular goiter: Secondary | ICD-10-CM | POA: Diagnosis not present

## 2021-07-29 DIAGNOSIS — N183 Chronic kidney disease, stage 3 unspecified: Secondary | ICD-10-CM | POA: Diagnosis not present

## 2021-07-29 DIAGNOSIS — E782 Mixed hyperlipidemia: Secondary | ICD-10-CM | POA: Diagnosis not present

## 2021-07-29 DIAGNOSIS — I10 Essential (primary) hypertension: Secondary | ICD-10-CM | POA: Diagnosis not present

## 2021-07-29 DIAGNOSIS — E7849 Other hyperlipidemia: Secondary | ICD-10-CM | POA: Diagnosis not present

## 2021-07-29 DIAGNOSIS — E041 Nontoxic single thyroid nodule: Secondary | ICD-10-CM | POA: Diagnosis not present

## 2021-07-30 DIAGNOSIS — E7849 Other hyperlipidemia: Secondary | ICD-10-CM | POA: Diagnosis not present

## 2021-07-30 DIAGNOSIS — Z0001 Encounter for general adult medical examination with abnormal findings: Secondary | ICD-10-CM | POA: Diagnosis not present

## 2021-07-30 DIAGNOSIS — J309 Allergic rhinitis, unspecified: Secondary | ICD-10-CM | POA: Diagnosis not present

## 2021-07-30 DIAGNOSIS — G2581 Restless legs syndrome: Secondary | ICD-10-CM | POA: Diagnosis not present

## 2021-07-30 DIAGNOSIS — I340 Nonrheumatic mitral (valve) insufficiency: Secondary | ICD-10-CM | POA: Diagnosis not present

## 2021-07-30 DIAGNOSIS — I10 Essential (primary) hypertension: Secondary | ICD-10-CM | POA: Diagnosis not present

## 2021-07-30 DIAGNOSIS — N1832 Chronic kidney disease, stage 3b: Secondary | ICD-10-CM | POA: Diagnosis not present

## 2021-07-30 DIAGNOSIS — Z6828 Body mass index (BMI) 28.0-28.9, adult: Secondary | ICD-10-CM | POA: Diagnosis not present

## 2021-08-04 ENCOUNTER — Other Ambulatory Visit: Payer: Self-pay | Admitting: Cardiology

## 2021-08-15 ENCOUNTER — Other Ambulatory Visit: Payer: Self-pay | Admitting: *Deleted

## 2021-08-15 MED ORDER — DILTIAZEM HCL ER COATED BEADS 180 MG PO CP24: ORAL_CAPSULE | ORAL | 1 refills | Status: DC

## 2021-08-27 DIAGNOSIS — D2272 Melanocytic nevi of left lower limb, including hip: Secondary | ICD-10-CM | POA: Diagnosis not present

## 2021-08-27 DIAGNOSIS — D225 Melanocytic nevi of trunk: Secondary | ICD-10-CM | POA: Diagnosis not present

## 2021-08-27 DIAGNOSIS — D2262 Melanocytic nevi of left upper limb, including shoulder: Secondary | ICD-10-CM | POA: Diagnosis not present

## 2021-08-27 DIAGNOSIS — L814 Other melanin hyperpigmentation: Secondary | ICD-10-CM | POA: Diagnosis not present

## 2021-08-27 DIAGNOSIS — D1801 Hemangioma of skin and subcutaneous tissue: Secondary | ICD-10-CM | POA: Diagnosis not present

## 2021-08-27 DIAGNOSIS — Z85828 Personal history of other malignant neoplasm of skin: Secondary | ICD-10-CM | POA: Diagnosis not present

## 2021-08-27 DIAGNOSIS — L820 Inflamed seborrheic keratosis: Secondary | ICD-10-CM | POA: Diagnosis not present

## 2021-08-27 DIAGNOSIS — L821 Other seborrheic keratosis: Secondary | ICD-10-CM | POA: Diagnosis not present

## 2021-08-27 DIAGNOSIS — L570 Actinic keratosis: Secondary | ICD-10-CM | POA: Diagnosis not present

## 2021-08-30 DIAGNOSIS — H0100B Unspecified blepharitis left eye, upper and lower eyelids: Secondary | ICD-10-CM | POA: Diagnosis not present

## 2021-08-30 DIAGNOSIS — H0100A Unspecified blepharitis right eye, upper and lower eyelids: Secondary | ICD-10-CM | POA: Diagnosis not present

## 2021-09-06 DIAGNOSIS — R519 Headache, unspecified: Secondary | ICD-10-CM | POA: Diagnosis not present

## 2021-09-06 DIAGNOSIS — Z6828 Body mass index (BMI) 28.0-28.9, adult: Secondary | ICD-10-CM | POA: Diagnosis not present

## 2021-09-17 DIAGNOSIS — H01009 Unspecified blepharitis unspecified eye, unspecified eyelid: Secondary | ICD-10-CM | POA: Diagnosis not present

## 2021-09-18 DIAGNOSIS — G8929 Other chronic pain: Secondary | ICD-10-CM | POA: Diagnosis not present

## 2021-09-18 DIAGNOSIS — R519 Headache, unspecified: Secondary | ICD-10-CM | POA: Diagnosis not present

## 2021-09-18 DIAGNOSIS — Z6828 Body mass index (BMI) 28.0-28.9, adult: Secondary | ICD-10-CM | POA: Diagnosis not present

## 2021-09-24 DIAGNOSIS — Z85828 Personal history of other malignant neoplasm of skin: Secondary | ICD-10-CM | POA: Diagnosis not present

## 2021-09-24 DIAGNOSIS — L988 Other specified disorders of the skin and subcutaneous tissue: Secondary | ICD-10-CM | POA: Diagnosis not present

## 2021-09-24 DIAGNOSIS — D485 Neoplasm of uncertain behavior of skin: Secondary | ICD-10-CM | POA: Diagnosis not present

## 2021-10-01 DIAGNOSIS — H02052 Trichiasis without entropian right lower eyelid: Secondary | ICD-10-CM | POA: Diagnosis not present

## 2021-10-01 DIAGNOSIS — H02055 Trichiasis without entropian left lower eyelid: Secondary | ICD-10-CM | POA: Diagnosis not present

## 2021-10-01 DIAGNOSIS — H02054 Trichiasis without entropian left upper eyelid: Secondary | ICD-10-CM | POA: Diagnosis not present

## 2021-10-29 ENCOUNTER — Other Ambulatory Visit: Payer: Self-pay

## 2021-10-29 ENCOUNTER — Ambulatory Visit: Payer: Medicare PPO | Admitting: Gastroenterology

## 2021-10-29 ENCOUNTER — Encounter: Payer: Self-pay | Admitting: Gastroenterology

## 2021-10-29 VITALS — BP 138/70 | HR 62 | Temp 97.4°F | Ht 67.0 in | Wt 190.8 lb

## 2021-10-29 DIAGNOSIS — R1319 Other dysphagia: Secondary | ICD-10-CM | POA: Diagnosis not present

## 2021-10-29 DIAGNOSIS — K219 Gastro-esophageal reflux disease without esophagitis: Secondary | ICD-10-CM

## 2021-10-29 NOTE — Progress Notes (Signed)
? ?GI Office Note   ? ?Referring Provider: Caryl Bis, MD ?Primary Care Physician:  Caryl Bis, MD ?Primary GI: Dr. Gala Romney ? ?Date:  10/29/2021  ?Hailey [name], DOB 1940/08/19, MRN [id number] ? ? ?Chief Complaint  ? ?Chief Complaint  ?Patient presents with  ? Follow-up  ? ? ? ?History of Present Illness  ?Hailey [name] is a 81 y.o. female presenting today with a history of GERD, dysphagia, A-fib on Eliquis, greens, HTN, HLD, M?ni?re's disease, and restless leg presenting today for follow-up of GERD and dysphagia. ? ?Last EGD November 2021 with benign gastric polyps and normal esophagus status post dilation.  ? ?In the past patient was taking omeprazole 40 mg twice daily for reflux and had decreased the dose on her own due to side effects.  It was recommended for her to take Dexilant after her last EGD however she reported side effects of diarrhea and thrush and had experienced multiple side effects from other PPIs including pantoprazole and Nexium.  No reported side effects from lansoprazole however it is felt that it was not helpful.  With Pepcid and Carafate as well.  She was then switched back to omeprazole 20 mg twice daily and was doing well at her last office visit in September 2022.  ? ?Today:  ?Patient reports that she has been feeling well overall.  Denies any nausea/vomiting, burning, coughing, abdominal pain, melena, hematochezia.  She reports symptoms have been controlled on omeprazole 20 mg twice daily and is not interested in changing it as she has had many issues with prior PPIs including diarrhea and weight loss. She does report some intermittent solid food dysphagia but is not as bad as prior and does not believe she needs to undergo stretching at this time.  She reports that mostly happens when she has a cheeseburger and/or fries from [employer].  Denies any food regurgitation.  She feels like she may not be chewing her food up as well and she is not alternating bites with liquids.   Denies any pill or liquid dysphagia. ? ?Had [place] in January - had coughing with that and had lingered but this pretty much gone now. Had gotten it from her sister.  ? ? ? ?Past Medical History:  ?Diagnosis Date  ? Atrial fibrillation ([place])   ? Breast nodule 11/16/2013  ? H/O hematuria   ? HLD (hyperlipidemia)   ? Hypertension   ? Meniere's disease   ? ringing in the ears  ? Migraine headache   ? Palpitations   ? Restless leg syndrome   ? Vaginal atrophy   ? ? ?Past Surgical History:  ?Procedure Laterality Date  ? APPENDECTOMY    ? ATRIAL FIBRILLATION ABLATION N/A 08/20/2018  ? Procedure: ATRIAL FIBRILLATION ABLATION;  Surgeon: Constance Haw, MD;  Location: [place] CV LAB;  Service: Cardiovascular;  Laterality: N/A;  ? ATRIAL FIBRILLATION ABLATION N/A 05/11/2019  ? Procedure: ATRIAL FIBRILLATION ABLATION;  Surgeon: Constance Haw, MD;  Location: [place] CV LAB;  Service: Cardiovascular;  Laterality: N/A;  ? BIOPSY  06/06/2020  ? Procedure: BIOPSY;  Surgeon: Daneil Dolin, MD;  [facility];  Service: Endoscopy;;  gastric  ? CARDIAC CATHETERIZATION  08/05/2003  ? cone  ? CARDIOVERSION N/A 05/02/2019  ? Procedure: CARDIOVERSION;  Surgeon: Buford Dresser, MD;  Location: [facility] ENDOSCOPY;  Service: Cardiovascular;  Laterality: N/A;  ? COLONOSCOPY    ? ESOPHAGOGASTRODUODENOSCOPY N/A 06/06/2020  ? benign gastric polyps and normal esophagus  status post dilation, normal duodenum  ? HERNIA REPAIR    ? LEFT HEART CATHETERIZATION WITH CORONARY ANGIOGRAM N/A 04/12/2012  ? Procedure: LEFT HEART CATHETERIZATION WITH CORONARY ANGIOGRAM;  Surgeon: Burnell Blanks, MD;  Location: [facility] CATH LAB;  Service: Cardiovascular;  Laterality: N/A;  ? MALONEY DILATION N/A 06/06/2020  ? Procedure: MALONEY DILATION;  Surgeon: Daneil Dolin, MD;  [facility];  Service: Endoscopy;  Laterality: N/A;  ? POLYPECTOMY  06/06/2020  ? Procedure: POLYPECTOMY;  Surgeon: Daneil Dolin, MD;  [facility];  Service: Endoscopy;;  gastric  ? TOTAL ABDOMINAL HYSTERECTOMY    ? ? ?Current Outpatient Medications  ?Medication Sig Dispense Refill  ? acetaminophen (TYLENOL) 650 MG CR tablet Take 1,300 mg by mouth 2 (two) times daily.    ? apixaban (ELIQUIS) 5 MG TABS tablet Take 5 mg by mouth 2 (two) times daily.    ? Calcium Carb-Cholecalciferol (CALCIUM 600+D3 PO) Take 1 tablet by mouth daily.     ? calcium carbonate (TUMS - DOSED IN MG ELEMENTAL CALCIUM) 500 MG chewable tablet Chew 1 tablet by mouth as needed for indigestion or heartburn.     ? diltiazem (CARDIZEM CD) 180 MG 24 hr capsule TAKE 1 CAPSULE BY MOUTH EVERY DAY 90 capsule 1  ? flecainide (TAMBOCOR) 50 MG tablet TAKE 1 TABLET BY MOUTH TWICE A DAY 180 tablet 3  ? gabapentin (NEURONTIN) 100 MG capsule Take 100 mg by mouth at bedtime.    ? Multiple Vitamin (MULTIVITAMIN WITH MINERALS) TABS tablet Take 1 tablet by mouth daily. Centrum Silver for Women    ? omeprazole (PRILOSEC) 20 MG capsule Take 20 mg by mouth 2 (two) times daily before a meal.    ? ondansetron (ZOFRAN ODT) 8 MG disintegrating tablet 8mg  ODT q4 hours prn nausea 8 tablet 0  ? ?No current facility-administered medications for this visit.  ? ? ?Allergies as of 10/29/2021 - Review Complete 10/29/2021  ?Allergen Reaction Noted  ? Amiodarone Itching 04/24/2018  ? Dexilant [dexlansoprazole] Diarrhea and Other (See Comments) 07/17/2020  ? Flomax [tamsulosin hcl] Nausea And Vomiting 01/24/2015  ? Nexium [esomeprazole] Other (See Comments) 01/02/2021  ? Pantoprazole sodium Other (See Comments) 07/17/2020  ? ? ?Family History  ?Problem Relation Age of Onset  ? Coronary artery disease Father   ?     mid 65s  ? Hypertension Mother   ? Stroke Brother   ? Transient ischemic attack Brother   ? Other Brother   ?     on oxygen  ? Hypertension Brother   ? Stroke Brother   ? Heart disease Brother   ? Heart attack Brother   ? Other Sister   ?     a fib, restless leg  ? Other Sister   ?     pressure in eyes   ? Colon cancer Neg Hx   ? Gastric cancer Neg Hx   ? Esophageal cancer Neg Hx   ? ? ?Social History  ? ?Socioeconomic History  ? Marital status: Single  ?  Spouse name: Not on file  ? Number of children: 0  ? Years of education: College  ? Highest education level: Not on file  ?Occupational History  ? Occupation: retired  ?Tobacco Use  ? Smoking status: Never  ? Smokeless tobacco: Never  ?Vaping Use  ? Vaping Use: Never used  ?Substance and Sexual Activity  ? Alcohol use: Not Currently  ?  Comment: rarely; glass of wine with  dinner  ? Drug use: No  ? Sexual activity: Never  ?  Birth control/protection: Surgical  ?  Comment: hyst  ?Other Topics Concern  ? Not on file  ?Social History Narrative  ? Single. Retired.   ? Patient lives at home alone.  ? Caffeine Use: none  ? ?Social Determinants of Health  ? ?Financial Resource Strain: Not on file  ?Food Insecurity: Not on file  ?Transportation Needs: Not on file  ?Physical Activity: Not on file  ?Stress: Not on file  ?Social Connections: Not on file  ? ? ? ?Review of Systems  ? ?Gen: Denies fever, chills, anorexia. Denies fatigue, weakness, weight loss.  ?CV: Denies chest pain, palpitations, syncope, peripheral edema, and claudication. ?Resp: Denies dyspnea at rest, cough, wheezing, coughing up blood, and pleurisy. ?GI: see HPI ?Derm: Denies rash, itching, dry skin ?Psych: Denies depression, anxiety, memory loss, confusion. No homicidal or suicidal ideation.  ?Heme: Denies bruising, bleeding, and enlarged lymph nodes. ? ? ?Physical Exam  ? ?BP 138/70   Pulse 62   Temp (!) 97.4 ?F (36.3 ?C) (Temporal)   Ht 5\' 7"  (1.702 m)   Wt 190 lb 12.8 oz (86.5 kg)   BMI 29.88 kg/m?  ? ?General:   Alert and oriented. No distress noted. Pleasant and cooperative.  ?Head:  Normocephalic and atraumatic. ?Eyes:  Conjuctiva clear without scleral icterus. ?Mouth:  Oral mucosa pink and moist. Good dentition. No lesions. ?Lungs:  Clear to auscultation bilaterally. No wheezes, rales, or  rhonchi. No distress.  ?Heart:  S1, S2 present without murmurs appreciated.  ?Abdomen:  +BS, soft, non-tender and non-distended. No rebound or guarding. No HSM or masses noted. ?Rectal:  ?Msk:  Symmetrical

## 2021-10-29 NOTE — Patient Instructions (Signed)
It was a pleasure meeting you today.  ? ?Continue omeprazole 20 mg twice daily.  Recommend taking smaller bites of food and alternating with sips of liquids.  Can discuss repeat EGD with dilation in the future if dysphagia symptoms worsen. ? ?Follow-up in 6 months or sooner as needed. ? ?It was a pleasure to meet you today. I want to create trusting relationships with patients. If you receive a survey regarding your visit,  I greatly appreciate you taking time to fill this out on paper or through your MyChart. I value your feedback. ? ?Brooke Bonito, MSN, FNP-BC, AGACNP-BC ?[facility] ?  ?

## 2021-10-30 DIAGNOSIS — R519 Headache, unspecified: Secondary | ICD-10-CM | POA: Diagnosis not present

## 2021-10-30 DIAGNOSIS — Z6828 Body mass index (BMI) 28.0-28.9, adult: Secondary | ICD-10-CM | POA: Diagnosis not present

## 2021-11-04 DIAGNOSIS — Z1329 Encounter for screening for other suspected endocrine disorder: Secondary | ICD-10-CM | POA: Diagnosis not present

## 2021-11-04 DIAGNOSIS — Z0001 Encounter for general adult medical examination with abnormal findings: Secondary | ICD-10-CM | POA: Diagnosis not present

## 2021-11-04 DIAGNOSIS — R5382 Chronic fatigue, unspecified: Secondary | ICD-10-CM | POA: Diagnosis not present

## 2021-11-04 DIAGNOSIS — E782 Mixed hyperlipidemia: Secondary | ICD-10-CM | POA: Diagnosis not present

## 2021-11-04 DIAGNOSIS — E7849 Other hyperlipidemia: Secondary | ICD-10-CM | POA: Diagnosis not present

## 2021-11-06 DIAGNOSIS — Z23 Encounter for immunization: Secondary | ICD-10-CM | POA: Diagnosis not present

## 2021-11-06 DIAGNOSIS — I340 Nonrheumatic mitral (valve) insufficiency: Secondary | ICD-10-CM | POA: Diagnosis not present

## 2021-11-06 DIAGNOSIS — E042 Nontoxic multinodular goiter: Secondary | ICD-10-CM | POA: Diagnosis not present

## 2021-11-06 DIAGNOSIS — I10 Essential (primary) hypertension: Secondary | ICD-10-CM | POA: Diagnosis not present

## 2021-11-06 DIAGNOSIS — I482 Chronic atrial fibrillation, unspecified: Secondary | ICD-10-CM | POA: Diagnosis not present

## 2021-11-06 DIAGNOSIS — E7849 Other hyperlipidemia: Secondary | ICD-10-CM | POA: Diagnosis not present

## 2021-11-06 DIAGNOSIS — N1832 Chronic kidney disease, stage 3b: Secondary | ICD-10-CM | POA: Diagnosis not present

## 2021-11-06 DIAGNOSIS — G2581 Restless legs syndrome: Secondary | ICD-10-CM | POA: Diagnosis not present

## 2021-11-12 DIAGNOSIS — H02135 Senile ectropion of left lower eyelid: Secondary | ICD-10-CM | POA: Diagnosis not present

## 2021-11-12 DIAGNOSIS — H02132 Senile ectropion of right lower eyelid: Secondary | ICD-10-CM | POA: Diagnosis not present

## 2021-11-12 DIAGNOSIS — H01009 Unspecified blepharitis unspecified eye, unspecified eyelid: Secondary | ICD-10-CM | POA: Diagnosis not present

## 2021-11-12 DIAGNOSIS — H02889 Meibomian gland dysfunction of unspecified eye, unspecified eyelid: Secondary | ICD-10-CM | POA: Diagnosis not present

## 2021-12-12 ENCOUNTER — Other Ambulatory Visit (HOSPITAL_COMMUNITY): Payer: Self-pay | Admitting: Adult Health

## 2021-12-12 DIAGNOSIS — Z1231 Encounter for screening mammogram for malignant neoplasm of breast: Secondary | ICD-10-CM

## 2021-12-16 DIAGNOSIS — D692 Other nonthrombocytopenic purpura: Secondary | ICD-10-CM | POA: Diagnosis not present

## 2021-12-16 DIAGNOSIS — D2262 Melanocytic nevi of left upper limb, including shoulder: Secondary | ICD-10-CM | POA: Diagnosis not present

## 2021-12-16 DIAGNOSIS — L821 Other seborrheic keratosis: Secondary | ICD-10-CM | POA: Diagnosis not present

## 2021-12-16 DIAGNOSIS — D485 Neoplasm of uncertain behavior of skin: Secondary | ICD-10-CM | POA: Diagnosis not present

## 2021-12-16 DIAGNOSIS — D1801 Hemangioma of skin and subcutaneous tissue: Secondary | ICD-10-CM | POA: Diagnosis not present

## 2021-12-16 DIAGNOSIS — Z85828 Personal history of other malignant neoplasm of skin: Secondary | ICD-10-CM | POA: Diagnosis not present

## 2021-12-16 DIAGNOSIS — L570 Actinic keratosis: Secondary | ICD-10-CM | POA: Diagnosis not present

## 2021-12-16 DIAGNOSIS — D225 Melanocytic nevi of trunk: Secondary | ICD-10-CM | POA: Diagnosis not present

## 2021-12-16 DIAGNOSIS — L814 Other melanin hyperpigmentation: Secondary | ICD-10-CM | POA: Diagnosis not present

## 2021-12-26 ENCOUNTER — Ambulatory Visit: Payer: Medicare PPO | Admitting: Cardiology

## 2021-12-26 ENCOUNTER — Encounter: Payer: Self-pay | Admitting: Cardiology

## 2021-12-26 VITALS — BP 132/70 | HR 68 | Ht 67.0 in | Wt 194.0 lb

## 2021-12-26 DIAGNOSIS — D6869 Other thrombophilia: Secondary | ICD-10-CM

## 2021-12-26 DIAGNOSIS — I480 Paroxysmal atrial fibrillation: Secondary | ICD-10-CM

## 2021-12-26 NOTE — Progress Notes (Signed)
Electrophysiology Office Note   Date:  12/26/2021   ID:  Zamyriah, Polley March 17, 1941, MRN [id number]  PCP:  Caryl Bis, MD  Cardiologist: Bronson Ing Primary Electrophysiologist:  Mete Purdum Meredith Leeds, MD    No chief complaint on file.     History of Present Illness: Hailey [name] is a 81 y.o. female who is being seen today for the evaluation of atrial fibrillation at the request of Caryl Bis, MD. Presenting today for electrophysiology evaluation.    She has a history significant for atrial fibrillation.  Status post ablation x2, most recently 05/11/2019.  She had more episodes of atrial fibrillation and was started on flecainide.  She does have syncope due to bradycardia and thus her beta-blocker was stopped.  Today, denies symptoms of palpitations, chest pain, shortness of breath, orthopnea, PND, lower extremity edema, claudication, dizziness, presyncope, syncope, bleeding, or neurologic sequela. The patient is tolerating medications without difficulties.  She has no further episodes of atrial fibrillation.  She is overall quite happy with her control.  She is tolerating the flecainide without issue.   Past Medical History:  Diagnosis Date   Atrial fibrillation ([place])    Breast nodule 11/16/2013   H/O hematuria    HLD (hyperlipidemia)    Hypertension    Meniere's disease    ringing in the ears   Migraine headache    Palpitations    Restless leg syndrome    Vaginal atrophy    Past Surgical History:  Procedure Laterality Date   APPENDECTOMY     ATRIAL FIBRILLATION ABLATION N/A 08/20/2018   Procedure: ATRIAL FIBRILLATION ABLATION;  Surgeon: Constance Haw, MD;  Location: [place] CV LAB;  Service: Cardiovascular;  Laterality: N/A;   ATRIAL FIBRILLATION ABLATION N/A 05/11/2019   Procedure: ATRIAL FIBRILLATION ABLATION;  Surgeon: Constance Haw, MD;  Location: [place] CV LAB;  Service: Cardiovascular;  Laterality: N/A;   BIOPSY  06/06/2020    Procedure: BIOPSY;  Surgeon: Daneil Dolin, MD;  [facility];  Service: Endoscopy;;  gastric   CARDIAC CATHETERIZATION  08/05/2003   cone   CARDIOVERSION N/A 05/02/2019   Procedure: CARDIOVERSION;  Surgeon: Buford Dresser, MD;  Location: [facility] ENDOSCOPY;  Service: Cardiovascular;  Laterality: N/A;   COLONOSCOPY     ESOPHAGOGASTRODUODENOSCOPY N/A 06/06/2020   benign gastric polyps and normal esophagus status post dilation, normal duodenum   HERNIA REPAIR     LEFT HEART CATHETERIZATION WITH CORONARY ANGIOGRAM N/A 04/12/2012   Procedure: LEFT HEART CATHETERIZATION WITH CORONARY ANGIOGRAM;  Surgeon: Burnell Blanks, MD;  Location: [facility] CATH LAB;  Service: Cardiovascular;  Laterality: N/A;   MALONEY DILATION N/A 06/06/2020   Procedure: Venia Minks DILATION;  Surgeon: Daneil Dolin, MD;  [facility];  Service: Endoscopy;  Laterality: N/A;   POLYPECTOMY  06/06/2020   Procedure: POLYPECTOMY;  Surgeon: Daneil Dolin, MD;  [facility];  Service: Endoscopy;;  gastric   TOTAL ABDOMINAL HYSTERECTOMY       Current Outpatient Medications  Medication Sig Dispense Refill   acetaminophen (TYLENOL) 650 MG CR tablet Take 1,300 mg by mouth 2 (two) times daily.     apixaban (ELIQUIS) 5 MG TABS tablet Take 5 mg by mouth 2 (two) times daily.     Calcium Carb-Cholecalciferol (CALCIUM 600+D3 PO) Take 1 tablet by mouth daily.      calcium carbonate (TUMS - DOSED IN MG ELEMENTAL CALCIUM) 500 MG chewable tablet Chew 1 tablet by mouth as needed  for indigestion or heartburn.      diclofenac Sodium (VOLTAREN) 1 % GEL Apply topically as directed.     diltiazem (CARDIZEM CD) 180 MG 24 hr capsule TAKE 1 CAPSULE BY MOUTH EVERY DAY 90 capsule 1   flecainide (TAMBOCOR) 50 MG tablet TAKE 1 TABLET BY MOUTH TWICE A DAY 180 tablet 3   gabapentin (NEURONTIN) 100 MG capsule Take 100 mg by mouth at bedtime.     Multiple Vitamin (MULTIVITAMIN WITH MINERALS) TABS tablet Take 1 tablet by  mouth daily. Centrum Silver for Women     multivitamin-lutein (OCUVITE-LUTEIN) CAPS capsule Take 1 capsule by mouth daily.     omeprazole (PRILOSEC) 20 MG capsule Take 20 mg by mouth 2 (two) times daily before a meal.     ondansetron (ZOFRAN ODT) 8 MG disintegrating tablet 8mg  ODT q4 hours prn nausea 8 tablet 0   No current facility-administered medications for this visit.    Allergies:   Amiodarone, Dexilant [dexlansoprazole], Flomax [tamsulosin hcl], Nexium [esomeprazole], and Pantoprazole sodium   Social History:  The patient  reports that she has never smoked. She has never used smokeless tobacco. She reports that she does not currently use alcohol. She reports that she does not use drugs.   Family History:  The patient's family history includes Coronary artery disease in her father; Heart attack in her brother; Heart disease in her brother; Hypertension in her brother and mother; Other in her brother, sister, and sister; Stroke in her brother and brother; Transient ischemic attack in her brother.   ROS:  Please see the history of present illness.   Otherwise, review of systems is positive for none.   All other systems are reviewed and negative.   PHYSICAL EXAM: VS:  BP 132/70   Pulse 68   Ht 5\' 7"  (1.702 m)   Wt 194 lb (88 kg)   SpO2 98%   BMI 30.38 kg/m  , BMI Body mass index is 30.38 kg/m. GEN: Well nourished, well developed, in no acute distress  HEENT: normal  Neck: no JVD, carotid bruits, or masses Cardiac: RRR; no murmurs, rubs, or gallops,no edema  Respiratory:  clear to auscultation bilaterally, normal work of breathing GI: soft, nontender, nondistended, + BS MS: no deformity or atrophy  Skin: warm and dry Neuro:  Strength and sensation are intact Psych: euthymic mood, full affect  EKG:  EKG is ordered today. Personal review of the ekg ordered shows sinus rhythm, rate 68, right bundle branch block   Recent Labs: 01/01/2021: ALT 13; BUN 17; Creatinine, Ser 1.26;  Hemoglobin 13.9; Platelets 265; Potassium 5.1; Sodium 142    Lipid Panel  No results found for: CHOL, TRIG, HDL, CHOLHDL, VLDL, LDLCALC, LDLDIRECT   Wt Readings from Last 3 Encounters:  12/26/21 194 lb (88 kg)  10/29/21 190 lb 12.8 oz (86.5 kg)  06/26/21 190 lb 3.2 oz (86.3 kg)      Other studies Reviewed: Additional studies/ records that were reviewed today include: TTE 05/20/2017 Review of the above records today demonstrates:  - Left ventricle: The cavity size was normal. Wall thickness was   normal. Systolic function was normal. The estimated ejection   fraction was in the range of 60% to 65%. Wall motion was normal;   there were no regional wall motion abnormalities. Left   ventricular diastolic function parameters were normal. - Aortic valve: Mildly to moderately calcified annulus. Trileaflet.   There was mild regurgitation. - Mitral valve: Calcified annulus. Normal thickness leaflets .  There was moderate regurgitation. - Tricuspid valve: There was mild regurgitation. - Pulmonary arteries: PA peak pressure: 34 mm Hg (S).   ASSESSMENT AND PLAN:  1.  Persistent atrial fibrillation: Currently on flecainide 50 mg twice daily, diltiazem 180 mg daily, Eliquis 5 mg twice daily.  Status post ablation 06/21/2019.  Repeat ablation 05/11/2019.  Remains in sinus rhythm.  CHA2DS2-VASc of at least 3.  No changes.  2.  Hypertension: well controlled   Current medicines are reviewed at length with the patient today.   The patient does not have concerns regarding her medicines.  The following changes were made today: none  Labs/ tests ordered today include:  Orders Placed This Encounter  Procedures   EKG 12-Lead      Disposition:   FU with A-fib clinic in 6 months  Signed, Brownie Gockel Meredith Leeds, MD  12/26/2021 11:15 AM     [facility] [address] [place] [address] [phone] (office) [phone] (fax)

## 2022-01-07 DIAGNOSIS — M1711 Unilateral primary osteoarthritis, right knee: Secondary | ICD-10-CM | POA: Diagnosis not present

## 2022-01-07 DIAGNOSIS — M2142 Flat foot [pes planus] (acquired), left foot: Secondary | ICD-10-CM | POA: Diagnosis not present

## 2022-01-07 DIAGNOSIS — M47817 Spondylosis without myelopathy or radiculopathy, lumbosacral region: Secondary | ICD-10-CM | POA: Diagnosis not present

## 2022-01-07 DIAGNOSIS — M2141 Flat foot [pes planus] (acquired), right foot: Secondary | ICD-10-CM | POA: Diagnosis not present

## 2022-01-07 DIAGNOSIS — M1611 Unilateral primary osteoarthritis, right hip: Secondary | ICD-10-CM | POA: Diagnosis not present

## 2022-01-07 DIAGNOSIS — R29898 Other symptoms and signs involving the musculoskeletal system: Secondary | ICD-10-CM | POA: Diagnosis not present

## 2022-01-07 DIAGNOSIS — M216X1 Other acquired deformities of right foot: Secondary | ICD-10-CM | POA: Diagnosis not present

## 2022-01-07 DIAGNOSIS — M21061 Valgus deformity, not elsewhere classified, right knee: Secondary | ICD-10-CM | POA: Diagnosis not present

## 2022-01-08 ENCOUNTER — Telehealth: Payer: Self-pay | Admitting: *Deleted

## 2022-01-08 DIAGNOSIS — I482 Chronic atrial fibrillation, unspecified: Secondary | ICD-10-CM | POA: Diagnosis not present

## 2022-01-08 DIAGNOSIS — Z6829 Body mass index (BMI) 29.0-29.9, adult: Secondary | ICD-10-CM | POA: Diagnosis not present

## 2022-01-08 DIAGNOSIS — M1611 Unilateral primary osteoarthritis, right hip: Secondary | ICD-10-CM | POA: Diagnosis not present

## 2022-01-08 NOTE — Telephone Encounter (Signed)
   Pre-operative Risk Assessment    Patient Name: Hailey [name]  DOB: 1940/09/02 MRN: [id number]      Request for Surgical Clearance    Procedure:   RIGHT INTRA ARTICULAR STEROID HIP INJECTION  Date of Surgery:  Clearance TBD                                 Surgeon:  NOT LISTED Surgeon's Group or Practice Name:  [place]; ORTHOPEDICS Phone number:  [phone] Fax number:  [phone]   Type of Clearance Requested:   - Medical  - Pharmacy:  Hold Apixaban (Eliquis)     Type of Anesthesia:  Not Indicated   Additional requests/questions:    Jiles Prows   01/08/2022, 4:49 PM

## 2022-01-11 NOTE — Telephone Encounter (Signed)
Patient with diagnosis of atrial fibrilliation on Eliquis for anticoagulation.    Procedure: right intra articular steroid hip injection Date of procedure: TBD   CHA2DS2-VASc Score = 5   This indicates a 7.2% annual risk of stroke. The patient's score is based upon: CHF History: 0 HTN History: 1 Diabetes History: 0 Stroke History: 0 Vascular Disease History: 1 Age Score: 2 Gender Score: 1  CrCl 39 Platelet count 245  Per office protocol, patient can hold Eliquis for 2 days prior to procedure.   Patient will not need bridging with Lovenox (enoxaparin) around procedure.

## 2022-01-13 NOTE — Telephone Encounter (Signed)
   Primary Cardiologist: Loman Brooklyn, MD  Chart reviewed as part of pre-operative protocol coverage. Given past medical history and time since last visit, based on ACC/AHA guidelines, Hailey [name] would be at acceptable risk for the planned procedure without further cardiovascular testing.   Guidelines for holding Eliquis are as follows:  CHA2DS2-VASc Score = 5   This indicates a 7.2% annual risk of stroke. The patient's score is based upon: CHF History: 0 HTN History: 1 Diabetes History: 0 Stroke History: 0 Vascular Disease History: 1 Age Score: 2 Gender Score: 1   CrCl 39 Platelet count 245   Per office protocol, patient can hold Eliquis for 2 days prior to procedure.   Patient will not need bridging with Lovenox (enoxaparin) around procedure.  I will route this recommendation to the requesting party via Epic fax function and remove from pre-op pool.  Please call with questions.  Levi Aland, NP-C    01/13/2022, 10:08 AM [facility] [address] Office [phone] Fax [phone]

## 2022-01-14 DIAGNOSIS — M1711 Unilateral primary osteoarthritis, right knee: Secondary | ICD-10-CM | POA: Diagnosis not present

## 2022-01-14 DIAGNOSIS — M47817 Spondylosis without myelopathy or radiculopathy, lumbosacral region: Secondary | ICD-10-CM | POA: Diagnosis not present

## 2022-01-14 DIAGNOSIS — M21061 Valgus deformity, not elsewhere classified, right knee: Secondary | ICD-10-CM | POA: Diagnosis not present

## 2022-01-14 DIAGNOSIS — M1611 Unilateral primary osteoarthritis, right hip: Secondary | ICD-10-CM | POA: Diagnosis not present

## 2022-01-14 DIAGNOSIS — R29898 Other symptoms and signs involving the musculoskeletal system: Secondary | ICD-10-CM | POA: Diagnosis not present

## 2022-01-22 DIAGNOSIS — M47817 Spondylosis without myelopathy or radiculopathy, lumbosacral region: Secondary | ICD-10-CM | POA: Diagnosis not present

## 2022-01-22 DIAGNOSIS — M1711 Unilateral primary osteoarthritis, right knee: Secondary | ICD-10-CM | POA: Diagnosis not present

## 2022-01-22 DIAGNOSIS — R29898 Other symptoms and signs involving the musculoskeletal system: Secondary | ICD-10-CM | POA: Diagnosis not present

## 2022-01-22 DIAGNOSIS — M21061 Valgus deformity, not elsewhere classified, right knee: Secondary | ICD-10-CM | POA: Diagnosis not present

## 2022-01-22 DIAGNOSIS — M1611 Unilateral primary osteoarthritis, right hip: Secondary | ICD-10-CM | POA: Diagnosis not present

## 2022-01-24 DIAGNOSIS — H5203 Hypermetropia, bilateral: Secondary | ICD-10-CM | POA: Diagnosis not present

## 2022-01-24 DIAGNOSIS — Z9849 Cataract extraction status, unspecified eye: Secondary | ICD-10-CM | POA: Diagnosis not present

## 2022-01-24 DIAGNOSIS — Z961 Presence of intraocular lens: Secondary | ICD-10-CM | POA: Diagnosis not present

## 2022-01-24 DIAGNOSIS — H524 Presbyopia: Secondary | ICD-10-CM | POA: Diagnosis not present

## 2022-01-24 DIAGNOSIS — H353131 Nonexudative age-related macular degeneration, bilateral, early dry stage: Secondary | ICD-10-CM | POA: Diagnosis not present

## 2022-01-24 DIAGNOSIS — H52223 Regular astigmatism, bilateral: Secondary | ICD-10-CM | POA: Diagnosis not present

## 2022-01-28 DIAGNOSIS — R29898 Other symptoms and signs involving the musculoskeletal system: Secondary | ICD-10-CM | POA: Diagnosis not present

## 2022-01-28 DIAGNOSIS — M1711 Unilateral primary osteoarthritis, right knee: Secondary | ICD-10-CM | POA: Diagnosis not present

## 2022-01-28 DIAGNOSIS — M21061 Valgus deformity, not elsewhere classified, right knee: Secondary | ICD-10-CM | POA: Diagnosis not present

## 2022-01-28 DIAGNOSIS — M47817 Spondylosis without myelopathy or radiculopathy, lumbosacral region: Secondary | ICD-10-CM | POA: Diagnosis not present

## 2022-01-28 DIAGNOSIS — M1611 Unilateral primary osteoarthritis, right hip: Secondary | ICD-10-CM | POA: Diagnosis not present

## 2022-01-30 DIAGNOSIS — M1711 Unilateral primary osteoarthritis, right knee: Secondary | ICD-10-CM | POA: Diagnosis not present

## 2022-01-30 DIAGNOSIS — M1611 Unilateral primary osteoarthritis, right hip: Secondary | ICD-10-CM | POA: Diagnosis not present

## 2022-01-30 DIAGNOSIS — M21061 Valgus deformity, not elsewhere classified, right knee: Secondary | ICD-10-CM | POA: Diagnosis not present

## 2022-01-30 DIAGNOSIS — R29898 Other symptoms and signs involving the musculoskeletal system: Secondary | ICD-10-CM | POA: Diagnosis not present

## 2022-01-30 DIAGNOSIS — M47817 Spondylosis without myelopathy or radiculopathy, lumbosacral region: Secondary | ICD-10-CM | POA: Diagnosis not present

## 2022-02-03 ENCOUNTER — Other Ambulatory Visit: Payer: Self-pay | Admitting: Cardiology

## 2022-02-03 DIAGNOSIS — M1711 Unilateral primary osteoarthritis, right knee: Secondary | ICD-10-CM | POA: Diagnosis not present

## 2022-02-03 DIAGNOSIS — M1611 Unilateral primary osteoarthritis, right hip: Secondary | ICD-10-CM | POA: Diagnosis not present

## 2022-02-03 DIAGNOSIS — M21061 Valgus deformity, not elsewhere classified, right knee: Secondary | ICD-10-CM | POA: Diagnosis not present

## 2022-02-03 DIAGNOSIS — R29898 Other symptoms and signs involving the musculoskeletal system: Secondary | ICD-10-CM | POA: Diagnosis not present

## 2022-02-03 DIAGNOSIS — M47817 Spondylosis without myelopathy or radiculopathy, lumbosacral region: Secondary | ICD-10-CM | POA: Diagnosis not present

## 2022-02-05 ENCOUNTER — Other Ambulatory Visit: Payer: Self-pay | Admitting: Cardiology

## 2022-02-05 DIAGNOSIS — E782 Mixed hyperlipidemia: Secondary | ICD-10-CM | POA: Diagnosis not present

## 2022-02-05 DIAGNOSIS — E7849 Other hyperlipidemia: Secondary | ICD-10-CM | POA: Diagnosis not present

## 2022-02-05 DIAGNOSIS — K2100 Gastro-esophageal reflux disease with esophagitis, without bleeding: Secondary | ICD-10-CM | POA: Diagnosis not present

## 2022-02-05 DIAGNOSIS — E041 Nontoxic single thyroid nodule: Secondary | ICD-10-CM | POA: Diagnosis not present

## 2022-02-05 DIAGNOSIS — I10 Essential (primary) hypertension: Secondary | ICD-10-CM | POA: Diagnosis not present

## 2022-02-06 DIAGNOSIS — M21061 Valgus deformity, not elsewhere classified, right knee: Secondary | ICD-10-CM | POA: Diagnosis not present

## 2022-02-06 DIAGNOSIS — M1711 Unilateral primary osteoarthritis, right knee: Secondary | ICD-10-CM | POA: Diagnosis not present

## 2022-02-06 DIAGNOSIS — M1611 Unilateral primary osteoarthritis, right hip: Secondary | ICD-10-CM | POA: Diagnosis not present

## 2022-02-06 DIAGNOSIS — M47817 Spondylosis without myelopathy or radiculopathy, lumbosacral region: Secondary | ICD-10-CM | POA: Diagnosis not present

## 2022-02-06 DIAGNOSIS — R29898 Other symptoms and signs involving the musculoskeletal system: Secondary | ICD-10-CM | POA: Diagnosis not present

## 2022-02-10 DIAGNOSIS — R29898 Other symptoms and signs involving the musculoskeletal system: Secondary | ICD-10-CM | POA: Diagnosis not present

## 2022-02-10 DIAGNOSIS — M1711 Unilateral primary osteoarthritis, right knee: Secondary | ICD-10-CM | POA: Diagnosis not present

## 2022-02-10 DIAGNOSIS — M1611 Unilateral primary osteoarthritis, right hip: Secondary | ICD-10-CM | POA: Diagnosis not present

## 2022-02-10 DIAGNOSIS — M47817 Spondylosis without myelopathy or radiculopathy, lumbosacral region: Secondary | ICD-10-CM | POA: Diagnosis not present

## 2022-02-10 DIAGNOSIS — M21061 Valgus deformity, not elsewhere classified, right knee: Secondary | ICD-10-CM | POA: Diagnosis not present

## 2022-02-11 DIAGNOSIS — E7849 Other hyperlipidemia: Secondary | ICD-10-CM | POA: Diagnosis not present

## 2022-02-11 DIAGNOSIS — I10 Essential (primary) hypertension: Secondary | ICD-10-CM | POA: Diagnosis not present

## 2022-02-11 DIAGNOSIS — E042 Nontoxic multinodular goiter: Secondary | ICD-10-CM | POA: Diagnosis not present

## 2022-02-11 DIAGNOSIS — G2581 Restless legs syndrome: Secondary | ICD-10-CM | POA: Diagnosis not present

## 2022-02-11 DIAGNOSIS — I482 Chronic atrial fibrillation, unspecified: Secondary | ICD-10-CM | POA: Diagnosis not present

## 2022-02-11 DIAGNOSIS — I340 Nonrheumatic mitral (valve) insufficiency: Secondary | ICD-10-CM | POA: Diagnosis not present

## 2022-02-11 DIAGNOSIS — Z6829 Body mass index (BMI) 29.0-29.9, adult: Secondary | ICD-10-CM | POA: Diagnosis not present

## 2022-02-11 DIAGNOSIS — N1832 Chronic kidney disease, stage 3b: Secondary | ICD-10-CM | POA: Diagnosis not present

## 2022-02-11 DIAGNOSIS — J309 Allergic rhinitis, unspecified: Secondary | ICD-10-CM | POA: Diagnosis not present

## 2022-02-13 DIAGNOSIS — M1611 Unilateral primary osteoarthritis, right hip: Secondary | ICD-10-CM | POA: Diagnosis not present

## 2022-02-13 DIAGNOSIS — M1711 Unilateral primary osteoarthritis, right knee: Secondary | ICD-10-CM | POA: Diagnosis not present

## 2022-02-13 DIAGNOSIS — M47817 Spondylosis without myelopathy or radiculopathy, lumbosacral region: Secondary | ICD-10-CM | POA: Diagnosis not present

## 2022-02-13 DIAGNOSIS — R29898 Other symptoms and signs involving the musculoskeletal system: Secondary | ICD-10-CM | POA: Diagnosis not present

## 2022-02-13 DIAGNOSIS — M21061 Valgus deformity, not elsewhere classified, right knee: Secondary | ICD-10-CM | POA: Diagnosis not present

## 2022-02-17 ENCOUNTER — Ambulatory Visit (HOSPITAL_COMMUNITY)
Admission: RE | Admit: 2022-02-17 | Discharge: 2022-02-17 | Disposition: A | Discharge status: Home / Self Care | Payer: Medicare PPO | Source: Ambulatory Visit | Attending: Adult Health | Admitting: Adult Health

## 2022-02-17 DIAGNOSIS — Z1231 Encounter for screening mammogram for malignant neoplasm of breast: Secondary | ICD-10-CM | POA: Insufficient documentation

## 2022-02-18 DIAGNOSIS — G8929 Other chronic pain: Secondary | ICD-10-CM | POA: Diagnosis not present

## 2022-02-18 DIAGNOSIS — M1611 Unilateral primary osteoarthritis, right hip: Secondary | ICD-10-CM | POA: Diagnosis not present

## 2022-02-18 DIAGNOSIS — M545 Low back pain, unspecified: Secondary | ICD-10-CM | POA: Diagnosis not present

## 2022-02-18 DIAGNOSIS — M47817 Spondylosis without myelopathy or radiculopathy, lumbosacral region: Secondary | ICD-10-CM | POA: Diagnosis not present

## 2022-02-18 DIAGNOSIS — M1711 Unilateral primary osteoarthritis, right knee: Secondary | ICD-10-CM | POA: Diagnosis not present

## 2022-02-19 DIAGNOSIS — M21061 Valgus deformity, not elsewhere classified, right knee: Secondary | ICD-10-CM | POA: Diagnosis not present

## 2022-02-19 DIAGNOSIS — M1711 Unilateral primary osteoarthritis, right knee: Secondary | ICD-10-CM | POA: Diagnosis not present

## 2022-02-19 DIAGNOSIS — R29898 Other symptoms and signs involving the musculoskeletal system: Secondary | ICD-10-CM | POA: Diagnosis not present

## 2022-02-19 DIAGNOSIS — M1611 Unilateral primary osteoarthritis, right hip: Secondary | ICD-10-CM | POA: Diagnosis not present

## 2022-02-19 DIAGNOSIS — M47817 Spondylosis without myelopathy or radiculopathy, lumbosacral region: Secondary | ICD-10-CM | POA: Diagnosis not present

## 2022-02-21 DIAGNOSIS — M1611 Unilateral primary osteoarthritis, right hip: Secondary | ICD-10-CM | POA: Diagnosis not present

## 2022-02-21 DIAGNOSIS — R29898 Other symptoms and signs involving the musculoskeletal system: Secondary | ICD-10-CM | POA: Diagnosis not present

## 2022-02-21 DIAGNOSIS — M1711 Unilateral primary osteoarthritis, right knee: Secondary | ICD-10-CM | POA: Diagnosis not present

## 2022-02-21 DIAGNOSIS — M21061 Valgus deformity, not elsewhere classified, right knee: Secondary | ICD-10-CM | POA: Diagnosis not present

## 2022-02-21 DIAGNOSIS — M47817 Spondylosis without myelopathy or radiculopathy, lumbosacral region: Secondary | ICD-10-CM | POA: Diagnosis not present

## 2022-02-25 DIAGNOSIS — M1611 Unilateral primary osteoarthritis, right hip: Secondary | ICD-10-CM | POA: Diagnosis not present

## 2022-02-25 DIAGNOSIS — M21061 Valgus deformity, not elsewhere classified, right knee: Secondary | ICD-10-CM | POA: Diagnosis not present

## 2022-02-25 DIAGNOSIS — M47817 Spondylosis without myelopathy or radiculopathy, lumbosacral region: Secondary | ICD-10-CM | POA: Diagnosis not present

## 2022-02-25 DIAGNOSIS — R29898 Other symptoms and signs involving the musculoskeletal system: Secondary | ICD-10-CM | POA: Diagnosis not present

## 2022-02-25 DIAGNOSIS — M1711 Unilateral primary osteoarthritis, right knee: Secondary | ICD-10-CM | POA: Diagnosis not present

## 2022-03-03 DIAGNOSIS — R29898 Other symptoms and signs involving the musculoskeletal system: Secondary | ICD-10-CM | POA: Diagnosis not present

## 2022-03-03 DIAGNOSIS — M1611 Unilateral primary osteoarthritis, right hip: Secondary | ICD-10-CM | POA: Diagnosis not present

## 2022-03-03 DIAGNOSIS — M47817 Spondylosis without myelopathy or radiculopathy, lumbosacral region: Secondary | ICD-10-CM | POA: Diagnosis not present

## 2022-03-03 DIAGNOSIS — M21061 Valgus deformity, not elsewhere classified, right knee: Secondary | ICD-10-CM | POA: Diagnosis not present

## 2022-03-03 DIAGNOSIS — M1711 Unilateral primary osteoarthritis, right knee: Secondary | ICD-10-CM | POA: Diagnosis not present

## 2022-03-06 DIAGNOSIS — M1611 Unilateral primary osteoarthritis, right hip: Secondary | ICD-10-CM | POA: Diagnosis not present

## 2022-03-06 DIAGNOSIS — M1711 Unilateral primary osteoarthritis, right knee: Secondary | ICD-10-CM | POA: Diagnosis not present

## 2022-03-06 DIAGNOSIS — R29898 Other symptoms and signs involving the musculoskeletal system: Secondary | ICD-10-CM | POA: Diagnosis not present

## 2022-03-06 DIAGNOSIS — M47817 Spondylosis without myelopathy or radiculopathy, lumbosacral region: Secondary | ICD-10-CM | POA: Diagnosis not present

## 2022-03-06 DIAGNOSIS — M21061 Valgus deformity, not elsewhere classified, right knee: Secondary | ICD-10-CM | POA: Diagnosis not present

## 2022-03-11 DIAGNOSIS — M47817 Spondylosis without myelopathy or radiculopathy, lumbosacral region: Secondary | ICD-10-CM | POA: Diagnosis not present

## 2022-03-11 DIAGNOSIS — M21061 Valgus deformity, not elsewhere classified, right knee: Secondary | ICD-10-CM | POA: Diagnosis not present

## 2022-03-11 DIAGNOSIS — R29898 Other symptoms and signs involving the musculoskeletal system: Secondary | ICD-10-CM | POA: Diagnosis not present

## 2022-03-11 DIAGNOSIS — M1611 Unilateral primary osteoarthritis, right hip: Secondary | ICD-10-CM | POA: Diagnosis not present

## 2022-03-11 DIAGNOSIS — M1711 Unilateral primary osteoarthritis, right knee: Secondary | ICD-10-CM | POA: Diagnosis not present

## 2022-03-13 DIAGNOSIS — M47817 Spondylosis without myelopathy or radiculopathy, lumbosacral region: Secondary | ICD-10-CM | POA: Diagnosis not present

## 2022-03-13 DIAGNOSIS — M21061 Valgus deformity, not elsewhere classified, right knee: Secondary | ICD-10-CM | POA: Diagnosis not present

## 2022-03-13 DIAGNOSIS — R29898 Other symptoms and signs involving the musculoskeletal system: Secondary | ICD-10-CM | POA: Diagnosis not present

## 2022-03-13 DIAGNOSIS — M1711 Unilateral primary osteoarthritis, right knee: Secondary | ICD-10-CM | POA: Diagnosis not present

## 2022-03-13 DIAGNOSIS — M1611 Unilateral primary osteoarthritis, right hip: Secondary | ICD-10-CM | POA: Diagnosis not present

## 2022-03-18 DIAGNOSIS — M1711 Unilateral primary osteoarthritis, right knee: Secondary | ICD-10-CM | POA: Diagnosis not present

## 2022-03-18 DIAGNOSIS — M1611 Unilateral primary osteoarthritis, right hip: Secondary | ICD-10-CM | POA: Diagnosis not present

## 2022-03-18 DIAGNOSIS — M21061 Valgus deformity, not elsewhere classified, right knee: Secondary | ICD-10-CM | POA: Diagnosis not present

## 2022-03-18 DIAGNOSIS — M47817 Spondylosis without myelopathy or radiculopathy, lumbosacral region: Secondary | ICD-10-CM | POA: Diagnosis not present

## 2022-03-18 DIAGNOSIS — R29898 Other symptoms and signs involving the musculoskeletal system: Secondary | ICD-10-CM | POA: Diagnosis not present

## 2022-04-08 DIAGNOSIS — M47817 Spondylosis without myelopathy or radiculopathy, lumbosacral region: Secondary | ICD-10-CM | POA: Diagnosis not present

## 2022-04-08 DIAGNOSIS — M1711 Unilateral primary osteoarthritis, right knee: Secondary | ICD-10-CM | POA: Diagnosis not present

## 2022-04-08 DIAGNOSIS — M545 Low back pain, unspecified: Secondary | ICD-10-CM | POA: Diagnosis not present

## 2022-04-08 DIAGNOSIS — G8929 Other chronic pain: Secondary | ICD-10-CM | POA: Diagnosis not present

## 2022-04-08 DIAGNOSIS — M1611 Unilateral primary osteoarthritis, right hip: Secondary | ICD-10-CM | POA: Diagnosis not present

## 2022-04-14 NOTE — Progress Notes (Unsigned)
GI Office Note    Referring Provider: Richardean Chimera, MD Primary Care Physician:  Richardean Chimera, MD Primary Gastroenterologist: Dr. Jena Gauss  Date:  04/15/2022  ID:  Hailey [name], DOB [birth year], MRN [id number]   Chief Complaint   Chief Complaint  Patient presents with   Follow-up    Wakes up at night with a burning feeling in her chest and food is getting stuck in throat. She thinks she has been biting her tongue, because it is sore. Would like someone to take a look at it.     History of Present Illness  Hailey [name] is a 81 y.o. female with a history of GERD, dysphagia, A-fib on Eliquis, HTN, HLD, Mnire's disease, and restless leg presenting today with her follow-up.  Last EGD November 2021 with benign gastric polyps and normal esophagus s/p dilation.  Last seen in the office 10/29/2021.  Overall she is doing well overall, denied any nausea/vomiting/esophageal burning, coughing, abdominal pain, melena, hematochezia.  Reported her symptoms were controlled on omeprazole 20 mg twice daily and was not interested in changing her medication at that time.  It is noted that she has had previous failure of many other PPIs due to side effects including Dexilant, Nexium, and pantoprazole.  Pepcid and Carafate not previously helpful either.  She did note some intermittent solid food dysphagia but does not feel the need to undergo any stretching.  Antireflux lifestyle modifications reinforced.  Dysphagia precautions also discussed.   Today: Still having a little chest burning that happens at night and a stretching feeling but does not take tums - after moving around or rubbing it it will go away. Does report some issues with swallowing. Intermittent swallowing issues continue, denies choking episodes. Reports she ate something with rice in it a few months ago and had to throw that up. Reports after swallowing it feels like it is stuck and hurts to go all the way down. Had fries today and that  hurt but she knows things like that do bother her. She reports she knows her trigger foods abut sometimes still eats them. Does not want to try anything else or change her medication. Had issues increasing the omeprazole doses in the past. Has not needed zofran lately.   Reports some tongue pain that has been going on for a while. Reports she asked the dental hygienist and she had an xray and never heard anything after 6 months. She reports she feels like she may have bit it. Changed up her night guard about 6 months ago. Pain is on the right side. Pain was before this however.   Was recently diagnosed with arthritis. Using diclofenac cream and ice/heat.   Current Outpatient Medications  Medication Sig Dispense Refill   acetaminophen (TYLENOL) 650 MG CR tablet Take 1,300 mg by mouth 2 (two) times daily.     apixaban (ELIQUIS) 5 MG TABS tablet Take 5 mg by mouth 2 (two) times daily.     Calcium Carb-Cholecalciferol (CALCIUM 600+D3 PO) Take 1 tablet by mouth daily.      diclofenac Sodium (VOLTAREN) 1 % GEL Apply topically as directed.     diltiazem (CARDIZEM CD) 180 MG 24 hr capsule TAKE 1 CAPSULE BY MOUTH EVERY DAY 90 capsule 1   flecainide (TAMBOCOR) 50 MG tablet TAKE 1 TABLET BY MOUTH TWICE A DAY 180 tablet 3   gabapentin (NEURONTIN) 100 MG capsule Take 100 mg by mouth at bedtime.     Multiple Vitamin (MULTIVITAMIN WITH  MINERALS) TABS tablet Take 1 tablet by mouth daily. Centrum Silver for Women     multivitamin-lutein (OCUVITE-LUTEIN) CAPS capsule Take 1 capsule by mouth daily.     omeprazole (PRILOSEC) 20 MG capsule Take 20 mg by mouth 2 (two) times daily before a meal.     ondansetron (ZOFRAN ODT) 8 MG disintegrating tablet 8mg  ODT q4 hours prn nausea 8 tablet 0   calcium carbonate (TUMS - DOSED IN MG ELEMENTAL CALCIUM) 500 MG chewable tablet Chew 1 tablet by mouth as needed for indigestion or heartburn.  (Patient not taking: Reported on 04/15/2022)     No current facility-administered  medications for this visit.    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   Migraine headache    Palpitations    Restless leg syndrome    Vaginal atrophy     Past Surgical History:  Procedure Laterality Date   APPENDECTOMY     ATRIAL FIBRILLATION ABLATION N/A 08/20/2018   Procedure: ATRIAL FIBRILLATION ABLATION;  Surgeon: 08/22/2018, MD;  Location: [facility];  Service: Cardiovascular;  Laterality: N/A;   ATRIAL FIBRILLATION ABLATION N/A 05/11/2019   Procedure: ATRIAL FIBRILLATION ABLATION;  Surgeon: 07/11/2019, MD;  Location: [facility];  Service: Cardiovascular;  Laterality: N/A;   BIOPSY  06/06/2020   Procedure: BIOPSY;  Surgeon: 13/10/2019, MD;  Location: [facility];  Service: Endoscopy;;  gastric   CARDIAC CATHETERIZATION  08/05/2003   cone   CARDIOVERSION N/A 05/02/2019   Procedure: CARDIOVERSION;  Surgeon: 05/04/2019, MD;  Location: [facility] ENDOSCOPY;  Service: Cardiovascular;  Laterality: N/A;   COLONOSCOPY     ESOPHAGOGASTRODUODENOSCOPY N/A 06/06/2020   benign gastric polyps and normal esophagus status post dilation, normal duodenum   HERNIA REPAIR     LEFT HEART CATHETERIZATION WITH CORONARY ANGIOGRAM N/A 04/12/2012   Procedure: LEFT HEART CATHETERIZATION WITH CORONARY ANGIOGRAM;  Surgeon: 06/12/2012, MD;  Location: [facility] CATH LAB;  Service: Cardiovascular;  Laterality: N/A;   MALONEY DILATION N/A 06/06/2020   Procedure: 13/10/2019 DILATION;  Surgeon: [name], MD;  Location: [facility];  Service: Endoscopy;  Laterality: N/A;   POLYPECTOMY  06/06/2020   Procedure: POLYPECTOMY;  Surgeon: 13/10/2019, MD;  Location: [facility];  Service: Endoscopy;;  gastric   TOTAL ABDOMINAL HYSTERECTOMY      Family History  Problem Relation Age of Onset    Coronary artery disease Father        mid 44s   Hypertension Mother    Stroke Brother    Transient ischemic attack Brother    Other Brother        on oxygen   Hypertension Brother    Stroke Brother    Heart disease Brother    Heart attack Brother    Other Sister        a fib, restless leg   Other Sister        pressure in eyes   Colon cancer Neg Hx    Gastric cancer Neg Hx    Esophageal cancer Neg Hx     Allergies as of 04/15/2022 - Review Complete 04/15/2022  Allergen Reaction Noted   Amiodarone Itching 04/24/2018   Dexilant [dexlansoprazole] Diarrhea and Other (See Comments) 07/17/2020   Flomax [  tamsulosin hcl] Nausea And Vomiting 01/24/2015   Nexium [esomeprazole] Other (See Comments) 01/02/2021   Pantoprazole sodium Other (See Comments) 07/17/2020    Social History   Socioeconomic History   Marital status: Single    Spouse name: Not on file   Number of children: 0   Years of education: College   Highest education level: Not on file  Occupational History   Occupation: retired  Tobacco Use   Smoking status: Never   Smokeless tobacco: Never  Vaping Use   Vaping Use: Never used  Substance and Sexual Activity   Alcohol use: Not Currently    Comment: rarely; glass of wine with dinner   Drug use: No   Sexual activity: Never    Birth control/protection: Surgical    Comment: hyst  Other Topics Concern   Not on file  Social History Narrative   Single. Retired.    Patient lives at home alone.   Caffeine Use: none   Social Determinants of Corporate investment banker Strain: Not on file  Food Insecurity: Not on file  Transportation Needs: Not on file  Physical Activity: Not on file  Stress: Not on file  Social Connections: Not on file     Review of Systems   Gen: Denies fever, chills, anorexia. Denies fatigue, weakness, weight loss.  CV: Denies chest pain, palpitations, syncope, peripheral edema, and claudication. Resp: Denies dyspnea at rest, cough,  wheezing, coughing up blood, and pleurisy. GI: See HPI Derm: Denies rash, itching, dry skin Psych: Denies depression, anxiety, memory loss, confusion. No homicidal or suicidal ideation.  Heme: Denies bruising, bleeding, and enlarged lymph nodes.   Physical Exam   BP (!) 144/74 (BP Location: Left Arm, Patient Position: Sitting, Cuff Size: Normal)   Pulse 86   Temp 97.6 F (36.4 C) (Temporal)   Ht 5\' 7"  (1.702 m)   Wt 192 lb 3.2 oz (87.2 kg)   SpO2 99%   BMI 30.10 kg/m   General:   Alert and oriented. No distress noted. Pleasant and cooperative.  Head:  Normocephalic and atraumatic. Eyes:  Conjuctiva clear without scleral icterus. Mouth:  Oral mucosa pink and moist. Good dentition. No lesions. Lungs:  Clear to auscultation bilaterally. No wheezes, rales, or rhonchi. No distress.  Heart:  S1, S2 present without murmurs appreciated.  Abdomen:  +BS, soft, non-tender and non-distended. No rebound or guarding. No HSM or masses noted. Rectal: deferred Msk:  Symmetrical without gross deformities. Normal posture. Extremities:  Without edema. Neurologic:  Alert and  oriented x4 Psych:  Alert and cooperative. Normal mood and affect.   Assessment  Hailey [name] is a 81 y.o. female with a history of  GERD, dysphagia, A-fib on Eliquis, HTN, HLD, Mnire's disease, and restless leg presenting today with her follow-up.  Dysphagia/GERD: Still having some intermittent issues with swallowing solids.  Reports that sometimes she feels some pain/burning about mid chest and into her stomach feeling food is slowly moving down.  Currently on omeprazole twice daily for her reflux and continues to have some intermittent chest burning at night but has not been taking her Tums, usually just moves around or rubs it and will generally go away and she is able to go back to sleep.  She states that 2 months ago she tried some rice and immediately threw that up but has not had any other issues since then.  Today  she reports she had some 94 fries from [employer] and that she began having some burning  midway down her chest into her stomach.  Typically has issues when she eats things that she knows is bothersome.  Denies any nausea or vomiting and is not needing Zofran recently.  Not interested in attempting to change any medications at this time.  Advised that we could perform barium pill esophagram to further evaluate her swallowing issue for possible structural abnormality or dysmotility.  Advised her to keep Tums at her bedside at night for breakthrough symptoms as needed.   PLAN    Barium pill esophagram  Continue omeprazole 20 mg daily.  Continue GERD diet and lifestyle modifications. Use tums as needed.  Follow up in 6 months.     Brooke Bonito, MSN, FNP-BC, AGACNP-BC [facility]

## 2022-04-15 ENCOUNTER — Encounter: Payer: Self-pay | Admitting: Gastroenterology

## 2022-04-15 ENCOUNTER — Ambulatory Visit: Payer: Medicare PPO | Admitting: Gastroenterology

## 2022-04-15 VITALS — BP 144/74 | HR 86 | Temp 97.6°F | Ht 67.0 in | Wt 192.2 lb

## 2022-04-15 DIAGNOSIS — K219 Gastro-esophageal reflux disease without esophagitis: Secondary | ICD-10-CM | POA: Diagnosis not present

## 2022-04-15 DIAGNOSIS — R1319 Other dysphagia: Secondary | ICD-10-CM

## 2022-04-15 DIAGNOSIS — R1013 Epigastric pain: Secondary | ICD-10-CM

## 2022-04-15 NOTE — Patient Instructions (Addendum)
We will schedule your barium pill esophagram to further evaluate your swallowing.  You should continue your omeprazole 20 mg daily, 30 minutes prior to breakfast and dinner.  If you are having more symptoms at night, please keep some Tums at your bedside to help alleviate your symptoms.  We will be in contact with your results of your barium pill esophagram once they have been received and I have had time to review it.  We will have further recommendations after that.  We will plan for follow-up in 6 months sooner if needed!  It was a pleasure to see you today. I want to create trusting relationships with patients. If you receive a survey regarding your visit,  I greatly appreciate you taking time to fill this out on paper or through your MyChart. I value your feedback.  Brooke Bonito, MSN, FNP-BC, AGACNP-BC [facility]

## 2022-04-16 ENCOUNTER — Telehealth: Payer: Self-pay | Admitting: *Deleted

## 2022-04-16 NOTE — Telephone Encounter (Signed)
Called pt and LMOVM with appt details for BPE

## 2022-04-18 DIAGNOSIS — L820 Inflamed seborrheic keratosis: Secondary | ICD-10-CM | POA: Diagnosis not present

## 2022-04-18 DIAGNOSIS — D0359 Melanoma in situ of other part of trunk: Secondary | ICD-10-CM | POA: Diagnosis not present

## 2022-04-18 DIAGNOSIS — Z85828 Personal history of other malignant neoplasm of skin: Secondary | ICD-10-CM | POA: Diagnosis not present

## 2022-04-18 DIAGNOSIS — L814 Other melanin hyperpigmentation: Secondary | ICD-10-CM | POA: Diagnosis not present

## 2022-04-18 DIAGNOSIS — L570 Actinic keratosis: Secondary | ICD-10-CM | POA: Diagnosis not present

## 2022-04-18 DIAGNOSIS — C4441 Basal cell carcinoma of skin of scalp and neck: Secondary | ICD-10-CM | POA: Diagnosis not present

## 2022-04-18 DIAGNOSIS — L821 Other seborrheic keratosis: Secondary | ICD-10-CM | POA: Diagnosis not present

## 2022-04-18 DIAGNOSIS — D1801 Hemangioma of skin and subcutaneous tissue: Secondary | ICD-10-CM | POA: Diagnosis not present

## 2022-04-21 ENCOUNTER — Encounter: Payer: Self-pay | Admitting: *Deleted

## 2022-04-22 ENCOUNTER — Ambulatory Visit (HOSPITAL_COMMUNITY)
Admission: RE | Admit: 2022-04-22 | Discharge: 2022-04-22 | Disposition: A | Discharge status: Home / Self Care | Payer: Medicare PPO | Source: Ambulatory Visit | Attending: Gastroenterology | Admitting: Gastroenterology

## 2022-04-22 DIAGNOSIS — R1319 Other dysphagia: Secondary | ICD-10-CM | POA: Insufficient documentation

## 2022-04-22 DIAGNOSIS — K224 Dyskinesia of esophagus: Secondary | ICD-10-CM | POA: Diagnosis not present

## 2022-04-23 ENCOUNTER — Other Ambulatory Visit (HOSPITAL_COMMUNITY): Payer: Medicare PPO

## 2022-04-23 ENCOUNTER — Telehealth: Payer: Self-pay | Admitting: *Deleted

## 2022-04-23 DIAGNOSIS — G8929 Other chronic pain: Secondary | ICD-10-CM | POA: Diagnosis not present

## 2022-04-23 DIAGNOSIS — M1611 Unilateral primary osteoarthritis, right hip: Secondary | ICD-10-CM | POA: Diagnosis not present

## 2022-04-23 DIAGNOSIS — M545 Low back pain, unspecified: Secondary | ICD-10-CM | POA: Diagnosis not present

## 2022-04-23 DIAGNOSIS — M47817 Spondylosis without myelopathy or radiculopathy, lumbosacral region: Secondary | ICD-10-CM | POA: Diagnosis not present

## 2022-04-23 DIAGNOSIS — M1711 Unilateral primary osteoarthritis, right knee: Secondary | ICD-10-CM | POA: Diagnosis not present

## 2022-04-23 NOTE — Telephone Encounter (Signed)
   Pre-operative Risk Assessment    Patient Name: Hailey [name]  DOB: 1940/09/14 MRN: [id number]      Request for Surgical Clearance    Procedure:   STEROID KNEE INJECTION  Date of Surgery:  Clearance TBD                                 Surgeon:   Surgeon's Group or Practice Name:  [facility] Phone number:  [phone] Fax number:  [phone]   Type of Clearance Requested:   - Medical  - Pharmacy:  Hold Apixaban (Eliquis) NOT INDICATED HOW LONG   Type of Anesthesia:  Not Indicated   Additional requests/questions:    Astrid Divine   04/23/2022, 1:04 PM

## 2022-04-30 NOTE — Telephone Encounter (Signed)
   Name: Hailey [name]  DOB: 04-Jan-1941  MRN: [id number]  Primary Cardiologist: Kate Sable, MD (Inactive)   Preoperative team, please contact this patient and set up a phone call appointment for further preoperative risk assessment. Please obtain consent and complete medication review. Thank you for your help.  I confirm that guidance regarding antiplatelet and oral anticoagulation therapy has been completed and, if necessary, noted below.  Patient with diagnosis of atrial fibrillation on Eliquis for anticoagulation.     Procedure: steroid knee injection Date of procedure: TBD     CHA2DS2-VASc Score = 5   This indicates a 7.2% annual risk of stroke. The patient's score is based upon: CHF History: 0 HTN History: 1 Diabetes History: 0 Stroke History: 0 Vascular Disease History: 1 Age Score: 2 Gender Score: 1   CrCl 48 Platelet count 265   Per office protocol, patient can hold Eliquis for 2 days prior to procedure.   Patient will not need bridging with Lovenox (enoxaparin) around procedure.   Lenna Sciara, NP 04/30/2022, 1:52 PM [place]

## 2022-04-30 NOTE — Telephone Encounter (Signed)
Patient with diagnosis of atrial fibrillation on Eliquis for anticoagulation.    Procedure: steroid knee injection Date of procedure: TBD   CHA2DS2-VASc Score = 5   This indicates a 7.2% annual risk of stroke. The patient's score is based upon: CHF History: 0 HTN History: 1 Diabetes History: 0 Stroke History: 0 Vascular Disease History: 1 Age Score: 2 Gender Score: 1  CrCl 48 Platelet count 265  Per office protocol, patient can hold Eliquis for 2 days prior to procedure.   Patient will not need bridging with Lovenox (enoxaparin) around procedure.  **This guidance is not considered finalized until pre-operative APP has relayed final recommendations.**

## 2022-05-02 ENCOUNTER — Telehealth: Payer: Self-pay

## 2022-05-02 NOTE — Telephone Encounter (Signed)
  Patient Consent for Virtual Visit        Hailey [name] has provided verbal consent on 05/02/2022 for a virtual visit (video or telephone).   CONSENT FOR VIRTUAL VISIT FOR:  Hailey [name]  By participating in this virtual visit I agree to the following:  I hereby voluntarily request, consent and authorize [facility] and its employed or contracted physicians, physician assistants, nurse practitioners or other licensed health care professionals (the Practitioner), to provide me with telemedicine health care services (the "Services") as deemed necessary by the treating Practitioner. I acknowledge and consent to receive the Services by the Practitioner via telemedicine. I understand that the telemedicine visit will involve communicating with the Practitioner through live audiovisual communication technology and the disclosure of certain medical information by electronic transmission. I acknowledge that I have been given the opportunity to request an in-person assessment or other available alternative prior to the telemedicine visit and am voluntarily participating in the telemedicine visit.  I understand that I have the right to withhold or withdraw my consent to the use of telemedicine in the course of my care at any time, without affecting my right to future care or treatment, and that the Practitioner or I may terminate the telemedicine visit at any time. I understand that I have the right to inspect all information obtained and/or recorded in the course of the telemedicine visit and may receive copies of available information for a reasonable fee.  I understand that some of the potential risks of receiving the Services via telemedicine include:  Delay or interruption in medical evaluation due to technological equipment failure or disruption; Information transmitted may not be sufficient (e.g. poor resolution of images) to allow for appropriate medical decision making by the  Practitioner; and/or  In rare instances, security protocols could fail, causing a breach of personal health information.  Furthermore, I acknowledge that it is my responsibility to provide information about my medical history, conditions and care that is complete and accurate to the best of my ability. I acknowledge that Practitioner's advice, recommendations, and/or decision may be based on factors not within their control, such as incomplete or inaccurate data provided by me or distortions of diagnostic images or specimens that may result from electronic transmissions. I understand that the practice of medicine is not an exact science and that Practitioner makes no warranties or guarantees regarding treatment outcomes. I acknowledge that a copy of this consent can be made available to me via my patient portal ([facility]), or I can request a printed copy by calling the office of [facility].    I understand that my insurance will be billed for this visit.   I have read or had this consent read to me. I understand the contents of this consent, which adequately explains the benefits and risks of the Services being provided via telemedicine.  I have been provided ample opportunity to ask questions regarding this consent and the Services and have had my questions answered to my satisfaction. I give my informed consent for the services to be provided through the use of telemedicine in my medical care

## 2022-05-02 NOTE — Telephone Encounter (Signed)
Spoke with patient who is agreeable to do a tele visit on 10/12 @ 10 a. Mec rec and consent have been done.

## 2022-05-06 DIAGNOSIS — D0359 Melanoma in situ of other part of trunk: Secondary | ICD-10-CM | POA: Diagnosis not present

## 2022-05-06 DIAGNOSIS — Z85828 Personal history of other malignant neoplasm of skin: Secondary | ICD-10-CM | POA: Diagnosis not present

## 2022-05-06 DIAGNOSIS — L988 Other specified disorders of the skin and subcutaneous tissue: Secondary | ICD-10-CM | POA: Diagnosis not present

## 2022-05-13 DIAGNOSIS — G894 Chronic pain syndrome: Secondary | ICD-10-CM | POA: Diagnosis not present

## 2022-05-13 DIAGNOSIS — M1611 Unilateral primary osteoarthritis, right hip: Secondary | ICD-10-CM | POA: Diagnosis not present

## 2022-05-13 DIAGNOSIS — M1711 Unilateral primary osteoarthritis, right knee: Secondary | ICD-10-CM | POA: Diagnosis not present

## 2022-05-13 DIAGNOSIS — M47816 Spondylosis without myelopathy or radiculopathy, lumbar region: Secondary | ICD-10-CM | POA: Diagnosis not present

## 2022-05-15 ENCOUNTER — Ambulatory Visit: Payer: Medicare PPO | Attending: Cardiology | Admitting: Physician Assistant

## 2022-05-15 DIAGNOSIS — H3530 Unspecified macular degeneration: Secondary | ICD-10-CM | POA: Diagnosis not present

## 2022-05-15 DIAGNOSIS — H353131 Nonexudative age-related macular degeneration, bilateral, early dry stage: Secondary | ICD-10-CM | POA: Diagnosis not present

## 2022-05-15 DIAGNOSIS — Z01810 Encounter for preprocedural cardiovascular examination: Secondary | ICD-10-CM | POA: Diagnosis not present

## 2022-05-15 NOTE — Progress Notes (Signed)
Virtual Visit via Telephone Note   Because of Hailey [name]'s co-morbid illnesses, she is at least at moderate risk for complications without adequate follow up.  This format is felt to be most appropriate for this patient at this time.  The patient did not have access to video technology/had technical difficulties with video requiring transitioning to audio format only (telephone).  All issues noted in this document were discussed and addressed.  No physical exam could be performed with this format.  Please refer to the patient's chart for her consent to telehealth for [facility].  Evaluation Performed:  Preoperative cardiovascular risk assessment _____________   Date:  05/15/2022   Patient ID:  Hailey [name], DOB 06/20/1941, MRN [id number] Patient Location:  Home Provider location:   Office  Primary Care Provider:  Richardean Chimera, MD Primary Cardiologist:  Prentice Docker, MD (Inactive)  Chief Complaint / Patient Profile   81 y.o. y/o female with a h/o atrial fibrillation, hyperlipidemia, hypertension, Mnire's disease who is pending knee steroid/gel injection and presents today for telephonic preoperative cardiovascular risk assessment.  Past Medical History    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   Migraine headache    Palpitations    Restless leg syndrome    Vaginal atrophy    Past Surgical History:  Procedure Laterality Date   APPENDECTOMY     ATRIAL FIBRILLATION ABLATION N/A 08/20/2018   Procedure: ATRIAL FIBRILLATION ABLATION;  Surgeon: Regan Lemming, MD;  [facility];  Service: Cardiovascular;  Laterality: N/A;   ATRIAL FIBRILLATION ABLATION N/A 05/11/2019   Procedure: ATRIAL FIBRILLATION ABLATION;  Surgeon: Regan Lemming, MD;  Location: [facility];   Service: Cardiovascular;  Laterality: N/A;   BIOPSY  06/06/2020   Procedure: BIOPSY;  Surgeon: Corbin Ade, MD;  [facility]: [facility];  Service: Endoscopy;;  gastric   CARDIAC CATHETERIZATION  08/05/2003   cone   CARDIOVERSION N/A 05/02/2019   Procedure: CARDIOVERSION;  Surgeon: Jodelle Red, MD;  Location[facility] ENDOSCOPY;  Service: Cardiovascular;  Laterality: N/A;   COLONOSCOPY     ESOPHAGOGASTRODUODENOSCOPY N/A 06/06/2020   benign gastric polyps and normal esophagus status post dilation, normal duodenum   HERNIA REPAIR     LEFT HEART CATHETERIZATION WITH CORONARY ANGIOGRAM N/A 04/12/2012   Procedure: LEFT HEART CATHETERIZATION WITH CORONARY ANGIOGRAM;  Surgeon: Kathleene Hazel, MD;  Location: [facility] CATH LAB;  Service: Cardiovascular;  Laterality: N/A;   MALONEY DILATION N/A 06/06/2020   Procedure: [name] DILATION;  Surgeon: Corbin Ade, MD;  [facility];  Service: Endoscopy;  Laterality: N/A;   POLYPECTOMY  06/06/2020   Procedure: POLYPECTOMY;  Surgeon: Corbin Ade, MD;  [facility]: [facility];  Service: Endoscopy;;  gastric   TOTAL ABDOMINAL HYSTERECTOMY      Allergies  Allergies  Allergen Reactions   Amiodarone Itching   Dexilant [Dexlansoprazole] Diarrhea and Other (See Comments)    Painful tongue   Flomax [Tamsulosin Hcl] Nausea And Vomiting   Nexium [Esomeprazole] Other (See Comments)    20mg  daily tolerated but with 40mg  daily she reported facial swelling, burning of nose/lips/cheeks.   Pantoprazole Sodium Other (See Comments)    "Makes me turn red"    History of Present Illness  Hailey [name] is a 81 y.o. female who presents via audio/video conferencing for a telehealth visit today.  Pt was last seen in cardiology clinic on 12/26/2021 by Dr. Elberta Fortis.  At that time Hailey [name] was doing well .  The patient is now pending procedure as outlined above. Since her last visit, she tells me she has not had any chest pain, shortness  of breath, or A-fib.  She feels a little bit sluggish in the morning but by the end of the day feels much better.  She has a couple of orthopedic issues she is currently dealing with.  She needs a knee injection and is oscillating between steroid versus gel.  She also needs an injection in her back and a hip injection.  She tells me that she does all her own grocery shopping and shopping in general.  She also can do stairs with a little bit of help.  She does her own indoor housework.  For this reason she scored a 5.62 METS on the DASI.  This exceeds the form is been requirement.  The patient explains she had a knee injection before and did not have to hold her Eliquis at that time.  She is cleared to hold her Eliquis x2 days prior to the procedure but of course we would prefer her not to have to hold it.  She can resume when medically safe to do so.   She told me that last time she had a steroid injection she had an episode that night where she felt her heart was racing and she felt flushed.  Likely had a short bout of atrial fibrillation.  She is considering doing gel injections at this time and hopes that it does not have the same effect.  Home Medications    Prior to Admission medications   Medication Sig Start Date End Date Taking? Authorizing Provider  acetaminophen (TYLENOL) 650 MG CR tablet Take 1,300 mg by mouth 2 (two) times daily.    [provider]  apixaban (ELIQUIS) 5 MG TABS tablet Take 5 mg by mouth 2 (two) times daily.    [provider]  Calcium Carb-Cholecalciferol (CALCIUM 600+D3 PO) Take 1 tablet by mouth daily.     [provider]  calcium carbonate (TUMS - DOSED IN MG ELEMENTAL CALCIUM) 500 MG chewable tablet Chew 1 tablet by mouth as needed for indigestion or heartburn.    [provider]  diclofenac Sodium (VOLTAREN) 1 % GEL Apply topically as directed.    [provider]  diltiazem (CARDIZEM CD) 180 MG 24 hr capsule TAKE 1 CAPSULE  BY MOUTH EVERY DAY 02/03/22   Camnitz, Andree Coss, MD  flecainide (TAMBOCOR) 50 MG tablet TAKE 1 TABLET BY MOUTH TWICE A DAY 02/05/22   Camnitz, Andree Coss, MD  gabapentin (NEURONTIN) 100 MG capsule Take 100 mg by mouth at bedtime.    [provider]  Multiple Vitamin (MULTIVITAMIN WITH MINERALS) TABS tablet Take 1 tablet by mouth daily. Centrum Silver for Women    [provider]  multivitamin-lutein (OCUVITE-LUTEIN) CAPS capsule Take 1 capsule by mouth daily.    [provider]  omeprazole (PRILOSEC) 20 MG capsule Take 20 mg by mouth 2 (two) times daily before a meal.    [provider]    Physical Exam    Vital Signs:  Hailey [name] does not have vital signs available for review today.  Given telephonic nature of communication, physical exam is limited. AAOx3. NAD. Normal  affect.  Speech and respirations are unlabored.  Accessory Clinical Findings    None  Assessment & Plan    1.  Preoperative Cardiovascular Risk Assessment:  Hailey [name] perioperative risk of a major cardiac event is 0.4% according to the Revised Cardiac Risk Index (RCRI).  Therefore, she is at low risk for perioperative complications.   Her functional capacity is good at 5.62 METs according to the [employer] Activity Status Index (DASI). Recommendations: According to ACC/AHA guidelines, no further cardiovascular testing needed.  The patient may proceed to surgery at acceptable risk.   Antiplatelet and/or Anticoagulation Recommendations:  Eliquis (Apixaban) can be held for 2 days prior to surgery.  Please resume post op when felt to be safe.     A copy of this note will be routed to requesting surgeon.  Time:   Today, I have spent 30 minutes with the patient with telehealth technology discussing medical history, symptoms, and management plan.     Elgie Collard, PA-C  05/15/2022, 10:06 AM

## 2022-06-16 DIAGNOSIS — M1711 Unilateral primary osteoarthritis, right knee: Secondary | ICD-10-CM | POA: Diagnosis not present

## 2022-06-18 DIAGNOSIS — Z1329 Encounter for screening for other suspected endocrine disorder: Secondary | ICD-10-CM | POA: Diagnosis not present

## 2022-06-18 DIAGNOSIS — R5382 Chronic fatigue, unspecified: Secondary | ICD-10-CM | POA: Diagnosis not present

## 2022-06-18 DIAGNOSIS — E7849 Other hyperlipidemia: Secondary | ICD-10-CM | POA: Diagnosis not present

## 2022-06-18 DIAGNOSIS — N1832 Chronic kidney disease, stage 3b: Secondary | ICD-10-CM | POA: Diagnosis not present

## 2022-06-18 DIAGNOSIS — K2100 Gastro-esophageal reflux disease with esophagitis, without bleeding: Secondary | ICD-10-CM | POA: Diagnosis not present

## 2022-06-18 DIAGNOSIS — I10 Essential (primary) hypertension: Secondary | ICD-10-CM | POA: Diagnosis not present

## 2022-06-25 DIAGNOSIS — I10 Essential (primary) hypertension: Secondary | ICD-10-CM | POA: Diagnosis not present

## 2022-06-25 DIAGNOSIS — N1832 Chronic kidney disease, stage 3b: Secondary | ICD-10-CM | POA: Diagnosis not present

## 2022-06-25 DIAGNOSIS — I482 Chronic atrial fibrillation, unspecified: Secondary | ICD-10-CM | POA: Diagnosis not present

## 2022-06-25 DIAGNOSIS — E7849 Other hyperlipidemia: Secondary | ICD-10-CM | POA: Diagnosis not present

## 2022-06-25 DIAGNOSIS — J309 Allergic rhinitis, unspecified: Secondary | ICD-10-CM | POA: Diagnosis not present

## 2022-06-25 DIAGNOSIS — E042 Nontoxic multinodular goiter: Secondary | ICD-10-CM | POA: Diagnosis not present

## 2022-06-25 DIAGNOSIS — G2581 Restless legs syndrome: Secondary | ICD-10-CM | POA: Diagnosis not present

## 2022-06-25 DIAGNOSIS — I340 Nonrheumatic mitral (valve) insufficiency: Secondary | ICD-10-CM | POA: Diagnosis not present

## 2022-06-25 DIAGNOSIS — Z6828 Body mass index (BMI) 28.0-28.9, adult: Secondary | ICD-10-CM | POA: Diagnosis not present

## 2022-06-30 DIAGNOSIS — M1711 Unilateral primary osteoarthritis, right knee: Secondary | ICD-10-CM | POA: Diagnosis not present

## 2022-07-03 DIAGNOSIS — M1711 Unilateral primary osteoarthritis, right knee: Secondary | ICD-10-CM | POA: Diagnosis not present

## 2022-07-03 DIAGNOSIS — G894 Chronic pain syndrome: Secondary | ICD-10-CM | POA: Diagnosis not present

## 2022-07-03 DIAGNOSIS — M1611 Unilateral primary osteoarthritis, right hip: Secondary | ICD-10-CM | POA: Diagnosis not present

## 2022-07-03 DIAGNOSIS — M47816 Spondylosis without myelopathy or radiculopathy, lumbar region: Secondary | ICD-10-CM | POA: Diagnosis not present

## 2022-07-15 DIAGNOSIS — M1711 Unilateral primary osteoarthritis, right knee: Secondary | ICD-10-CM | POA: Diagnosis not present

## 2022-07-21 DIAGNOSIS — M47816 Spondylosis without myelopathy or radiculopathy, lumbar region: Secondary | ICD-10-CM | POA: Diagnosis not present

## 2022-07-25 ENCOUNTER — Encounter: Payer: Self-pay | Admitting: Cardiology

## 2022-07-25 ENCOUNTER — Ambulatory Visit: Payer: Medicare PPO | Attending: Cardiology | Admitting: Cardiology

## 2022-07-25 VITALS — BP 118/72 | HR 64 | Ht 67.0 in | Wt 191.2 lb

## 2022-07-25 DIAGNOSIS — Z79899 Other long term (current) drug therapy: Secondary | ICD-10-CM

## 2022-07-25 DIAGNOSIS — I10 Essential (primary) hypertension: Secondary | ICD-10-CM | POA: Diagnosis not present

## 2022-07-25 DIAGNOSIS — D6869 Other thrombophilia: Secondary | ICD-10-CM | POA: Diagnosis not present

## 2022-07-25 DIAGNOSIS — I4819 Other persistent atrial fibrillation: Secondary | ICD-10-CM | POA: Diagnosis not present

## 2022-07-25 NOTE — Progress Notes (Signed)
Electrophysiology Office Note   Date:  07/25/2022   ID:  Garnet, [place] 11/30/1940, MRN [id number]  PCP:  Richardean Chimera, MD  Cardiologist: Purvis Sheffield Primary Electrophysiologist:  Rehana Uncapher Jorja Loa, MD    No chief complaint on file.     History of Present Illness: Hailey [name] is a 81 y.o. female who is being seen today for the evaluation of atrial fibrillation at the request of Richardean Chimera, MD. Presenting today for electrophysiology evaluation.    She has a history seen for atrial fibrillation.  She is post ablation x 2, most recently 05/11/2019.  She had more episodes of atrial fibrillation and was started on flecainide.  She had syncope due to bradycardia and her beta-blocker was stopped.  She is now on diltiazem.  Today, denies symptoms of palpitations, chest pain, shortness of breath, orthopnea, PND, lower extremity edema, claudication, dizziness, presyncope, syncope, bleeding, or neurologic sequela. The patient is tolerating medications without difficulties.  Since being seen she has done well.  She has had no chest pain or shortness of breath.  She is noted no evidence of atrial fibrillation.  She does have occasional nocturnal palpitations but these are all short-lived.  She is unfortunately been diagnosed with macular degeneration and has been having back pain which is being worked up.   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   Migraine headache    Palpitations    Restless leg syndrome    Vaginal atrophy    Past Surgical History:  Procedure Laterality Date   APPENDECTOMY     ATRIAL FIBRILLATION ABLATION N/A 08/20/2018   Procedure: ATRIAL FIBRILLATION ABLATION;  Surgeon: Regan Lemming, MD;  [facility]: [facility];  Service: Cardiovascular;  Laterality: N/A;   ATRIAL FIBRILLATION  ABLATION N/A 05/11/2019   Procedure: ATRIAL FIBRILLATION ABLATION;  Surgeon: Regan Lemming, MD;  Location: [facility] INVAS[facility] CV LAB;  Service: Cardiovascular;  Laterality: N/A;   BIOPSY  06/06/2020   Procedure: BIOPSY;  Surgeon: Corbin Ade, MD;  [facility];  Service: Endoscopy;;  gastric   CARDIAC CATHETERIZATION  08/05/2003   cone   CARDIOVERSION N/A 05/02/2019   Procedure: CARDIOVERSION;  Surgeon: Jodelle Red, MD;  Location: [facility] ENDOSCOPY;  Service: Cardiovascular;  Laterality: N/A;   COLONOSCOPY     ESOPHAGOGASTRODUODENOSCOPY N/A 06/06/2020   benign gastric polyps and normal esophagus status post dilation, normal duodenum   HERNIA REPAIR     LEFT HEART CATHETERIZATION WITH CORONARY ANGIOGRAM N/A 04/12/2012   Procedure: LEFT HEART CATHETERIZATION WITH CORONARY ANGIOGRAM;  Surgeon: Kathleene Hazel, MD;  Location: [facility] CATH LAB;  Service: Cardiovascular;  Laterality: N/A;   MALONEY DILATION N/A 06/06/2020   Procedure: [name] DILATION;  Surgeon: Corbin Ade, MD;  [facility];  Service: Endoscopy;  Laterality: N/A;   POLYPECTOMY  06/06/2020   Procedure: POLYPECTOMY;  Surgeon: Corbin Ade, MD;  [facility];  Service: Endoscopy;;  gastric   TOTAL ABDOMINAL HYSTERECTOMY       Current Outpatient Medications  Medication Sig Dispense Refill   acetaminophen (TYLENOL) 650 MG CR tablet Take 1,300 mg by mouth 2 (two) times daily.     apixaban (ELIQUIS) 5 MG TABS tablet Take 5 mg by mouth 2 (two) times daily.  Calcium Carb-Cholecalciferol (CALCIUM 600+D3 PO) Take 1 tablet by mouth daily.      calcium carbonate (TUMS - DOSED IN MG ELEMENTAL CALCIUM) 500 MG chewable tablet Chew 1 tablet by mouth as needed for indigestion or heartburn.     diclofenac Sodium (VOLTAREN) 1 % GEL Apply topically as directed.     diltiazem (CARDIZEM CD) 180 MG 24 hr capsule TAKE 1 CAPSULE BY MOUTH EVERY DAY 90 capsule 1   flecainide (TAMBOCOR) 50 MG  tablet TAKE 1 TABLET BY MOUTH TWICE A DAY 180 tablet 3   gabapentin (NEURONTIN) 100 MG capsule Take 100 mg by mouth at bedtime.     Misc Natural Products (TOTAL MEMORY & FOCUS FORMULA PO) Take by mouth.     Multiple Vitamin (MULTIVITAMIN WITH MINERALS) TABS tablet Take 1 tablet by mouth daily. Centrum Silver for Women     omeprazole (PRILOSEC) 20 MG capsule Take 20 mg by mouth 2 (two) times daily before a meal.     No current facility-administered medications for this visit.    Allergies:   Amiodarone, Dexilant [dexlansoprazole], Flomax [tamsulosin hcl], Nexium [esomeprazole], and Pantoprazole sodium   Social History:  The patient  reports that she has never smoked. She has never used smokeless tobacco. She reports that she does not currently use alcohol. She reports that she does not use drugs.   Family History:  The patient's family history includes Coronary artery disease in her father; Heart attack in her brother; Heart disease in her brother; Hypertension in her brother and mother; Other in her brother, sister, and sister; Stroke in her brother and brother; Transient ischemic attack in her brother.   ROS:  Please see the history of present illness.   Otherwise, review of systems is positive for none.   All other systems are reviewed and negative.   PHYSICAL EXAM: VS:  BP 118/72   Pulse 64   Ht 5\' 7"  (1.702 m)   Wt 191 lb 3.2 oz (86.7 kg)   SpO2 96%   BMI 29.95 kg/m  , BMI Body mass index is 29.95 kg/m. GEN: Well nourished, well developed, in no acute distress  HEENT: normal  Neck: no JVD, carotid bruits, or masses Cardiac: RRR; no murmurs, rubs, or gallops,no edema  Respiratory:  clear to auscultation bilaterally, normal work of breathing GI: soft, nontender, nondistended, + BS MS: no deformity or atrophy  Skin: warm and dry Neuro:  Strength and sensation are intact Psych: euthymic mood, full affect  EKG:  EKG is ordered today. Personal review of the ekg ordered shows sinus  rhythm, right bundle branch block  Recent Labs: No results found for requested labs within last 365 days.    Lipid Panel  No results found for: "CHOL", "TRIG", "HDL", "CHOLHDL", "VLDL", "LDLCALC", "LDLDIRECT"   Wt Readings from Last 3 Encounters:  07/25/22 191 lb 3.2 oz (86.7 kg)  04/15/22 192 lb 3.2 oz (87.2 kg)  12/26/21 194 lb (88 kg)      Other studies Reviewed: Additional studies/ records that were reviewed today include: TTE 05/20/2017 Review of the above records today demonstrates:  - Left ventricle: The cavity size was normal. Wall thickness was   normal. Systolic function was normal. The estimated ejection   fraction was in the range of 60% to 65%. Wall motion was normal;   there were no regional wall motion abnormalities. Left   ventricular diastolic function parameters were normal. - Aortic valve: Mildly to moderately calcified annulus. Trileaflet.   There was  mild regurgitation. - Mitral valve: Calcified annulus. Normal thickness leaflets .   There was moderate regurgitation. - Tricuspid valve: There was mild regurgitation. - Pulmonary arteries: PA peak pressure: 34 mm Hg (S).   ASSESSMENT AND PLAN:  1.  Persistent atrial fibrillation: Currently on flecainide 50 mg twice daily, diltiazem 180 mg daily, Eliquis 5 mg twice daily.  Status post ablation 06/21/2019 with repeat ablation 05/11/2019.  Remains in sinus rhythm.  CHA2DS2-VASc of at least 3.  No changes.  2.  Hypertension: Currently well-controlled  3.  Secondary hypercoagulable state: Currently on Eliquis for atrial fibrillation as above  4.  High risk medication monitoring: Currently on flecainide as above.  QRS remains narrow.   Current medicines are reviewed at length with the patient today.   The patient does not have concerns regarding her medicines.  The following changes were made today: None  Labs/ tests ordered today include:  Orders Placed This Encounter  Procedures   EKG 12-Lead       Disposition:   FU 6 months  Signed, Hanish Laraia Jorja Loa, MD  07/25/2022 11:09 AM     [facility] [address] [place] [address] [phone] (office) [phone] (fax)

## 2022-07-26 ENCOUNTER — Other Ambulatory Visit: Payer: Self-pay | Admitting: Cardiology

## 2022-07-31 DIAGNOSIS — N183 Chronic kidney disease, stage 3 unspecified: Secondary | ICD-10-CM | POA: Diagnosis not present

## 2022-07-31 DIAGNOSIS — I482 Chronic atrial fibrillation, unspecified: Secondary | ICD-10-CM | POA: Diagnosis not present

## 2022-07-31 DIAGNOSIS — F17210 Nicotine dependence, cigarettes, uncomplicated: Secondary | ICD-10-CM | POA: Diagnosis not present

## 2022-07-31 DIAGNOSIS — E7849 Other hyperlipidemia: Secondary | ICD-10-CM | POA: Diagnosis not present

## 2022-07-31 DIAGNOSIS — I340 Nonrheumatic mitral (valve) insufficiency: Secondary | ICD-10-CM | POA: Diagnosis not present

## 2022-07-31 DIAGNOSIS — Z0001 Encounter for general adult medical examination with abnormal findings: Secondary | ICD-10-CM | POA: Diagnosis not present

## 2022-07-31 DIAGNOSIS — I10 Essential (primary) hypertension: Secondary | ICD-10-CM | POA: Diagnosis not present

## 2022-08-15 DIAGNOSIS — M47816 Spondylosis without myelopathy or radiculopathy, lumbar region: Secondary | ICD-10-CM | POA: Diagnosis not present

## 2022-08-19 DIAGNOSIS — L814 Other melanin hyperpigmentation: Secondary | ICD-10-CM | POA: Diagnosis not present

## 2022-08-19 DIAGNOSIS — D225 Melanocytic nevi of trunk: Secondary | ICD-10-CM | POA: Diagnosis not present

## 2022-08-19 DIAGNOSIS — D692 Other nonthrombocytopenic purpura: Secondary | ICD-10-CM | POA: Diagnosis not present

## 2022-08-19 DIAGNOSIS — D044 Carcinoma in situ of skin of scalp and neck: Secondary | ICD-10-CM | POA: Diagnosis not present

## 2022-08-19 DIAGNOSIS — L570 Actinic keratosis: Secondary | ICD-10-CM | POA: Diagnosis not present

## 2022-08-19 DIAGNOSIS — Z85820 Personal history of malignant melanoma of skin: Secondary | ICD-10-CM | POA: Diagnosis not present

## 2022-08-19 DIAGNOSIS — Z85828 Personal history of other malignant neoplasm of skin: Secondary | ICD-10-CM | POA: Diagnosis not present

## 2022-08-19 DIAGNOSIS — D485 Neoplasm of uncertain behavior of skin: Secondary | ICD-10-CM | POA: Diagnosis not present

## 2022-08-19 DIAGNOSIS — I788 Other diseases of capillaries: Secondary | ICD-10-CM | POA: Diagnosis not present

## 2022-08-19 DIAGNOSIS — L821 Other seborrheic keratosis: Secondary | ICD-10-CM | POA: Diagnosis not present

## 2022-08-25 DIAGNOSIS — M47816 Spondylosis without myelopathy or radiculopathy, lumbar region: Secondary | ICD-10-CM | POA: Diagnosis not present

## 2022-09-01 DIAGNOSIS — L988 Other specified disorders of the skin and subcutaneous tissue: Secondary | ICD-10-CM | POA: Diagnosis not present

## 2022-09-01 DIAGNOSIS — D485 Neoplasm of uncertain behavior of skin: Secondary | ICD-10-CM | POA: Diagnosis not present

## 2022-09-01 DIAGNOSIS — Z85828 Personal history of other malignant neoplasm of skin: Secondary | ICD-10-CM | POA: Diagnosis not present

## 2022-09-04 ENCOUNTER — Encounter: Payer: Self-pay | Admitting: Internal Medicine

## 2022-09-05 DIAGNOSIS — J019 Acute sinusitis, unspecified: Secondary | ICD-10-CM | POA: Diagnosis not present

## 2022-09-05 DIAGNOSIS — Z6829 Body mass index (BMI) 29.0-29.9, adult: Secondary | ICD-10-CM | POA: Diagnosis not present

## 2022-09-05 DIAGNOSIS — N1832 Chronic kidney disease, stage 3b: Secondary | ICD-10-CM | POA: Diagnosis not present

## 2022-09-05 DIAGNOSIS — R030 Elevated blood-pressure reading, without diagnosis of hypertension: Secondary | ICD-10-CM | POA: Diagnosis not present

## 2022-09-05 DIAGNOSIS — I482 Chronic atrial fibrillation, unspecified: Secondary | ICD-10-CM | POA: Diagnosis not present

## 2022-09-23 DIAGNOSIS — M47816 Spondylosis without myelopathy or radiculopathy, lumbar region: Secondary | ICD-10-CM | POA: Diagnosis not present

## 2022-09-23 DIAGNOSIS — G894 Chronic pain syndrome: Secondary | ICD-10-CM | POA: Diagnosis not present

## 2022-09-23 DIAGNOSIS — M1711 Unilateral primary osteoarthritis, right knee: Secondary | ICD-10-CM | POA: Diagnosis not present

## 2022-09-23 DIAGNOSIS — M1611 Unilateral primary osteoarthritis, right hip: Secondary | ICD-10-CM | POA: Diagnosis not present

## 2022-09-26 DIAGNOSIS — M47816 Spondylosis without myelopathy or radiculopathy, lumbar region: Secondary | ICD-10-CM | POA: Diagnosis not present

## 2022-10-01 DIAGNOSIS — R5382 Chronic fatigue, unspecified: Secondary | ICD-10-CM | POA: Diagnosis not present

## 2022-10-01 DIAGNOSIS — N183 Chronic kidney disease, stage 3 unspecified: Secondary | ICD-10-CM | POA: Diagnosis not present

## 2022-10-01 DIAGNOSIS — K2100 Gastro-esophageal reflux disease with esophagitis, without bleeding: Secondary | ICD-10-CM | POA: Diagnosis not present

## 2022-10-01 DIAGNOSIS — E7849 Other hyperlipidemia: Secondary | ICD-10-CM | POA: Diagnosis not present

## 2022-10-01 DIAGNOSIS — R7303 Prediabetes: Secondary | ICD-10-CM | POA: Diagnosis not present

## 2022-10-08 DIAGNOSIS — E7849 Other hyperlipidemia: Secondary | ICD-10-CM | POA: Diagnosis not present

## 2022-10-08 DIAGNOSIS — N1832 Chronic kidney disease, stage 3b: Secondary | ICD-10-CM | POA: Diagnosis not present

## 2022-10-08 DIAGNOSIS — Z6829 Body mass index (BMI) 29.0-29.9, adult: Secondary | ICD-10-CM | POA: Diagnosis not present

## 2022-10-08 DIAGNOSIS — I340 Nonrheumatic mitral (valve) insufficiency: Secondary | ICD-10-CM | POA: Diagnosis not present

## 2022-10-08 DIAGNOSIS — I10 Essential (primary) hypertension: Secondary | ICD-10-CM | POA: Diagnosis not present

## 2022-10-08 DIAGNOSIS — J309 Allergic rhinitis, unspecified: Secondary | ICD-10-CM | POA: Diagnosis not present

## 2022-10-08 DIAGNOSIS — G2581 Restless legs syndrome: Secondary | ICD-10-CM | POA: Diagnosis not present

## 2022-10-08 DIAGNOSIS — I482 Chronic atrial fibrillation, unspecified: Secondary | ICD-10-CM | POA: Diagnosis not present

## 2022-10-08 DIAGNOSIS — E042 Nontoxic multinodular goiter: Secondary | ICD-10-CM | POA: Diagnosis not present

## 2022-10-13 DIAGNOSIS — E042 Nontoxic multinodular goiter: Secondary | ICD-10-CM | POA: Diagnosis not present

## 2022-10-21 DIAGNOSIS — M47816 Spondylosis without myelopathy or radiculopathy, lumbar region: Secondary | ICD-10-CM | POA: Diagnosis not present

## 2022-10-22 NOTE — Progress Notes (Unsigned)
GI Office Note    Referring Provider: Caryl Bis, MD Primary Care Physician:  Caryl Bis, MD Primary Gastroenterologist: Cristopher Estimable.Rourk, MD  Date:  10/23/2022  ID:  STPEHANIE ANDERT, DOB Dec 29, 1940, MRN [id number]   Chief Complaint   Chief Complaint  Patient presents with   Gastroesophageal Reflux    Currently takes omeprazole 20 mg bid and tums   History of Present Illness  Khyli EBONE DELAGUILA is a 82 y.o. female with a history of GERD, dysphagia, A-fib on Eliquis, HTN, HLD, Mnire's disease, restless leg presenting today with ongoing complaints of GERD.  Last EGD November 2021 with benign gastric polyps and normal esophagus s/p dilation.   Seen in the office 10/29/2021.  Overall she is doing well overall, denied any nausea/vomiting/esophageal burning, coughing, abdominal pain, melena, hematochezia.  Reported her symptoms were controlled on omeprazole 20 mg twice daily and was not interested in changing her medication at that time.  It is noted that she has had previous failure of many other PPIs due to side effects including Dexilant, Nexium, and pantoprazole.  Pepcid and Carafate not previously helpful either.  She did note some intermittent solid food dysphagia but does not feel the need to undergo any stretching.  Antireflux lifestyle modifications reinforced.  Dysphagia precautions also discussed.  Last office visit 04/15/2022. Ongoing c/o chest burning at night and stretching feeling that improves with rubbing or moving it around. Intermittent issues with swallowing. Trouble with rice. Eating fries is hard to swallow and at times causes pain. Requested to not change medication. Reported issues with increasing omeprazole in the past. No recent need for zofran. Tongue pain on right side of mouth. BPE ordered. Advised Tums as needed.   BPE 04/22/2022: -Esophageal dysmotility -Significant reflux  Today: Late day christmas she began having a dry hacking cough without fever. Next  morning her cough was gone but states she was wheezing and was COVID negative. Wheezing lasted about 24 hours. Has been having some frequent throat clearing. Late January/early February she had a doctors appointment her PCP told her she had acute sinusitis and was given flonase and 6 day supply of prednisone, and an abx. Started than on 2/10 and then a couple days later she had some mid epigastric pain and she continued the full prescriptions but feels as though her reflux has been worse since then and still has some congestion and feels that her voice is raspy. Started taking Tums for breakthrough but has pain epigastric and comes up her throat- like a burning. Tums and omeprazole is not taking care of the pain. This morning her pain was a little better and reduced her daily vitamin to once per day and briefly stopped but recently restarted.    Current Outpatient Medications  Medication Sig Dispense Refill   acetaminophen (TYLENOL) 650 MG CR tablet Take 1,300 mg by mouth 2 (two) times daily.     apixaban (ELIQUIS) 5 MG TABS tablet Take 5 mg by mouth 2 (two) times daily.     Calcium Carb-Cholecalciferol (CALCIUM 600+D3 PO) Take 1 tablet by mouth daily.      calcium carbonate (TUMS - DOSED IN MG ELEMENTAL CALCIUM) 500 MG chewable tablet Chew 1 tablet by mouth as needed for indigestion or heartburn.     diclofenac Sodium (VOLTAREN) 1 % GEL Apply topically as directed.     diltiazem (CARDIZEM CD) 180 MG 24 hr capsule TAKE 1 CAPSULE BY MOUTH EVERY DAY 90 capsule 1   flecainide (TAMBOCOR)  50 MG tablet TAKE 1 TABLET BY MOUTH TWICE A DAY 180 tablet 3   fluticasone (FLONASE) 50 MCG/ACT nasal spray Place 2 sprays into both nostrils daily.     gabapentin (NEURONTIN) 100 MG capsule Take 100 mg by mouth at bedtime.     omeprazole (PRILOSEC) 20 MG capsule Take 20 mg by mouth 2 (two) times daily before a meal.     Misc Natural Products (TOTAL MEMORY & FOCUS FORMULA PO) Take by mouth. (Patient not taking: Reported  on 10/23/2022)     No current facility-administered medications for this visit.    Past Medical History:  Diagnosis Date   Atrial fibrillation ([place])    Breast nodule 11/16/2013   H/O hematuria    HLD (hyperlipidemia)    Hypertension    Meniere's disease    ringing in the ears   Migraine headache    Palpitations    Restless leg syndrome    Vaginal atrophy     Past Surgical History:  Procedure Laterality Date   APPENDECTOMY     ATRIAL FIBRILLATION ABLATION N/A 08/20/2018   Procedure: ATRIAL FIBRILLATION ABLATION;  Surgeon: Constance Haw, MD;  Location: [facility];  Service: Cardiovascular;  Laterality: N/A;   ATRIAL FIBRILLATION ABLATION N/A 05/11/2019   Procedure: ATRIAL FIBRILLATION ABLATION;  Surgeon: Constance Haw, MD;  Location: [facility];  Service: Cardiovascular;  Laterality: N/A;   BIOPSY  06/06/2020   Procedure: BIOPSY;  Surgeon: Daneil Dolin, MD;  [facility];  Service: Endoscopy;;  gastric   CARDIAC CATHETERIZATION  08/05/2003   cone   CARDIOVERSION N/A 05/02/2019   Procedure: CARDIOVERSION;  Surgeon: Buford Dresser, MD;  Location: [facility] ENDOSCOPY;  Service: Cardiovascular;  Laterality: N/A;   COLONOSCOPY     ESOPHAGOGASTRODUODENOSCOPY N/A 06/06/2020   benign gastric polyps and normal esophagus status post dilation, normal duodenum   HERNIA REPAIR     LEFT HEART CATHETERIZATION WITH CORONARY ANGIOGRAM N/A 04/12/2012   Procedure: LEFT HEART CATHETERIZATION WITH CORONARY ANGIOGRAM;  Surgeon: Burnell Blanks, MD;  Location[facility] CATH LAB;  Service: Cardiovascular;  Laterality: N/A;   MALONEY DILATION N/A 06/06/2020   Procedure: Venia Minks DILATION;  Surgeon: Daneil Dolin, MD;  [facility];  Service: Endoscopy;  Laterality: N/A;   POLYPECTOMY  06/06/2020   Procedure: POLYPECTOMY;  Surgeon: Daneil Dolin, MD;  [facility];  Service: Endoscopy;;  gastric   TOTAL ABDOMINAL HYSTERECTOMY       Family History  Problem Relation Age of Onset   Coronary artery disease Father        mid 76s   Hypertension Mother    Stroke Brother    Transient ischemic attack Brother    Other Brother        on oxygen   Hypertension Brother    Stroke Brother    Heart disease Brother    Heart attack Brother    Other Sister        a fib, restless leg   Other Sister        pressure in eyes   Colon cancer Neg Hx    Gastric cancer Neg Hx    Esophageal cancer Neg Hx     Allergies as of 10/23/2022 - Review Complete 10/23/2022  Allergen Reaction Noted   Amiodarone Itching 04/24/2018   Dexilant [dexlansoprazole] Diarrhea and Other (See Comments) 07/17/2020   Flomax [tamsulosin hcl] Nausea And Vomiting 01/24/2015   Nexium [esomeprazole] Other (See Comments)  01/02/2021   Pantoprazole sodium Other (See Comments) 07/17/2020    Social History   Socioeconomic History   Marital status: Single    Spouse name: Not on file   Number of children: 0   Years of education: College   Highest education level: Not on file  Occupational History   Occupation: retired  Tobacco Use   Smoking status: Never   Smokeless tobacco: Never  Vaping Use   Vaping Use: Never used  Substance and Sexual Activity   Alcohol use: Not Currently    Comment: rarely; glass of wine with dinner   Drug use: No   Sexual activity: Not Currently    Birth control/protection: Surgical    Comment: hyst  Other Topics Concern   Not on file  Social History Narrative   Single. Retired.    Patient lives at home alone.   Caffeine Use: none   Social Determinants of Radio broadcast assistant Strain: Not on file  Food Insecurity: Not on file  Transportation Needs: Not on file  Physical Activity: Not on file  Stress: Not on file  Social Connections: Not on file     Review of Systems   Gen: Denies fever, chills, anorexia. Denies fatigue, weakness, weight loss.  CV: Denies chest pain, palpitations, syncope, peripheral  edema, and claudication. Resp: Denies dyspnea at rest, cough, wheezing, coughing up blood, and pleurisy. GI: See HPI Derm: Denies rash, itching, dry skin Psych: Denies depression, anxiety, memory loss, confusion. No homicidal or suicidal ideation.  Heme: Denies bruising, bleeding, and enlarged lymph nodes.   Physical Exam   BP (!) 151/78 (BP Location: Right Arm, Patient Position: Sitting, Cuff Size: Large)   Pulse 67   Temp (!) 97.3 F (36.3 C) (Oral)   Ht 5\' 7"  (1.702 m)   Wt 195 lb 12.8 oz (88.8 kg)   SpO2 99%   BMI 30.67 kg/m   General:   Alert and oriented. No distress noted. Pleasant and cooperative.  Head:  Normocephalic and atraumatic. Eyes:  Conjuctiva clear without scleral icterus. Mouth:  Oral mucosa pink and moist. Good dentition. No lesions. Lungs:  Clear to auscultation bilaterally. No wheezes, rales, or rhonchi. No distress.  Heart:  S1, S2 present without murmurs appreciated.  Abdomen:  +BS, soft, non-tender and non-distended. No rebound or guarding. No HSM or masses noted. Rectal: deferred Msk:  Symmetrical without gross deformities. Normal posture. Extremities:  Without edema. Neurologic:  Alert and  oriented x4 Psych:  Alert and cooperative. Normal mood and affect.   Assessment  Reizel DRISHTI SYBESMA is a 82 y.o. female with a history of GERD, dysphagia, A-fib on Eliquis, HTN, HLD, Mnire's disease, restless leg presenting today with ongoing complaints of GERD.  GERD: Worsening GERD for the last 3 months. Likely exacerbated by oral prednisone that she was given in January. Has had epigastric pain and raspy voice and a burning into her esophagus. Has tried and failed many PPIs previously and was intolerant to omeprazole 40 mg BID. Prior to Christmas and starting her prednisone she had fairly well controlled symptoms. Has been taking Tums for breakthrough without significant relief. Discussed trial of Voquezna with patient and given her past she is weary of switching  medications therefore she would like to try a custom dosage regimen with one dose being higher than the other. We will trial omeprazole 20 mg in the morning and 40 mg in the afternoon.  Advised GERD diet and provided TIF handout to determine if she would  like more permanent treatment and avoid PPIs.   Dysphagia: Currently without any overt dysphagia. Tends to have more difficulty with items like french fries. BPE in September 2023 noting esophageal dysmotility and significant reflux. Dysphagia precautions reviewed.   PLAN   Will trial omeprazole 20 mg in the morning and 40 mg 30 minutes prior to dinner. GERD diet/lifestyle modifications Famotidine as needed for breakthrough.  Dysphagia precautions.  TIF pamphlet provided. Follow up in 6-8 weeks.     Venetia Night, MSN, FNP-BC, AGACNP-BC [facility]

## 2022-10-23 ENCOUNTER — Ambulatory Visit: Payer: Medicare PPO | Admitting: Gastroenterology

## 2022-10-23 ENCOUNTER — Encounter: Payer: Self-pay | Admitting: Gastroenterology

## 2022-10-23 VITALS — BP 141/71 | HR 66 | Temp 97.3°F | Ht 67.0 in | Wt 195.8 lb

## 2022-10-23 DIAGNOSIS — R1319 Other dysphagia: Secondary | ICD-10-CM

## 2022-10-23 DIAGNOSIS — K219 Gastro-esophageal reflux disease without esophagitis: Secondary | ICD-10-CM

## 2022-10-23 DIAGNOSIS — R1013 Epigastric pain: Secondary | ICD-10-CM

## 2022-10-23 NOTE — Patient Instructions (Addendum)
Start omeprazole 20 mg in the morning and 40 mg at dinnertime.  I know you have tried famotidine in the past however you could take this at bedtime as needed as well for any breakthrough.  Please call me with a progress report in 2-3 weeks.  As we discussed we could try Voquezna which is a new medication for acid reflux if this change in omeprazole is not helpful.  Follow a GERD diet:  Avoid fried, fatty, greasy, spicy, citrus foods. Avoid caffeine and carbonated beverages. Avoid chocolate. Try eating 4-6 small meals a day rather than 3 large meals. Do not eat within 3 hours of laying down. Prop head of bed up on wood or bricks to create a 6 inch incline.   I am providing you with a handout about TIF procedure.  This is for long-term management of reflux to be able to come off of PPI.  I am not sure if there is an age restriction but if you are interested I can send her information to Dr. Jenetta Downer who performs this procedure and he would be happy to give you a telephone call to discuss this further.   Plan to follow-up in 6 as 8 weeks, sooner if needed.  It was a pleasure to see you today. I want to create trusting relationships with patients. If you receive a survey regarding your visit,  I greatly appreciate you taking time to fill this out on paper or through your MyChart. I value your feedback.  Venetia Night, MSN, FNP-BC, AGACNP-BC [facility]

## 2022-11-06 ENCOUNTER — Telehealth: Payer: Self-pay | Admitting: *Deleted

## 2022-11-06 NOTE — Telephone Encounter (Signed)
Spoke to pt, informed her that I would leave some samples of Voquenza at front desk. Pt voiced understanding. Pt wants to know if she is to stop taking Omeprazole and will she go through withdrawals

## 2022-11-06 NOTE — Telephone Encounter (Signed)
Pt called and states omeprazole is making her sick. She states she has nausea and is dizzy. She states she wakes up with a headache in the morning. Wants to know if she can try Voquezna and will it give her the same side effects.

## 2022-11-07 NOTE — Telephone Encounter (Signed)
Pt came and picked up samples of Voquezna. Informed her of recommendations. Pt voiced understanding.

## 2022-11-14 DIAGNOSIS — H3530 Unspecified macular degeneration: Secondary | ICD-10-CM | POA: Diagnosis not present

## 2022-11-14 DIAGNOSIS — H353131 Nonexudative age-related macular degeneration, bilateral, early dry stage: Secondary | ICD-10-CM | POA: Diagnosis not present

## 2022-11-14 DIAGNOSIS — H5203 Hypermetropia, bilateral: Secondary | ICD-10-CM | POA: Diagnosis not present

## 2022-11-14 DIAGNOSIS — H52223 Regular astigmatism, bilateral: Secondary | ICD-10-CM | POA: Diagnosis not present

## 2022-11-14 DIAGNOSIS — H524 Presbyopia: Secondary | ICD-10-CM | POA: Diagnosis not present

## 2022-11-18 ENCOUNTER — Other Ambulatory Visit: Payer: Self-pay | Admitting: Gastroenterology

## 2022-11-18 ENCOUNTER — Telehealth: Payer: Self-pay | Admitting: *Deleted

## 2022-11-18 MED ORDER — SUCRALFATE 1 GM/10ML PO SUSP: 1.0000 g | Freq: Four times a day (QID) | ORAL | 1 refills | Status: DC

## 2022-11-18 NOTE — Telephone Encounter (Signed)
Pt called and states that she does not see any difference in taking Omeprazole and Voquezna. She states she is still having heart burn and nausea. She still wakes up in the morning with burning in her chest and having to cough.

## 2022-11-18 NOTE — Telephone Encounter (Signed)
Spoke to pt, informed her of recommendations. Pt voiced understanding. Informed to call in 2 week with an update.

## 2022-11-24 DIAGNOSIS — H903 Sensorineural hearing loss, bilateral: Secondary | ICD-10-CM | POA: Diagnosis not present

## 2022-11-28 DIAGNOSIS — L304 Erythema intertrigo: Secondary | ICD-10-CM | POA: Diagnosis not present

## 2022-11-28 DIAGNOSIS — Z6829 Body mass index (BMI) 29.0-29.9, adult: Secondary | ICD-10-CM | POA: Diagnosis not present

## 2022-11-28 DIAGNOSIS — R030 Elevated blood-pressure reading, without diagnosis of hypertension: Secondary | ICD-10-CM | POA: Diagnosis not present

## 2022-12-05 ENCOUNTER — Other Ambulatory Visit: Payer: Self-pay | Admitting: Gastroenterology

## 2022-12-09 ENCOUNTER — Ambulatory Visit: Payer: Medicare PPO | Admitting: Internal Medicine

## 2022-12-09 ENCOUNTER — Encounter: Payer: Self-pay | Admitting: Internal Medicine

## 2022-12-09 VITALS — BP 151/78 | HR 65 | Temp 97.4°F | Ht 67.0 in | Wt 195.2 lb

## 2022-12-09 DIAGNOSIS — R0789 Other chest pain: Secondary | ICD-10-CM | POA: Diagnosis not present

## 2022-12-09 DIAGNOSIS — K219 Gastro-esophageal reflux disease without esophagitis: Secondary | ICD-10-CM | POA: Diagnosis not present

## 2022-12-09 DIAGNOSIS — R1319 Other dysphagia: Secondary | ICD-10-CM

## 2022-12-09 NOTE — Patient Instructions (Addendum)
It was nice to see you again today!  As discussed, you have a challenging problem.  You have failed all the traditional treatments for acid reflux.  That does not mean you do not have acid reflux, it means symptoms have not responded.  Keep appointment with Dr. Gershon Crane month.  Heart needs to be checked out.  Appointment here in July to make plans for further evaluation.  For now continue omeprazole 20 mg twice daily  Stop Carafate  Hopefully, your heart will check out okay.  Then we need to move on and to determine to what extent, if any, acid reflux is responsible for your symptoms.  This entails a manometry pH/impedance study off acid suppression therapy for 2 weeks.  This procedure will need to be done in [place].  Blood pressure elevated today-she will be rechecked.  Will see you in July and go from there.

## 2022-12-09 NOTE — Progress Notes (Signed)
Primary Care Physician:  Richardean Chimera, MD Primary Gastroenterologist:  Dr.   Pre-Procedure History & Physical: HPI:  Hailey [name] is a 82 y.o. female here for for follow-up of chronic longstanding GERD, some regurgitation, atypical chest pain.  She notes more epigastric pain with a recent course of prednisone. On long-term omeprazole for 30+ years.  Morning dose of omeprazole increased to 40 mg recently.  She developed leg "swelling" and stopped this regimen.  Back to 20 mg twice daily.  She has tried multiple other agents and has had various side effects (Dexilant, Protonix, Nexium).  Did not like Carafate.  Stopped taking it.  Felt it did not help.  Variable response to therapy; never great resolution of reflux symptoms.  No significant dysphagia recently.  BPE demonstrated esophageal dysmotility;  some reflux was demonstrated the study..  No obstruction to passage of the barium tablet.  Interestingly, no hiatal hernia noted on this study.  Prior EGD demonstrated no evidence of esophagitis.  Gallbladder long gone.   Tums help nocturnal symptoms.  No improvement recently with a course of Carafate.  Most recently, no improvement with Voquezna. She is seeing Dr. Elberta Fortis next month. She reports, in retrospect, she has  never really been rid of the symptoms over the last several years.   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   Migraine headache    Palpitations    Restless leg syndrome    Vaginal atrophy     Past Surgical History:  Procedure Laterality Date   APPENDECTOMY     ATRIAL FIBRILLATION ABLATION N/A 08/20/2018   Procedure: ATRIAL FIBRILLATION ABLATION;  Surgeon: Regan Lemming, MD;  Location: [facility];  Service: Cardiovascular;  Laterality: N/A;   ATRIAL FIBRILLATION ABLATION N/A 05/11/2019    Procedure: ATRIAL FIBRILLATION ABLATION;  Surgeon: Regan Lemming, MD;  Location: [facility];  Service: Cardiovascular;  Laterality: N/A;   BIOPSY  06/06/2020   Procedure: BIOPSY;  Surgeon: Corbin Ade, MD;  [facility];  Service: Endoscopy;;  gastric   CARDIAC CATHETERIZATION  08/05/2003   cone   CARDIOVERSION N/A 05/02/2019   Procedure: CARDIOVERSION;  Surgeon: Jodelle Red, MD;  Location: [facility] ENDOSCOPY;  Service: Cardiovascular;  Laterality: N/A;   COLONOSCOPY     ESOPHAGOGASTRODUODENOSCOPY N/A 06/06/2020   benign gastric polyps and normal esophagus status post dilation, normal duodenum   HERNIA REPAIR     LEFT HEART CATHETERIZATION WITH CORONARY ANGIOGRAM N/A 04/12/2012   Procedure: LEFT HEART CATHETERIZATION WITH CORONARY ANGIOGRAM;  Surgeon: Kathleene Hazel, MD;  Location: [facility] CATH LAB;  Service: Cardiovascular;  Laterality: N/A;   MALONEY DILATION N/A 06/06/2020   Procedure: [name] DILATION;  Surgeon: Corbin Ade, MD;  [facility];  Service: Endoscopy;  Laterality: N/A;   POLYPECTOMY  06/06/2020   Procedure: POLYPECTOMY;  Surgeon: Corbin Ade, MD;  [facility];  Service: Endoscopy;;  gastric   TOTAL ABDOMINAL HYSTERECTOMY      Prior to Admission medications   Medication Sig Start Date End Date Taking? Authorizing Provider  acetaminophen (TYLENOL) 650 MG CR tablet Take 1,300 mg by mouth 2 (two) times daily.   Yes [provider]  apixaban (ELIQUIS) 5 MG TABS tablet Take 5 mg by  mouth 2 (two) times daily.   Yes [provider]  Calcium Carb-Cholecalciferol (CALCIUM 600+D3 PO) Take 1 tablet by mouth daily.    Yes [provider]  calcium carbonate (TUMS - DOSED IN MG ELEMENTAL CALCIUM) 500 MG chewable tablet Chew 1 tablet by mouth as needed for indigestion or heartburn.   Yes [provider]  diclofenac Sodium (VOLTAREN) 1 % GEL Apply topically as directed.   Yes [provider]  diltiazem (CARDIZEM CD) 180 MG 24 hr capsule TAKE 1 CAPSULE BY MOUTH EVERY DAY 07/29/22  Yes Camnitz, Will Daphine Deutscher, MD  flecainide (TAMBOCOR) 50 MG tablet TAKE 1 TABLET BY MOUTH TWICE A DAY 02/05/22  Yes Camnitz, Will Daphine Deutscher, MD  fluticasone (FLONASE) 50 MCG/ACT nasal spray Place 2 sprays into both nostrils daily. 09/05/22  Yes [provider]  gabapentin (NEURONTIN) 100 MG capsule Take 100 mg by mouth at bedtime.   Yes [provider]  nystatin cream (MYCOSTATIN) Apply 1 Application topically 3 (three) times daily. 11/28/22  Yes [provider]  omeprazole (PRILOSEC) 20 MG capsule Take 20 mg by mouth 2 (two) times daily before a meal.   Yes [provider]  OVER THE COUNTER MEDICATION Take 1 capsule by mouth daily. Focus Select Areds2 for macular degeneration   Yes [provider]  sucralfate (CARAFATE) 1 GM/10ML suspension TAKE 10 MLS (1 G TOTAL) BY MOUTH 4 (FOUR) TIMES DAILY FOR 21 DAYS. Patient not taking: Reported on 12/09/2022 12/05/22 12/26/22  Aida Raider, NP    Allergies as of 12/09/2022 - Review Complete 12/09/2022  Allergen Reaction Noted   Amiodarone Itching 04/24/2018   Dexilant [dexlansoprazole] Diarrhea and Other (See Comments) 07/17/2020   Flomax [tamsulosin hcl] Nausea And Vomiting 01/24/2015   Nexium [esomeprazole] Other (See Comments) 01/02/2021   Pantoprazole sodium Other (See Comments) 07/17/2020    Family History  Problem Relation Age of Onset   Coronary artery disease Father        mid 51s   Hypertension Mother    Stroke Brother    Transient ischemic attack Brother    Other Brother        on oxygen   Hypertension Brother    Stroke Brother    Heart disease Brother    Heart attack Brother    Other Sister        a fib, restless leg   Other Sister        pressure in eyes   Colon cancer Neg Hx    Gastric cancer Neg Hx    Esophageal cancer Neg Hx     Social History   Socioeconomic History   Marital  status: Single    Spouse name: Not on file   Number of children: 0   Years of education: College   Highest education level: Not on file  Occupational History   Occupation: retired  Tobacco Use   Smoking status: Never   Smokeless tobacco: Never  Vaping Use   Vaping Use: Never used  Substance and Sexual Activity   Alcohol use: Not Currently    Comment: rarely; glass of wine with dinner   Drug use: No   Sexual activity: Not Currently    Birth control/protection: Surgical    Comment: hyst  Other Topics Concern   Not on file  Social History Narrative   Single. Retired.    Patient lives at home alone.   Caffeine Use: none   Social Determinants of Health   Financial Resource Strain:  Not on file  Food Insecurity: Not on file  Transportation Needs: Not on file  Physical Activity: Not on file  Stress: Not on file  Social Connections: Not on file  Intimate Partner Violence: Not on file    Review of Systems: See HPI, otherwise negative ROS  Physical Exam: BP (!) 156/83 (BP Location: Right Arm, Patient Position: Sitting, Cuff Size: Large)   Pulse 74   Temp (!) 97.4 F (36.3 C) (Oral)   Ht 5\' 7"  (1.702 m)   Wt 195 lb 3.2 oz (88.5 kg)   SpO2 97%   BMI 30.57 kg/m  General:   Alert,  Well-developed, well-nourished, pleasant and cooperative in NAD Heart:  Regular rate and rhythm; no murmurs, clicks, rubs,  or gallops. Abdomen: Non-distended, normal bowel sounds.  Soft and nontender without appreciable mass or hepatosplenomegaly.   Impression/Plan: 82 year old lady with longstanding symptoms consistent with reflux and atypical chest pain who does have a history of coronary disease.  She has historically been seen  regularly by her cardiologist.  This nice lady has failed to appreciate any meaningful improvement in her GI symptoms throughout the duration of her illness with a variety of PPIs.  Her reported intolerances to these agents is interesting.  Although it is known that about  7% of the general population will not respond to a PPI, it is notable this lady did not derive any improvement with a recent course of p-cab therapy. This really brings in the question to what degree, if any, she has symptomatic gastroesophageal reflux.  At this point, we need to consider the possibility of nonacidic reflux, functional heartburn or even an underlying esophageal motility disorder producing "pseudo reflux".  Although some reflux was noted on her barium study this is not at all definitive. She  will likely need further definitive studies (i.e. pH/imedance/manometry) in the near future.  Recommendations:  For now continue omeprazole 20 mg twice daily  Stop Carafate  See Dr. Elberta Fortis to make sure there are not any unaddressed cardiac issues.  After that has been done, we will move forward with an ambulatory pH/impedance/manometry study OFF acid suppression therapy to further delineate the cause of her GI symptoms.  I explained the reasoning behind having these procedures and described how these procedures are carried out.  I suspect Hailey [name] will have a difficult time with them.    Further recommendations to follow.     Notice: This dictation was prepared with Dragon dictation along with smaller phrase technology. Any transcriptional errors that result from this process are unintentional and may not be corrected upon review.

## 2022-12-17 ENCOUNTER — Encounter (HOSPITAL_COMMUNITY): Payer: Self-pay | Admitting: Emergency Medicine

## 2022-12-17 ENCOUNTER — Other Ambulatory Visit: Payer: Self-pay

## 2022-12-17 ENCOUNTER — Emergency Department (HOSPITAL_COMMUNITY)
Admission: EM | Admit: 2022-12-17 | Discharge: 2022-12-18 | Disposition: A | Discharge status: Home / Self Care | Payer: Medicare PPO | Attending: Emergency Medicine | Admitting: Emergency Medicine

## 2022-12-17 ENCOUNTER — Emergency Department (HOSPITAL_COMMUNITY): Payer: Medicare PPO

## 2022-12-17 DIAGNOSIS — R0602 Shortness of breath: Secondary | ICD-10-CM | POA: Insufficient documentation

## 2022-12-17 DIAGNOSIS — Z79899 Other long term (current) drug therapy: Secondary | ICD-10-CM | POA: Insufficient documentation

## 2022-12-17 DIAGNOSIS — R079 Chest pain, unspecified: Secondary | ICD-10-CM | POA: Diagnosis not present

## 2022-12-17 DIAGNOSIS — Z7901 Long term (current) use of anticoagulants: Secondary | ICD-10-CM | POA: Insufficient documentation

## 2022-12-17 DIAGNOSIS — I4891 Unspecified atrial fibrillation: Secondary | ICD-10-CM | POA: Diagnosis not present

## 2022-12-17 DIAGNOSIS — I10 Essential (primary) hypertension: Secondary | ICD-10-CM | POA: Diagnosis not present

## 2022-12-17 LAB — BASIC METABOLIC PANEL
Anion gap: 9 (ref 5–15)
BUN: 24 mg/dL — ABNORMAL HIGH (ref 8–23)
CO2: 23 mmol/L (ref 22–32)
Calcium: 9.3 mg/dL (ref 8.9–10.3)
Chloride: 105 mmol/L (ref 98–111)
Creatinine, Ser: 1.27 mg/dL — ABNORMAL HIGH (ref 0.44–1.00)
GFR, Estimated: 42 mL/min — ABNORMAL LOW (ref >60)
Glucose, Bld: 189 mg/dL — ABNORMAL HIGH (ref 70–99)
Potassium: 3.6 mmol/L (ref 3.5–5.1)
Sodium: 137 mmol/L (ref 135–145)

## 2022-12-17 LAB — CBC
HCT: 39.2 % (ref 36.0–46.0)
Hemoglobin: 12.8 g/dL (ref 12.0–15.0)
MCH: 33.2 pg (ref 26.0–34.0)
MCHC: 32.7 g/dL (ref 30.0–36.0)
MCV: 101.8 fL — ABNORMAL HIGH (ref 80.0–100.0)
Platelets: 255 10*3/uL (ref 150–400)
RBC: 3.85 MIL/uL — ABNORMAL LOW (ref 3.87–5.11)
RDW: 13.4 % (ref 11.5–15.5)
WBC: 5.6 10*3/uL (ref 4.0–10.5)
nRBC: 0 % (ref 0.0–0.2)

## 2022-12-17 LAB — TROPONIN I (HIGH SENSITIVITY): Troponin I (High Sensitivity): 8 ng/L (ref <18)

## 2022-12-17 NOTE — ED Triage Notes (Signed)
Pt c/o cp and sob since Christmas/5 months with ankle swelling. Both intermittent. Sob more with activity. Pt a/o. Ambulatory. Color wnl. Pt in no resp distress or obvious sob noted at this time. Pt states she is having "a little bit" of chest pain at present and points from navel up to mid sternum area. States has been having issues is acid reflux and reflux meds has been changing. No acute symptoms. Pt states came today because she is just tired of it.

## 2022-12-18 ENCOUNTER — Ambulatory Visit: Payer: Medicare PPO | Admitting: Gastroenterology

## 2022-12-18 NOTE — ED Provider Notes (Signed)
[facility] AT [facility] Provider Note   CSN: [id number] Arrival date & time: 12/17/22  2048     History  Chief Complaint  Patient presents with   Shortness of Breath   Chest Pain    Hailey [name] is a 82 y.o. female.  The history is provided by the patient.  Patient with history of A-fib, hypertension presents with multiple complaints.  Patient reports around Christmas time she began having cough and wheezing.  She is also been having lower extremity swelling since that time.  She will have brief intermittent episodes of chest pain.  She is also episodes of esophageal reflux.  She has been seen by gastroenterology for this.  She is also been treated for sinusitis as well with antibiotics. Tonight she presents because she is looking for answers for her chronic shortness of breath in the past several months No acute changes tonight.    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 Authorizing Provider  acetaminophen (TYLENOL) 650 MG CR tablet Take 1,300 mg by mouth 2 (two) times daily.    [provider]  apixaban (ELIQUIS) 5 MG TABS tablet Take 5 mg by mouth 2 (two) times daily.    [provider]  Calcium Carb-Cholecalciferol (CALCIUM 600+D3 PO) Take 1 tablet by mouth daily.     [provider]  calcium carbonate (TUMS - DOSED IN MG ELEMENTAL CALCIUM) 500 MG chewable tablet Chew 1 tablet by mouth as needed for indigestion or heartburn.    [provider]  diclofenac Sodium (VOLTAREN) 1 % GEL Apply topically as directed.    [provider]  diltiazem (CARDIZEM CD) 180 MG 24  hr capsule TAKE 1 CAPSULE BY MOUTH EVERY DAY 07/29/22   Camnitz, Andree Coss, MD  flecainide (TAMBOCOR) 50 MG tablet TAKE 1 TABLET BY MOUTH TWICE A DAY 02/05/22   Camnitz, Will Daphine Deutscher, MD  fluticasone (FLONASE) 50 MCG/ACT nasal spray Place 2 sprays into both nostrils daily. 09/05/22   [provider]  gabapentin (NEURONTIN) 100 MG capsule Take 100 mg by mouth at bedtime.    [provider]  nystatin cream (MYCOSTATIN) Apply 1 Application topically 3 (three) times daily. 11/28/22   [provider]  omeprazole (PRILOSEC) 20 MG capsule Take 20 mg by mouth 2 (two) times daily before a meal.    [provider]  OVER THE COUNTER MEDICATION Take 1 capsule by mouth daily. Focus Select Areds2 for macular degeneration    [provider]  sucralfate (CARAFATE) 1 GM/10ML suspension TAKE 10 MLS (1 G TOTAL) BY MOUTH 4 (FOUR) TIMES DAILY FOR 21 DAYS. Patient not taking: Reported on 12/09/2022 12/05/22 12/26/22  Aida Raider, NP      Allergies    Amiodarone, Dexilant [dexlansoprazole], Flomax [tamsulosin hcl], Nexium [esomeprazole], and Pantoprazole sodium    Review of Systems   Review of Systems  Respiratory:  Positive for cough and shortness of breath.     Physical Exam Updated Vital Signs BP (!) 147/69 (BP Location: Right Arm)   Pulse 67  Temp 97.7 F (36.5 C) (Oral)   Resp 19   SpO2 99%  Physical Exam CONSTITUTIONAL: Elderly, well-appearing, no acute distress HEAD: Normocephalic/atraumatic EYES: EOMI/PERRL ENMT: Mucous membranes moist, uvula midline, no stridor, no drooling, no dysphonia NECK: supple no meningeal signs, no JVD CV: S1/S2 noted, no murmurs/rubs/gallops noted LUNGS: Lungs are clear to auscultation bilaterally, no apparent distress NEURO: Pt is awake/alert/appropriate, moves all extremitiesx4.  No facial droop.   EXTREMITIES: pulses normal/equal, full ROM, mild symmetric edema lower bilateral extremities, no tenderness SKIN: warm, color  normal PSYCH: no abnormalities of mood noted, alert and oriented to situation  ED Results / Procedures / Treatments   Labs (all labs ordered are listed, but only abnormal results are displayed) Labs Reviewed  BASIC METABOLIC PANEL - Abnormal; Notable for the following components:      Result Value   Glucose, Bld 189 (*)    BUN 24 (*)    Creatinine, Ser 1.27 (*)    GFR, Estimated 42 (*)    All other components within normal limits  CBC - Abnormal; Notable for the following components:   RBC 3.85 (*)    MCV 101.8 (*)    All other components within normal limits  TROPONIN I (HIGH SENSITIVITY)    EKG  ED ECG REPORT   Date: 12/17/2022 2109  Rate: 68  Rhythm: normal sinus rhythm  QRS Axis: normal  Intervals: normal  ST/T Wave abnormalities: nonspecific ST changes  Conduction Disutrbances:first-degree A-V block  RBBB  Narrative Interpretation:   Old EKG Reviewed: unchanged  I have personally reviewed the EKG tracing and agree with the computerized printout as noted.   Radiology DG Chest 2 View  Result Date: 12/17/2022 CLINICAL DATA:  Chest pain, shortness of breath EXAM: CHEST - 2 VIEW COMPARISON:  11/03/2019 FINDINGS: Transverse diameter of heart is in upper limits of normal. There are no signs of alveolar pulmonary edema or focal pulmonary consolidation. Small linear densities are seen in lower lung fields with no significant interval change suggesting minimal scarring. Lateral CP angles are clear. IMPRESSION: There are no signs of pulmonary edema or focal pulmonary consolidation. No significant interval changes are noted in small linear densities in the lower lung fields. Electronically Signed   By: Ernie Avena M.D.   On: 12/17/2022 21:43    Procedures Procedures    Medications Ordered in ED Medications - No data to display  ED Course/ Medical Decision Making/ A&P Clinical Course as of 12/18/22 0019  Wed Dec 17, 2022  2304 Glucose(!): 189 hyperglycemia [DW]   Thu Dec 18, 2022  0018 Patient walked in the ER without difficulty, no hypoxia, pulse ox 97% [DW]  0019 Patient notes the symptoms have been chronic and she is here looking for answers.  Overall ER workup was unrevealing.  At this point she is safe for discharge home, we discussed return precautions [DW]    Clinical Course User Index [DW] Zadie Rhine, MD                             Medical Decision Making Amount and/or Complexity of Data Reviewed Labs: ordered. Decision-making details documented in ED Course. Radiology: ordered.   This patient presents to the ED for concern of shortness of breath, this involves an extensive number of treatment options, and is a complaint that carries with it a high risk of complications and morbidity.  The differential diagnosis includes but is not limited to Acute  coronary syndrome, pneumonia, acute pulmonary edema, pneumothorax, acute anemia, pulmonary embolism    Comorbidities that complicate the patient evaluation: Patient's presentation is complicated by their history of atrial fibrillation, hypertension   Additional history obtained: Records reviewed  cardiology notes reviewed  Lab Tests: I Ordered, and personally interpreted labs.  The pertinent results include: Mild hyperglycemia  Imaging Studies ordered: I ordered imaging studies including chest x-ray I independently visualized and interpreted imaging which showed no acute findings I agree with the radiologist interpretation  Cardiac Monitoring: The patient was maintained on a cardiac monitor.  I personally viewed and interpreted the cardiac monitor which showed an underlying rhythm of:  sinus rhythm   Test Considered: Patient symptoms are chronic for several months, no indication for admission  Reevaluation: After the interventions noted above, I reevaluated the patient and found that they have :stayed the same  Complexity of problems addressed: Patient's presentation is  most consistent with  acute presentation with potential threat to life or bodily function  Disposition: After consideration of the diagnostic results and the patient's response to treatment,  I feel that the patent would benefit from discharge   .           Final Clinical Impression(s) / ED Diagnoses Final diagnoses:  Shortness of breath    Rx / DC Orders ED Discharge Orders     None         Zadie Rhine, MD 12/18/22 [phone]

## 2022-12-18 NOTE — ED Notes (Signed)
Pt amb around nurses station and did well.  No SOB  lowest O2 sat went was 97%.

## 2022-12-19 DIAGNOSIS — Z85828 Personal history of other malignant neoplasm of skin: Secondary | ICD-10-CM | POA: Diagnosis not present

## 2022-12-19 DIAGNOSIS — L304 Erythema intertrigo: Secondary | ICD-10-CM | POA: Diagnosis not present

## 2022-12-19 DIAGNOSIS — C44719 Basal cell carcinoma of skin of left lower limb, including hip: Secondary | ICD-10-CM | POA: Diagnosis not present

## 2022-12-19 DIAGNOSIS — L570 Actinic keratosis: Secondary | ICD-10-CM | POA: Diagnosis not present

## 2022-12-19 DIAGNOSIS — L718 Other rosacea: Secondary | ICD-10-CM | POA: Diagnosis not present

## 2022-12-19 DIAGNOSIS — C44519 Basal cell carcinoma of skin of other part of trunk: Secondary | ICD-10-CM | POA: Diagnosis not present

## 2022-12-19 DIAGNOSIS — L821 Other seborrheic keratosis: Secondary | ICD-10-CM | POA: Diagnosis not present

## 2022-12-25 DIAGNOSIS — K219 Gastro-esophageal reflux disease without esophagitis: Secondary | ICD-10-CM | POA: Diagnosis not present

## 2022-12-25 DIAGNOSIS — R030 Elevated blood-pressure reading, without diagnosis of hypertension: Secondary | ICD-10-CM | POA: Diagnosis not present

## 2022-12-25 DIAGNOSIS — Z6829 Body mass index (BMI) 29.0-29.9, adult: Secondary | ICD-10-CM | POA: Diagnosis not present

## 2022-12-25 DIAGNOSIS — R0789 Other chest pain: Secondary | ICD-10-CM | POA: Diagnosis not present

## 2022-12-25 DIAGNOSIS — R600 Localized edema: Secondary | ICD-10-CM | POA: Diagnosis not present

## 2023-01-05 ENCOUNTER — Other Ambulatory Visit (HOSPITAL_COMMUNITY): Payer: Self-pay | Admitting: Adult Health

## 2023-01-05 DIAGNOSIS — Z1231 Encounter for screening mammogram for malignant neoplasm of breast: Secondary | ICD-10-CM

## 2023-01-08 ENCOUNTER — Encounter: Payer: Self-pay | Admitting: Internal Medicine

## 2023-01-12 DIAGNOSIS — J45909 Unspecified asthma, uncomplicated: Secondary | ICD-10-CM | POA: Diagnosis not present

## 2023-01-12 DIAGNOSIS — Z6828 Body mass index (BMI) 28.0-28.9, adult: Secondary | ICD-10-CM | POA: Diagnosis not present

## 2023-01-12 DIAGNOSIS — Z20828 Contact with and (suspected) exposure to other viral communicable diseases: Secondary | ICD-10-CM | POA: Diagnosis not present

## 2023-01-12 DIAGNOSIS — R030 Elevated blood-pressure reading, without diagnosis of hypertension: Secondary | ICD-10-CM | POA: Diagnosis not present

## 2023-01-12 DIAGNOSIS — R059 Cough, unspecified: Secondary | ICD-10-CM | POA: Diagnosis not present

## 2023-01-19 ENCOUNTER — Other Ambulatory Visit: Payer: Self-pay | Admitting: Cardiology

## 2023-01-20 DIAGNOSIS — H903 Sensorineural hearing loss, bilateral: Secondary | ICD-10-CM | POA: Diagnosis not present

## 2023-01-22 ENCOUNTER — Other Ambulatory Visit: Payer: Self-pay | Admitting: Cardiology

## 2023-01-28 ENCOUNTER — Encounter: Payer: Self-pay | Admitting: Cardiology

## 2023-01-28 ENCOUNTER — Ambulatory Visit: Payer: Medicare PPO | Attending: Cardiology | Admitting: Cardiology

## 2023-01-28 ENCOUNTER — Encounter: Payer: Self-pay | Admitting: *Deleted

## 2023-01-28 VITALS — BP 130/68 | HR 68 | Ht 67.0 in | Wt 193.8 lb

## 2023-01-28 DIAGNOSIS — R0789 Other chest pain: Secondary | ICD-10-CM

## 2023-01-28 DIAGNOSIS — D6869 Other thrombophilia: Secondary | ICD-10-CM | POA: Diagnosis not present

## 2023-01-28 DIAGNOSIS — I10 Essential (primary) hypertension: Secondary | ICD-10-CM

## 2023-01-28 DIAGNOSIS — R079 Chest pain, unspecified: Secondary | ICD-10-CM

## 2023-01-28 DIAGNOSIS — I4819 Other persistent atrial fibrillation: Secondary | ICD-10-CM | POA: Diagnosis not present

## 2023-01-28 MED ORDER — DILTIAZEM HCL ER 120 MG PO CP12: 120.0000 mg | ORAL_CAPSULE | Freq: Two times a day (BID) | ORAL | 6 refills | Status: DC

## 2023-01-28 NOTE — Progress Notes (Signed)
Electrophysiology Office Note:   Date:  01/28/2023  ID:  Hailey [name], DOB 03/24/1941, MRN [id number]  Primary Cardiologist: Hailey Docker, MD (Inactive) Electrophysiologist: Hailey Kurtenbach Jorja Loa, MD    History of Present Illness:   Hailey [name] is a 82 y.o. female with h/o atrial fibrillation seen today for routine electrophysiology followup.  Since last being seen in our clinic the patient reports doing okay.  She has been having episodes of hoarseness and cough.  She feels that it is potentially related to reflux.  She is seeing gastroenterology.  Starting yesterday, she began to have some chest discomfort.  Discomfort is in the center of her chest and nonradiating.  It is not associated with exertion.  She had a low calcium score on her prior preablation CT, though this is quite worrisome to her.  There are no obvious exacerbating or alleviating factors.  She is also had some lower extremity edema that is worsened over the last few days.  she denies palpitations, dyspnea, PND, orthopnea, nausea, vomiting, dizziness, syncope, weight gain, or early satiety.     He has a history of atrial fibrillation post ablation x 2, most recently 05/11/2019.  She continued to have episodes of atrial fibrillation and is now on flecainide.  She had syncope due to bradycardia and her beta-blocker was stopped and is now on diltiazem.     Review of systems complete and found to be negative unless listed in HPI.   Studies Reviewed:    EKG is ordered today. Personal review as below.  EKG Interpretation  Date/Time:  Wednesday January 28 2023 09:38:03 EDT Ventricular Rate:  62 PR Interval:  230 QRS Duration: 132 QT Interval:  450 QTC Calculation: 456 R Axis:   70 Text Interpretation: Sinus rhythm with 1st degree A-V block Right bundle branch block T wave abnormality, consider inferolateral ischemia When compared with ECG of 17-Dec-2022 21:09, No significant change was found Confirmed by Hailey [name]  [phone]) on 01/28/2023 9:41:47 AM     Risk Assessment/Calculations:    CHA2DS2-VASc Score = 4   This indicates a 4.8% annual risk of stroke. The patient's score is based upon: CHF History: 0 HTN History: 1 Diabetes History: 0 Stroke History: 0 Vascular Disease History: 0 Age Score: 2 Gender Score: 1             Physical Exam:   VS:  BP 130/68   Pulse 68   Ht 5\' 7"  (1.702 m)   Wt 193 lb 12.8 oz (87.9 kg)   SpO2 98%   BMI 30.35 kg/m    Wt Readings from Last 3 Encounters:  01/28/23 193 lb 12.8 oz (87.9 kg)  12/09/22 195 lb 3.2 oz (88.5 kg)  10/23/22 195 lb 12.8 oz (88.8 kg)     GEN: Well nourished, well developed in no acute distress NECK: No JVD; No carotid bruits CARDIAC: Regular rate and rhythm, no murmurs, rubs, gallops RESPIRATORY:  Clear to auscultation without rales, wheezing or rhonchi  ABDOMEN: Soft, non-tender, non-distended EXTREMITIES:  No edema; No deformity   ASSESSMENT AND PLAN:    1.  Persistent atrial fibrillation: Currently on flecainide, diltiazem, Eliquis.  Post ablation x 2 most recently 05/11/2019.  Remains in sinus rhythm.  She is having some fatigue.  Hailey [name] decrease diltiazem to 120 mg daily.  2.  Hypertension: Currently well-controlled  3.  Secondary hypercoagulable state: Currently on Eliquis for atrial fibrillation  4.  Chest pain: Has somewhat atypical symptoms.  Despite this, she is  quite concerned.  She had a low coronary calcium score 4 years ago.  Hailey [name] plan for coronary CTA.  Follow up with EP APP in 6 months  Signed, Hailey Canipe Jorja Loa, MD

## 2023-01-28 NOTE — Patient Instructions (Addendum)
Medication Instructions:  Your physician has recommended you make the following change in your medication:  DECREASE Diltaizem to 120 mg daily  *If you need a refill on your cardiac medications before your next appointment, please call your pharmacy*   Lab Work: None ordered   Testing/Procedures: Your physician has requested that you have cardiac CT. Cardiac computed tomography (CT) is a painless test that uses an x-ray machine to take clear, detailed pictures of your heart. For further information please visit [email or web address] Please follow instruction sheet as given.   Follow-Up: At [facility], you and your health needs are our priority.  As part of our continuing mission to provide you with exceptional heart care, we have created designated Provider Care Teams.  These Care Teams include your primary Cardiologist (physician) and Advanced Practice Providers (APPs -  Physician Assistants and Nurse Practitioners) who all work together to provide you with the care you need, when you need it.  Your next appointment:   6 month(s)  The format for your next appointment:   In Person  Provider:   You will see one of the following Advanced Practice Providers on your designated Care Team:   Francis Dowse, [place] Casimiro Needle "Mardelle Matte" Lanna Poche, [place]   Thank you for choosing [facility]!!   Dory Horn, RN [phone]  Other Instructions

## 2023-01-30 ENCOUNTER — Telehealth: Payer: Self-pay | Admitting: Cardiology

## 2023-01-30 MED ORDER — DILTIAZEM HCL ER COATED BEADS 120 MG PO CP24: 120.0000 mg | ORAL_CAPSULE | Freq: Every day | ORAL | 3 refills | Status: DC

## 2023-01-30 NOTE — Telephone Encounter (Signed)
   Pt c/o medication issue:  1. Name of Medication: diltiazem   2. How are you currently taking this medication (dosage and times per day)?   3. Are you having a reaction (difficulty breathing--STAT)?   4. What is your medication issue?   The patient stated that Dr. Elberta Fortis has reduced her dosage of this medication and would like to speak with Sherri before she takes it today.

## 2023-01-30 NOTE — Telephone Encounter (Signed)
Spoke with pt who states she picked up new RX of Dilt 120mg  - 1 tablet daily as prescribed by Dr Elberta Fortis on 06/26 the Rx says Dilt SR - 120 - 1 tablet by mouth twice daily.  Pt is asking if this is  the correct medication and should she be taking it twice daily or once daily.  Pt advised Ditiazem SR - 1 tablet twice daily is the sustained released Diltiazem and would need to be taken twice daily but RN will send in the Diltiazem ER 120mg  - 1 tablet by mouth daily as ordered by Dr Elberta Fortis.  Pt verbalizes understanding and thanked Charity fundraiser for the assistance.  Order placed for Diltiazem ER 120mg  - 1 tablet by mouth daily [facility] to [facility] on file and RN spoke with pharmacist and advised Diltiazem SR 120mg  - 1 tablet by mouth twice daily has been canceled and new Rx sent to pharmacy.  Receipt has been confirmed by the pharmacy.

## 2023-02-04 DIAGNOSIS — M47816 Spondylosis without myelopathy or radiculopathy, lumbar region: Secondary | ICD-10-CM | POA: Diagnosis not present

## 2023-02-06 ENCOUNTER — Encounter: Payer: Self-pay | Admitting: Cardiology

## 2023-02-06 NOTE — Telephone Encounter (Signed)
Left message to call back  

## 2023-02-09 ENCOUNTER — Telehealth: Payer: Self-pay | Admitting: Cardiology

## 2023-02-09 NOTE — Telephone Encounter (Signed)
Followed up with pt. Feeling better than last week. BPs avg 110s-120s, HRs 70-80s. Occasional BPs low 101/71, 107/70 - symptomatic when low. Pt aware HRs WNL, and most BPs are also WNL. Pt is going to continue monitoring htings for now and will let us know if we need to readdress things. She appreciates my call/discussion and agreeable to plan.

## 2023-02-09 NOTE — Telephone Encounter (Signed)
Patient is returning call in regards to MyChart message left 07/05. Requesting return call.

## 2023-02-09 NOTE — Telephone Encounter (Signed)
See telephone note.

## 2023-02-09 NOTE — Telephone Encounter (Signed)
Patient was returning your call from [place] message 7/5. Her blood pressure yesterday was 144/78 hr 71 at 730 pm  and 107/70 hr 75 one hour after her medication. Around 5 am she did have slight headache and felt nauseated. BP readings this morning 127/83 hr 88  5 am. 126/74 hr 79 8 am. She currently feels fine bur would like a call back from you.  Mychart message below  Good morning Dr. Elberta Fortis , hope your Fourth of July weekend was good. Mine was not so good. Debated texting but decided to go ahead and do it. I have been taking the lower amount of diltiazem (120 mg) for a week. Everything seemed to be going well until the morning of July fourth. When I got up I felt awful. I took my blood pressure. It was 88/69. My pulse was 95. I felt hot, clammy,sick, weak, faint. I laid back down for a while. The episode passed. I still felt a little weak and blah all day. When I got up this morning I felt better but still a little blah. My blood pressure was 122/82. Pulse was. 80. Is this something to be worried about? What do I do? Do I keep taking the lower dose of diltiazem (120 mg ) or go back to the higher dose(180 mg) or what). If I go back to higher dose, I will need a new prescription. [facility] [place] [facility].

## 2023-02-10 ENCOUNTER — Ambulatory Visit: Payer: Medicare PPO | Admitting: Internal Medicine

## 2023-02-10 ENCOUNTER — Encounter: Payer: Self-pay | Admitting: Internal Medicine

## 2023-02-10 VITALS — BP 142/69 | HR 61 | Temp 97.3°F | Ht 67.0 in | Wt 196.6 lb

## 2023-02-10 DIAGNOSIS — R109 Unspecified abdominal pain: Secondary | ICD-10-CM | POA: Diagnosis not present

## 2023-02-10 DIAGNOSIS — R0789 Other chest pain: Secondary | ICD-10-CM | POA: Diagnosis not present

## 2023-02-10 NOTE — Progress Notes (Unsigned)
Primary Care Physician:  Richardean Chimera, MD Primary Gastroenterologist:  Dr. Jena Gauss  Pre-Procedure History & Physical: HPI:  Hailey [name] is a 82 y.o. female here for follow-up of atypical chest abdominal pain.  Followed by Dr. Elberta Fortis.  According to his note recently did not feel she had active coronary disease coronary calcium CT is planned.  Since I last saw her, she states her abdominal pain has resolved back to 20 mg of omeprazole twice daily.  Good bowel function no dysphagia no nausea or vomiting appetite good.  Has not been taking omeprazole before meal.  Incidentally, she is aged out of colorectal cancer screening she has had multiple colonoscopies through [facility] .  Documentations unavailable to me.  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   Migraine headache    Palpitations    Restless leg syndrome    Vaginal atrophy     Past Surgical History:  Procedure Laterality Date   APPENDECTOMY     ATRIAL FIBRILLATION ABLATION N/A 08/20/2018   Procedure: ATRIAL FIBRILLATION ABLATION;  Surgeon: Regan Lemming, MD;  Location: [facility];  Service: Cardiovascular;  Laterality: N/A;   ATRIAL FIBRILLATION ABLATION N/A 05/11/2019   Procedure: ATRIAL FIBRILLATION ABLATION;  Surgeon: Regan Lemming, MD;  Location: [facility];  Service: Cardiovascular;  Laterality: N/A;   BIOPSY  06/06/2020   Procedure: BIOPSY;  Surgeon: Corbin Ade, MD;  Location: [facility];  Service: Endoscopy;;  gastric   CARDIAC CATHETERIZATION  08/05/2003   cone   CARDIOVERSION N/A 05/02/2019   Procedure: CARDIOVERSION;  Surgeon: Jodelle Red, MD;  Location: [facility] ENDOSCOPY;  Service: Cardiovascular;  Laterality: N/A;   COLONOSCOPY     ESOPHAGOGASTRODUODENOSCOPY N/A 06/06/2020   benign gastric polyps and normal  esophagus status post dilation, normal duodenum   HERNIA REPAIR     LEFT HEART CATHETERIZATION WITH CORONARY ANGIOGRAM N/A 04/12/2012   Procedure: LEFT HEART CATHETERIZATION WITH CORONARY ANGIOGRAM;  Surgeon: Kathleene Hazel, MD;  Location: [facility] CATH LAB;  Service: Cardiovascular;  Laterality: N/A;   MALONEY DILATION N/A 06/06/2020   Procedure: [name] DILATION;  Surgeon: Corbin Ade, MD;  Location: [facility];  Service: Endoscopy;  Laterality: N/A;   POLYPECTOMY  06/06/2020   Procedure: POLYPECTOMY;  Surgeon: Corbin Ade, MD;  Location: [facility];  Service: Endoscopy;;  gastric   TOTAL ABDOMINAL HYSTERECTOMY      Prior to Admission medications   Medication Sig Start Date End Date Taking? Authorizing Provider  acetaminophen (TYLENOL) 650 MG CR tablet Take 1,300 mg by mouth 2 (two) times daily.   Yes [provider]  apixaban (ELIQUIS) 5 MG TABS tablet Take 5 mg by mouth 2 (two) times daily.   Yes [provider]  Calcium Carb-Cholecalciferol (CALCIUM 600+D3 PO) Take 1 tablet by mouth daily.    Yes [provider]  calcium carbonate (TUMS - DOSED IN MG ELEMENTAL CALCIUM) 500 MG chewable tablet Chew 1 tablet by mouth as needed for indigestion or heartburn.   Yes [provider]  diclofenac Sodium (VOLTAREN) 1 % GEL Apply topically as directed.   Yes [provider]  diltiazem (CARDIZEM CD) 120 MG 24 hr capsule Take 1 capsule (120 mg total) by mouth daily. 01/30/23  Yes  Camnitz, Will Daphine Deutscher, MD  flecainide (TAMBOCOR) 50 MG tablet TAKE 1 TABLET BY MOUTH TWICE A DAY 01/23/23  Yes Camnitz, Will Daphine Deutscher, MD  fluticasone (FLONASE) 50 MCG/ACT nasal spray Place 2 sprays into both nostrils daily. 09/05/22  Yes [provider]  furosemide (LASIX) 20 MG tablet Take 20 mg by mouth daily as needed for fluid or edema. Pt only takes 1/2 tablet 01/20/23  Yes [provider]  gabapentin (NEURONTIN) 100 MG capsule Take 100 mg by mouth at  bedtime.   Yes [provider]  nystatin cream (MYCOSTATIN) Apply 1 Application topically 3 (three) times daily. 11/28/22  Yes [provider]  omeprazole (PRILOSEC) 20 MG capsule Take 20 mg by mouth 2 (two) times daily before a meal.   Yes [provider]  OVER THE COUNTER MEDICATION Take 1 capsule by mouth daily. Focus Select Areds2 for macular degeneration   Yes [provider]    Allergies as of 02/10/2023 - Review Complete 02/10/2023  Allergen Reaction Noted   Amiodarone Itching 04/24/2018   Dexilant [dexlansoprazole] Diarrhea and Other (See Comments) 07/17/2020   Flomax [tamsulosin hcl] Nausea And Vomiting 01/24/2015   Nexium [esomeprazole] Other (See Comments) 01/02/2021   Pantoprazole sodium Other (See Comments) 07/17/2020    Family History  Problem Relation Age of Onset   Coronary artery disease Father        mid 22s   Hypertension Mother    Stroke Brother    Transient ischemic attack Brother    Other Brother        on oxygen   Hypertension Brother    Stroke Brother    Heart disease Brother    Heart attack Brother    Other Sister        a fib, restless leg   Other Sister        pressure in eyes   Colon cancer Neg Hx    Gastric cancer Neg Hx    Esophageal cancer Neg Hx     Social History   Socioeconomic History   Marital status: Single    Spouse name: Not on file   Number of children: 0   Years of education: College   Highest education level: Not on file  Occupational History   Occupation: retired  Tobacco Use   Smoking status: Never   Smokeless tobacco: Never  Vaping Use   Vaping Use: Never used  Substance and Sexual Activity   Alcohol use: Not Currently    Comment: rarely; glass of wine with dinner   Drug use: No   Sexual activity: Not Currently    Birth control/protection: Surgical    Comment: hyst  Other Topics Concern   Not on file  Social History Narrative   Single. Retired.    Patient lives at home alone.    Caffeine Use: none   Social Determinants of Corporate investment banker Strain: Not on file  Food Insecurity: Not on file  Transportation Needs: Not on file  Physical Activity: Not on file  Stress: Not on file  Social Connections: Not on file  Intimate Partner Violence: Not on file    Review of Systems: See HPI, otherwise negative ROS  Physical Exam: BP (!) 142/69 (BP Location: Right Arm, Patient Position: Sitting, Cuff Size: Normal)   Pulse 61   Temp (!) 97.3 F (36.3 C) (Oral)   Ht 5\' 7"  (1.702 m)   Wt 196 lb 9.6 oz (89.2 kg)   SpO2 95%   BMI  30.79 kg/m  General:   Alert,  Well-developed, well-nourished, pleasant and cooperative in NAD Neck:  Supple; no masses or thyromegaly. No significant cervical adenopathy. Lungs:  Clear throughout to auscultation.   No wheezes, crackles, or rhonchi. No acute distress. Heart:  Regular rate and rhythm; no murmurs, clicks, rubs,  or gallops. Abdomen: Non-distended, normal bowel sounds.  Soft and nontender without appreciable mass or hepatosplenomegaly.    Impression/Plan: 82 year old lady with longstanding GERD presented-earlier this with atypical chest abdominal pain.  Switched her past depression medicine to a p-cab.  Can like it.  Went back to her traditional omeprazole 20 mg twice daily.  She states that her symptoms have resolved.  We were contemplating a ambulatory pH/impedance study.  It appears now that will not be needed.  She is aged out for colorectal cancer screening.  Pressure minimally elevated today.  Clinically, doing well from a GI standpoint at this time.   Recommendations:   Continue omeprazole 20 mg twice daily-best taken 30 minutes before meals  Follow blood pressure closely  Unless something comes up, we will plan to see back in 1 year.      Notice: This dictation was prepared with Dragon dictation along with smaller phrase technology. Any transcriptional errors that result from this process are  unintentional and may not be corrected upon review.

## 2023-02-10 NOTE — Patient Instructions (Addendum)
It was good to see you again today!  Glad your GI symptoms have improved  Continue omeprazole 20 mg twice daily-best taken 30 minutes before meals  Follow your blood pressure closely  Unless something comes up, we will plan to see you back in 1 year.

## 2023-02-16 DIAGNOSIS — R5382 Chronic fatigue, unspecified: Secondary | ICD-10-CM | POA: Diagnosis not present

## 2023-02-16 DIAGNOSIS — N183 Chronic kidney disease, stage 3 unspecified: Secondary | ICD-10-CM | POA: Diagnosis not present

## 2023-02-16 DIAGNOSIS — K2100 Gastro-esophageal reflux disease with esophagitis, without bleeding: Secondary | ICD-10-CM | POA: Diagnosis not present

## 2023-02-16 DIAGNOSIS — E7849 Other hyperlipidemia: Secondary | ICD-10-CM | POA: Diagnosis not present

## 2023-02-19 DIAGNOSIS — I482 Chronic atrial fibrillation, unspecified: Secondary | ICD-10-CM | POA: Diagnosis not present

## 2023-02-19 DIAGNOSIS — E7849 Other hyperlipidemia: Secondary | ICD-10-CM | POA: Diagnosis not present

## 2023-02-19 DIAGNOSIS — I10 Essential (primary) hypertension: Secondary | ICD-10-CM | POA: Diagnosis not present

## 2023-02-19 DIAGNOSIS — E042 Nontoxic multinodular goiter: Secondary | ICD-10-CM | POA: Diagnosis not present

## 2023-02-19 DIAGNOSIS — N1832 Chronic kidney disease, stage 3b: Secondary | ICD-10-CM | POA: Diagnosis not present

## 2023-02-19 DIAGNOSIS — G2581 Restless legs syndrome: Secondary | ICD-10-CM | POA: Diagnosis not present

## 2023-02-19 DIAGNOSIS — Z6829 Body mass index (BMI) 29.0-29.9, adult: Secondary | ICD-10-CM | POA: Diagnosis not present

## 2023-02-19 DIAGNOSIS — J309 Allergic rhinitis, unspecified: Secondary | ICD-10-CM | POA: Diagnosis not present

## 2023-02-19 DIAGNOSIS — I340 Nonrheumatic mitral (valve) insufficiency: Secondary | ICD-10-CM | POA: Diagnosis not present

## 2023-02-22 DIAGNOSIS — R109 Unspecified abdominal pain: Secondary | ICD-10-CM | POA: Diagnosis not present

## 2023-02-23 ENCOUNTER — Ambulatory Visit (HOSPITAL_COMMUNITY): Payer: Medicare PPO

## 2023-02-25 ENCOUNTER — Ambulatory Visit (HOSPITAL_COMMUNITY): Payer: Medicare PPO

## 2023-02-26 ENCOUNTER — Ambulatory Visit (HOSPITAL_COMMUNITY)
Admission: RE | Admit: 2023-02-26 | Discharge: 2023-02-26 | Disposition: A | Discharge status: Home / Self Care | Payer: Medicare PPO | Source: Ambulatory Visit | Attending: Adult Health | Admitting: Adult Health

## 2023-02-26 DIAGNOSIS — Z1231 Encounter for screening mammogram for malignant neoplasm of breast: Secondary | ICD-10-CM | POA: Diagnosis not present

## 2023-03-08 ENCOUNTER — Emergency Department (HOSPITAL_COMMUNITY)
Admission: EM | Admit: 2023-03-08 | Discharge: 2023-03-08 | Disposition: A | Discharge status: Home / Self Care | Payer: Medicare PPO | Attending: Emergency Medicine | Admitting: Emergency Medicine

## 2023-03-08 ENCOUNTER — Emergency Department (HOSPITAL_COMMUNITY): Payer: Medicare PPO

## 2023-03-08 ENCOUNTER — Other Ambulatory Visit: Payer: Self-pay

## 2023-03-08 DIAGNOSIS — I10 Essential (primary) hypertension: Secondary | ICD-10-CM | POA: Insufficient documentation

## 2023-03-08 DIAGNOSIS — Z79899 Other long term (current) drug therapy: Secondary | ICD-10-CM | POA: Diagnosis not present

## 2023-03-08 DIAGNOSIS — Z7901 Long term (current) use of anticoagulants: Secondary | ICD-10-CM | POA: Insufficient documentation

## 2023-03-08 DIAGNOSIS — I2510 Atherosclerotic heart disease of native coronary artery without angina pectoris: Secondary | ICD-10-CM | POA: Insufficient documentation

## 2023-03-08 DIAGNOSIS — R0789 Other chest pain: Secondary | ICD-10-CM | POA: Insufficient documentation

## 2023-03-08 DIAGNOSIS — R079 Chest pain, unspecified: Secondary | ICD-10-CM

## 2023-03-08 DIAGNOSIS — I517 Cardiomegaly: Secondary | ICD-10-CM | POA: Diagnosis not present

## 2023-03-08 LAB — CBC WITH DIFFERENTIAL/PLATELET
Abs Immature Granulocytes: 0.01 10*3/uL (ref 0.00–0.07)
Basophils Absolute: 0.1 10*3/uL (ref 0.0–0.1)
Basophils Relative: 1 %
Eosinophils Absolute: 0.1 10*3/uL (ref 0.0–0.5)
Eosinophils Relative: 2 %
HCT: 40.5 % (ref 36.0–46.0)
Hemoglobin: 12.9 g/dL (ref 12.0–15.0)
Immature Granulocytes: 0 %
Lymphocytes Relative: 40 %
Lymphs Abs: 2.2 10*3/uL (ref 0.7–4.0)
MCH: 31.5 pg (ref 26.0–34.0)
MCHC: 31.9 g/dL (ref 30.0–36.0)
MCV: 99 fL (ref 80.0–100.0)
Monocytes Absolute: 0.5 10*3/uL (ref 0.1–1.0)
Monocytes Relative: 9 %
Neutro Abs: 2.7 10*3/uL (ref 1.7–7.7)
Neutrophils Relative %: 48 %
Platelets: 273 10*3/uL (ref 150–400)
RBC: 4.09 MIL/uL (ref 3.87–5.11)
RDW: 14.4 % (ref 11.5–15.5)
WBC: 5.5 10*3/uL (ref 4.0–10.5)
nRBC: 0 % (ref 0.0–0.2)

## 2023-03-08 LAB — TROPONIN I (HIGH SENSITIVITY)
Troponin I (High Sensitivity): 11 ng/L (ref <18)
Troponin I (High Sensitivity): 9 ng/L (ref <18)

## 2023-03-08 LAB — HEPATIC FUNCTION PANEL
ALT: 15 U/L (ref 0–44)
AST: 21 U/L (ref 15–41)
Albumin: 4 g/dL (ref 3.5–5.0)
Alkaline Phosphatase: 72 U/L (ref 38–126)
Bilirubin, Direct: 0.1 mg/dL (ref 0.0–0.2)
Indirect Bilirubin: 0.5 mg/dL (ref 0.3–0.9)
Total Bilirubin: 0.6 mg/dL (ref 0.3–1.2)
Total Protein: 7.2 g/dL (ref 6.5–8.1)

## 2023-03-08 LAB — PROTIME-INR
INR: 1.4 — ABNORMAL HIGH (ref 0.8–1.2)
Prothrombin Time: 17.7 seconds — ABNORMAL HIGH (ref 11.4–15.2)

## 2023-03-08 LAB — LIPASE, BLOOD: Lipase: 35 U/L (ref 11–51)

## 2023-03-08 LAB — BASIC METABOLIC PANEL
Anion gap: 10 (ref 5–15)
BUN: 24 mg/dL — ABNORMAL HIGH (ref 8–23)
CO2: 27 mmol/L (ref 22–32)
Calcium: 9.7 mg/dL (ref 8.9–10.3)
Chloride: 101 mmol/L (ref 98–111)
Creatinine, Ser: 1.36 mg/dL — ABNORMAL HIGH (ref 0.44–1.00)
GFR, Estimated: 39 mL/min — ABNORMAL LOW (ref >60)
Glucose, Bld: 98 mg/dL (ref 70–99)
Potassium: 4 mmol/L (ref 3.5–5.1)
Sodium: 138 mmol/L (ref 135–145)

## 2023-03-08 LAB — CBG MONITORING, ED: Glucose-Capillary: 100 mg/dL — ABNORMAL HIGH (ref 70–99)

## 2023-03-08 LAB — MAGNESIUM: Magnesium: 2.3 mg/dL (ref 1.7–2.4)

## 2023-03-08 MED ORDER — ASPIRIN 81 MG PO CHEW
324.0000 mg | CHEWABLE_TABLET | Freq: Once | ORAL | Status: AC
  Administered 2023-03-08: 324 mg via ORAL
  Filled 2023-03-08: qty 4

## 2023-03-08 MED ORDER — NITROGLYCERIN 2 % TD OINT
1.0000 [in_us] | TOPICAL_OINTMENT | Freq: Once | TRANSDERMAL | Status: AC
  Administered 2023-03-08: 1 [in_us] via TOPICAL
  Filled 2023-03-08: qty 1

## 2023-03-08 MED ORDER — FENTANYL CITRATE PF 50 MCG/ML IJ SOSY
25.0000 ug | PREFILLED_SYRINGE | Freq: Once | INTRAMUSCULAR | Status: AC
  Administered 2023-03-08: 25 ug via INTRAVENOUS
  Filled 2023-03-08: qty 1

## 2023-03-08 NOTE — ED Provider Notes (Signed)
    ED Course / MDM   Clinical Course as of 03/08/23 1108  Sun Mar 08, 2023  0718 82 y/o female presenting to ER with chest pain. On eliquis. Pending troponin and re assessment. Received sign out from Dr. Durwin Nora  [WS]  1059 Patient reports that her symptoms are all improved.  Troponin is negative x 2.  Reviewed EKG, no change from previous.  Unclear cause of symptoms, could be esophageal in nature as patient reports that the pain is burning.  Have been having similar symptoms intermittently for a month.  Advise follow-up with cardiology.  She reports that she is planning to get CT coronary calcium score.  Also planning to follow-up with GI this Friday.  Will place cardiology referral. Will discharge patient to home. All questions answered. Patient comfortable with plan of discharge. Return precautions discussed with patient and specified on the after visit summary.  [WS]    Clinical Course User Index [WS] Lonell Grandchild, MD   Medical Decision Making Amount and/or Complexity of Data Reviewed Labs: ordered. Radiology: ordered.  Risk OTC drugs. Prescription drug management.          Lonell Grandchild, MD 03/08/23 [phone]

## 2023-03-08 NOTE — ED Provider Notes (Signed)
EMERGENCY DEPARTMENT AT [facility] Provider Note   CSN: [id number] Arrival date & time: 03/08/23  0542     History  Chief Complaint  Patient presents with   Chest Pain    Hailey [name] is a 82 y.o. female.  HPI Patient presents for chest pain.  Medical history includes CAD, anxiety, atrial fibrillation, HTN, GERD.  She is followed by [facility].  She is on flecainide, diltiazem, Eliquis.  She underwent ablation 4 years ago.  She is typically in sinus rhythm.  She was last seen by cardiology in June.  At that time, she endorsed atypical chest pain symptoms.  Plan was for coronary CTA and 60-month follow-up.  Patient states that she had onset of intermittent substernal chest pain 1 month ago.  She has been treating this with omeprazole and Mylanta.  Today, she was woken up from sleep with similar chest pain, but more severe.  She took her morning dose of Eliquis.  She takes diltiazem at night.  She currently has ongoing substernal chest pain that does not radiate.  She has current nausea that she describes as mild.  She denies any other current symptoms.    Home Medications Prior to Admission medications   Medication Sig Start Date End Date Taking? Authorizing Provider  acetaminophen (TYLENOL) 650 MG CR tablet Take 1,300 mg by mouth 2 (two) times daily.    [provider]  apixaban (ELIQUIS) 5 MG TABS tablet Take 5 mg by mouth 2 (two) times daily.    [provider]  Calcium Carb-Cholecalciferol (CALCIUM 600+D3 PO) Take 1 tablet by mouth daily.     [provider]  calcium carbonate (TUMS - DOSED IN MG ELEMENTAL CALCIUM) 500 MG chewable tablet Chew 1 tablet by mouth as needed for indigestion or heartburn.    [provider]  diclofenac Sodium (VOLTAREN) 1 % GEL Apply topically as directed.    [provider]  diltiazem (CARDIZEM CD) 120 MG 24 hr capsule Take 1 capsule (120 mg total) by mouth daily. 01/30/23   Camnitz, Andree Coss,  MD  flecainide (TAMBOCOR) 50 MG tablet TAKE 1 TABLET BY MOUTH TWICE A DAY 01/23/23   Camnitz, Will Daphine Deutscher, MD  fluticasone [facility]) 50 MCG/ACT nasal spray Place 2 sprays into both nostrils daily. 09/05/22   [provider]  furosemide (LASIX) 20 MG tablet Take 20 mg by mouth daily as needed for fluid or edema. Pt only takes 1/2 tablet 01/20/23   [provider]  gabapentin (NEURONTIN) 100 MG capsule Take 100 mg by mouth at bedtime.    [provider]  nystatin cream (MYCOSTATIN) Apply 1 Application topically 3 (three) times daily. 11/28/22   [provider]  omeprazole (PRILOSEC) 20 MG capsule Take 20 mg by mouth 2 (two) times daily before a meal.    [provider]  OVER THE COUNTER MEDICATION Take 1 capsule by mouth daily. Focus Select Areds2 for macular degeneration    [provider]      Allergies    Amiodarone, Dexilant [dexlansoprazole], Flomax [tamsulosin hcl], Nexium [esomeprazole], and Pantoprazole sodium    Review of Systems   Review of Systems  Cardiovascular:  Positive for chest pain.  Gastrointestinal:  Positive for nausea.  All other systems reviewed and are negative.   Physical Exam Updated Vital Signs BP (!) 179/80   Pulse 71   Temp (!) 97.5 F (36.4 C) (Oral)   Resp 18   Ht 5\' 7"  (1.702 m)  Wt 88.9 kg   SpO2 100%   BMI 30.70 kg/m  Physical Exam Vitals and nursing note reviewed.  Constitutional:      General: She is not in acute distress.    Appearance: She is well-developed. She is not ill-appearing, toxic-appearing or diaphoretic.  HENT:     Head: Normocephalic and atraumatic.  Eyes:     Extraocular Movements: Extraocular movements intact.     Conjunctiva/sclera: Conjunctivae normal.  Cardiovascular:     Rate and Rhythm: Normal rate and regular rhythm.     Heart sounds: No murmur heard. Pulmonary:     Effort: Pulmonary effort is normal. No tachypnea or respiratory distress.     Breath sounds: Normal  breath sounds. No wheezing or rhonchi.  Chest:     Chest wall: No tenderness.  Abdominal:     Palpations: Abdomen is soft.     Tenderness: There is no abdominal tenderness.  Musculoskeletal:        General: No swelling. Normal range of motion.     Cervical back: Normal range of motion and neck supple.     Right lower leg: No edema.     Left lower leg: No edema.  Skin:    General: Skin is warm and dry.     Coloration: Skin is not cyanotic or pale.  Neurological:     General: No focal deficit present.     Mental Status: She is alert and oriented to person, place, and time.  Psychiatric:        Mood and Affect: Mood normal.        Behavior: Behavior normal.     ED Results / Procedures / Treatments   Labs (all labs ordered are listed, but only abnormal results are displayed) Labs Reviewed  PROTIME-INR - Abnormal; Notable for the following components:      Result Value   Prothrombin Time 17.7 (*)    INR 1.4 (*)    All other components within normal limits  CBG MONITORING, ED - Abnormal; Notable for the following components:   Glucose-Capillary 100 (*)    All other components within normal limits  CBC WITH DIFFERENTIAL/PLATELET  BASIC METABOLIC PANEL  MAGNESIUM  LIPASE, BLOOD  HEPATIC FUNCTION PANEL  TROPONIN I (HIGH SENSITIVITY)    EKG EKG Interpretation Date/Time:  Sunday March 08 2023 05:54:39 EDT Ventricular Rate:  73 PR Interval:  257 QRS Duration:  147 QT Interval:  412 QTC Calculation: 454 R Axis:   73  Text Interpretation: Sinus rhythm Prolonged PR interval Right bundle branch block Confirmed by Gloris Manchester ([id number]) on 03/08/2023 5:59:18 AM  Radiology No results found.  Procedures Procedures    Medications Ordered in ED Medications  nitroGLYCERIN (NITROGLYN) 2 % ointment 1 inch (has no administration in time range)  aspirin chewable tablet 324 mg (324 mg Oral Given 03/08/23 0605)  fentaNYL (SUBLIMAZE) injection 25 mcg (25 mcg Intravenous Given 03/08/23  0606)    ED Course/ Medical Decision Making/ A&P                                 Medical Decision Making Amount and/or Complexity of Data Reviewed Labs: ordered. Radiology: ordered.  Risk OTC drugs. Prescription drug management.   This patient presents to the ED for concern of chest pain, this involves an extensive number of treatment options, and is a complaint that carries with it a high risk of complications and morbidity.  The differential diagnosis includes ACS, GERD, pericarditis, pneumonia   Co morbidities that complicate the patient evaluation  CAD, anxiety, atrial fibrillation, HTN, GERD   Additional history obtained:  Additional history obtained from N/A External records from outside source obtained and reviewed including EMR   Lab Tests:  I Ordered, and personally interpreted labs.  The pertinent results include: Normal hemoglobin, no leukocytosis.  Remaining lab work pending at time of signout.   Imaging Studies ordered:  I ordered imaging studies including chest x-ray I independently visualized and interpreted imaging which showed (results pending at time of signout) I agree with the radiologist interpretation   Cardiac Monitoring: / EKG:  The patient was maintained on a cardiac monitor.  I personally viewed and interpreted the cardiac monitored which showed an underlying rhythm of: Sinus rhythm   Problem List / ED Course / Critical interventions / Medication management  Patient presenting for chest pain, described as intermittent over the past month but worsened this morning.  Pain woke her up from sleep at 4 AM.  She has ongoing pain and nausea on arrival.  Patient is overall well-appearing.  She has no chest tenderness.  Breathing is unlabored and lungs are clear to auscultation.  324 ASA was ordered.  Fentanyl was ordered for analgesia.  Workup was initiated.  Results of lab work were pending at time of signout.  Care of patient was signed to  oncoming ED provider.. I ordered medication including ASA, NTG, and fentanyl for chest pain Reevaluation of the patient after these medicines showed that the patient improved I have reviewed the patients home medicines and have made adjustments as needed   Social Determinants of Health:  Has access to outpatient care          Final Clinical Impression(s) / ED Diagnoses Final diagnoses:  Chest pain, unspecified type    Rx / DC Orders ED Discharge Orders     None         Gloris Manchester, MD 03/08/23 [phone]

## 2023-03-08 NOTE — ED Triage Notes (Signed)
Pt to ED from home cc mid chest pain that's been going on for a month. States it was worse this morning and woke her up from sleep at 4am. Endorses nausea.

## 2023-03-08 NOTE — Discharge Instructions (Addendum)
We evaluated you for your chest pain.  Your testing was reassuring.  We did not see any dangerous findings on your laboratory tests or EKG.  We are not sure of the exact cause of your symptoms, but definitely agree with following up with Dr. Jena Gauss this Friday and continuing to follow-up with Dr. Elberta Fortis.  I have placed a cardiology referral to help expedite your cardiology follow-up.  Your symptoms could be due to your hiatal hernia.  I think it is less likely a heart issue, but it would still be very important to make sure this is not the cause. I do not think you need to stay in the hospital for further testing.  If you have any new or worsening symptoms, recurrent symptoms, uncontrolled nausea or vomiting, or any other concerning symptoms, please return to the emergency department.

## 2023-03-13 ENCOUNTER — Encounter: Payer: Self-pay | Admitting: Internal Medicine

## 2023-03-13 ENCOUNTER — Ambulatory Visit: Payer: Medicare PPO | Admitting: Internal Medicine

## 2023-03-13 VITALS — BP 139/85 | HR 76 | Temp 97.3°F | Ht 67.0 in | Wt 198.4 lb

## 2023-03-13 DIAGNOSIS — R0789 Other chest pain: Secondary | ICD-10-CM

## 2023-03-13 DIAGNOSIS — R109 Unspecified abdominal pain: Secondary | ICD-10-CM

## 2023-03-13 MED ORDER — SUCRALFATE 1 G PO TABS: 1.0000 g | ORAL_TABLET | Freq: Three times a day (TID) | ORAL | 3 refills | Status: DC

## 2023-03-13 NOTE — Progress Notes (Signed)
Primary Care Physician:  Richardean Chimera, MD Primary Gastroenterologist:  Dr. Jena Gauss  Pre-Procedure History & Physical: HPI:  Hailey [name] is a 82 y.o. female here for further evaluation of chest abdominal pain.  States she has it all the time.  Sometimes awaken from sleep midnight or 2:00 in the morning.  Saw this lady back on 7/9.  For follow-up of abdominal and chest pain.  States symptoms have resolved.   Since she saw me, states she has had recurrent lower chest upper abdominal pain.  They be worse at night.  Not incited by eating.  States she had a respiratory tract infection a month ago and coughed incessantly.  Has decreased exercise tolerance over the past 1 year.  For instance, tells me she gets short of breath walking 125 feet to her mailbox..  This is previously not been the case.  We attempted to escalate acid suppression therapy but she had a multitude of side effects to various agents.  Not tolerate increasing omeprazole.  She currently denies dysphagia.  Prior EGD demonstrated normal esophagus subsequently, barium pill esophagram demonstrated no hiatal hernia or reflux.  Patent tubular esophagus.  Went to the ED earlier this week with chest pain.   Gallbladder is out slight biliary dilation-chronic.  Normal LFTs.  Symptoms not brought on by eating.  She has a great appetite.  Normal bowel function ;no melena or rectal bleeding.  States she otherwise sleeps well at night.  She is scheduled to have a coronary calcium score CT coming up. History of ventral hernia repair with mesh in place recent CT. She remains on Eliquis for atrial fibrillation.  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   Migraine headache    Palpitations    Restless leg syndrome    Vaginal atrophy     Past Surgical History:  Procedure Laterality Date    APPENDECTOMY     ATRIAL FIBRILLATION ABLATION N/A 08/20/2018   Procedure: ATRIAL FIBRILLATION ABLATION;  Surgeon: Regan Lemming, MD;  [facility];  Service: Cardiovascular;  Laterality: N/A;   ATRIAL FIBRILLATION ABLATION N/A 05/11/2019   Procedure: ATRIAL FIBRILLATION ABLATION;  Surgeon: Regan Lemming, MD;  Location: [facility];  Service: Cardiovascular;  Laterality: N/A;   BIOPSY  06/06/2020   Procedure: BIOPSY;  Surgeon: Corbin Ade, MD;  Location: [facility];  Service: Endoscopy;;  gastric   CARDIAC CATHETERIZATION  08/05/2003   cone   CARDIOVERSION N/A 05/02/2019   Procedure: CARDIOVERSION;  Surgeon: Jodelle Red, MD;  Location: [facility] ENDOSCOPY;  Service: Cardiovascular;  Laterality: N/A;   COLONOSCOPY     ESOPHAGOGASTRODUODENOSCOPY N/A 06/06/2020   benign gastric polyps and normal esophagus status post dilation, normal duodenum   HERNIA REPAIR     LEFT HEART CATHETERIZATION WITH CORONARY ANGIOGRAM N/A 04/12/2012   Procedure: LEFT HEART CATHETERIZATION WITH CORONARY ANGIOGRAM;  Surgeon: Kathleene Hazel, MD;  Location: [facility] CATH LAB;  Service: Cardiovascular;  Laterality: N/A;   MALONEY DILATION N/A 06/06/2020   Procedure: [name] DILATION;  Surgeon: Corbin Ade, MD;  [facility]: [facility];  Service: Endoscopy;  Laterality: N/A;   POLYPECTOMY  06/06/2020   Procedure: POLYPECTOMY;  Surgeon: Corbin Ade, MD;  [facility]: [facility];  Service: Endoscopy;;  gastric   TOTAL ABDOMINAL HYSTERECTOMY      Prior to Admission medications   Medication Sig Start Date End Date Taking? Authorizing Provider  acetaminophen (TYLENOL) 650 MG CR tablet Take 1,300 mg by mouth 2 (two) times daily.   Yes [provider]  aluminum-magnesium hydroxide 200-200 MG/5ML suspension Take by mouth every 6 (six) hours as needed for indigestion.   Yes [provider]  apixaban (ELIQUIS) 5 MG TABS tablet Take 5 mg by mouth 2 (two)  times daily.   Yes [provider]  Calcium Carb-Cholecalciferol (CALCIUM 600+D3 PO) Take 1 tablet by mouth daily.    Yes [provider]  calcium carbonate (TUMS - DOSED IN MG ELEMENTAL CALCIUM) 500 MG chewable tablet Chew 1 tablet by mouth as needed for indigestion or heartburn.   Yes [provider]  diclofenac Sodium (VOLTAREN) 1 % GEL Apply topically as directed.   Yes [provider]  diltiazem (CARDIZEM CD) 120 MG 24 hr capsule Take 1 capsule (120 mg total) by mouth daily. 01/30/23  Yes Camnitz, Will Daphine Deutscher, MD  flecainide (TAMBOCOR) 50 MG tablet TAKE 1 TABLET BY MOUTH TWICE A DAY 01/23/23  Yes Camnitz, Will Daphine Deutscher, MD  fluticasone (FLONASE) 50 MCG/ACT nasal spray Place 2 sprays into both nostrils daily. 09/05/22  Yes [provider]  furosemide (LASIX) 20 MG tablet Take 20 mg by mouth daily as needed for fluid or edema. Pt only takes 1/2 tablet 01/20/23  Yes [provider]  gabapentin (NEURONTIN) 100 MG capsule Take 100 mg by mouth at bedtime.   Yes [provider]  nystatin cream (MYCOSTATIN) Apply 1 Application topically 3 (three) times daily. 11/28/22  Yes [provider]  omeprazole (PRILOSEC) 20 MG capsule Take 20 mg by mouth 2 (two) times daily before a meal.   Yes [provider]  OVER THE COUNTER MEDICATION Take 1 capsule by mouth daily. Focus Select Areds2 for macular degeneration   Yes [provider]    Allergies as of 03/13/2023 - Review Complete 03/13/2023  Allergen Reaction Noted   Amiodarone Itching 04/24/2018   Dexilant [dexlansoprazole] Diarrhea and Other (See Comments) 07/17/2020   Flomax [tamsulosin hcl] Nausea And Vomiting 01/24/2015   Nexium [esomeprazole] Other (See Comments) 01/02/2021   Pantoprazole sodium Other (See Comments) 07/17/2020    Family History  Problem Relation Age of Onset   Coronary artery disease Father        mid 35s   Hypertension Mother    Stroke Brother     Transient ischemic attack Brother    Other Brother        on oxygen   Hypertension Brother    Stroke Brother    Heart disease Brother    Heart attack Brother    Other Sister        a fib, restless leg   Other Sister        pressure in eyes   Colon cancer Neg Hx    Gastric cancer Neg Hx    Esophageal cancer Neg Hx     Social History   Socioeconomic History   Marital status: Single    Spouse name: Not on file   Number of children: 0   Years of education: College   Highest education level: Not on file  Occupational History   Occupation: retired  Tobacco Use   Smoking status: Never   Smokeless tobacco: Never  Vaping Use   Vaping status: Never Used  Substance and Sexual Activity  Alcohol use: Not Currently    Comment: rarely; glass of wine with dinner   Drug use: No   Sexual activity: Not Currently    Birth control/protection: Surgical    Comment: hyst  Other Topics Concern   Not on file  Social History Narrative   Single. Retired.    Patient lives at home alone.   Caffeine Use: none   Social Determinants of Health   Financial Resource Strain: Low Risk  (10/21/2022)   Received from [facility], [facility]   Overall Financial Resource Strain (CARDIA)    Difficulty of Paying Living Expenses: Not hard at all  Food Insecurity: No Food Insecurity (10/21/2022)   Received from [facility], [facility]   Hunger Vital Sign    Worried About Running Out of Food in the Last Year: Never true    Ran Out of Food in the Last Year: Never true  Transportation Needs: No Transportation Needs (10/21/2022)   Received from [facility], [facility]   PRAPARE - Transportation    Lack of Transportation (Medical): No    Lack of Transportation (Non-Medical): No  Physical Activity: Not on file  Stress: Not on file  Social Connections: Unknown (04/24/2022)   Received from [facility], [facility]   Social Network    Social Network: Not on file  Intimate Partner  Violence: Unknown (04/24/2022)   Received from [facility], [facility]   HITS    Physically Hurt: Not on file    Insult or Talk Down To: Not on file    Threaten Physical Harm: Not on file    Scream or Curse: Not on file    Review of Systems: See HPI, otherwise negative ROS  Physical Exam: BP 139/85 (BP Location: Right Arm, Patient Position: Sitting, Cuff Size: Large)   Pulse 76   Temp (!) 97.3 F (36.3 C) (Oral)   Ht 5\' 7"  (1.702 m)   Wt 198 lb 6.4 oz (90 kg)   SpO2 99%   BMI 31.07 kg/m  General:   Alert,  Well-developed, well-nourished, pleasant and cooperative in NAD significant cervical adenopathy. Abdomen: Non-distended, normal bowel sounds.  Soft and nontender without appreciable mass or hepatosplenomegaly.   Impression/Plan: 82 year old lady with chronic intermittent retrosternal and upper abdominal pain.  Reflux symptoms over time.  No dysphagia.  Seen by cardiology - not felt to any cardiac issues but a calcium CT scan is planned.  We attempted to escalate acid suppression therapy previously but she had multiple intolerances to other PPIs and even intolerant to increasing her current omeprazole therapy.  Symptoms at night and during the day.  Mylanta prescribed by PCP has not made any difference.  We discussed the possibility of recent chest wall muscle strain from incessant coughing related to a respiratory tract infection recently.  Also, the possibility of underlying esophageal motility disorder reviewed.  I am concerned about her markedly reduced exercise tolerance over the past 1 year as outlined above.   Gallbladder is out.  Mild dilation of the biliary tree not significant given normal LFTs in the postcholecystectomy state.  She has not does not have any symptoms, whatsoever, consistent with mesenteric ischemia.  We previous discussed objective ambulatory pH monitoring.  We may have to circle back and pursue this although I think the yield may be  low.  Recommendations:  For now, continue omeprazole 20 mg orally twice daily before meals  Stop Mylanta add Carafate slurry 1 g before meals and nightly.  Follow-up with  Dr. Elberta Fortis.  Would like to get his take on markedly diminished exercise tolerance over the last 1 year.  Follow-up with [place] in 3 months    Notice: This dictation was prepared with Dragon dictation along with smaller phrase technology. Any transcriptional errors that result from this process are unintentional and may not be corrected upon review.

## 2023-03-13 NOTE — Patient Instructions (Signed)
It was good to see you again today!  You have had an extensive evaluation of your chest and abdomen without a definite cause of your symptoms being found.  Less likely you have acid reflux.  I am concerned about your decreased exercise tolerance (shortness of breath after walking just 125 feet).  I think it is a good idea for you to see Dr. Elberta Fortis back in the office.  Also possible you have strained your rib cage muscles from coughing related to your recent respiratory infection.  Do not have a hiatal hernia.  I doubt the ventral hernia repair is producing your symptoms at this time.  For now, I think you should continue taking omeprazole -you are taking it correctly-before breakfast and supper.  You may or may not need a future study to further evaluate the possibility of poorly controlled acid reflux.  GERD information provided.  Add Carafate suspension 1 g orally before meals and at bedtime.  Dispense 120 doses with 3 additional refills.  Stop Mylanta for now.  Office visit in 3 months.

## 2023-03-29 NOTE — Progress Notes (Unsigned)
Cardiology Office Note Date:  03/29/2023  Patient ID:  Hailey, [name] Sep 07, 1940, MRN [id number] PCP:  Richardean Chimera, MD  Electrophysiologist: Dr. Elberta Fortis  ***refresh   Chief Complaint: *** s/p ER visit  History of Present Illness: Hailey [name] is a 82 y.o. female with history of AFib, RBBB, HTN, HLD  She saw Dr. Elberta Fortis 01/28/23, mentioned a cough/hoarse voice she attributed to GERD, though had developed some CP, noting her pre-ablation CT nted low Ca burden, she was concerned about th symptoms, sounded atypical , planned for coronary CTa.  CTa not done  03/08/23: ER visit with c/o CP, reported intermittent for a month, woke with cp persisted and went for evaluation HS Trop neg x2 Labs unremarkable with some renal insufficiency noted not far from her baseline. EKG was SR w/RBBB  Saw GI 03/13/23: she also reported reduced exertional capacity over the last year getting winded earier.  Not suspected to be GI necessarily (with negative w/u in the past), have tried to escalate her PPI/acid reduction rx's though she has been intolerant.  Omeprazole continued, rec to f/u with cardiology. Recent URI/coughing, perhaps chest walletiology  *** flecainide, nodal blocker *** cp needs eval given flecainide *** elqiuis, dose, bleeding, labs are UTD *** burden by symptoms *** echo *** Never scheduled CT coronaries. ???    Device information PVI ablation 08/20/2018 PVI ablation 05/11/2019 (arrived to the procedure in an ATach)   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   Migraine headache    Palpitations    Restless leg syndrome    Vaginal atrophy     Past Surgical History:  Procedure Laterality Date   APPENDECTOMY     ATRIAL FIBRILLATION ABLATION N/A 08/20/2018   Procedure: ATRIAL FIBRILLATION ABLATION;  Surgeon:  Regan Lemming, MD;  Location: [facility];  Service: Cardiovascular;  Laterality: N/A;   ATRIAL FIBRILLATION ABLATION N/A 05/11/2019   Procedure: ATRIAL FIBRILLATION ABLATION;  Surgeon: Regan Lemming, MD;  Location: [facility];  Service: Cardiovascular;  Laterality: N/A;   BIOPSY  06/06/2020   Procedure: BIOPSY;  Surgeon: Corbin Ade, MD;  [facility]: [facility];  Service: Endoscopy;;  gastric   CARDIAC CATHETERIZATION  08/05/2003   cone   CARDIOVERSION N/A 05/02/2019   Procedure: CARDIOVERSION;  Surgeon: Jodelle Red, MD;  Location: [facility] ENDOSCOPY;  Service: Cardiovascular;  Laterality: N/A;   COLONOSCOPY     ESOPHAGOGASTRODUODENOSCOPY N/A 06/06/2020   benign gastric polyps and normal esophagus status post dilation, normal duodenum   HERNIA REPAIR     LEFT HEART CATHETERIZATION WITH CORONARY ANGIOGRAM N/A 04/12/2012   Procedure: LEFT HEART CATHETERIZATION WITH CORONARY ANGIOGRAM;  Surgeon: Kathleene Hazel, MD;  Location: [facility] CATH LAB;  Service: Cardiovascular;  Laterality: N/A;   MALONEY DILATION N/A 06/06/2020   Procedure: [name] DILATION;  Surgeon: Corbin Ade, MD;  [facility];  Service: Endoscopy;  Laterality: N/A;   POLYPECTOMY  06/06/2020   Procedure: POLYPECTOMY;  Surgeon: Corbin Ade, MD;  [facility];  Service: Endoscopy;;  gastric   TOTAL ABDOMINAL HYSTERECTOMY      Current Outpatient Medications  Medication Sig Dispense Refill   acetaminophen (TYLENOL) 650 MG CR tablet Take 1,300 mg by mouth 2 (two) times  daily.     aluminum-magnesium hydroxide 200-200 MG/5ML suspension Take by mouth every 6 (six) hours as needed for indigestion.     apixaban (ELIQUIS) 5 MG TABS tablet Take 5 mg by mouth 2 (two) times daily.     Calcium Carb-Cholecalciferol (CALCIUM 600+D3 PO) Take 1 tablet by mouth daily.      calcium carbonate (TUMS - DOSED IN MG ELEMENTAL CALCIUM) 500 MG chewable tablet Chew 1 tablet by mouth  as needed for indigestion or heartburn.     diclofenac Sodium (VOLTAREN) 1 % GEL Apply topically as directed.     diltiazem (CARDIZEM CD) 120 MG 24 hr capsule Take 1 capsule (120 mg total) by mouth daily. 90 capsule 3   flecainide (TAMBOCOR) 50 MG tablet TAKE 1 TABLET BY MOUTH TWICE A DAY 180 tablet 3   fluticasone (FLONASE) 50 MCG/ACT nasal spray Place 2 sprays into both nostrils daily.     furosemide (LASIX) 20 MG tablet Take 20 mg by mouth daily as needed for fluid or edema. Pt only takes 1/2 tablet     gabapentin (NEURONTIN) 100 MG capsule Take 100 mg by mouth at bedtime.     nystatin cream (MYCOSTATIN) Apply 1 Application topically 3 (three) times daily.     omeprazole (PRILOSEC) 20 MG capsule Take 20 mg by mouth 2 (two) times daily before a meal.     OVER THE COUNTER MEDICATION Take 1 capsule by mouth daily. Focus Select Areds2 for macular degeneration     sucralfate (CARAFATE) 1 g tablet Take 1 tablet (1 g total) by mouth 4 (four) times daily -  before meals and at bedtime. 120 tablet 3   No current facility-administered medications for this visit.    Allergies:   Amiodarone, Dexilant [dexlansoprazole], Flomax [tamsulosin hcl], Nexium [esomeprazole], and Pantoprazole sodium   Social History:  The patient  reports that she has never smoked. She has never used smokeless tobacco. She reports that she does not currently use alcohol. She reports that she does not use drugs.   Family History:  The patient's family history includes Coronary artery disease in her father; Heart attack in her brother; Heart disease in her brother; Hypertension in her brother and mother; Other in her brother, sister, and sister; Stroke in her brother and brother; Transient ischemic attack in her brother.***  ROS:  Please see the history of present illness.    All other systems are reviewed and otherwise negative.   PHYSICAL EXAM:  VS:  There were no vitals taken for this visit. BMI: There is no height or weight  on file to calculate BMI. Well nourished, well developed, in no acute distress HEENT: normocephalic, atraumatic Neck: no JVD, carotid bruits or masses Cardiac:  *** RRR; no significant murmurs, no rubs, or gallops Lungs:  *** CTA b/l, no wheezing, rhonchi or rales Abd: soft, nontender MS: no deformity or *** atrophy Ext: *** no edema Skin: warm and dry, no rash Neuro:  No gross deficits appreciated Psych: euthymic mood, full affect    EKG:  Done today and reviewed by myself shows      05/19/2021: TTE 1. Left ventricular ejection fraction, by estimation, is 60 to 65%. The  left ventricle has normal function. The left ventricle has no regional  wall motion abnormalities. There is mild concentric left ventricular  hypertrophy. Left ventricular diastolic  parameters are consistent with Grade II diastolic dysfunction  (pseudonormalization).   2. Right ventricular systolic function is normal. The right ventricular  size  is normal.   3. The mitral valve is normal in structure. Mild mitral valve  regurgitation. No evidence of mitral stenosis.   4. The aortic valve is normal in structure. Aortic valve regurgitation is  mild to moderate. No aortic stenosis is present.   5. The inferior vena cava is normal in size with greater than 50%  respiratory variability, suggesting right atrial pressure of 3 mmHg.     05/11/2019: EPS/ablation CONCLUSIONS: 1. Atrial tachycardia upon presentation.   2. Successful electrical isolation and anatomical encircling of the left pulmonary veins with radiofrequency current.  A WACA approach was used 3. Additional left atrial ablation was performed along the roof of the left atrium 4. No early apparent complications.  08/20/2018: EPS/ablation CONCLUSIONS: 1. Sinus rhythm upon presentation.   2. Successful electrical isolation and anatomical encircling of all four pulmonary veins with radiofrequency current.    3. Cavo-tricuspid isthmus ablation was  performed with complete bidirectional isthmus block achieved.  4. No inducible arrhythmias following ablation both on and off of dobutamine 5. No early apparent complications.   05/04/2019: cardiac CT IMPRESSION: 1. Calcium score 40 limited to LAD this is only 39 % for age and sex. Non obstructive 1-24% calcific plaque in proximal LAD 2.  Severe RAE and moderate LAE No LAA thrombus 3.  Normal aortic root 3.2 cm 4.  Normal PV anatomy including small right middle PV 5.  No pericardial effusion IMPRESSION: Cardiomegaly.  No acute extra cardiac abnormality.   Recent Labs: 03/08/2023: ALT 15; BUN 24; Creatinine, Ser 1.36; Hemoglobin 12.9; Magnesium 2.3; Platelets 273; Potassium 4.0; Sodium 138  No results found for requested labs within last 365 days.   CrCl cannot be calculated (Patient's most recent lab result is older than the maximum 21 days allowed.).   Wt Readings from Last 3 Encounters:  03/13/23 198 lb 6.4 oz (90 kg)  03/08/23 196 lb (88.9 kg)  02/10/23 196 lb 9.6 oz (89.2 kg)     Other studies reviewed: Additional studies/records reviewed today include: summarized above  ASSESSMENT AND PLAN:  Paroxysmal Afib CHA2DS2Vasc is 4, on Eliquis, *** appropriately dosed *** burden by symptoms ***  ATach  HTN ***  Secondary hypercoagulable state  CP Low Ca burden in 2020 ***    Disposition: F/u with ***  Current medicines are reviewed at length with the patient today.  The patient did not have any concerns regarding medicines.  Norma Fredrickson, PA-C 03/29/2023 1:03 PM     [facility] [address] [place] [address] [phone] (office)  [phone] (fax)

## 2023-03-31 ENCOUNTER — Ambulatory Visit: Payer: Medicare PPO | Attending: Physician Assistant | Admitting: Physician Assistant

## 2023-03-31 ENCOUNTER — Other Ambulatory Visit (INDEPENDENT_AMBULATORY_CARE_PROVIDER_SITE_OTHER): Payer: Medicare PPO

## 2023-03-31 ENCOUNTER — Encounter: Payer: Self-pay | Admitting: Physician Assistant

## 2023-03-31 VITALS — BP 142/78 | HR 79 | Ht 67.0 in | Wt 197.6 lb

## 2023-03-31 DIAGNOSIS — R079 Chest pain, unspecified: Secondary | ICD-10-CM

## 2023-03-31 DIAGNOSIS — I10 Essential (primary) hypertension: Secondary | ICD-10-CM | POA: Diagnosis not present

## 2023-03-31 DIAGNOSIS — I4719 Other supraventricular tachycardia: Secondary | ICD-10-CM | POA: Diagnosis not present

## 2023-03-31 DIAGNOSIS — R55 Syncope and collapse: Secondary | ICD-10-CM

## 2023-03-31 DIAGNOSIS — I4891 Unspecified atrial fibrillation: Secondary | ICD-10-CM

## 2023-03-31 DIAGNOSIS — D6869 Other thrombophilia: Secondary | ICD-10-CM | POA: Diagnosis not present

## 2023-03-31 NOTE — Patient Instructions (Signed)
Medication Instructions:     (MAKE SURE YOU TAKE YOUR DILTIAZEM 2 HOURS BEFORE YOUR SCHEDULED PROCEDURE )  *If you need a refill on your cardiac medications before your next appointment, please call your pharmacy*   Lab Work:  BMET  TODAY    If you have labs (blood work) drawn today and your tests are completely normal, you will receive your results only by: MyChart Message (if you have MyChart) OR A paper copy in the mail If you have any lab test that is abnormal or we need to change your treatment, we will call you to review the results.   Testing/Procedures: Your physician has requested that you have an echocardiogram. Echocardiography is a painless test that uses sound waves to create images of your heart. It provides your doctor with information about the size and shape of your heart and how well your heart's chambers and valves are working. This procedure takes approximately one hour. There are no restrictions for this procedure. Please do NOT wear cologne, perfume, aftershave, or lotions (deodorant is allowed). Please arrive 15 minutes prior to your appointment time.  Your physician has recommended that you wear an event monitor. Event monitors are medical devices that record the heart's electrical activity. Doctors most often [place] these monitors to diagnose arrhythmias. Arrhythmias are problems with the speed or rhythm of the heartbeat. The monitor is a small, portable device. You can wear one while you do your normal daily activities. This is usually used to diagnose what is causing palpitations/syncope (passing out).  Non-Cardiac CT scanning, (CAT scanning), is a noninvasive, special x-ray that produces cross-sectional images of the body using x-rays and a computer. CT scans help physicians diagnose and treat medical conditions. For some CT exams, a contrast material is used to enhance visibility in the area of the body being studied. CT scans provide greater clarity and reveal more  details than regular x-ray exams.    Follow-Up: At [facility], you and your health needs are our priority.  As part of our continuing mission to provide you with exceptional heart care, we have created designated Provider Care Teams.  These Care Teams include your primary Cardiologist (physician) and Advanced Practice Providers (APPs -  Physician Assistants and Nurse Practitioners) who all work together to provide you with the care you need, when you need it.  We recommend signing up for the patient portal called "MyChart".  Sign up information is provided on this After Visit Summary.  MyChart is used to connect with patients for Virtual Visits (Telemedicine).  Patients are able to view lab/test results, encounter notes, upcoming appointments, etc.  Non-urgent messages can be sent to your provider as well.   To learn more about what you can do with MyChart, go to [email or web address].    Your next appointment:   6 week(s)  Provider:   Loman Brooklyn, MD or Francis Dowse, PA-C    Other Instructions   \  Your cardiac CT will be scheduled at one of the below locations:   [facility] [address] [place], [place] [address] [phone]  OR  [facility] [address] [place], [place] [address] [phone]  OR   [facility] [address] [place], [place] [address] [phone]  If scheduled at [facility], please arrive at the [facility] and Children's Entrance (Entrance C2) of [facility] 30 minutes prior to test start time. You can use the FREE  valet parking offered at entrance C (encouraged to control the heart rate for the test)  Proceed to the [facility] Radiology Department (first floor) to check-in and test prep.  All radiology patients and guests should use entrance C2 at [facility], accessed from [facility], even though the hospital's  physical address listed is [address].    If scheduled at [facility] or [facility], please arrive 15 mins early for check-in and test prep.  There is spacious parking and easy access to the radiology department from the [facility] V[facility] entrance. Please enter here and check-in with the desk attendant.   Please follow these instructions carefully (unless otherwise directed):  An IV will be required for this test and Nitroglycerin will be given.   On the Night Before the Test: Be sure to Drink plenty of water. Do not consume any caffeinated/decaffeinated beverages or chocolate 12 hours prior to your test. Do not take any antihistamines 12 hours prior to your test.   On the Day of the Test: Drink plenty of water until 1 hour prior to the test. Do not eat any food 1 hour prior to test. You may take your regular medications prior to the test.  Take DILTIAZEM 2 hours prior to test. If you take Furosemide/Hydrochlorothiazide/Spironolactone, please HOLD on the morning of the test. FEMALES- please wear underwire-free bra if available, avoid dresses & tight clothing      After the Test: Drink plenty of water. After receiving IV contrast, you may experience a mild flushed feeling. This is normal. On occasion, you may experience a mild rash up to 24 hours after the test. This is not dangerous. If this occurs, you can take Benadryl 25 mg and increase your fluid intake. If you experience trouble breathing, this can be serious. If it is severe call 911 IMMEDIATELY. If it is mild, please call our office. If you take any of these medications: Glipizide/Metformin, Avandament, Glucavance, please do not take 48 hours after completing test unless otherwise instructed.  We will call to schedule your test 2-4 weeks out understanding that some insurance companies will need an authorization prior to the service being performed.   For  more information and frequently asked questions, please visit our website : [email or web address]  For non-scheduling related questions, please contact the cardiac imaging nurse navigator should you have any questions/concerns: Cardiac Imaging Nurse Navigators Direct Office Dial: [phone]   For scheduling needs, including cancellations and rescheduling, please call [place], [phone].

## 2023-03-31 NOTE — Progress Notes (Unsigned)
Enrolled patient for a 7 day Zio AT monitor to be mailed to patients home   [place] to read

## 2023-04-01 LAB — BASIC METABOLIC PANEL
BUN/Creatinine Ratio: 13 (ref 12–28)
BUN: 16 mg/dL (ref 8–27)
CO2: 27 mmol/L (ref 20–29)
Calcium: 9.4 mg/dL (ref 8.7–10.3)
Chloride: 107 mmol/L — ABNORMAL HIGH (ref 96–106)
Creatinine, Ser: 1.26 mg/dL — ABNORMAL HIGH (ref 0.57–1.00)
Glucose: 90 mg/dL (ref 70–99)
Potassium: 4.1 mmol/L (ref 3.5–5.2)
Sodium: 144 mmol/L (ref 134–144)
eGFR: 43 mL/min/{1.73_m2} — ABNORMAL LOW (ref >59)

## 2023-04-02 ENCOUNTER — Telehealth: Payer: Self-pay | Admitting: *Deleted

## 2023-04-02 NOTE — Telephone Encounter (Signed)
Patient received 2 ZIO AT monitors.  Reviewed serial #'s and instructed patient to return serial # [id number] to [employer].  I will contact [employer] and let them know this monitor was not registered to the patient and sent in error.  ZIO AT serial # [id number] is the ZIO AT monitor registered to the patient. Informed patient not to apply the monitor until after she completed her CT.  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  [employer] does not offer a sensitive skin alternative.  Patient was scheduled to have her ZIO AT monitor applied after her CT on 04/08/23 using tincture of benzoin as a barrier between the adhesive and her skin.  Hopefully this will help with adhesive sensitivity.

## 2023-04-07 ENCOUNTER — Telehealth (HOSPITAL_COMMUNITY): Payer: Self-pay | Admitting: *Deleted

## 2023-04-07 NOTE — Telephone Encounter (Signed)
 Received call from patient regarding upcoming cardiac imaging study; pt verbalizes understanding of appt date/time, parking situation and where to check in, pre-test NPO status and medications ordered, and verified current allergies; name and call back number provided for further questions should they arise Hailey Frame RN Navigator Cardiac Imaging [facility] [phone] office [phone] cell

## 2023-04-07 NOTE — Telephone Encounter (Signed)
Attempted to call patient regarding upcoming cardiac CT appointment. Left message on voicemail with name and callback number Hayley Sharpe RN Navigator Cardiac Imaging [facility] [phone] Office   

## 2023-04-08 ENCOUNTER — Ambulatory Visit (HOSPITAL_COMMUNITY)
Admission: RE | Admit: 2023-04-08 | Discharge: 2023-04-08 | Disposition: A | Discharge status: Home / Self Care | Payer: Medicare PPO | Source: Ambulatory Visit | Attending: Physician Assistant | Admitting: Physician Assistant

## 2023-04-08 ENCOUNTER — Ambulatory Visit: Payer: Medicare PPO

## 2023-04-08 DIAGNOSIS — I2510 Atherosclerotic heart disease of native coronary artery without angina pectoris: Secondary | ICD-10-CM | POA: Insufficient documentation

## 2023-04-08 DIAGNOSIS — R55 Syncope and collapse: Secondary | ICD-10-CM | POA: Diagnosis not present

## 2023-04-08 DIAGNOSIS — R072 Precordial pain: Secondary | ICD-10-CM

## 2023-04-08 DIAGNOSIS — R079 Chest pain, unspecified: Secondary | ICD-10-CM | POA: Insufficient documentation

## 2023-04-08 MED ORDER — DILTIAZEM HCL 25 MG/5ML IV SOLN
INTRAVENOUS | Status: AC
  Filled 2023-04-08: qty 5

## 2023-04-08 MED ORDER — IOHEXOL 350 MG/ML SOLN
190.0000 mL | Freq: Once | INTRAVENOUS | Status: AC | PRN
  Administered 2023-04-08: 190 mL via INTRAVENOUS

## 2023-04-08 MED ORDER — NITROGLYCERIN 0.4 MG SL SUBL
0.8000 mg | SUBLINGUAL_TABLET | Freq: Once | SUBLINGUAL | Status: AC
  Administered 2023-04-08: 0.8 mg via SUBLINGUAL

## 2023-04-08 MED ORDER — NITROGLYCERIN 0.4 MG SL SUBL
SUBLINGUAL_TABLET | SUBLINGUAL | Status: AC
  Filled 2023-04-08: qty 2

## 2023-04-08 MED ORDER — DILTIAZEM HCL 25 MG/5ML IV SOLN
5.0000 mg | Freq: Once | INTRAVENOUS | Status: AC
  Administered 2023-04-08: 5 mg via INTRAVENOUS

## 2023-04-09 DIAGNOSIS — R55 Syncope and collapse: Secondary | ICD-10-CM | POA: Diagnosis not present

## 2023-04-13 ENCOUNTER — Telehealth: Payer: Self-pay | Admitting: Cardiology

## 2023-04-13 NOTE — Telephone Encounter (Signed)
Notified by [employer] that patient had first documented episode of atrial flutter. At 6:19 PM today, patient went into atrial flutter with HR ranging from 75-103 BPM. There was also atrial tachycardia present in the recording. Patient was contacted by [facility] and she was asymptomatic during the event. Follow up tracing at 7:24 PM showed that patient was back in normal sinus rhythm.   Per chart review, patient has a known history of atrial fib and atrial tachycardia. Is on eliquis 5 mg BID which is the proper dose for her   Jonita Albee, Cordelia Poche 04/13/2023 7:38 PM

## 2023-04-17 ENCOUNTER — Ambulatory Visit (HOSPITAL_COMMUNITY): Payer: Medicare PPO | Attending: Physician Assistant

## 2023-04-17 DIAGNOSIS — R079 Chest pain, unspecified: Secondary | ICD-10-CM | POA: Insufficient documentation

## 2023-04-17 LAB — ECHOCARDIOGRAM COMPLETE
Area-P 1/2: 4.39 cm2
MV M vel: 4.82 m/s
MV Peak grad: 92.9 mmHg
P 1/2 time: 391 ms
S' Lateral: 2.9 cm

## 2023-04-22 DIAGNOSIS — H52223 Regular astigmatism, bilateral: Secondary | ICD-10-CM | POA: Diagnosis not present

## 2023-04-22 DIAGNOSIS — H3530 Unspecified macular degeneration: Secondary | ICD-10-CM | POA: Diagnosis not present

## 2023-04-22 DIAGNOSIS — H353131 Nonexudative age-related macular degeneration, bilateral, early dry stage: Secondary | ICD-10-CM | POA: Diagnosis not present

## 2023-04-22 DIAGNOSIS — H524 Presbyopia: Secondary | ICD-10-CM | POA: Diagnosis not present

## 2023-04-22 DIAGNOSIS — H5203 Hypermetropia, bilateral: Secondary | ICD-10-CM | POA: Diagnosis not present

## 2023-04-23 DIAGNOSIS — L814 Other melanin hyperpigmentation: Secondary | ICD-10-CM | POA: Diagnosis not present

## 2023-04-23 DIAGNOSIS — D044 Carcinoma in situ of skin of scalp and neck: Secondary | ICD-10-CM | POA: Diagnosis not present

## 2023-04-23 DIAGNOSIS — L821 Other seborrheic keratosis: Secondary | ICD-10-CM | POA: Diagnosis not present

## 2023-04-23 DIAGNOSIS — D225 Melanocytic nevi of trunk: Secondary | ICD-10-CM | POA: Diagnosis not present

## 2023-04-23 DIAGNOSIS — Z85828 Personal history of other malignant neoplasm of skin: Secondary | ICD-10-CM | POA: Diagnosis not present

## 2023-04-23 DIAGNOSIS — L570 Actinic keratosis: Secondary | ICD-10-CM | POA: Diagnosis not present

## 2023-05-13 ENCOUNTER — Ambulatory Visit: Payer: Medicare PPO | Admitting: Physician Assistant

## 2023-05-13 DIAGNOSIS — M47816 Spondylosis without myelopathy or radiculopathy, lumbar region: Secondary | ICD-10-CM | POA: Diagnosis not present

## 2023-05-13 DIAGNOSIS — M1711 Unilateral primary osteoarthritis, right knee: Secondary | ICD-10-CM | POA: Diagnosis not present

## 2023-05-13 NOTE — Progress Notes (Unsigned)
Cardiology Office Note Date:  05/13/2023  Patient ID:  Hailey [name], Hailey [name] [birth year], MRN [id number] PCP:  Richardean Chimera, MD  Electrophysiologist: Dr. Elberta Fortis    Chief Complaint:  s/p ER visit  History of Present Illness: Hailey [name] is a 82 y.o. female with history of AFib, RBBB, HTN, HLD, CKD (IIIb)  She saw Dr. Elberta Fortis 01/28/23, mentioned a cough/hoarse voice she attributed to GERD, though had developed some CP, noting her pre-ablation CT nted low Ca burden, she was concerned about th symptoms, sounded atypical , planned for coronary CTa.  CTa not done  03/08/23: ER visit with c/o CP, reported intermittent for a month, woke with cp persisted and went for evaluation HS Trop neg x2 Labs unremarkable with some renal insufficiency noted not far from her baseline. EKG was SR w/RBBB  Saw GI 03/13/23: she also reported reduced exertional capacity over the last year getting winded earier.  Not suspected to be GI necessarily (with negative w/u in the past), have tried to escalate her PPI/acid reduction rx's though she has been intolerant.  Omeprazole continued, rec to f/u with cardiology. Recent URI/coughing, perhaps chest walletiology  I saw her 03/31/23 The time line of her symptoms: Dec 2023, she developed an unusual URI-like couple days, a day of a non-productive cough followed by wheezing Treated for URI/bronchitis with abx, inhaler, dose pack  Sometime about march or so, started on PRN lasix for new ankle/LE edema, xr 20mg  daily PRN, rarely has taken 10mg   July 4th was working outside  when she turned to go do something and had a sudden and very sharp jolt of pain in her chest, went and laid down and seemed to settle away, since about then has had a central CP Non-radiating, occurs most days, less aware of it when active/busy.  All along this has felt like she has had a slow down in general, fatigues easier, gets winded easier, though is able to get through her ADLs. The edema  continues, waxes/wanes  She is also having some weak spells, perhaps more so upon standing, but even after she is up and around feels like she is lightheaded, weak, on a few occasions had to sit down because she though she may faint  No palpitations Doesn't think she ha had and overt Afib at least No bleeding or signs of bleeding  Planned to pursue coronary CT never got done previously, update her echo and get a monitor   -- Tracings associated with her symptoms were normal rhythm and rate Did have some AFlutter/ATach/ though again heart rate controlled and was <1% of the time, and not associated with symptoms -- echo looked good, preserved LVEF, no VHD -- coronary CT w/mild non-obstructive CAD, negative radiology over read  TODAY Remains with DOE, this is worrisome and bothersome to her Not progressive, though not better either No CP, palpitations No near syncope or syncope No bleeding or signs of bleeding   Device information PVI ablation 08/20/2018 PVI ablation 05/11/2019 (arrived to the procedure in an ATach) Flecainide started Feb 2020  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   Migraine headache    Palpitations    Restless leg syndrome    Vaginal atrophy     Past Surgical History:  Procedure Laterality  Date   APPENDECTOMY     ATRIAL FIBRILLATION ABLATION N/A 08/20/2018   Procedure: ATRIAL FIBRILLATION ABLATION;  Surgeon: Regan Lemming, MD;  Location: [facility] INVASIVE CV LAB;  Service: Cardiovascular;  Laterality: N/A;   ATRIAL FIBRILLATION ABLATION N/A 05/11/2019   Procedure: ATRIAL FIBRILLATION ABLATION;  Surgeon: Regan Lemming, MD;  Location: [facility] INVASIVE CV LAB;  Service: Cardiovascular;  Laterality: N/A;   BIOPSY  06/06/2020   Procedure: BIOPSY;  Surgeon: Corbin Ade, MD;  Location: [facility] ENDO  SUITE;  Service: Endoscopy;;  gastric   CARDIAC CATHETERIZATION  08/05/2003   cone   CARDIOVERSION N/A 05/02/2019   Procedure: CARDIOVERSION;  Surgeon: Jodelle Red, MD;  Location: [facility] ENDOSCOPY;  Service: Cardiovascular;  Laterality: N/A;   COLONOSCOPY     ESOPHAGOGASTRODUODENOSCOPY N/A 06/06/2020   benign gastric polyps and normal esophagus status post dilation, normal duodenum   HERNIA REPAIR     LEFT HEART CATHETERIZATION WITH CORONARY ANGIOGRAM N/A 04/12/2012   Procedure: LEFT HEART CATHETERIZATION WITH CORONARY ANGIOGRAM;  Surgeon: Kathleene Hazel, MD;  Location: [facility] CATH LAB;  Service: Cardiovascular;  Laterality: N/A;   MALONEY DILATION N/A 06/06/2020   Procedure: [name] DILATION;  Surgeon: Corbin Ade, MD;  Location: [facility] ENDO SUITE;  Service: Endoscopy;  Laterality: N/A;   POLYPECTOMY  06/06/2020   Procedure: POLYPECTOMY;  Surgeon: Corbin Ade, MD;  Location: [facility] ENDO SUITE;  Service: Endoscopy;;  gastric   TOTAL ABDOMINAL HYSTERECTOMY      Current Outpatient Medications  Medication Sig Dispense Refill   acetaminophen (TYLENOL) 650 MG CR tablet Take 1,300 mg by mouth 2 (two) times daily.     aluminum-magnesium hydroxide 200-200 MG/5ML suspension Take by mouth every 6 (six) hours as needed for indigestion.     apixaban (ELIQUIS) 5 MG TABS tablet Take 5 mg by mouth 2 (two) times daily.     Calcium Carb-Cholecalciferol (CALCIUM 600+D3 PO) Take 1 tablet by mouth daily.      calcium carbonate (TUMS - DOSED IN MG ELEMENTAL CALCIUM) 500 MG chewable tablet Chew 1 tablet by mouth as needed for indigestion or heartburn.     diclofenac Sodium (VOLTAREN) 1 % GEL Apply topically as directed.     diltiazem (CARDIZEM CD) 120 MG 24 hr capsule Take 1 capsule (120 mg total) by mouth daily. 90 capsule 3   flecainide (TAMBOCOR) 50 MG tablet TAKE 1 TABLET BY MOUTH TWICE A DAY 180 tablet 3   fluticasone (FLONASE) 50 MCG/ACT nasal spray Place 2 sprays into both nostrils daily.      furosemide (LASIX) 20 MG tablet Take 20 mg by mouth daily as needed for fluid or edema. Pt only takes 1/2 tablet     gabapentin (NEURONTIN) 100 MG capsule Take 100 mg by mouth at bedtime.     nystatin cream (MYCOSTATIN) Apply 1 Application topically 3 (three) times daily.     omeprazole (PRILOSEC) 20 MG capsule Take 20 mg by mouth 2 (two) times daily before a meal.     OVER THE COUNTER MEDICATION Take 1 capsule by mouth daily. Focus Select Areds2 for macular degeneration     sucralfate (CARAFATE) 1 g tablet Take 1 tablet (1 g total) by mouth 4 (four) times daily -  before meals and at bedtime. 120 tablet 3   No current facility-administered medications for this visit.    Allergies:   Amiodarone, Dexilant [dexlansoprazole], Flomax [tamsulosin hcl], Nexium [esomeprazole], and Pantoprazole sodium   Social History:  The patient  reports that  she has never smoked. She has never used smokeless tobacco. She reports that she does not currently use alcohol. She reports that she does not use drugs.   Family History:  The patient's family history includes Coronary artery disease in her father; Heart attack in her brother; Heart disease in her brother; Hypertension in her brother and mother; Other in her brother, sister, and sister; Stroke in her brother and brother; Transient ischemic attack in her brother.  ROS:  Please see the history of present illness.    All other systems are reviewed and otherwise negative.   PHYSICAL EXAM:  VS:  There were no vitals taken for this visit. BMI: There is no height or weight on file to calculate BMI. Well nourished, well developed, in no acute distress HEENT: normocephalic, atraumatic Neck: no JVD, carotid bruits or masses Cardiac:  RRR; no significant murmurs, no rubs, or gallops Lungs: CTA b/l, no wheezing, rhonchi or rales Abd: soft, nontender MS: no deformity or atrophy Ext: no edema Skin: warm and dry, no rash Neuro:  No gross deficits  appreciated Psych: euthymic mood, full affect    EKG:  Done today and reviewed by myself shows  SR 1st degree AVBlock , RBBB  SR 79bpm, 1st degree AVBlock , RBBB ( ) PR wobbles, was in 2020 Pre-flecainide  PR , RBBB > in 2020  04/08/23: Coronary CT IMPRESSION: 1. Coronary calcium score of 99. This was 44th percentile for age and sex matched control.   2. Total plaque volume 221mm3 which is 31st percentile for age and sex-matched controls (calcified plaque 43mm3; noncalcified plaque 283mm3). TPV is moderate   3.  Normal coronary origin with right dominance.   4.  Nonobstructive CAD   5.  Mixed plaque in proximal LAD causes mild (25-49%) stenosis   6.  Minimal (0-24%) stenosis in mid LAD and proximal RCA   CAD-RADS 2. Mild non-obstructive CAD (25-49%). Consider non-atherosclerotic causes of chest pain. Consider preventive therapy and risk factor modification.  04/17/23: TTE 1. Left ventricular ejection fraction, by estimation, is 60 to 65%. The  left ventricle has normal function. The left ventricle has no regional  wall motion abnormalities. Left ventricular diastolic function could not  be evaluated.   2. Right ventricular systolic function is low normal. The right  ventricular size is normal. Tricuspid regurgitation signal is inadequate  for assessing PA pressure.   3. The mitral valve is grossly normal. Mild mitral valve regurgitation.   4. The aortic valve is tricuspid. Aortic valve regurgitation is mild.  Aortic valve sclerosis/calcification is present, without any evidence of  aortic stenosis.   5. The inferior vena cava is normal in size with greater than 50%  respiratory variability, suggesting right atrial pressure of 3 mmHg.   Sept 2024: monitor Patient had a min HR of 46 bpm, max HR of 125 bpm, and avg HR of 68 bpm.  Predominant underlying rhythm was Sinus Rhythm.  Atrial Flutter versus atrial tachycardia occurred  (<1% burden), heart rate 64-125 bpm (avg of 94 bpm), longest 37 mins 35 secs, avg rate of 94 bpm.  Less than 1% ventricular and supraventricular ectopy Patient triggered episodes associated with sinus rhythm   05/19/2021: TTE 1. Left ventricular ejection fraction, by estimation, is 60 to 65%. The  left ventricle has normal function. The left ventricle has no regional  wall motion abnormalities. There is mild concentric left ventricular  hypertrophy. Left ventricular diastolic  parameters are consistent with Grade II  diastolic dysfunction  (pseudonormalization).   2. Right ventricular systolic function is normal. The right ventricular  size is normal.   3. The mitral valve is normal in structure. Mild mitral valve  regurgitation. No evidence of mitral stenosis.   4. The aortic valve is normal in structure. Aortic valve regurgitation is  mild to moderate. No aortic stenosis is present.   5. The inferior vena cava is normal in size with greater than 50%  respiratory variability, suggesting right atrial pressure of 3 mmHg.     05/11/2019: EPS/ablation CONCLUSIONS: 1. Atrial tachycardia upon presentation.   2. Successful electrical isolation and anatomical encircling of the left pulmonary veins with radiofrequency current.  A WACA approach was used 3. Additional left atrial ablation was performed along the roof of the left atrium 4. No early apparent complications.  08/20/2018: EPS/ablation CONCLUSIONS: 1. Sinus rhythm upon presentation.   2. Successful electrical isolation and anatomical encircling of all four pulmonary veins with radiofrequency current.    3. Cavo-tricuspid isthmus ablation was performed with complete bidirectional isthmus block achieved.  4. No inducible arrhythmias following ablation both on and off of dobutamine 5. No early apparent complications.   05/04/2019: cardiac CT IMPRESSION: 1. Calcium score 40 limited to LAD this is only 39 % for age and sex. Non  obstructive 1-24% calcific plaque in proximal LAD 2.  Severe RAE and moderate LAE No LAA thrombus 3.  Normal aortic root 3.2 cm 4.  Normal PV anatomy including small right middle PV 5.  No pericardial effusion IMPRESSION: Cardiomegaly.  No acute extra cardiac abnormality.   Recent Labs: 03/08/2023: ALT 15; Hemoglobin 12.9; Magnesium 2.3; Platelets 273 03/31/2023: BUN 16; Creatinine, Ser 1.26; Potassium 4.1; Sodium 144  No results found for requested labs within last 365 days.   CrCl cannot be calculated (Patient's most recent lab result is older than the maximum 21 days allowed.).   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 Elberta Fortis  ADDEND: 05/14/23 Dr. 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 DOE No rest or nocturnal symptoms No exam findings to suggest volume OL Discussed I do not suspect cardiac etiology, consider pulmonary evaluation, she will discuss with her PMD Discussed starting to exercise and try to improve conditioning and weight loss Discussed possibly HFpEF, discussed possibly low dose daily diuretic She does have some baseline CKD, will get BMET to guide this decision She would like to try low dose lasix if able    Disposition: back in 78mo, sooner if needed  Current  medicines are reviewed at length with the patient today.  The patient did not have any concerns regarding medicines.  Norma Fredrickson, PA-C 05/13/2023 7:13 AM     [facility] [address] [place] [address] [phone] (office)  [phone] (fax)

## 2023-05-14 ENCOUNTER — Encounter: Payer: Self-pay | Admitting: Physician Assistant

## 2023-05-14 ENCOUNTER — Ambulatory Visit: Payer: Medicare PPO | Attending: Physician Assistant | Admitting: Physician Assistant

## 2023-05-14 VITALS — BP 140/90 | HR 66 | Ht 67.0 in | Wt 196.0 lb

## 2023-05-14 DIAGNOSIS — R0602 Shortness of breath: Secondary | ICD-10-CM

## 2023-05-14 DIAGNOSIS — I480 Paroxysmal atrial fibrillation: Secondary | ICD-10-CM | POA: Diagnosis not present

## 2023-05-14 DIAGNOSIS — D6869 Other thrombophilia: Secondary | ICD-10-CM | POA: Diagnosis not present

## 2023-05-14 DIAGNOSIS — I10 Essential (primary) hypertension: Secondary | ICD-10-CM | POA: Diagnosis not present

## 2023-05-14 DIAGNOSIS — Z79899 Other long term (current) drug therapy: Secondary | ICD-10-CM

## 2023-05-14 LAB — BASIC METABOLIC PANEL
BUN/Creatinine Ratio: 17 (ref 12–28)
BUN: 23 mg/dL (ref 8–27)
CO2: 24 mmol/L (ref 20–29)
Calcium: 10 mg/dL (ref 8.7–10.3)
Chloride: 104 mmol/L (ref 96–106)
Creatinine, Ser: 1.37 mg/dL — ABNORMAL HIGH (ref 0.57–1.00)
Glucose: 86 mg/dL (ref 70–99)
Potassium: 5 mmol/L (ref 3.5–5.2)
Sodium: 141 mmol/L (ref 134–144)
eGFR: 39 mL/min/{1.73_m2} — ABNORMAL LOW (ref >59)

## 2023-05-14 NOTE — Patient Instructions (Addendum)
Medication Instructions:   *If you need a refill on your cardiac medications before your next appointment, please call your pharmacy*   Lab Work: BMET TODAY   If you have labs (blood work) drawn today and your tests are completely normal, you will receive your results only by: MyChart Message (if you have MyChart) OR A paper copy in the mail If you have any lab test that is abnormal or we need to change your treatment, we will call you to review the results.   Testing/Procedures: NONE ORDERED  TODAY    Follow-Up: At [facility], you and your health needs are our priority.  As part of our continuing mission to provide you with exceptional heart care, we have created designated Provider Care Teams.  These Care Teams include your primary Cardiologist (physician) and Advanced Practice Providers (APPs -  Physician Assistants and Nurse Practitioners) who all work together to provide you with the care you need, when you need it.  We recommend signing up for the patient portal called "MyChart".  Sign up information is provided on this After Visit Summary.  MyChart is used to connect with patients for Virtual Visits (Telemedicine).  Patients are able to view lab/test results, encounter notes, upcoming appointments, etc.  Non-urgent messages can be sent to your provider as well.   To learn more about what you can do with MyChart, go to [email or web address].    Your next appointment:   4 month(s)  ( CONTACT  CASSIE HALL/ ANGELINE HAMMER FOR EP SCHEDULING ISSUES )   Provider:   You may see Will Jorja Loa, MD or one of the following Advanced Practice Providers on your designated Care Team:   Francis Dowse, [place]  Other Instructions

## 2023-05-18 ENCOUNTER — Emergency Department (HOSPITAL_COMMUNITY): Payer: Medicare PPO

## 2023-05-18 ENCOUNTER — Emergency Department (HOSPITAL_COMMUNITY)
Admission: EM | Admit: 2023-05-18 | Discharge: 2023-05-18 | Disposition: A | Discharge status: Home / Self Care | Payer: Medicare PPO | Attending: Emergency Medicine | Admitting: Emergency Medicine

## 2023-05-18 ENCOUNTER — Other Ambulatory Visit: Payer: Self-pay

## 2023-05-18 ENCOUNTER — Encounter (HOSPITAL_COMMUNITY): Payer: Self-pay | Admitting: *Deleted

## 2023-05-18 ENCOUNTER — Encounter: Payer: Self-pay | Admitting: Internal Medicine

## 2023-05-18 DIAGNOSIS — W01198A Fall on same level from slipping, tripping and stumbling with subsequent striking against other object, initial encounter: Secondary | ICD-10-CM | POA: Insufficient documentation

## 2023-05-18 DIAGNOSIS — M19011 Primary osteoarthritis, right shoulder: Secondary | ICD-10-CM | POA: Diagnosis not present

## 2023-05-18 DIAGNOSIS — S0083XA Contusion of other part of head, initial encounter: Secondary | ICD-10-CM | POA: Diagnosis not present

## 2023-05-18 DIAGNOSIS — I2510 Atherosclerotic heart disease of native coronary artery without angina pectoris: Secondary | ICD-10-CM | POA: Insufficient documentation

## 2023-05-18 DIAGNOSIS — M4182 Other forms of scoliosis, cervical region: Secondary | ICD-10-CM | POA: Diagnosis not present

## 2023-05-18 DIAGNOSIS — W19XXXA Unspecified fall, initial encounter: Secondary | ICD-10-CM

## 2023-05-18 DIAGNOSIS — N183 Chronic kidney disease, stage 3 unspecified: Secondary | ICD-10-CM | POA: Diagnosis not present

## 2023-05-18 DIAGNOSIS — M4312 Spondylolisthesis, cervical region: Secondary | ICD-10-CM | POA: Diagnosis not present

## 2023-05-18 DIAGNOSIS — I10 Essential (primary) hypertension: Secondary | ICD-10-CM | POA: Diagnosis not present

## 2023-05-18 DIAGNOSIS — I340 Nonrheumatic mitral (valve) insufficiency: Secondary | ICD-10-CM | POA: Diagnosis not present

## 2023-05-18 DIAGNOSIS — R519 Headache, unspecified: Secondary | ICD-10-CM | POA: Diagnosis not present

## 2023-05-18 DIAGNOSIS — S0990XA Unspecified injury of head, initial encounter: Secondary | ICD-10-CM | POA: Insufficient documentation

## 2023-05-18 DIAGNOSIS — S0993XA Unspecified injury of face, initial encounter: Secondary | ICD-10-CM | POA: Diagnosis not present

## 2023-05-18 DIAGNOSIS — M542 Cervicalgia: Secondary | ICD-10-CM | POA: Diagnosis not present

## 2023-05-18 DIAGNOSIS — E7849 Other hyperlipidemia: Secondary | ICD-10-CM | POA: Diagnosis not present

## 2023-05-18 DIAGNOSIS — I6529 Occlusion and stenosis of unspecified carotid artery: Secondary | ICD-10-CM | POA: Diagnosis not present

## 2023-05-18 DIAGNOSIS — Z1329 Encounter for screening for other suspected endocrine disorder: Secondary | ICD-10-CM | POA: Diagnosis not present

## 2023-05-18 DIAGNOSIS — M25511 Pain in right shoulder: Secondary | ICD-10-CM | POA: Diagnosis not present

## 2023-05-18 DIAGNOSIS — E042 Nontoxic multinodular goiter: Secondary | ICD-10-CM | POA: Diagnosis not present

## 2023-05-18 NOTE — ED Provider Notes (Signed)
[place] EMERGENCY DEPARTMENT AT [facility] Provider Note  CSN: [id number] Arrival date & time: 05/18/23 4782  Chief Complaint(s) Fall  HPI Hailey [name] is a 82 y.o. female with past medical history as below, significant for atrial fibrillation on DOAC, hypertension, Mnire's disease, migraine headaches, RLS who presents to the ED with complaint of fall, head injury  She reports she was going down steps to her house, the banister was wet from do and she slipped.  Fell onto her right side, hit her head on the step/ground and right shoulder.  No LOC, she is on DOAC for A-fib.  She was helped up from the ground by family.  Has been ambulatory since then.  She has pain to right orbit and right shoulder otherwise no discomfort.  She has been ambulatory with her typical gait since the fall.  No chest pain or dyspnea, nausea or vomiting.  Last dose of Eliquis this morning    Past Medical History 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   Migraine headache    Palpitations    Restless leg syndrome    Vaginal atrophy    Patient Active Problem List   Diagnosis Date Noted   Diarrhea 01/01/2021   Chest pain 10/16/2020   Abnormal mammogram of right breast 08/27/2020   Dysphagia 04/17/2020   GERD (gastroesophageal reflux disease) 11/29/2019   Abdominal pain 11/29/2019   Acquired thrombophilia (HCC) 06/22/2019   Persistent atrial fibrillation (HCC) 05/11/2019   Atrial flutter (HCC)    Atrial fibrillation with RVR (HCC) 05/01/2019   Essential hypertension    Atrial fibrillation (HCC) 07/14/2018   Vaginal dryness 01/28/2017   Vaginal atrophy 01/28/2017   Breast nodule 11/16/2013   Breast mass, right 12/22/2012   Overweight 04/13/2009   GENERALIZED ANXIETY DISORDER 04/13/2009   Coronary artery disease involving native  heart without angina pectoris 04/13/2009   PALPITATIONS 04/03/2009   Home Medication(s) Prior to Admission medications   Medication Sig Start Date End Date Taking? Authorizing Provider  acetaminophen (TYLENOL) 650 MG CR tablet Take 1,300 mg by mouth 2 (two) times daily.    [provider]  aluminum-magnesium hydroxide 200-200 MG/5ML suspension Take by mouth every 6 (six) hours as needed for indigestion.    [provider]  apixaban (ELIQUIS) 5 MG TABS tablet Take 5 mg by mouth 2 (two) times daily.    [provider]  Calcium Carb-Cholecalciferol (CALCIUM 600+D3 PO) Take 1 tablet by mouth daily.     [provider]  calcium carbonate (TUMS - DOSED IN MG ELEMENTAL CALCIUM) 500 MG chewable tablet Chew 1 tablet by mouth as needed for indigestion or heartburn.    [provider]  diclofenac Sodium (VOLTAREN) 1 % GEL Apply topically as directed.    [provider]  diltiazem (CARDIZEM CD) 120 MG 24 hr capsule Take 1 capsule (120 mg total) by mouth daily. 01/30/23   Camnitz, Andree Coss, MD  flecainide (TAMBOCOR) 50 MG tablet TAKE 1 TABLET BY MOUTH TWICE A DAY 01/23/23   Camnitz, Will Daphine Deutscher, MD  fluticasone [facility]) 50 MCG/ACT nasal spray Place 2 sprays into both nostrils daily. 09/05/22   [provider]  furosemide (LASIX) 20 MG tablet Take 20 mg by mouth daily as needed for fluid or edema. Pt only  takes 1/2 tablet 01/20/23   [provider]  gabapentin (NEURONTIN) 100 MG capsule Take 100 mg by mouth at bedtime.    [provider]  nystatin cream (MYCOSTATIN) Apply 1 Application topically 3 (three) times daily. 11/28/22   [provider]  omeprazole (PRILOSEC) 20 MG capsule Take 20 mg by mouth 2 (two) times daily before a meal.    [provider]  OVER THE COUNTER MEDICATION Take 1 capsule by mouth daily. Focus Select Areds2 for macular degeneration    [provider]  sucralfate (CARAFATE) 1 g  tablet Take 1 tablet (1 g total) by mouth 4 (four) times daily -  before meals and at bedtime. 03/13/23   Corbin Ade, MD                                                                                                                                    Past Surgical History Past Surgical History:  Procedure Laterality Date   APPENDECTOMY     ATRIAL FIBRILLATION ABLATION N/A 08/20/2018   Procedure: ATRIAL FIBRILLATION ABLATION;  Surgeon: Regan Lemming, MD;  Location: [facility] INVASIVE CV LAB;  Service: Cardiovascular;  Laterality: N/A;   ATRIAL FIBRILLATION ABLATION N/A 05/11/2019   Procedure: ATRIAL FIBRILLATION ABLATION;  Surgeon: Regan Lemming, MD;  Location: [facility] INVASIVE CV LAB;  Service: Cardiovascular;  Laterality: N/A;   BIOPSY  06/06/2020   Procedure: BIOPSY;  Surgeon: Corbin Ade, MD;  Location: [facility] ENDO SUITE;  Service: Endoscopy;;  gastric   CARDIAC CATHETERIZATION  08/05/2003   cone   CARDIOVERSION N/A 05/02/2019   Procedure: CARDIOVERSION;  Surgeon: Jodelle Red, MD;  Location: [facility] ENDOSCOPY;  Service: Cardiovascular;  Laterality: N/A;   COLONOSCOPY     ESOPHAGOGASTRODUODENOSCOPY N/A 06/06/2020   benign gastric polyps and normal esophagus status post dilation, normal duodenum   HERNIA REPAIR     LEFT HEART CATHETERIZATION WITH CORONARY ANGIOGRAM N/A 04/12/2012   Procedure: LEFT HEART CATHETERIZATION WITH CORONARY ANGIOGRAM;  Surgeon: Kathleene Hazel, MD;  Location: [facility];  Service: Cardiovascular;  Laterality: N/A;   MALONEY DILATION N/A 06/06/2020   Procedure: [name] DILATION;  Surgeon: Corbin Ade, MD;  Location: [facility] ENDO [facility];  Service: Endoscopy;  Laterality: N/A;   POLYPECTOMY  06/06/2020   Procedure: POLYPECTOMY;  Surgeon: Corbin Ade, MD;  Location: [facility] END[facility];  Service: Endoscopy;;  gastric   TOTAL ABDOMINAL HYSTERECTOMY     Family History Family History  Problem Relation Age of Onset   Coronary artery disease Father         mid 64s   Hypertension Mother    Stroke Brother    Transient ischemic attack Brother    Other Brother        on oxygen   Hypertension Brother    Stroke Brother    Heart disease Brother    Heart attack Brother    Other  Sister        a fib, restless leg   Other Sister        pressure in eyes   Colon cancer Neg Hx    Gastric cancer Neg Hx    Esophageal cancer Neg Hx     Social History Social History   Tobacco Use   Smoking status: Never   Smokeless tobacco: Never  Vaping Use   Vaping status: Never Used  Substance Use Topics   Alcohol use: Not Currently    Comment: rarely; glass of wine with dinner   Drug use: No   Allergies Amiodarone, Dexilant [dexlansoprazole], Flomax [tamsulosin hcl], Nexium [esomeprazole], and Pantoprazole sodium  Review of Systems Review of Systems  Constitutional:  Negative for chills and fever.  Respiratory:  Negative for chest tightness and shortness of breath.   Cardiovascular:  Negative for chest pain.  Gastrointestinal:  Negative for abdominal pain, nausea and vomiting.  Genitourinary:  Negative for dysuria.  Skin:  Positive for wound.  Neurological:  Negative for syncope and headaches.  All other systems reviewed and are negative.   Physical Exam Vital Signs  I have reviewed the triage vital signs BP 131/60 (BP Location: Right Arm)   Pulse 74   Temp 97.9 F (36.6 C) (Oral)   Resp 14   Ht 5\' 7"  (1.702 m)   Wt 88.5 kg   SpO2 96%   BMI 30.54 kg/m  Physical Exam Vitals and nursing note reviewed.  Constitutional:      General: She is not in acute distress.    Appearance: Normal appearance.  HENT:     Head: Normocephalic.     Comments: Hematoma right orbit, abrasion right orbit     Right Ear: External ear normal.     Left Ear: External ear normal.     Nose: Nose normal.     Mouth/Throat:     Mouth: Mucous membranes are moist.  Eyes:     General: No scleral icterus.       Right eye: No discharge.        Left eye: No  discharge.     Extraocular Movements: Extraocular movements intact.     Pupils: Pupils are equal, round, and reactive to light.  Cardiovascular:     Rate and Rhythm: Normal rate and regular rhythm.     Pulses: Normal pulses.     Heart sounds: Normal heart sounds.  Pulmonary:     Effort: Pulmonary effort is normal. No respiratory distress.     Breath sounds: Normal breath sounds. No stridor.  Abdominal:     General: Abdomen is flat. There is no distension.     Palpations: Abdomen is soft.     Tenderness: There is no abdominal tenderness.  Musculoskeletal:     Cervical back: No rigidity.     Right lower leg: No edema.     Left lower leg: No edema.     Comments: Pelvis stable to AP pressure  No significant pain to the lower extremities with logroll  LE NVI  Skin:    General: Skin is warm and dry.     Capillary Refill: Capillary refill takes less than 2 seconds.  Neurological:     Mental Status: She is alert and oriented to person, place, and time.     GCS: GCS eye subscore is 4. GCS verbal subscore is 5. GCS motor subscore is 6.     Cranial Nerves: Cranial nerves 2-12 are intact.  Sensory: Sensation is intact.     Motor: Motor function is intact.     Coordination: Coordination is intact.  Psychiatric:        Mood and Affect: Mood normal.        Behavior: Behavior normal. Behavior is cooperative.     ED Results and Treatments Labs (all labs ordered are listed, but only abnormal results are displayed) Labs Reviewed - No data to display                                                                                                                        Radiology CT Cervical Spine Wo Contrast  Result Date: 05/18/2023 CLINICAL DATA:  82 year old female fell down steps.  Pain. EXAM: CT CERVICAL SPINE WITHOUT CONTRAST TECHNIQUE: Multidetector CT imaging of the cervical spine was performed without intravenous contrast. Multiplanar CT image reconstructions were also generated.  RADIATION DOSE REDUCTION: This exam was performed according to the departmental dose-optimization program which includes automated exposure control, adjustment of the mA and/or kV according to patient size and/or use of iterative reconstruction technique. COMPARISON:  Head and face CT today. Cervical spine CT 01/04/2020. Report of thyroid ultrasound 05/02/2021. FINDINGS: Alignment: Chronic straightening of cervical lordosis. Dextroconvex chronic cervical scoliosis. Cervicothoracic junction alignment is within normal limits. Subtle chronic anterolisthesis of C6 on C7. Bilateral posterior element alignment is within normal limits. Skull base and vertebrae: Bone mineralization is within normal limits for age. Visualized skull base is intact. No atlanto-occipital dissociation. C1 and C2 appear intact and aligned. No acute osseous abnormality identified. Soft tissues and spinal canal: No prevertebral fluid or swelling. No visible canal hematoma. Chronic cervical carotid calcified atherosclerosis. Chronic thyroid heterogeneity This has been evaluated on previous imaging. (ref: J Am Coll Radiol. 2015 Feb;12(2): 143-50).Chronic C1-C2 Disc levels: Chronic C1-C2 joint space loss. Chronic cervical spine disc, endplate, and facet degeneration but no significant cervical spinal stenosis by CT. Upper chest: Levoconvex upper thoracic scoliosis, grossly intact visible upper thoracic levels. Negative lung apices, mild respiratory motion. IMPRESSION: 1. No acute traumatic injury identified in the cervical spine. 2. Scoliosis.  Chronic but mild for age cervical spine degeneration. Electronically Signed   By: Odessa Fleming M.D.   On: 05/18/2023 11:48   CT Maxillofacial Wo Contrast  Result Date: 05/18/2023 CLINICAL DATA:  82 year old female fell down steps. Pain. EXAM: CT MAXILLOFACIAL WITHOUT CONTRAST TECHNIQUE: Multidetector CT imaging of the maxillofacial structures was performed. Multiplanar CT image reconstructions were also  generated. RADIATION DOSE REDUCTION: This exam was performed according to the departmental dose-optimization program which includes automated exposure control, adjustment of the mA and/or kV according to patient size and/or use of iterative reconstruction technique. COMPARISON:  Head and cervical spine CT today. FINDINGS: Osseous: Mandible intact and normally located. No acute dental finding. Torus palatinus, normal variant. Bilateral maxilla, zygoma, pterygoid, nasal bones appear intact. Skull base appears intact. Orbits: No orbital wall fracture. Globes and intraorbital soft tissues appear intact, normal. Sinuses: Visualized paranasal sinuses and  mastoids are well aerated. Mild maxillary alveolar recess mucosal thickening mostly on the left. And well aerated. Soft tissues: Hypopharynx motion artifact. Negative other visible noncontrast deep soft tissue spaces of the face. No superficial soft tissue injury identified. Normal visible cervical lymph nodes. Limited intracranial: Stable to that reported separately. IMPRESSION: No acute traumatic injury identified in the Face. Electronically Signed   By: Odessa Fleming M.D.   On: 05/18/2023 11:43   CT Head Wo Contrast  Result Date: 05/18/2023 CLINICAL DATA:  82 year old female fell down steps. Pain. EXAM: CT HEAD WITHOUT CONTRAST TECHNIQUE: Contiguous axial images were obtained from the base of the skull through the vertex without intravenous contrast. RADIATION DOSE REDUCTION: This exam was performed according to the departmental dose-optimization program which includes automated exposure control, adjustment of the mA and/or kV according to patient size and/or use of iterative reconstruction technique. COMPARISON:  Brain MRI 11/24/2003. Head CT 01/04/2020. Face CT reported separately today. FINDINGS: Brain: Cerebral volume is stable, normal for age. No midline shift, ventriculomegaly, mass effect, evidence of mass lesion, intracranial hemorrhage or evidence of cortically  based acute infarction. Aedon Deason-white differentiation is stable and within normal limits. Vascular: No suspicious intracranial vascular hyperdensity. Calcified atherosclerosis at the skull base. Skull: Stable.  No acute fracture identified. Sinuses/Orbits: Visualized paranasal sinuses and mastoids are stable and well aerated. Other: Postoperative changes to the globes since 2021. No acute orbit or scalp soft tissue injury identified. IMPRESSION: 1. No acute traumatic injury identified. 2. Stable and normal for age noncontrast CT appearance of the brain. 3. Face CT reported separately. Electronically Signed   By: Odessa Fleming M.D.   On: 05/18/2023 11:40   DG Shoulder Right  Result Date: 05/18/2023 CLINICAL DATA:  Fall.  Right shoulder pain. EXAM: RIGHT SHOULDER - 2+ VIEW COMPARISON:  None Available. FINDINGS: There is no evidence of fracture or dislocation. Degenerative changes of the glenohumeral and acromioclavicular joints with joint space narrowing and osteophytosis. Soft tissue swelling of the right shoulder is noted. IMPRESSION: No acute fracture or dislocation of the right shoulder. Electronically Signed   By: Hart Robinsons M.D.   On: 05/18/2023 11:08    Pertinent labs & imaging results that were available during my care of the patient were reviewed by me and considered in my medical decision making (see MDM for details).  Medications Ordered in ED Medications - No data to display                                                                                                                                   Procedures Procedures  (including critical care time)  Medical Decision Making / ED Course    Medical Decision Making:    Yesli NOLAH KRENZER is a 82 y.o. female with past medical history as below, significant for atrial fibrillation on DOAC, hypertension, Mnire's disease, migraine headaches, RLS who presents to the ED with complaint of fall, head  injury. The complaint involves an extensive  differential diagnosis and also carries with it a high risk of complications and morbidity.  Serious etiology was considered. Ddx includes but is not limited to: Differential diagnoses for head trauma includes subdural hematoma, epidural hematoma, acute concussion, traumatic subarachnoid hemorrhage, cerebral contusions, etc.   Complete initial physical exam performed, notably the patient  was no acute distress, HDS, evidence of trauma noted to face and head.    Reviewed and confirmed nursing documentation for past medical history, family history, social history.  Vital signs reviewed.    Clinical Course as of 05/18/23 1239  Mon May 18, 2023  1212 CT imaging stable, shoulder x-ray stable.  She is at baseline.  Family at bedside.  Give concussion instructions.  Gait steady.  Stable discharge [SG]    Clinical Course User Index [SG] Sloan Leiter, DO     Fall on thinners, get CT imaging.  No syncope.  Hematoma right orbit, ocular exam is stable  Concussion instructions provided.  She has appoint with her doctor next week  Sister will stay with her next 24 hrs   The patient improved significantly and was discharged in stable condition. Detailed discussions were had with the patient regarding current findings, and need for close f/u with PCP or on call doctor. The patient has been instructed to return immediately if the symptoms worsen in any way for re-evaluation. Patient verbalized understanding and is in agreement with current care plan. All questions answered prior to discharge.               Additional history obtained: -Additional history obtained from na -External records from outside source obtained and reviewed including: Chart review including previous notes, labs, imaging, consultation notes including  Medications, prior ED visit, primary care recommendation   Lab Tests: na  EKG   EKG Interpretation Date/Time:    Ventricular Rate:    PR Interval:    QRS  Duration:    QT Interval:    QTC Calculation:   R Axis:      Text Interpretation:           Imaging Studies ordered: I ordered imaging studies including CT head, face, C-spine I independently visualized the following imaging with scope of interpretation limited to determining acute life threatening conditions related to emergency care; findings noted above I independently visualized and interpreted imaging. I agree with the radiologist interpretation   Medicines ordered and prescription drug management: No orders of the defined types were placed in this encounter.   -I have reviewed the patients home medicines and have made adjustments as needed   Consultations Obtained: na   Cardiac Monitoring: Continuous pulse oximetry interpreted by myself, 99% on RA.    Social Determinants of Health:  Diagnosis or treatment significantly limited by social determinants of health: lives alone   Reevaluation: After the interventions noted above, I reevaluated the patient and found that they have improved  Co morbidities that complicate the patient evaluation  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   Migraine headache    Palpitations    Restless leg syndrome    Vaginal atrophy       Dispostion: Disposition decision including need for hospitalization was considered, and patient discharged from emergency department.    Final  Clinical Impression(s) / ED Diagnoses Final diagnoses:  Minor head injury, initial encounter  Contusion of face, initial encounter  Fall, initial encounter        Sloan Leiter, DO 05/18/23 1239

## 2023-05-18 NOTE — ED Triage Notes (Signed)
Pt reports she slipped going down the steps outside her house, falling down about 3 stairs. C/o soreness to right shoulder, left hip, left ring finger and around right eye. Red discoloration around right eye.  Pt reports taking Eliquis. Denies LOC upon fall.

## 2023-05-18 NOTE — Discharge Instructions (Addendum)
Based on the events which brought you to the ER today, it is possible that you may have a concussion. A concussion occurs when there is a blow to the head or body, with enough force to shake the brain and disrupt how the brain functions. You may experience symptoms such as headaches, sensitivity to light/noise, dizziness, cognitive slowing, difficulty concentrating / remembering, trouble sleeping and drowsiness. These symptoms may last anywhere from hours/days to potentially weeks/months. While these symptoms are very frustrating and perhaps debilitating, it is important that you remember that they will improve over time. Everyone has a different rate of recovery; it is difficult to predict when your symptoms will resolve. In order to allow for your brain to heal after the injury, we recommend that you see your primary physician or a physician knowledgeable in concussion management. We also advise you to let your body and brain rest: avoid physical activities (sports, gym, and exercise) and reduce cognitive demands (reading, texting, TV watching, computer use, video games, etc). School attendance, after-school activities and work may need to be modified to avoid increasing symptoms. We recommend against driving until until all symptoms have resolved. Come back to the ER right away if you are having repeated episodes of vomiting, severe/worsening headache/dizziness or any other symptom that alarms you. We recommended that someone stay with you for the next 24 hours to monitor for these worrisome symptoms.   It was a pleasure caring for you today in the emergency department.  Please return to the emergency department for any worsening or worrisome symptoms.  

## 2023-05-19 ENCOUNTER — Telehealth: Payer: Self-pay

## 2023-05-19 DIAGNOSIS — I4819 Other persistent atrial fibrillation: Secondary | ICD-10-CM

## 2023-05-19 MED ORDER — FUROSEMIDE 20 MG PO TABS: 10.0000 mg | ORAL_TABLET | Freq: Every day | ORAL | 3 refills | Status: DC

## 2023-05-19 NOTE — Telephone Encounter (Signed)
-----   Message from Nurse Pablo Lawrence sent at 05/18/2023  6:12 PM EDT -----  ----- Message ----- From: Sheilah Pigeon, PA-C Sent: 05/15/2023   4:33 PM EDT To: Oleta Mouse, CMA  Labs are stable We discussed starting lasix daily rather then PRN, she preferred 10mg  daily. This is a very low dose, but willing to try. Please start lasix 10mg  daily repeat BMET in 7-10 days No need for any potassium supplement right now

## 2023-05-19 NOTE — Telephone Encounter (Signed)
Spoke with patient, furosemide 10 mg daily sent in to pharmacy and scheduled repeat labs for 10/25 - no further needs at this time

## 2023-05-25 DIAGNOSIS — N1832 Chronic kidney disease, stage 3b: Secondary | ICD-10-CM | POA: Diagnosis not present

## 2023-05-25 DIAGNOSIS — I10 Essential (primary) hypertension: Secondary | ICD-10-CM | POA: Diagnosis not present

## 2023-05-25 DIAGNOSIS — E7849 Other hyperlipidemia: Secondary | ICD-10-CM | POA: Diagnosis not present

## 2023-05-25 DIAGNOSIS — E042 Nontoxic multinodular goiter: Secondary | ICD-10-CM | POA: Diagnosis not present

## 2023-05-25 DIAGNOSIS — I340 Nonrheumatic mitral (valve) insufficiency: Secondary | ICD-10-CM | POA: Diagnosis not present

## 2023-05-25 DIAGNOSIS — Z6829 Body mass index (BMI) 29.0-29.9, adult: Secondary | ICD-10-CM | POA: Diagnosis not present

## 2023-05-25 DIAGNOSIS — J309 Allergic rhinitis, unspecified: Secondary | ICD-10-CM | POA: Diagnosis not present

## 2023-05-25 DIAGNOSIS — I482 Chronic atrial fibrillation, unspecified: Secondary | ICD-10-CM | POA: Diagnosis not present

## 2023-05-25 DIAGNOSIS — G2581 Restless legs syndrome: Secondary | ICD-10-CM | POA: Diagnosis not present

## 2023-05-29 ENCOUNTER — Other Ambulatory Visit: Payer: Self-pay

## 2023-05-29 ENCOUNTER — Ambulatory Visit: Payer: Medicare PPO | Attending: Physician Assistant

## 2023-05-29 DIAGNOSIS — I10 Essential (primary) hypertension: Secondary | ICD-10-CM | POA: Diagnosis not present

## 2023-05-29 DIAGNOSIS — I4819 Other persistent atrial fibrillation: Secondary | ICD-10-CM

## 2023-05-29 LAB — BASIC METABOLIC PANEL
BUN/Creatinine Ratio: 12 (ref 12–28)
BUN: 15 mg/dL (ref 8–27)
CO2: 26 mmol/L (ref 20–29)
Calcium: 8.9 mg/dL (ref 8.7–10.3)
Chloride: 104 mmol/L (ref 96–106)
Creatinine, Ser: 1.21 mg/dL — ABNORMAL HIGH (ref 0.57–1.00)
Glucose: 72 mg/dL (ref 70–99)
Potassium: 4.4 mmol/L (ref 3.5–5.2)
Sodium: 141 mmol/L (ref 134–144)
eGFR: 45 mL/min/{1.73_m2} — ABNORMAL LOW (ref >59)

## 2023-05-29 NOTE — Progress Notes (Signed)
Placed order for BMP due today.  Order placed prior to [employer] change.  Order released and given to lab tech.

## 2023-06-07 ENCOUNTER — Other Ambulatory Visit: Payer: Self-pay | Admitting: Internal Medicine

## 2023-07-06 ENCOUNTER — Encounter: Payer: Self-pay | Admitting: Internal Medicine

## 2023-07-06 ENCOUNTER — Ambulatory Visit: Payer: Medicare PPO | Admitting: Internal Medicine

## 2023-07-06 VITALS — BP 149/82 | HR 67 | Temp 97.5°F | Ht 67.0 in | Wt 193.0 lb

## 2023-07-06 DIAGNOSIS — K219 Gastro-esophageal reflux disease without esophagitis: Secondary | ICD-10-CM

## 2023-07-06 DIAGNOSIS — R0789 Other chest pain: Secondary | ICD-10-CM | POA: Diagnosis not present

## 2023-07-06 NOTE — Patient Instructions (Signed)
It was good to see you again today!  As discussed your chest discomfort seems to be related to musculoskeletal issues rather than something deeper in your chest such as reflux or heart disease.  Recent heart evaluation is reassuring.  You are on a very good regimen to prevent acid reflux.  I recommend continuing omeprazole or Prilosec 20 mg twice daily.  This medication works best if you take it a good 30 minutes before meals.  I would also recommend you take your second dose of Tylenol closer to bedtime rather than at suppertime for arthritis symptoms  Since she had a normal colonoscopy at age 31, I agree with Dr. Reuel Boom, a repeat examination is not indicated.  However, should you develop new bowel symptoms, plans could change.  Unless something comes up, we will plan to see you back in the office in 1 year.

## 2023-07-06 NOTE — Progress Notes (Unsigned)
Primary Care Physician:  Richardean Chimera, MD Primary Gastroenterologist:  Dr. Jena Gauss  Pre-Procedure History & Physical: HPI:  Hailey [name] is a 82 y.o. female here for follow-up of reflux/chest pain.  Saw Dr. Elberta Fortis.  No active cardiac issues.  Recommended exercise and weight loss.  Chest discomfort is intermittent only at night and changes in intensity if she changes sleeping position.  No dysphagia.  No bowel symptoms.  She reports a negative colonoscopy in [place] at age 71; further screening colonoscopy is not warranted.  Patient does not always take omeprazole before meals often takes medication right at mealtime.  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   Migraine headache    Palpitations    Restless leg syndrome    Vaginal atrophy     Past Surgical History:  Procedure Laterality Date   APPENDECTOMY     ATRIAL FIBRILLATION ABLATION N/A 08/20/2018   Procedure: ATRIAL FIBRILLATION ABLATION;  Surgeon: Regan Lemming, MD;  [facility];  Service: Cardiovascular;  Laterality: N/A;   ATRIAL FIBRILLATION ABLATION N/A 05/11/2019   Procedure: ATRIAL FIBRILLATION ABLATION;  Surgeon: Regan Lemming, MD;  [facility]: [facility];  Service: Cardiovascular;  Laterality: N/A;   BIOPSY  06/06/2020   Procedure: BIOPSY;  Surgeon: Corbin Ade, MD;  [facility]: [facility];  Service: Endoscopy;;  gastric   CARDIAC CATHETERIZATION  08/05/2003   cone   CARDIOVERSION N/A 05/02/2019   Procedure: CARDIOVERSION;  Surgeon: Jodelle Red, MD;  Location: [facility] ENDOSCOPY;  Service: Cardiovascular;  Laterality: N/A;   COLONOSCOPY     ESOPHAGOGASTRODUODENOSCOPY N/A 06/06/2020   benign gastric polyps and normal esophagus status post dilation, normal duodenum   HERNIA REPAIR     LEFT HEART  CATHETERIZATION WITH CORONARY ANGIOGRAM N/A 04/12/2012   Procedure: LEFT HEART CATHETERIZATION WITH CORONARY ANGIOGRAM;  Surgeon: Kathleene Hazel, MD;  Location: [facility] CATH LAB;  Service: Cardiovascular;  Laterality: N/A;   MALONEY DILATION N/A 06/06/2020   Procedure: [name] DILATION;  Surgeon: Corbin Ade, MD;  [facility];  Service: Endoscopy;  Laterality: N/A;   POLYPECTOMY  06/06/2020   Procedure: POLYPECTOMY;  Surgeon: Corbin Ade, MD;  [facility];  Service: Endoscopy;;  gastric   TOTAL ABDOMINAL HYSTERECTOMY      Prior to Admission medications   Medication Sig Start Date End Date Taking? Authorizing Provider  acetaminophen (TYLENOL) 650 MG CR tablet Take 1,300 mg by mouth 2 (two) times daily.   Yes [provider]  aluminum-magnesium hydroxide 200-200 MG/5ML suspension Take by mouth every 6 (six) hours as needed for indigestion.   Yes [provider]  apixaban (ELIQUIS) 5 MG TABS tablet Take 5 mg by mouth 2 (two) times daily.   Yes [provider]  Calcium Carb-Cholecalciferol (CALCIUM 600+D3 PO) Take 1 tablet by mouth daily.    Yes [provider]  calcium carbonate (TUMS - DOSED IN MG ELEMENTAL CALCIUM) 500 MG chewable tablet Chew 1 tablet by mouth as needed for indigestion or heartburn.   Yes [provider]  diclofenac Sodium (VOLTAREN) 1 % GEL Apply topically as directed.   Yes [provider]  diltiazem (CARDIZEM CD) 120 MG 24 hr capsule Take 1 capsule (120 mg total) by mouth  daily. 01/30/23  Yes Camnitz, Andree Coss, MD  flecainide (TAMBOCOR) 50 MG tablet TAKE 1 TABLET BY MOUTH TWICE A DAY 01/23/23  Yes Camnitz, Will Daphine Deutscher, MD  fluticasone (FLONASE) 50 MCG/ACT nasal spray Place 2 sprays into both nostrils daily. 09/05/22  Yes [provider]  furosemide (LASIX) 20 MG tablet Take 0.5 tablets (10 mg total) by mouth daily. 05/19/23  Yes Sheilah Pigeon, PA-C  gabapentin (NEURONTIN) 100  MG capsule Take 100 mg by mouth at bedtime.   Yes [provider]  nystatin cream (MYCOSTATIN) Apply 1 Application topically 3 (three) times daily. 11/28/22  Yes [provider]  omeprazole (PRILOSEC) 20 MG capsule Take 20 mg by mouth 2 (two) times daily before a meal.   Yes [provider]  OVER THE COUNTER MEDICATION Take 1 capsule by mouth daily. Focus Select Areds2 for macular degeneration   Yes [provider]    Allergies as of 07/06/2023 - Review Complete 07/06/2023  Allergen Reaction Noted   Amiodarone Itching 04/24/2018   Dexilant [dexlansoprazole] Diarrhea and Other (See Comments) 07/17/2020   Flomax [tamsulosin hcl] Nausea And Vomiting 01/24/2015   Nexium [esomeprazole] Other (See Comments) 01/02/2021   Pantoprazole sodium Other (See Comments) 07/17/2020    Family History  Problem Relation Age of Onset   Coronary artery disease Father        mid 70s   Hypertension Mother    Stroke Brother    Transient ischemic attack Brother    Other Brother        on oxygen   Hypertension Brother    Stroke Brother    Heart disease Brother    Heart attack Brother    Other Sister        a fib, restless leg   Other Sister        pressure in eyes   Colon cancer Neg Hx    Gastric cancer Neg Hx    Esophageal cancer Neg Hx     Social History   Socioeconomic History   Marital status: Single    Spouse name: Not on file   Number of children: 0   Years of education: College   Highest education level: Not on file  Occupational History   Occupation: retired  Tobacco Use   Smoking status: Never   Smokeless tobacco: Never  Vaping Use   Vaping status: Never Used  Substance and Sexual Activity   Alcohol use: Not Currently    Comment: rarely; glass of wine with dinner   Drug use: No   Sexual activity: Not Currently    Birth control/protection: Surgical    Comment: hyst  Other Topics Concern   Not on file  Social History Narrative   Single.  Retired.    Patient lives at home alone.   Caffeine Use: none   Social Determinants of Health   Financial Resource Strain: Low Risk  (10/21/2022)   Received from [facility], [facility]   Overall Financial Resource Strain (CARDIA)    Difficulty of Paying Living Expenses: Not hard at all  Food Insecurity: No Food Insecurity (10/21/2022)   Received from [facility], [facility]   Hunger Vital Sign    Worried About Running Out of Food in the Last Year: Never true    Ran Out of Food in the Last Year: Never true  Transportation Needs: No Transportation Needs (10/21/2022)   Received from [facility], [facility]   [facility] - Transportation    Lack of Transportation (  Medical): No    Lack of Transportation (Non-Medical): No  Physical Activity: Not on file  Stress: Not on file  Social Connections: Unknown (04/24/2022)   Received from [facility], [facility]   Social Network    Social Network: Not on file  Intimate Partner Violence: Unknown (04/24/2022)   Received from [facility], [facility]   HITS    Physically Hurt: Not on file    Insult or Talk Down To: Not on file    Threaten Physical Harm: Not on file    Scream or Curse: Not on file    Review of Systems: See HPI, otherwise negative ROS  Physical Exam: BP (!) 149/82 (BP Location: Left Arm, Patient Position: Sitting, Cuff Size: Large)   Pulse 67   Temp (!) 97.5 F (36.4 C) (Oral)   Ht 5\' 7"  (1.702 m)   Wt 193 lb (87.5 kg)   SpO2 100%   BMI 30.23 kg/m  General:   Alert,  Well-developed, well-nourished, pleasant and cooperative in NAD Abdomen: Non-distended, normal bowel sounds.  Soft and nontender without appreciable mass or hepatosplenomegaly.   Impression/Plan: 82 year old lady with a history of CAD and GERD recent cardiac evaluation negative.  Felt to be deconditioned.  Rectal weight loss recommended.  Omeprazole 20 mg twice daily is not taking the medication ahead of her meal.  Chest discomfort is  positional in nature and likely musculoskeletal in origin.  Runny nose with Carafate.  Recent cardiac evaluation reassuring.  Recommendations  As discussed your chest discomfort seems to be related to musculoskeletal issues rather than something more visceral in the chest.  Recent cardiac evaluation is reassuring.  GERD symptoms likely well-controlled on current regimen.  I recommend continuing omeprazole or Prilosec 20 mg twice daily.  This medication works best if you take it a good 30 minutes before meals.  Take a second dose of Tylenol closer to bedtime rather than at suppertime for arthritis symptoms  Since she had a normal colonoscopy at age 61, I agree with Dr. Reuel Boom, a repeat examination is not indicated.  However, should you develop new bowel symptoms, plans could change.  Unless something comes up, we will plan to patient back in 1 year.       Notice: This dictation was prepared with Dragon dictation along with smaller phrase technology. Any transcriptional errors that result from this process are unintentional and may not be corrected upon review.

## 2023-07-09 DIAGNOSIS — H04123 Dry eye syndrome of bilateral lacrimal glands: Secondary | ICD-10-CM | POA: Diagnosis not present

## 2023-07-09 DIAGNOSIS — L718 Other rosacea: Secondary | ICD-10-CM | POA: Diagnosis not present

## 2023-07-31 DIAGNOSIS — E7849 Other hyperlipidemia: Secondary | ICD-10-CM | POA: Diagnosis not present

## 2023-07-31 DIAGNOSIS — Z1329 Encounter for screening for other suspected endocrine disorder: Secondary | ICD-10-CM | POA: Diagnosis not present

## 2023-07-31 DIAGNOSIS — Z131 Encounter for screening for diabetes mellitus: Secondary | ICD-10-CM | POA: Diagnosis not present

## 2023-07-31 DIAGNOSIS — Z0001 Encounter for general adult medical examination with abnormal findings: Secondary | ICD-10-CM | POA: Diagnosis not present

## 2023-07-31 DIAGNOSIS — K2100 Gastro-esophageal reflux disease with esophagitis, without bleeding: Secondary | ICD-10-CM | POA: Diagnosis not present

## 2023-07-31 DIAGNOSIS — N1832 Chronic kidney disease, stage 3b: Secondary | ICD-10-CM | POA: Diagnosis not present

## 2023-07-31 DIAGNOSIS — Z1321 Encounter for screening for nutritional disorder: Secondary | ICD-10-CM | POA: Diagnosis not present

## 2023-08-04 DIAGNOSIS — H00025 Hordeolum internum left lower eyelid: Secondary | ICD-10-CM | POA: Diagnosis not present

## 2023-08-07 DIAGNOSIS — N1832 Chronic kidney disease, stage 3b: Secondary | ICD-10-CM | POA: Diagnosis not present

## 2023-08-07 DIAGNOSIS — G2581 Restless legs syndrome: Secondary | ICD-10-CM | POA: Diagnosis not present

## 2023-08-07 DIAGNOSIS — E042 Nontoxic multinodular goiter: Secondary | ICD-10-CM | POA: Diagnosis not present

## 2023-08-07 DIAGNOSIS — Z0001 Encounter for general adult medical examination with abnormal findings: Secondary | ICD-10-CM | POA: Diagnosis not present

## 2023-08-07 DIAGNOSIS — I482 Chronic atrial fibrillation, unspecified: Secondary | ICD-10-CM | POA: Diagnosis not present

## 2023-08-07 DIAGNOSIS — I10 Essential (primary) hypertension: Secondary | ICD-10-CM | POA: Diagnosis not present

## 2023-08-07 DIAGNOSIS — J309 Allergic rhinitis, unspecified: Secondary | ICD-10-CM | POA: Diagnosis not present

## 2023-08-07 DIAGNOSIS — I340 Nonrheumatic mitral (valve) insufficiency: Secondary | ICD-10-CM | POA: Diagnosis not present

## 2023-08-07 DIAGNOSIS — E7849 Other hyperlipidemia: Secondary | ICD-10-CM | POA: Diagnosis not present

## 2023-08-18 DIAGNOSIS — H04123 Dry eye syndrome of bilateral lacrimal glands: Secondary | ICD-10-CM | POA: Diagnosis not present

## 2023-08-18 DIAGNOSIS — L718 Other rosacea: Secondary | ICD-10-CM | POA: Diagnosis not present

## 2023-08-18 DIAGNOSIS — H16223 Keratoconjunctivitis sicca, not specified as Sjogren's, bilateral: Secondary | ICD-10-CM | POA: Diagnosis not present

## 2023-08-20 DIAGNOSIS — M47816 Spondylosis without myelopathy or radiculopathy, lumbar region: Secondary | ICD-10-CM | POA: Diagnosis not present

## 2023-08-20 DIAGNOSIS — M1711 Unilateral primary osteoarthritis, right knee: Secondary | ICD-10-CM | POA: Diagnosis not present

## 2023-08-20 DIAGNOSIS — Z1330 Encounter for screening examination for mental health and behavioral disorders, unspecified: Secondary | ICD-10-CM | POA: Diagnosis not present

## 2023-08-20 DIAGNOSIS — G894 Chronic pain syndrome: Secondary | ICD-10-CM | POA: Diagnosis not present

## 2023-08-26 DIAGNOSIS — Z85828 Personal history of other malignant neoplasm of skin: Secondary | ICD-10-CM | POA: Diagnosis not present

## 2023-08-26 DIAGNOSIS — D485 Neoplasm of uncertain behavior of skin: Secondary | ICD-10-CM | POA: Diagnosis not present

## 2023-08-26 DIAGNOSIS — D2272 Melanocytic nevi of left lower limb, including hip: Secondary | ICD-10-CM | POA: Diagnosis not present

## 2023-08-26 DIAGNOSIS — D2262 Melanocytic nevi of left upper limb, including shoulder: Secondary | ICD-10-CM | POA: Diagnosis not present

## 2023-08-26 DIAGNOSIS — L821 Other seborrheic keratosis: Secondary | ICD-10-CM | POA: Diagnosis not present

## 2023-08-26 DIAGNOSIS — D225 Melanocytic nevi of trunk: Secondary | ICD-10-CM | POA: Diagnosis not present

## 2023-08-26 DIAGNOSIS — L814 Other melanin hyperpigmentation: Secondary | ICD-10-CM | POA: Diagnosis not present

## 2023-08-26 DIAGNOSIS — L111 Transient acantholytic dermatosis [Grover]: Secondary | ICD-10-CM | POA: Diagnosis not present

## 2023-08-26 DIAGNOSIS — L080 Pyoderma: Secondary | ICD-10-CM | POA: Diagnosis not present

## 2023-08-26 DIAGNOSIS — L570 Actinic keratosis: Secondary | ICD-10-CM | POA: Diagnosis not present

## 2023-09-19 NOTE — Progress Notes (Unsigned)
Cardiology Office Note Date:  09/19/2023  Patient ID:  Hailey [name], Hailey [name] 05-11-1941, MRN [id number] PCP:  Richardean Chimera, MD  Electrophysiologist: Dr. Elberta Fortis    Chief Complaint:  s/p ER visit  History of Present Illness: Hailey [name] is a 83 y.o. female with history of AFib, RBBB, HTN, HLD, CKD (IIIb)  She saw Dr. Elberta Fortis 01/28/23, mentioned a cough/hoarse voice she attributed to GERD, though had developed some CP, noting her pre-ablation CT nted low Ca burden, she was concerned about th symptoms, sounded atypical , planned for coronary CTa.  CTa not done  03/08/23: ER visit with c/o CP, reported intermittent for a month, woke with cp persisted and went for evaluation HS Trop neg x2 Labs unremarkable with some renal insufficiency noted not far from her baseline. EKG was SR w/RBBB  Saw GI 03/13/23: she also reported reduced exertional capacity over the last year getting winded earier.  Not suspected to be GI necessarily (with negative w/u in the past), have tried to escalate her PPI/acid reduction rx's though she has been intolerant.  Omeprazole continued, rec to f/u with cardiology. Recent URI/coughing, perhaps chest walletiology  I saw her 03/31/23 The time line of her symptoms: Dec 2023, she developed an unusual URI-like couple days, a day of a non-productive cough followed by wheezing Treated for URI/bronchitis with abx, inhaler, dose pack  Sometime about march or so, started on PRN lasix for new ankle/LE edema, xr 20mg  daily PRN, rarely has taken 10mg   July 4th was working outside  when she turned to go do something and had a sudden and very sharp jolt of pain in her chest, went and laid down and seemed to settle away, since about then has had a central CP Non-radiating, occurs most days, less aware of it when active/busy.  All along this has felt like she has had a slow down in general, fatigues easier, gets winded easier, though is able to get through her ADLs. The edema  continues, waxes/wanes  She is also having some weak spells, perhaps more so upon standing, but even after she is up and around feels like she is lightheaded, weak, on a few occasions had to sit down because she though she may faint  No palpitations Doesn't think she ha had and overt Afib at least No bleeding or signs of bleeding  Planned to pursue coronary CT never got done previously, update her echo and get a monitor   -- Tracings associated with her symptoms were normal rhythm and rate Did have some AFlutter/ATach/ though again heart rate controlled and was <1% of the time, and not associated with symptoms -- echo looked good, preserved LVEF, no VHD -- coronary CT w/mild non-obstructive CAD, negative radiology over read  I saw her 05/14/23 Remains with DOE, this is worrisome and bothersome to her Not progressive, though not better either No CP, palpitations No near syncope or syncope No bleeding or signs of bleeding On flecainide RBBB pre-dates start of drug and intervals are stable Low burden on recent monitor Mild, 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 Elberta Fortis, felt reasonable to continue her flecainide DO not suspect to be cardia Recommended PMD/consider pulm eval BMET >> to guide diuretic consideration for poss HFpEF  Ultimately started on low dose lasix > stable labs   *** lasix helped by phone note *** symptoms, burden ***flecainide EKG, nodal blocker *** eliquis, dose, bleeding, labs   Device information PVI ablation 08/20/2018  PVI ablation 05/11/2019 (arrived to the procedure in an ATach) Flecainide started Feb 2020  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   Migraine headache    Palpitations    Restless leg syndrome    Vaginal atrophy     Past Surgical History:  Procedure  Laterality Date   APPENDECTOMY     ATRIAL FIBRILLATION ABLATION N/A 08/20/2018   Procedure: ATRIAL FIBRILLATION ABLATION;  Surgeon: Regan Lemming, MD;  [facility]: [facility];  Service: Cardiovascular;  Laterality: N/A;   ATRIAL FIBRILLATION ABLATION N/A 05/11/2019   Procedure: ATRIAL FIBRILLATION ABLATION;  Surgeon: Regan Lemming, MD;  [facility]: [facility];  Service: Cardiovascular;  Laterality: N/A;   BIOPSY  06/06/2020   Procedure: BIOPSY;  Surgeon: Corbin Ade, MD;  [facility];  Service: Endoscopy;;  gastric   CARDIAC CATHETERIZATION  08/05/2003   cone   CARDIOVERSION N/A 05/02/2019   Procedure: CARDIOVERSION;  Surgeon: Jodelle Red, MD;  Location: [facility] ENDOSCOPY;  Service: Cardiovascular;  Laterality: N/A;   COLONOSCOPY     ESOPHAGOGASTRODUODENOSCOPY N/A 06/06/2020   benign gastric polyps and normal esophagus status post dilation, normal duodenum   HERNIA REPAIR     LEFT HEART CATHETERIZATION WITH CORONARY ANGIOGRAM N/A 04/12/2012   Procedure: LEFT HEART CATHETERIZATION WITH CORONARY ANGIOGRAM;  Surgeon: Kathleene Hazel, MD;  Location: [facility] CATH LAB;  Service: Cardiovascular;  Laterality: N/A;   MALONEY DILATION N/A 06/06/2020   Procedure: [name] DILATION;  Surgeon: Corbin Ade, MD;  [facility];  Service: Endoscopy;  Laterality: N/A;   POLYPECTOMY  06/06/2020   Procedure: POLYPECTOMY;  Surgeon: Corbin Ade, MD;  [facility];  Service: Endoscopy;;  gastric   TOTAL ABDOMINAL HYSTERECTOMY      Current Outpatient Medications  Medication Sig Dispense Refill   acetaminophen (TYLENOL) 650 MG CR tablet Take 1,300 mg by mouth 2 (two) times daily.     aluminum-magnesium hydroxide 200-200 MG/5ML suspension Take by mouth every 6 (six) hours as needed for indigestion.     apixaban (ELIQUIS) 5 MG TABS tablet Take 5 mg by mouth 2 (two) times daily.     Calcium Carb-Cholecalciferol (CALCIUM 600+D3 PO) Take  1 tablet by mouth daily.      calcium carbonate (TUMS - DOSED IN MG ELEMENTAL CALCIUM) 500 MG chewable tablet Chew 1 tablet by mouth as needed for indigestion or heartburn.     diclofenac Sodium (VOLTAREN) 1 % GEL Apply topically as directed.     diltiazem (CARDIZEM CD) 120 MG 24 hr capsule Take 1 capsule (120 mg total) by mouth daily. 90 capsule 3   flecainide (TAMBOCOR) 50 MG tablet TAKE 1 TABLET BY MOUTH TWICE A DAY 180 tablet 3   fluticasone (FLONASE) 50 MCG/ACT nasal spray Place 2 sprays into both nostrils daily.     furosemide (LASIX) 20 MG tablet Take 0.5 tablets (10 mg total) by mouth daily. 45 tablet 3   gabapentin (NEURONTIN) 100 MG capsule Take 100 mg by mouth at bedtime.     nystatin cream (MYCOSTATIN) Apply 1 Application topically 3 (three) times daily.     omeprazole (PRILOSEC) 20 MG capsule Take 20 mg by mouth 2 (two) times daily before a meal.     OVER THE COUNTER MEDICATION  Take 1 capsule by mouth daily. Focus Select Areds2 for macular degeneration     No current facility-administered medications for this visit.    Allergies:   Amiodarone, Dexilant [dexlansoprazole], Flomax [tamsulosin hcl], Nexium [esomeprazole], and Pantoprazole sodium   Social History:  The patient  reports that she has never smoked. She has never used smokeless tobacco. She reports that she does not currently use alcohol. She reports that she does not use drugs.   Family History:  The patient's family history includes Coronary artery disease in her father; Heart attack in her brother; Heart disease in her brother; Hypertension in her brother and mother; Other in her brother, sister, and sister; Stroke in her brother and brother; Transient ischemic attack in her brother.  ROS:  Please see the history of present illness.    All other systems are reviewed and otherwise negative.   PHYSICAL EXAM:  VS:  There were no vitals taken for this visit. BMI: There is no height or weight on file to calculate  BMI. Well nourished, well developed, in no acute distress HEENT: normocephalic, atraumatic Neck: no JVD, carotid bruits or masses Cardiac: *** RRR; no significant murmurs, no rubs, or gallops Lungs: *** CTA b/l, no wheezing, rhonchi or rales Abd: soft, nontender MS: no deformity or atrophy Ext: *** no edema Skin: warm and dry, no rash Neuro:  No gross deficits appreciated Psych: euthymic mood, full affect    EKG:  Done today and reviewed by myself shows  ***  SR 1st degree AVBlock , RBBB  SR 79bpm, 1st degree AVBlock , RBBB ( ) PR wobbles, was in 2020 Pre-flecainide  PR , RBBB > in 2020  04/08/23: Coronary CT IMPRESSION: 1. Coronary calcium score of 99. This was 44th percentile for age and sex matched control.   2. Total plaque volume 257mm3 which is 31st percentile for age and sex-matched controls (calcified plaque 49mm3; noncalcified plaque 228mm3). TPV is moderate   3.  Normal coronary origin with right dominance.   4.  Nonobstructive CAD   5.  Mixed plaque in proximal LAD causes mild (25-49%) stenosis   6.  Minimal (0-24%) stenosis in mid LAD and proximal RCA   CAD-RADS 2. Mild non-obstructive CAD (25-49%). Consider non-atherosclerotic causes of chest pain. Consider preventive therapy and risk factor modification.  04/17/23: TTE 1. Left ventricular ejection fraction, by estimation, is 60 to 65%. The  left ventricle has normal function. The left ventricle has no regional  wall motion abnormalities. Left ventricular diastolic function could not  be evaluated.   2. Right ventricular systolic function is low normal. The right  ventricular size is normal. Tricuspid regurgitation signal is inadequate  for assessing PA pressure.   3. The mitral valve is grossly normal. Mild mitral valve regurgitation.   4. The aortic valve is tricuspid. Aortic valve regurgitation is mild.  Aortic valve sclerosis/calcification is present,  without any evidence of  aortic stenosis.   5. The inferior vena cava is normal in size with greater than 50%  respiratory variability, suggesting right atrial pressure of 3 mmHg.   Sept 2024: monitor Patient had a min HR of 46 bpm, max HR of 125 bpm, and avg HR of 68 bpm.  Predominant underlying rhythm was Sinus Rhythm.  Atrial Flutter versus atrial tachycardia occurred (<1% burden), heart rate 64-125 bpm (avg of 94 bpm), longest 37 mins 35 secs, avg rate of 94 bpm.  Less than 1% ventricular and supraventricular ectopy Patient triggered episodes  associated with sinus rhythm   05/19/2021: TTE 1. Left ventricular ejection fraction, by estimation, is 60 to 65%. The  left ventricle has normal function. The left ventricle has no regional  wall motion abnormalities. There is mild concentric left ventricular  hypertrophy. Left ventricular diastolic  parameters are consistent with Grade II diastolic dysfunction  (pseudonormalization).   2. Right ventricular systolic function is normal. The right ventricular  size is normal.   3. The mitral valve is normal in structure. Mild mitral valve  regurgitation. No evidence of mitral stenosis.   4. The aortic valve is normal in structure. Aortic valve regurgitation is  mild to moderate. No aortic stenosis is present.   5. The inferior vena cava is normal in size with greater than 50%  respiratory variability, suggesting right atrial pressure of 3 mmHg.     05/11/2019: EPS/ablation CONCLUSIONS: 1. Atrial tachycardia upon presentation.   2. Successful electrical isolation and anatomical encircling of the left pulmonary veins with radiofrequency current.  A WACA approach was used 3. Additional left atrial ablation was performed along the roof of the left atrium 4. No early apparent complications.  08/20/2018: EPS/ablation CONCLUSIONS: 1. Sinus rhythm upon presentation.   2. Successful electrical isolation and anatomical encircling of all four  pulmonary veins with radiofrequency current.    3. Cavo-tricuspid isthmus ablation was performed with complete bidirectional isthmus block achieved.  4. No inducible arrhythmias following ablation both on and off of dobutamine 5. No early apparent complications.   05/04/2019: cardiac CT IMPRESSION: 1. Calcium score 40 limited to LAD this is only 39 % for age and sex. Non obstructive 1-24% calcific plaque in proximal LAD 2.  Severe RAE and moderate LAE No LAA thrombus 3.  Normal aortic root 3.2 cm 4.  Normal PV anatomy including small right middle PV 5.  No pericardial effusion IMPRESSION: Cardiomegaly.  No acute extra cardiac abnormality.   Recent Labs: 03/08/2023: ALT 15; Hemoglobin 12.9; Magnesium 2.3; Platelets 273 05/29/2023: BUN 15; Creatinine, Ser 1.21; Potassium 4.4; Sodium 141  No results found for requested labs within last 365 days.   CrCl cannot be calculated (Patient's most recent lab result is older than the maximum 21 days allowed.).   Wt Readings from Last 3 Encounters:  07/06/23 193 lb (87.5 kg)  05/18/23 195 lb (88.5 kg)  05/14/23 196 lb (88.9 kg)     Other studies reviewed: Additional studies/records reviewed today include: summarized above  ASSESSMENT AND PLAN:  Paroxysmal Afib ATach CHA2DS2Vasc is 4, on Eliquis, appropriately dosed *** flecainide w/***, *** intervals *** burden by    HTN *** No changes today  Secondary hypercoagulable state 2/2 AFib  CP Mild non-obstructive CAD by CT Preserved LVEF, no VHD Non-cardiac etiology Not an ongoing complaint  6. DOE No rest or nocturnal symptoms No exam findings to suggest volume OL Discussed I do not suspect cardiac etiology, consider pulmonary evaluation, she will discuss with her PMD Discussed starting to exercise and try to improve conditioning and weight loss Discussed possibly HFpEF, discussed possibly low dose daily diuretic ***    Disposition: *** back in 32mo, sooner if  needed  Current medicines are reviewed at length with the patient today.  The patient did not have any concerns regarding medicines.  Norma Fredrickson, PA-C 09/19/2023 10:21 AM     [facility] [address] [place] [address] [phone] (office)  [phone] (fax)

## 2023-09-21 ENCOUNTER — Encounter: Payer: Self-pay | Admitting: Physician Assistant

## 2023-09-21 ENCOUNTER — Ambulatory Visit: Payer: Medicare PPO | Attending: Physician Assistant | Admitting: Physician Assistant

## 2023-09-21 VITALS — BP 122/66 | HR 56 | Ht 67.0 in | Wt 191.8 lb

## 2023-09-21 DIAGNOSIS — I10 Essential (primary) hypertension: Secondary | ICD-10-CM

## 2023-09-21 DIAGNOSIS — I4719 Other supraventricular tachycardia: Secondary | ICD-10-CM

## 2023-09-21 DIAGNOSIS — D6869 Other thrombophilia: Secondary | ICD-10-CM | POA: Diagnosis not present

## 2023-09-21 DIAGNOSIS — R0602 Shortness of breath: Secondary | ICD-10-CM

## 2023-09-21 DIAGNOSIS — Z5181 Encounter for therapeutic drug level monitoring: Secondary | ICD-10-CM

## 2023-09-21 DIAGNOSIS — Z79899 Other long term (current) drug therapy: Secondary | ICD-10-CM | POA: Diagnosis not present

## 2023-09-21 DIAGNOSIS — I480 Paroxysmal atrial fibrillation: Secondary | ICD-10-CM

## 2023-09-21 MED ORDER — FUROSEMIDE 20 MG PO TABS: 20.0000 mg | ORAL_TABLET | Freq: Every day | ORAL | 2 refills | Status: DC

## 2023-09-21 NOTE — Patient Instructions (Signed)
Medication Instructions:   START TAKING :   FUROSEMIDE  20 MG ONCE A DAY   *If you need a refill on your cardiac medications before your next appointment, please call your pharmacy*   Lab Work:   PLEASE GO DOWN STAIRS  [facility]  FIRST FLOOR  [address] ( GET OFF ELEVATORS MAKE A LEFT AND ANOTHER LEFT LAB ON RIGHT DOWN HALLWAY :  RETURN IN  7-10 DAYS  BMET AND CBC TODAY     If you have labs (blood work) drawn today and your tests are completely normal, you will receive your results only by: MyChart Message (if you have MyChart) OR A paper copy in the mail If you have any lab test that is abnormal or we need to change your treatment, we will call you to review the results.   Testing/Procedures:  NONE ORDERED  TODAY   Follow-Up: At [facility], you and your health needs are our priority.  As part of our continuing mission to provide you with exceptional heart care, we have created designated Provider Care Teams.  These Care Teams include your primary Cardiologist (physician) and Advanced Practice Providers (APPs -  Physician Assistants and Nurse Practitioners) who all work together to provide you with the care you need, when you need it.  We recommend signing up for the patient portal called "MyChart".  Sign up information is provided on this After Visit Summary.  MyChart is used to connect with patients for Virtual Visits (Telemedicine).  Patients are able to view lab/test results, encounter notes, upcoming appointments, etc.  Non-urgent messages can be sent to your provider as well.   To learn more about what you can do with MyChart, go to [email or web address].    Your next appointment:   3 -4 month(s)  Provider:   Francis Dowse, PA-C    Other Instructions    1st Floor: - Lobby - Registration  - Pharmacy  - Lab - Cafe  2nd Floor: - PV Lab - Diagnostic Testing (echo, CT, nuclear med)  3rd Floor: - Vacant  4th Floor: - TCTS (cardiothoracic surgery) - AFib  Clinic - [facility] - Vascular Surgery  - Vascular Ultrasound  5th Floor: - [facility] (general and EP) - Clinical Pharmacy for coumadin, hypertension, lipid, weight-loss medications, and med management appointments    Valet parking services will be available as well.

## 2023-09-29 DIAGNOSIS — Z5181 Encounter for therapeutic drug level monitoring: Secondary | ICD-10-CM | POA: Diagnosis not present

## 2023-09-29 DIAGNOSIS — Z79899 Other long term (current) drug therapy: Secondary | ICD-10-CM | POA: Diagnosis not present

## 2023-09-29 LAB — CBC

## 2023-09-30 LAB — CBC
Hematocrit: 37 % (ref 34.0–46.6)
Hemoglobin: 12 g/dL (ref 11.1–15.9)
MCH: 29.7 pg (ref 26.6–33.0)
MCHC: 32.4 g/dL (ref 31.5–35.7)
MCV: 92 fL (ref 79–97)
Platelets: 294 10*3/uL (ref 150–450)
RBC: 4.04 x10E6/uL (ref 3.77–5.28)
RDW: 14.7 % (ref 11.7–15.4)
WBC: 5.6 10*3/uL (ref 3.4–10.8)

## 2023-09-30 LAB — BASIC METABOLIC PANEL
BUN/Creatinine Ratio: 13 (ref 12–28)
BUN: 17 mg/dL (ref 8–27)
CO2: 26 mmol/L (ref 20–29)
Calcium: 9.7 mg/dL (ref 8.7–10.3)
Chloride: 104 mmol/L (ref 96–106)
Creatinine, Ser: 1.34 mg/dL — ABNORMAL HIGH (ref 0.57–1.00)
Glucose: 79 mg/dL (ref 70–99)
Potassium: 3.9 mmol/L (ref 3.5–5.2)
Sodium: 143 mmol/L (ref 134–144)
eGFR: 39 mL/min/{1.73_m2} — ABNORMAL LOW (ref >59)

## 2023-10-01 DIAGNOSIS — I4821 Permanent atrial fibrillation: Secondary | ICD-10-CM | POA: Diagnosis not present

## 2023-10-01 DIAGNOSIS — E782 Mixed hyperlipidemia: Secondary | ICD-10-CM | POA: Diagnosis not present

## 2023-10-01 DIAGNOSIS — I10 Essential (primary) hypertension: Secondary | ICD-10-CM | POA: Diagnosis not present

## 2023-10-01 DIAGNOSIS — K2100 Gastro-esophageal reflux disease with esophagitis, without bleeding: Secondary | ICD-10-CM | POA: Diagnosis not present

## 2023-10-01 DIAGNOSIS — R600 Localized edema: Secondary | ICD-10-CM | POA: Diagnosis not present

## 2023-10-02 DIAGNOSIS — Z6829 Body mass index (BMI) 29.0-29.9, adult: Secondary | ICD-10-CM | POA: Diagnosis not present

## 2023-10-02 DIAGNOSIS — R1313 Dysphagia, pharyngeal phase: Secondary | ICD-10-CM | POA: Diagnosis not present

## 2023-10-15 DIAGNOSIS — E042 Nontoxic multinodular goiter: Secondary | ICD-10-CM | POA: Diagnosis not present

## 2023-10-21 DIAGNOSIS — R0989 Other specified symptoms and signs involving the circulatory and respiratory systems: Secondary | ICD-10-CM | POA: Diagnosis not present

## 2023-10-22 DIAGNOSIS — M47816 Spondylosis without myelopathy or radiculopathy, lumbar region: Secondary | ICD-10-CM | POA: Diagnosis not present

## 2023-11-02 DIAGNOSIS — R600 Localized edema: Secondary | ICD-10-CM | POA: Diagnosis not present

## 2023-11-02 DIAGNOSIS — K2100 Gastro-esophageal reflux disease with esophagitis, without bleeding: Secondary | ICD-10-CM | POA: Diagnosis not present

## 2023-11-02 DIAGNOSIS — M47816 Spondylosis without myelopathy or radiculopathy, lumbar region: Secondary | ICD-10-CM | POA: Diagnosis not present

## 2023-11-02 DIAGNOSIS — E782 Mixed hyperlipidemia: Secondary | ICD-10-CM | POA: Diagnosis not present

## 2023-11-02 DIAGNOSIS — I4821 Permanent atrial fibrillation: Secondary | ICD-10-CM | POA: Diagnosis not present

## 2023-11-02 DIAGNOSIS — I10 Essential (primary) hypertension: Secondary | ICD-10-CM | POA: Diagnosis not present

## 2023-11-16 DIAGNOSIS — M47816 Spondylosis without myelopathy or radiculopathy, lumbar region: Secondary | ICD-10-CM | POA: Diagnosis not present

## 2023-12-02 DIAGNOSIS — R600 Localized edema: Secondary | ICD-10-CM | POA: Diagnosis not present

## 2023-12-02 DIAGNOSIS — I10 Essential (primary) hypertension: Secondary | ICD-10-CM | POA: Diagnosis not present

## 2023-12-02 DIAGNOSIS — I4821 Permanent atrial fibrillation: Secondary | ICD-10-CM | POA: Diagnosis not present

## 2023-12-02 DIAGNOSIS — K2100 Gastro-esophageal reflux disease with esophagitis, without bleeding: Secondary | ICD-10-CM | POA: Diagnosis not present

## 2023-12-02 DIAGNOSIS — E782 Mixed hyperlipidemia: Secondary | ICD-10-CM | POA: Diagnosis not present

## 2023-12-03 DIAGNOSIS — N1832 Chronic kidney disease, stage 3b: Secondary | ICD-10-CM | POA: Diagnosis not present

## 2023-12-03 DIAGNOSIS — I10 Essential (primary) hypertension: Secondary | ICD-10-CM | POA: Diagnosis not present

## 2023-12-03 DIAGNOSIS — E7849 Other hyperlipidemia: Secondary | ICD-10-CM | POA: Diagnosis not present

## 2023-12-03 DIAGNOSIS — Z131 Encounter for screening for diabetes mellitus: Secondary | ICD-10-CM | POA: Diagnosis not present

## 2023-12-03 DIAGNOSIS — K219 Gastro-esophageal reflux disease without esophagitis: Secondary | ICD-10-CM | POA: Diagnosis not present

## 2023-12-03 DIAGNOSIS — E782 Mixed hyperlipidemia: Secondary | ICD-10-CM | POA: Diagnosis not present

## 2023-12-10 DIAGNOSIS — N1832 Chronic kidney disease, stage 3b: Secondary | ICD-10-CM | POA: Diagnosis not present

## 2023-12-10 DIAGNOSIS — Z6829 Body mass index (BMI) 29.0-29.9, adult: Secondary | ICD-10-CM | POA: Diagnosis not present

## 2023-12-10 DIAGNOSIS — K2100 Gastro-esophageal reflux disease with esophagitis, without bleeding: Secondary | ICD-10-CM | POA: Diagnosis not present

## 2023-12-10 DIAGNOSIS — E782 Mixed hyperlipidemia: Secondary | ICD-10-CM | POA: Diagnosis not present

## 2023-12-10 DIAGNOSIS — I10 Essential (primary) hypertension: Secondary | ICD-10-CM | POA: Diagnosis not present

## 2023-12-17 DIAGNOSIS — M1711 Unilateral primary osteoarthritis, right knee: Secondary | ICD-10-CM | POA: Diagnosis not present

## 2023-12-17 DIAGNOSIS — G894 Chronic pain syndrome: Secondary | ICD-10-CM | POA: Diagnosis not present

## 2023-12-17 DIAGNOSIS — M47816 Spondylosis without myelopathy or radiculopathy, lumbar region: Secondary | ICD-10-CM | POA: Diagnosis not present

## 2023-12-17 DIAGNOSIS — M1611 Unilateral primary osteoarthritis, right hip: Secondary | ICD-10-CM | POA: Diagnosis not present

## 2023-12-21 ENCOUNTER — Ambulatory Visit: Payer: Medicare PPO | Admitting: Physician Assistant

## 2023-12-21 NOTE — Progress Notes (Signed)
 Cardiology Office Note Date:  12/21/2023  Patient ID:  Hailey [name], Hailey [name] 26-Oct-1940, MRN [id number] PCP:  Leesa Pulling, MD  Electrophysiologist: Dr. Lawana Pray    Chief Complaint:   flecainide  visit   History of Present Illness: Hailey [name] is a 83 y.o. female with history of AFib, RBBB, HTN, HLD, CKD (IIIb)  She saw Dr. Lawana Pray 01/28/23, mentioned a cough/hoarse voice she attributed to GERD, though had developed some CP, noting her pre-ablation CT nted low Ca burden, she was concerned about th symptoms, sounded atypical , planned for coronary CTa.  CTa not done  03/08/23: ER visit with c/o CP, reported intermittent for a month, woke with cp persisted and went for evaluation HS Trop neg x2 Labs unremarkable with some renal insufficiency noted not far from her baseline. EKG was SR w/RBBB  Saw GI 03/13/23: she also reported reduced exertional capacity over the last year getting winded earier.  Not suspected to be GI necessarily (with negative w/u in the past), have tried to escalate her PPI/acid reduction rx's though she has been intolerant.  Omeprazole  continued, rec to f/u with cardiology. Recent URI/coughing, perhaps chest walletiology  I saw her 03/31/23 The time line of her symptoms: Dec 2023, she developed an unusual URI-like couple days, a day of a non-productive cough followed by wheezing Treated for URI/bronchitis with abx, inhaler, dose pack  Sometime about march or so, started on PRN lasix  for new ankle/LE edema, xr 20mg  daily PRN, rarely has taken 10mg   July 4th was working outside  when she turned to go do something and had a sudden and very sharp jolt of pain in her chest, went and laid down and seemed to settle away, since about then has had a central CP Non-radiating, occurs most days, less aware of it when active/busy.  All along this has felt like she has had a slow down in general, fatigues easier, gets winded easier, though is able to get through her  ADLs. The edema continues, waxes/wanes  She is also having some weak spells, perhaps more so upon standing, but even after she is up and around feels like she is lightheaded, weak, on a few occasions had to sit down because she though she may faint  No palpitations Doesn't think she ha had and overt Afib at least No bleeding or signs of bleeding  Planned to pursue coronary CT never got done previously, update her echo and get a monitor   -- Tracings associated with her symptoms were normal rhythm and rate Did have some AFlutter/ATach/ though again heart rate controlled and was <1% of the time, and not associated with symptoms -- echo looked good, preserved LVEF, no VHD -- coronary CT w/mild non-obstructive CAD, negative radiology over read   I saw her 05/14/23 Remains with DOE, this is worrisome and bothersome to her Not progressive, though not better either No CP, palpitations No near syncope or syncope No bleeding or signs of bleeding On flecainide  RBBB pre-dates start of drug and intervals are stable Low burden on recent monitor Mild, 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 Lawana Pray, felt reasonable to continue her flecainide  DO not suspect to be cardia Recommended PMD/consider pulm eval BMET >> to guide diuretic consideration for poss HFpEF  Ultimately started on low dose lasix  > stable labs   I saw her Feb 2025 She is doing OK Continues to feel a bit nauseous and wobbly in the mornings > wears off  as she is up and around Sees GI, he did not think was GERD advised she take the Tylenol  at HS The small dose of lasix  did seem to improve her DOE, remains with some, but her ankle swelling is much improved. No CP, palpitations, bleeding, reported Discussed DOE likely multifactorial, lasix  increased  TODAY  She has some fleeting palpitations, they are not particularly bothersome. She has the same sharp central CP, not changing/escalation Not  exertional Edema is much better DOE is also much better but will get a little "wheezing" on occasion. No near syncope or syncope. No bleeding or signs of bleeding  Arrhythmia/AAD hx PVI ablation 08/20/2018 PVI ablation 05/11/2019 (arrived to the procedure in an ATach) Flecainide  started Feb 2020  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   Migraine headache    Palpitations    Restless leg syndrome    Vaginal atrophy     Past Surgical History:  Procedure Laterality Date   APPENDECTOMY     ATRIAL FIBRILLATION ABLATION N/A 08/20/2018   Procedure: ATRIAL FIBRILLATION ABLATION;  Surgeon: Lei Pump, MD;  Location: [facility] INVASIVE CV LAB;  Service: Cardiovascular;  Laterality: N/A;   ATRIAL FIBRILLATION ABLATION N/A 05/11/2019   Procedure: ATRIAL FIBRILLATION ABLATION;  Surgeon: Lei Pump, MD;  Location: [facility] INVASIVE CV LAB;  Service: Cardiovascular;  Laterality: N/A;   BIOPSY  06/06/2020   Procedure: BIOPSY;  Surgeon: Suzette Espy, MD;  Location: [facility] ENDO SUITE;  Service: Endoscopy;;  gastric   CARDIAC CATHETERIZATION  08/05/2003   cone   CARDIOVERSION N/A 05/02/2019   Procedure: CARDIOVERSION;  Surgeon: Sheryle Donning, MD;  Location: [facility] ENDOSCOPY;  Service: Cardiovascular;  Laterality: N/A;   COLONOSCOPY     ESOPHAGOGASTRODUODENOSCOPY N/A 06/06/2020   benign gastric polyps and normal esophagus status post dilation, normal duodenum   HERNIA REPAIR     LEFT HEART CATHETERIZATION WITH CORONARY ANGIOGRAM N/A 04/12/2012   Procedure: LEFT HEART CATHETERIZATION WITH CORONARY ANGIOGRAM;  Surgeon: Odie Benne, MD;  Location: [facility] CATH LAB;  Service: Cardiovascular;  Laterality: N/A;   MALONEY DILATION N/A 06/06/2020   Procedure: Londa Rival DILATION;  Surgeon: Suzette Espy, MD;  Location: [facility];   Service: Endoscopy;  Laterality: N/A;   POLYPECTOMY  06/06/2020   Procedure: POLYPECTOMY;  Surgeon: Suzette Espy, MD;  Location: [facility];  Service: Endoscopy;;  gastric   TOTAL ABDOMINAL HYSTERECTOMY      Current Outpatient Medications  Medication Sig Dispense Refill   acetaminophen  (TYLENOL ) 650 MG CR tablet Take 1,300 mg by mouth 2 (two) times daily.     aluminum-magnesium  hydroxide 200-200 MG/5ML suspension Take by mouth every 6 (six) hours as needed for indigestion.     apixaban  (ELIQUIS ) 5 MG TABS tablet Take 5 mg by mouth 2 (two) times daily.     calcium  carbonate (TUMS - DOSED IN MG ELEMENTAL CALCIUM ) 500 MG chewable tablet Chew 1 tablet by mouth as needed for indigestion or heartburn.     diclofenac Sodium (VOLTAREN) 1 % GEL Apply topically as directed.     diltiazem  (CARDIZEM  CD) 120 MG 24 hr capsule Take 1 capsule (120 mg total) by mouth daily. 90 capsule 3   flecainide  (TAMBOCOR ) 50 MG tablet TAKE 1 TABLET BY MOUTH TWICE A DAY 180 tablet  3   fluticasone  (FLONASE ) 50 MCG/ACT nasal spray Place 2 sprays into both nostrils daily.     furosemide  (LASIX ) 20 MG tablet Take 1 tablet (20 mg total) by mouth daily. 90 tablet 2   gabapentin  (NEURONTIN ) 100 MG capsule Take 100 mg by mouth at bedtime.     nystatin cream (MYCOSTATIN) Apply 1 Application topically 3 (three) times daily.     omeprazole  (PRILOSEC) 20 MG capsule Take 20 mg by mouth 2 (two) times daily before a meal.     OVER THE COUNTER MEDICATION Take 1 capsule by mouth daily. Focus Select Areds2 for macular degeneration     No current facility-administered medications for this visit.    Allergies:   Amiodarone , Dexilant [dexlansoprazole], Flomax [tamsulosin hcl], Nexium  [esomeprazole ], and Pantoprazole  sodium   Social History:  The patient  reports that she has never smoked. She has never used smokeless tobacco. She reports that she does not currently use alcohol . She reports that she does not use drugs.   Family  History:  The patient's family history includes Coronary artery disease in her father; Heart attack in her brother; Heart disease in her brother; Hypertension in her brother and mother; Other in her brother, sister, and sister; Stroke in her brother and brother; Transient ischemic attack in her brother.  ROS:  Please see the history of present illness.    All other systems are reviewed and otherwise negative.   PHYSICAL EXAM:  VS:  There were no vitals taken for this visit. BMI: There is no height or weight on file to calculate BMI. Well nourished, well developed, in no acute distress HEENT: normocephalic, atraumatic Neck: no JVD, carotid bruits or masses Cardiac:  RRR; no significant murmurs, no rubs, or gallops Lungs: CTA b/l, no wheezing, rhonchi or rales Abd: soft, nontender MS: no deformity or atrophy Ext: no edema Skin: warm and dry, no rash Neuro:  No gross deficits appreciated Psych: euthymic mood, full affect    EKG:  Done today and reviewed by myself shows  SB 59bpm, PR , (personally measured ), RBBB, QRS , QTc  SB 56bpm, PR , RBBB, QRS , QTc SR 1st degree AVBlock , RBBB  SR 79bpm, 1st degree AVBlock , RBBB ( ) PR wobbles, was in 2020 Pre-flecainide   PR , RBBB > in 2020  04/08/23: Coronary CT IMPRESSION: 1. Coronary calcium  score of 99. This was 44th percentile for age and sex matched control.   2. Total plaque volume 277mm3 which is 31st percentile for age and sex-matched controls (calcified plaque 57mm3; noncalcified plaque 239mm3). TPV is moderate   3.  Normal coronary origin with right dominance.   4.  Nonobstructive CAD   5.  Mixed plaque in proximal LAD causes mild (25-49%) stenosis   6.  Minimal (0-24%) stenosis in mid LAD and proximal RCA   CAD-RADS 2. Mild non-obstructive CAD (25-49%). Consider non-atherosclerotic causes of chest pain. Consider preventive therapy and  risk factor modification.  04/17/23: TTE 1. Left ventricular ejection fraction, by estimation, is 60 to 65%. The  left ventricle has normal function. The left ventricle has no regional  wall motion abnormalities. Left ventricular diastolic function could not  be evaluated.   2. Right ventricular systolic function is low normal. The right  ventricular size is normal. Tricuspid regurgitation signal is inadequate  for assessing PA pressure.   3. The mitral valve is grossly normal. Mild mitral valve regurgitation.   4. The aortic valve  is tricuspid. Aortic valve regurgitation is mild.  Aortic valve sclerosis/calcification is present, without any evidence of  aortic stenosis.   5. The inferior vena cava is normal in size with greater than 50%  respiratory variability, suggesting right atrial pressure of 3 mmHg.   Sept 2024: monitor Patient had a min HR of 46 bpm, max HR of 125 bpm, and avg HR of 68 bpm.  Predominant underlying rhythm was Sinus Rhythm.  Atrial Flutter versus atrial tachycardia occurred (<1% burden), heart rate 64-125 bpm (avg of 94 bpm), longest 37 mins 35 secs, avg rate of 94 bpm.  Less than 1% ventricular and supraventricular ectopy Patient triggered episodes associated with sinus rhythm   05/19/2021: TTE 1. Left ventricular ejection fraction, by estimation, is 60 to 65%. The  left ventricle has normal function. The left ventricle has no regional  wall motion abnormalities. There is mild concentric left ventricular  hypertrophy. Left ventricular diastolic  parameters are consistent with Grade II diastolic dysfunction  (pseudonormalization).   2. Right ventricular systolic function is normal. The right ventricular  size is normal.   3. The mitral valve is normal in structure. Mild mitral valve  regurgitation. No evidence of mitral stenosis.   4. The aortic valve is normal in structure. Aortic valve regurgitation is  mild to moderate. No aortic stenosis is present.    5. The inferior vena cava is normal in size with greater than 50%  respiratory variability, suggesting right atrial pressure of 3 mmHg.     05/11/2019: EPS/ablation CONCLUSIONS: 1. Atrial tachycardia upon presentation.   2. Successful electrical isolation and anatomical encircling of the left pulmonary veins with radiofrequency current.  A WACA approach was used 3. Additional left atrial ablation was performed along the roof of the left atrium 4. No early apparent complications.  08/20/2018: EPS/ablation CONCLUSIONS: 1. Sinus rhythm upon presentation.   2. Successful electrical isolation and anatomical encircling of all four pulmonary veins with radiofrequency current.    3. Cavo-tricuspid isthmus ablation was performed with complete bidirectional isthmus block achieved.  4. No inducible arrhythmias following ablation both on and off of dobutamine  5. No early apparent complications.   05/04/2019: cardiac CT IMPRESSION: 1. Calcium  score 40 limited to LAD this is only 39 % for age and sex. Non obstructive 1-24% calcific plaque in proximal LAD 2.  Severe RAE and moderate LAE No LAA thrombus 3.  Normal aortic root 3.2 cm 4.  Normal PV anatomy including small right middle PV 5.  No pericardial effusion IMPRESSION: Cardiomegaly.  No acute extra cardiac abnormality.   Recent Labs: 03/08/2023: ALT 15; Magnesium  2.3 09/29/2023: BUN 17; Creatinine, Ser 1.34; Hemoglobin 12.0; Platelets 294; Potassium 3.9; Sodium 143  No results found for requested labs within last 365 days.   CrCl cannot be calculated (Patient's most recent lab result is older than the maximum 21 days allowed.).   Wt Readings from Last 3 Encounters:  09/21/23 191 lb 12.8 oz (87 kg)  07/06/23 193 lb (87.5 kg)  05/18/23 195 lb (88.5 kg)     Other studies reviewed: Additional studies/records reviewed today include: summarized above  ASSESSMENT AND PLAN:  Paroxysmal Afib ATach CHA2DS2Vasc is 4, on Eliquis ,  appropriately dosed flecainide  w/dilt, stable intervals Low/no burden by symptoms   HTN No changes today  Secondary hypercoagulable state 2/2 AFib  CP Mild non-obstructive CAD by CT Sept 2024 Preserved LVEF, no VHD Felt to be a non-cardiac etiology Suspect GERD, vs musculoskeletal  6. DOE Much better Edema  remains better Discussed age, sedentary/deconditioning weight all may play a role on some DOE HFpEF Update BMET   7.  Secondary hypercoagulable state    Disposition: back in 4, sooner if needed  Current medicines are reviewed at length with the patient today.  The patient did not have any concerns regarding medicines.  [place], PA-C 12/21/2023 8:53 AM     [facility] [address] [place] [address] [phone] (office)  [phone] (fax)

## 2023-12-22 ENCOUNTER — Encounter: Payer: Self-pay | Admitting: Physician Assistant

## 2023-12-22 ENCOUNTER — Ambulatory Visit: Attending: Physician Assistant | Admitting: Physician Assistant

## 2023-12-22 VITALS — BP 112/70 | HR 59 | Ht 67.0 in | Wt 190.8 lb

## 2023-12-22 DIAGNOSIS — I4719 Other supraventricular tachycardia: Secondary | ICD-10-CM | POA: Diagnosis not present

## 2023-12-22 DIAGNOSIS — D6869 Other thrombophilia: Secondary | ICD-10-CM | POA: Diagnosis not present

## 2023-12-22 DIAGNOSIS — Z79899 Other long term (current) drug therapy: Secondary | ICD-10-CM

## 2023-12-22 DIAGNOSIS — I5030 Unspecified diastolic (congestive) heart failure: Secondary | ICD-10-CM

## 2023-12-22 DIAGNOSIS — I480 Paroxysmal atrial fibrillation: Secondary | ICD-10-CM | POA: Diagnosis not present

## 2023-12-22 DIAGNOSIS — Z5181 Encounter for therapeutic drug level monitoring: Secondary | ICD-10-CM | POA: Diagnosis not present

## 2023-12-22 DIAGNOSIS — I10 Essential (primary) hypertension: Secondary | ICD-10-CM

## 2023-12-22 NOTE — Patient Instructions (Signed)
 Medication Instructions:   Your physician recommends that you continue on your current medications as directed. Please refer to the Current Medication list given to you today.   *If you need a refill on your cardiac medications before your next appointment, please call your pharmacy*    Lab Work:   PLEASE GO DOWN STAIRS  [facility]  FIRST FLOOR   ( GET OFF ELEVATORS WALK TOWARDS WAITING AREA LAB LOCATED BY PHARMACY):  BMET    If you have labs (blood work) drawn today and your tests are completely normal, you will receive your results only by: MyChart Message (if you have MyChart) OR A paper copy in the mail If you have any lab test that is abnormal or we need to change your treatment, we will call you to review the results.   Testing/Procedures: NONE ORDERED  TODAY     Follow-Up: At [facility], you and your health needs are our priority.  As part of our continuing mission to provide you with exceptional heart care, our providers are all part of one team.  This team includes your primary Cardiologist (physician) and Advanced Practice Providers or APPs (Physician Assistants and Nurse Practitioners) who all work together to provide you with the care you need, when you need it.  Your next appointment:   4 month(s) ( CONTACT  CASSIE HALL/ ANGELINE HAMMER FOR EP SCHEDULING ISSUES )   Provider:   You may see Will Cortland Ding, MD  or one of the following Advanced Practice Providers on your designated Care Team:   Mertha Abrahams, [place]   We recommend signing up for the patient portal called "MyChart".  Sign up information is provided on this After Visit Summary.  MyChart is used to connect with patients for Virtual Visits (Telemedicine).  Patients are able to view lab/test results, encounter notes, upcoming appointments, etc.  Non-urgent messages can be sent to your provider as well.   To learn more about what you can do with MyChart, go to [email or web address].   Other  Instructions

## 2023-12-23 ENCOUNTER — Ambulatory Visit: Payer: Self-pay | Admitting: Physician Assistant

## 2023-12-23 LAB — BASIC METABOLIC PANEL WITH GFR
BUN/Creatinine Ratio: 13 (ref 12–28)
BUN: 18 mg/dL (ref 8–27)
CO2: 22 mmol/L (ref 20–29)
Calcium: 9.2 mg/dL (ref 8.7–10.3)
Chloride: 101 mmol/L (ref 96–106)
Creatinine, Ser: 1.34 mg/dL — ABNORMAL HIGH (ref 0.57–1.00)
Glucose: 86 mg/dL (ref 70–99)
Potassium: 4.6 mmol/L (ref 3.5–5.2)
Sodium: 142 mmol/L (ref 134–144)
eGFR: 39 mL/min/{1.73_m2} — ABNORMAL LOW (ref >59)

## 2023-12-24 DIAGNOSIS — D692 Other nonthrombocytopenic purpura: Secondary | ICD-10-CM | POA: Diagnosis not present

## 2023-12-24 DIAGNOSIS — Z85828 Personal history of other malignant neoplasm of skin: Secondary | ICD-10-CM | POA: Diagnosis not present

## 2023-12-24 DIAGNOSIS — L814 Other melanin hyperpigmentation: Secondary | ICD-10-CM | POA: Diagnosis not present

## 2023-12-24 DIAGNOSIS — L821 Other seborrheic keratosis: Secondary | ICD-10-CM | POA: Diagnosis not present

## 2023-12-24 DIAGNOSIS — L570 Actinic keratosis: Secondary | ICD-10-CM | POA: Diagnosis not present

## 2023-12-29 DIAGNOSIS — H6121 Impacted cerumen, right ear: Secondary | ICD-10-CM | POA: Diagnosis not present

## 2024-01-01 DIAGNOSIS — I10 Essential (primary) hypertension: Secondary | ICD-10-CM | POA: Diagnosis not present

## 2024-01-01 DIAGNOSIS — R600 Localized edema: Secondary | ICD-10-CM | POA: Diagnosis not present

## 2024-01-01 DIAGNOSIS — I4821 Permanent atrial fibrillation: Secondary | ICD-10-CM | POA: Diagnosis not present

## 2024-01-01 DIAGNOSIS — E782 Mixed hyperlipidemia: Secondary | ICD-10-CM | POA: Diagnosis not present

## 2024-01-01 DIAGNOSIS — K2100 Gastro-esophageal reflux disease with esophagitis, without bleeding: Secondary | ICD-10-CM | POA: Diagnosis not present

## 2024-01-05 ENCOUNTER — Other Ambulatory Visit: Payer: Self-pay | Admitting: Cardiology

## 2024-01-05 DIAGNOSIS — I4719 Other supraventricular tachycardia: Secondary | ICD-10-CM

## 2024-01-05 DIAGNOSIS — I4819 Other persistent atrial fibrillation: Secondary | ICD-10-CM

## 2024-01-07 DIAGNOSIS — H02105 Unspecified ectropion of left lower eyelid: Secondary | ICD-10-CM | POA: Diagnosis not present

## 2024-01-07 DIAGNOSIS — L718 Other rosacea: Secondary | ICD-10-CM | POA: Diagnosis not present

## 2024-01-07 DIAGNOSIS — B880 Other acariasis: Secondary | ICD-10-CM | POA: Diagnosis not present

## 2024-01-20 DIAGNOSIS — I10 Essential (primary) hypertension: Secondary | ICD-10-CM | POA: Diagnosis not present

## 2024-01-20 DIAGNOSIS — E782 Mixed hyperlipidemia: Secondary | ICD-10-CM | POA: Diagnosis not present

## 2024-01-20 DIAGNOSIS — K2100 Gastro-esophageal reflux disease with esophagitis, without bleeding: Secondary | ICD-10-CM | POA: Diagnosis not present

## 2024-01-20 DIAGNOSIS — Z6829 Body mass index (BMI) 29.0-29.9, adult: Secondary | ICD-10-CM | POA: Diagnosis not present

## 2024-01-20 DIAGNOSIS — N1832 Chronic kidney disease, stage 3b: Secondary | ICD-10-CM | POA: Diagnosis not present

## 2024-01-21 ENCOUNTER — Encounter: Payer: Self-pay | Admitting: Internal Medicine

## 2024-01-22 ENCOUNTER — Other Ambulatory Visit (HOSPITAL_COMMUNITY): Payer: Self-pay | Admitting: Adult Health

## 2024-01-22 DIAGNOSIS — Z1231 Encounter for screening mammogram for malignant neoplasm of breast: Secondary | ICD-10-CM

## 2024-02-01 DIAGNOSIS — E782 Mixed hyperlipidemia: Secondary | ICD-10-CM | POA: Diagnosis not present

## 2024-02-01 DIAGNOSIS — I4821 Permanent atrial fibrillation: Secondary | ICD-10-CM | POA: Diagnosis not present

## 2024-02-01 DIAGNOSIS — R600 Localized edema: Secondary | ICD-10-CM | POA: Diagnosis not present

## 2024-02-01 DIAGNOSIS — I10 Essential (primary) hypertension: Secondary | ICD-10-CM | POA: Diagnosis not present

## 2024-02-01 DIAGNOSIS — K2100 Gastro-esophageal reflux disease with esophagitis, without bleeding: Secondary | ICD-10-CM | POA: Diagnosis not present

## 2024-02-24 DIAGNOSIS — B880 Other acariasis: Secondary | ICD-10-CM | POA: Diagnosis not present

## 2024-02-24 DIAGNOSIS — L718 Other rosacea: Secondary | ICD-10-CM | POA: Diagnosis not present

## 2024-02-24 DIAGNOSIS — H02105 Unspecified ectropion of left lower eyelid: Secondary | ICD-10-CM | POA: Diagnosis not present

## 2024-02-29 ENCOUNTER — Encounter (HOSPITAL_COMMUNITY): Payer: Self-pay

## 2024-02-29 ENCOUNTER — Ambulatory Visit (HOSPITAL_COMMUNITY)
Admission: RE | Admit: 2024-02-29 | Discharge: 2024-02-29 | Disposition: A | Discharge status: Home / Self Care | Source: Ambulatory Visit | Attending: Adult Health | Admitting: Adult Health

## 2024-02-29 DIAGNOSIS — Z1231 Encounter for screening mammogram for malignant neoplasm of breast: Secondary | ICD-10-CM | POA: Diagnosis not present

## 2024-03-01 DIAGNOSIS — J039 Acute tonsillitis, unspecified: Secondary | ICD-10-CM | POA: Diagnosis not present

## 2024-03-01 DIAGNOSIS — Z6829 Body mass index (BMI) 29.0-29.9, adult: Secondary | ICD-10-CM | POA: Diagnosis not present

## 2024-03-02 ENCOUNTER — Ambulatory Visit: Payer: Self-pay | Admitting: Adult Health

## 2024-03-03 DIAGNOSIS — I10 Essential (primary) hypertension: Secondary | ICD-10-CM | POA: Diagnosis not present

## 2024-03-03 DIAGNOSIS — K2100 Gastro-esophageal reflux disease with esophagitis, without bleeding: Secondary | ICD-10-CM | POA: Diagnosis not present

## 2024-03-03 DIAGNOSIS — I4821 Permanent atrial fibrillation: Secondary | ICD-10-CM | POA: Diagnosis not present

## 2024-03-03 DIAGNOSIS — R600 Localized edema: Secondary | ICD-10-CM | POA: Diagnosis not present

## 2024-03-03 DIAGNOSIS — E782 Mixed hyperlipidemia: Secondary | ICD-10-CM | POA: Diagnosis not present

## 2024-03-22 DIAGNOSIS — R09A2 Foreign body sensation, throat: Secondary | ICD-10-CM | POA: Diagnosis not present

## 2024-03-22 DIAGNOSIS — Z6829 Body mass index (BMI) 29.0-29.9, adult: Secondary | ICD-10-CM | POA: Diagnosis not present

## 2024-03-22 DIAGNOSIS — J029 Acute pharyngitis, unspecified: Secondary | ICD-10-CM | POA: Diagnosis not present

## 2024-04-01 DIAGNOSIS — I4821 Permanent atrial fibrillation: Secondary | ICD-10-CM | POA: Diagnosis not present

## 2024-04-01 DIAGNOSIS — K2100 Gastro-esophageal reflux disease with esophagitis, without bleeding: Secondary | ICD-10-CM | POA: Diagnosis not present

## 2024-04-01 DIAGNOSIS — I10 Essential (primary) hypertension: Secondary | ICD-10-CM | POA: Diagnosis not present

## 2024-04-01 DIAGNOSIS — E782 Mixed hyperlipidemia: Secondary | ICD-10-CM | POA: Diagnosis not present

## 2024-04-01 DIAGNOSIS — R600 Localized edema: Secondary | ICD-10-CM | POA: Diagnosis not present

## 2024-04-15 DIAGNOSIS — Z6829 Body mass index (BMI) 29.0-29.9, adult: Secondary | ICD-10-CM | POA: Diagnosis not present

## 2024-04-15 DIAGNOSIS — N61 Mastitis without abscess: Secondary | ICD-10-CM | POA: Diagnosis not present

## 2024-04-24 NOTE — Progress Notes (Unsigned)
 Cardiology Office Note Date:  04/24/2024  Patient ID:  Hailey [name], Hailey [name] 1941/04/12, MRN [id number] PCP:  Toribio Jerel MATSU, MD  Electrophysiologist: Dr. Inocencio    Chief Complaint:   flecainide  visit   History of Present Illness: Hailey [name] is a 83 y.o. female with history of AFib, RBBB, HTN, HLD, CKD (IIIb)  She saw Dr. Inocencio 01/28/23, mentioned a cough/hoarse voice she attributed to GERD, though had developed some CP, noting her pre-ablation CT nted low Ca burden, she was concerned about th symptoms, sounded atypical , planned for coronary CTa.  CTa not done  03/08/23: ER visit with c/o CP, reported intermittent for a month, woke with cp persisted and went for evaluation HS Trop neg x2 Labs unremarkable with some renal insufficiency noted not far from her baseline. EKG was SR w/RBBB  Saw GI 03/13/23: she also reported reduced exertional capacity over the last year getting winded earier.  Not suspected to be GI necessarily (with negative w/u in the past), have tried to escalate her PPI/acid reduction rx's though she has been intolerant.  Omeprazole  continued, rec to f/u with cardiology. Recent URI/coughing, perhaps chest walletiology  I saw her 03/31/23 The time line of her symptoms: Dec 2023, she developed an unusual URI-like couple days, a day of a non-productive cough followed by wheezing Treated for URI/bronchitis with abx, inhaler, dose pack  Sometime about march or so, started on PRN lasix  for new ankle/LE edema, xr 20mg  daily PRN, rarely has taken 10mg   July 4th was working outside  when she turned to go do something and had a sudden and very sharp jolt of pain in her chest, went and laid down and seemed to settle away, since about then has had a central CP Non-radiating, occurs most days, less aware of it when active/busy.  All along this has felt like she has had a slow down in general, fatigues easier, gets winded easier, though is able to get through her  ADLs. The edema continues, waxes/wanes  She is also having some weak spells, perhaps more so upon standing, but even after she is up and around feels like she is lightheaded, weak, on a few occasions had to sit down because she though she may faint  No palpitations Doesn't think she ha had and overt Afib at least No bleeding or signs of bleeding  Planned to pursue coronary CT never got done previously, update her echo and get a monitor   -- Tracings associated with her symptoms were normal rhythm and rate Did have some AFlutter/ATach/ though again heart rate controlled and was <1% of the time, and not associated with symptoms -- echo looked good, preserved LVEF, no VHD -- coronary CT w/mild non-obstructive CAD, negative radiology over read   I saw her 05/14/23 Remains with DOE, this is worrisome and bothersome to her Not progressive, though not better either No CP, palpitations No near syncope or syncope No bleeding or signs of bleeding On flecainide  RBBB pre-dates start of drug and intervals are stable Low burden on recent monitor Mild, 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 Inocencio, felt reasonable to continue her flecainide  DO not suspect to be cardia Recommended PMD/consider pulm eval BMET >> to guide diuretic consideration for poss HFpEF  Ultimately started on low dose lasix  > stable labs   I saw her Feb 2025 She is doing OK Continues to feel a bit nauseous and wobbly in the mornings > wears off  as she is up and around Sees GI, he did not think was GERD advised she take the Tylenol  at HS The small dose of lasix  did seem to improve her DOE, remains with some, but her ankle swelling is much improved. No CP, palpitations, bleeding, reported Discussed DOE likely multifactorial, lasix  increased  I saw her 12/22/23 She has some fleeting palpitations, they are not particularly bothersome. She has the same sharp central CP, not changing/escalation Not  exertional Edema is much better DOE is also much better but will get a little wheezing on occasion. Stable intervals Low burden No changes were made.  TODAY  *** symptoms, burden *** CP, atypical? *** flecainide , nodal blocker *** eliquis , dose, bleeding  Arrhythmia/AAD hx PVI ablation 08/20/2018 PVI ablation 05/11/2019 (arrived to the procedure in an ATach) Flecainide  started Feb 2020  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   Migraine headache    Palpitations    Restless leg syndrome    Vaginal atrophy     Past Surgical History:  Procedure Laterality Date   APPENDECTOMY     ATRIAL FIBRILLATION ABLATION N/A 08/20/2018   Procedure: ATRIAL FIBRILLATION ABLATION;  Surgeon: Inocencio Soyla Lunger, MD;  Location: [facility];  Service: Cardiovascular;  Laterality: N/A;   ATRIAL FIBRILLATION ABLATION N/A 05/11/2019   Procedure: ATRIAL FIBRILLATION ABLATION;  Surgeon: Inocencio Soyla Lunger, MD;  Location: [facility];  Service: Cardiovascular;  Laterality: N/A;   BIOPSY  06/06/2020   Procedure: BIOPSY;  Surgeon: Shaaron Lamar HERO, MD;  Location: [facility];  Service: Endoscopy;;  gastric   CARDIAC CATHETERIZATION  08/05/2003   cone   CARDIOVERSION N/A 05/02/2019   Procedure: CARDIOVERSION;  Surgeon: Lonni Slain, MD;  Location: [facility] ENDOSCOPY;  Service: Cardiovascular;  Laterality: N/A;   COLONOSCOPY     ESOPHAGOGASTRODUODENOSCOPY N/A 06/06/2020   benign gastric polyps and normal esophagus status post dilation, normal duodenum   HERNIA REPAIR     LEFT HEART CATHETERIZATION WITH CORONARY ANGIOGRAM N/A 04/12/2012   Procedure: LEFT HEART CATHETERIZATION WITH CORONARY ANGIOGRAM;  Surgeon: Lonni JONETTA Cash, MD;  Location: [facility] CATH LAB;  Service: Cardiovascular;  Laterality: N/A;   MALONEY DILATION  N/A 06/06/2020   Procedure: AGAPITO DILATION;  Surgeon: Shaaron Lamar HERO, MD;  Location: [facility];  Service: Endoscopy;  Laterality: N/A;   POLYPECTOMY  06/06/2020   Procedure: POLYPECTOMY;  Surgeon: Shaaron Lamar HERO, MD;  Location: [facility];  Service: Endoscopy;;  gastric   TOTAL ABDOMINAL HYSTERECTOMY      Current Outpatient Medications  Medication Sig Dispense Refill   Acetaminophen  (TYLENOL  ARTHRITIS PAIN PO) Take by mouth. 2 tabs 2 times per day     apixaban  (ELIQUIS ) 5 MG TABS tablet Take 5 mg by mouth 2 (two) times daily.     calcium  carbonate (TUMS - DOSED IN MG ELEMENTAL CALCIUM ) 500 MG chewable tablet Chew 1 tablet by mouth as needed for indigestion or heartburn.     diltiazem  (CARDIZEM  CD) 120 MG 24 hr capsule TAKE 1 CAPSULE BY MOUTH EVERY DAY 90 capsule 3   flecainide  (TAMBOCOR ) 50 MG tablet TAKE 1 TABLET BY MOUTH TWICE A DAY 180 tablet 3   fluticasone  (FLONASE ) 50 MCG/ACT nasal spray Place 2 sprays into both nostrils as needed.  furosemide  (LASIX ) 20 MG tablet Take 1 tablet (20 mg total) by mouth daily. 90 tablet 2   gabapentin  (NEURONTIN ) 100 MG capsule Take 200 mg by mouth. Take two capsules (200 mg dose) by mouth at bedtime.     nystatin cream (MYCOSTATIN) Apply 1 Application topically as needed.     omeprazole  (PRILOSEC) 20 MG capsule Take 20 mg by mouth 2 (two) times daily before a meal.     OVER THE COUNTER MEDICATION Take 1 capsule by mouth daily. Focus Select Areds2 for macular degeneration     No current facility-administered medications for this visit.    Allergies:   Amiodarone , Dexilant [dexlansoprazole], Flomax [tamsulosin hcl], Nexium  [esomeprazole ], and Pantoprazole  sodium   Social History:  The patient  reports that she has never smoked. She has never used smokeless tobacco. She reports that she does not currently use alcohol . She reports that she does not use drugs.   Family History:  The patient's family history includes Coronary artery disease in  her father; Heart attack in her brother; Heart disease in her brother; Hypertension in her brother and mother; Other in her brother, sister, and sister; Stroke in her brother and brother; Transient ischemic attack in her brother.  ROS:  Please see the history of present illness.    All other systems are reviewed and otherwise negative.   PHYSICAL EXAM:  VS:  There were no vitals taken for this visit. BMI: There is no height or weight on file to calculate BMI. Well nourished, well developed, in no acute distress HEENT: normocephalic, atraumatic Neck: no JVD, carotid bruits or masses Cardiac: *** RRR; no significant murmurs, no rubs, or gallops Lungs: *** CTA b/l, no wheezing, rhonchi or rales Abd: soft, nontender MS: no deformity or atrophy Ext: *** no edema Skin: warm and dry, no rash Neuro:  No gross deficits appreciated Psych: euthymic mood, full affect    EKG:  Done today and reviewed by myself shows  ***  SB 59bpm, PR , (personally measured ), RBBB, QRS , QTc  SB 56bpm, PR , RBBB, QRS , QTc SR 1st degree AVBlock , RBBB  SR 79bpm, 1st degree AVBlock , RBBB ( ) PR wobbles, was in 2020 Pre-flecainide   PR , RBBB > in 2020  04/08/23: Coronary CT IMPRESSION: 1. Coronary calcium  score of 99. This was 44th percentile for age and sex matched control.   2. Total plaque volume 247mm3 which is 31st percentile for age and sex-matched controls (calcified plaque 20mm3; noncalcified plaque 22mm3). TPV is moderate   3.  Normal coronary origin with right dominance.   4.  Nonobstructive CAD   5.  Mixed plaque in proximal LAD causes mild (25-49%) stenosis   6.  Minimal (0-24%) stenosis in mid LAD and proximal RCA   CAD-RADS 2. Mild non-obstructive CAD (25-49%). Consider non-atherosclerotic causes of chest pain. Consider preventive therapy and risk factor modification.  04/17/23: TTE 1. Left  ventricular ejection fraction, by estimation, is 60 to 65%. The  left ventricle has normal function. The left ventricle has no regional  wall motion abnormalities. Left ventricular diastolic function could not  be evaluated.   2. Right ventricular systolic function is low normal. The right  ventricular size is normal. Tricuspid regurgitation signal is inadequate  for assessing PA pressure.   3. The mitral valve is grossly normal. Mild mitral valve regurgitation.   4. The aortic valve is tricuspid. Aortic valve regurgitation is mild.  Aortic valve  sclerosis/calcification is present, without any evidence of  aortic stenosis.   5. The inferior vena cava is normal in size with greater than 50%  respiratory variability, suggesting right atrial pressure of 3 mmHg.   Sept 2024: monitor Patient had a min HR of 46 bpm, max HR of 125 bpm, and avg HR of 68 bpm.  Predominant underlying rhythm was Sinus Rhythm.  Atrial Flutter versus atrial tachycardia occurred (<1% burden), heart rate 64-125 bpm (avg of 94 bpm), longest 37 mins 35 secs, avg rate of 94 bpm.  Less than 1% ventricular and supraventricular ectopy Patient triggered episodes associated with sinus rhythm   05/19/2021: TTE 1. Left ventricular ejection fraction, by estimation, is 60 to 65%. The  left ventricle has normal function. The left ventricle has no regional  wall motion abnormalities. There is mild concentric left ventricular  hypertrophy. Left ventricular diastolic  parameters are consistent with Grade II diastolic dysfunction  (pseudonormalization).   2. Right ventricular systolic function is normal. The right ventricular  size is normal.   3. The mitral valve is normal in structure. Mild mitral valve  regurgitation. No evidence of mitral stenosis.   4. The aortic valve is normal in structure. Aortic valve regurgitation is  mild to moderate. No aortic stenosis is present.   5. The inferior vena cava is normal in size with  greater than 50%  respiratory variability, suggesting right atrial pressure of 3 mmHg.     05/11/2019: EPS/ablation CONCLUSIONS: 1. Atrial tachycardia upon presentation.   2. Successful electrical isolation and anatomical encircling of the left pulmonary veins with radiofrequency current.  A WACA approach was used 3. Additional left atrial ablation was performed along the roof of the left atrium 4. No early apparent complications.  08/20/2018: EPS/ablation CONCLUSIONS: 1. Sinus rhythm upon presentation.   2. Successful electrical isolation and anatomical encircling of all four pulmonary veins with radiofrequency current.    3. Cavo-tricuspid isthmus ablation was performed with complete bidirectional isthmus block achieved.  4. No inducible arrhythmias following ablation both on and off of dobutamine  5. No early apparent complications.   05/04/2019: cardiac CT IMPRESSION: 1. Calcium  score 40 limited to LAD this is only 39 % for age and sex. Non obstructive 1-24% calcific plaque in proximal LAD 2.  Severe RAE and moderate LAE No LAA thrombus 3.  Normal aortic root 3.2 cm 4.  Normal PV anatomy including small right middle PV 5.  No pericardial effusion IMPRESSION: Cardiomegaly.  No acute extra cardiac abnormality.   Recent Labs: 09/29/2023: Hemoglobin 12.0; Platelets 294 12/22/2023: BUN 18; Creatinine, Ser 1.34; Potassium 4.6; Sodium 142  No results found for requested labs within last 365 days.   CrCl cannot be calculated (Patient's most recent lab result is older than the maximum 21 days allowed.).   Wt Readings from Last 3 Encounters:  12/22/23 190 lb 12.8 oz (86.5 kg)  09/21/23 191 lb 12.8 oz (87 kg)  07/06/23 193 lb (87.5 kg)     Other studies reviewed: Additional studies/records reviewed today include: summarized above  ASSESSMENT AND PLAN:  Paroxysmal Afib ATach CHA2DS2Vasc is 4, on Eliquis , *** appropriately dosed flecainide  w/dilt, *** stable intervals ***  Low/no burden by symptoms   HTN *** No changes today  Secondary hypercoagulable state 2/2 AFib  CP Mild non-obstructive CAD by CT Sept 2024 Preserved LVEF, no VHD Felt to be a non-cardiac etiology *** Suspect GERD, vs musculoskeletal  6. DOE Much better Edema remains better Discussed age, sedentary/deconditioning weight all  may play a role on some DOE HFpEF ***   7.  Secondary hypercoagulable state    Disposition: back in *** 82mo, sooner if needed  Current medicines are reviewed at length with the patient today.  The patient did not have any concerns regarding medicines.  Bonney Charlies Arthur, PA-C 04/24/2024 9:12 AM     [facility] [address] [place] [address] [phone] (office)  [phone] (fax)

## 2024-04-25 ENCOUNTER — Ambulatory Visit: Attending: Physician Assistant | Admitting: Physician Assistant

## 2024-04-25 ENCOUNTER — Encounter: Payer: Self-pay | Admitting: Physician Assistant

## 2024-04-25 VITALS — BP 122/68 | HR 51 | Ht 67.0 in | Wt 189.0 lb

## 2024-04-25 DIAGNOSIS — I4719 Other supraventricular tachycardia: Secondary | ICD-10-CM | POA: Diagnosis not present

## 2024-04-25 DIAGNOSIS — D6869 Other thrombophilia: Secondary | ICD-10-CM

## 2024-04-25 DIAGNOSIS — I10 Essential (primary) hypertension: Secondary | ICD-10-CM

## 2024-04-25 DIAGNOSIS — Z5181 Encounter for therapeutic drug level monitoring: Secondary | ICD-10-CM

## 2024-04-25 DIAGNOSIS — R0602 Shortness of breath: Secondary | ICD-10-CM

## 2024-04-25 DIAGNOSIS — I480 Paroxysmal atrial fibrillation: Secondary | ICD-10-CM | POA: Diagnosis not present

## 2024-04-25 DIAGNOSIS — Z79899 Other long term (current) drug therapy: Secondary | ICD-10-CM

## 2024-04-25 NOTE — Patient Instructions (Signed)
 Medication Instructions:   Your physician recommends that you continue on your current medications as directed. Please refer to the Current Medication list given to you today.   *If you need a refill on your cardiac medications before your next appointment, please call your pharmacy*   Lab Work: NONE ORDERED  TODAY     If you have labs (blood work) drawn today and your tests are completely normal, you will receive your results only by: MyChart Message (if you have MyChart) OR A paper copy in the mail If you have any lab test that is abnormal or we need to change your treatment, we will call you to review the results.     Testing/Procedures: NONE ORDERED  TODAY      Follow-Up: At [facility], you and your health needs are our priority.  As part of our continuing mission to provide you with exceptional heart care, our providers are all part of one team.  This team includes your primary Cardiologist (physician) and Advanced Practice Providers or APPs (Physician Assistants and Nurse Practitioners) who all work together to provide you with the care you need, when you need it.   Your next appointment:  4 month(s)   Provider:   You may see Will Gladis Norton, MD or Charlies Arthur, PA-C ( CONTACT  CASSIE HALL/ ANGELINE HAMMER FOR EP SCHEDULING ISSUES ) \    We recommend signing up for the patient portal called MyChart.  Sign up information is provided on this After Visit Summary.  MyChart is used to connect with patients for Virtual Visits (Telemedicine).  Patients are able to view lab/test results, encounter notes, upcoming appointments, etc.  Non-urgent messages can be sent to your provider as well.   To learn more about what you can do with MyChart, go to [email or web address].   Other Instructions

## 2024-05-03 DIAGNOSIS — R600 Localized edema: Secondary | ICD-10-CM | POA: Diagnosis not present

## 2024-05-03 DIAGNOSIS — I4821 Permanent atrial fibrillation: Secondary | ICD-10-CM | POA: Diagnosis not present

## 2024-05-03 DIAGNOSIS — K2100 Gastro-esophageal reflux disease with esophagitis, without bleeding: Secondary | ICD-10-CM | POA: Diagnosis not present

## 2024-05-03 DIAGNOSIS — E782 Mixed hyperlipidemia: Secondary | ICD-10-CM | POA: Diagnosis not present

## 2024-05-03 DIAGNOSIS — I10 Essential (primary) hypertension: Secondary | ICD-10-CM | POA: Diagnosis not present

## 2024-05-12 DIAGNOSIS — L820 Inflamed seborrheic keratosis: Secondary | ICD-10-CM | POA: Diagnosis not present

## 2024-05-12 DIAGNOSIS — D485 Neoplasm of uncertain behavior of skin: Secondary | ICD-10-CM | POA: Diagnosis not present

## 2024-05-12 DIAGNOSIS — L821 Other seborrheic keratosis: Secondary | ICD-10-CM | POA: Diagnosis not present

## 2024-05-12 DIAGNOSIS — Z85828 Personal history of other malignant neoplasm of skin: Secondary | ICD-10-CM | POA: Diagnosis not present

## 2024-05-12 DIAGNOSIS — L814 Other melanin hyperpigmentation: Secondary | ICD-10-CM | POA: Diagnosis not present

## 2024-05-12 DIAGNOSIS — D2261 Melanocytic nevi of right upper limb, including shoulder: Secondary | ICD-10-CM | POA: Diagnosis not present

## 2024-05-12 DIAGNOSIS — D692 Other nonthrombocytopenic purpura: Secondary | ICD-10-CM | POA: Diagnosis not present

## 2024-05-12 DIAGNOSIS — D225 Melanocytic nevi of trunk: Secondary | ICD-10-CM | POA: Diagnosis not present

## 2024-05-12 DIAGNOSIS — D2262 Melanocytic nevi of left upper limb, including shoulder: Secondary | ICD-10-CM | POA: Diagnosis not present

## 2024-05-12 DIAGNOSIS — L570 Actinic keratosis: Secondary | ICD-10-CM | POA: Diagnosis not present

## 2024-05-25 DIAGNOSIS — Z1329 Encounter for screening for other suspected endocrine disorder: Secondary | ICD-10-CM | POA: Diagnosis not present

## 2024-05-25 DIAGNOSIS — N1832 Chronic kidney disease, stage 3b: Secondary | ICD-10-CM | POA: Diagnosis not present

## 2024-05-25 DIAGNOSIS — E559 Vitamin D deficiency, unspecified: Secondary | ICD-10-CM | POA: Diagnosis not present

## 2024-05-25 DIAGNOSIS — N183 Chronic kidney disease, stage 3 unspecified: Secondary | ICD-10-CM | POA: Diagnosis not present

## 2024-05-25 DIAGNOSIS — Z131 Encounter for screening for diabetes mellitus: Secondary | ICD-10-CM | POA: Diagnosis not present

## 2024-05-25 DIAGNOSIS — E7849 Other hyperlipidemia: Secondary | ICD-10-CM | POA: Diagnosis not present

## 2024-05-28 ENCOUNTER — Other Ambulatory Visit: Payer: Self-pay | Admitting: Physician Assistant

## 2024-06-01 DIAGNOSIS — Z6828 Body mass index (BMI) 28.0-28.9, adult: Secondary | ICD-10-CM | POA: Diagnosis not present

## 2024-06-01 DIAGNOSIS — N1832 Chronic kidney disease, stage 3b: Secondary | ICD-10-CM | POA: Diagnosis not present

## 2024-06-01 DIAGNOSIS — I4821 Permanent atrial fibrillation: Secondary | ICD-10-CM | POA: Diagnosis not present

## 2024-06-01 DIAGNOSIS — E042 Nontoxic multinodular goiter: Secondary | ICD-10-CM | POA: Diagnosis not present

## 2024-06-01 DIAGNOSIS — Z23 Encounter for immunization: Secondary | ICD-10-CM | POA: Diagnosis not present

## 2024-06-01 DIAGNOSIS — K2100 Gastro-esophageal reflux disease with esophagitis, without bleeding: Secondary | ICD-10-CM | POA: Diagnosis not present

## 2024-06-01 DIAGNOSIS — M1611 Unilateral primary osteoarthritis, right hip: Secondary | ICD-10-CM | POA: Diagnosis not present

## 2024-06-01 DIAGNOSIS — I10 Essential (primary) hypertension: Secondary | ICD-10-CM | POA: Diagnosis not present

## 2024-06-03 DIAGNOSIS — I4821 Permanent atrial fibrillation: Secondary | ICD-10-CM | POA: Diagnosis not present

## 2024-06-03 DIAGNOSIS — R600 Localized edema: Secondary | ICD-10-CM | POA: Diagnosis not present

## 2024-06-03 DIAGNOSIS — E782 Mixed hyperlipidemia: Secondary | ICD-10-CM | POA: Diagnosis not present

## 2024-06-03 DIAGNOSIS — K2100 Gastro-esophageal reflux disease with esophagitis, without bleeding: Secondary | ICD-10-CM | POA: Diagnosis not present

## 2024-06-03 DIAGNOSIS — I10 Essential (primary) hypertension: Secondary | ICD-10-CM | POA: Diagnosis not present

## 2024-06-07 DIAGNOSIS — E042 Nontoxic multinodular goiter: Secondary | ICD-10-CM | POA: Diagnosis not present

## 2024-06-09 DIAGNOSIS — J358 Other chronic diseases of tonsils and adenoids: Secondary | ICD-10-CM | POA: Diagnosis not present

## 2024-06-09 DIAGNOSIS — G8929 Other chronic pain: Secondary | ICD-10-CM | POA: Diagnosis not present

## 2024-06-09 DIAGNOSIS — M545 Low back pain, unspecified: Secondary | ICD-10-CM | POA: Diagnosis not present

## 2024-06-09 DIAGNOSIS — M47816 Spondylosis without myelopathy or radiculopathy, lumbar region: Secondary | ICD-10-CM | POA: Diagnosis not present

## 2024-06-09 DIAGNOSIS — M461 Sacroiliitis, not elsewhere classified: Secondary | ICD-10-CM | POA: Diagnosis not present

## 2024-06-09 DIAGNOSIS — M1611 Unilateral primary osteoarthritis, right hip: Secondary | ICD-10-CM | POA: Diagnosis not present

## 2024-06-09 DIAGNOSIS — M1711 Unilateral primary osteoarthritis, right knee: Secondary | ICD-10-CM | POA: Diagnosis not present

## 2024-06-09 DIAGNOSIS — G894 Chronic pain syndrome: Secondary | ICD-10-CM | POA: Diagnosis not present

## 2024-06-14 DIAGNOSIS — H903 Sensorineural hearing loss, bilateral: Secondary | ICD-10-CM | POA: Diagnosis not present

## 2024-06-17 DIAGNOSIS — B3731 Acute candidiasis of vulva and vagina: Secondary | ICD-10-CM | POA: Diagnosis not present

## 2024-06-17 DIAGNOSIS — R300 Dysuria: Secondary | ICD-10-CM | POA: Diagnosis not present

## 2024-06-17 DIAGNOSIS — N390 Urinary tract infection, site not specified: Secondary | ICD-10-CM | POA: Diagnosis not present

## 2024-06-17 DIAGNOSIS — Z6829 Body mass index (BMI) 29.0-29.9, adult: Secondary | ICD-10-CM | POA: Diagnosis not present

## 2024-06-22 ENCOUNTER — Ambulatory Visit (INDEPENDENT_AMBULATORY_CARE_PROVIDER_SITE_OTHER): Admitting: Adult Health

## 2024-06-22 ENCOUNTER — Encounter: Payer: Self-pay | Admitting: Adult Health

## 2024-06-22 VITALS — BP 137/74 | HR 55 | Ht 67.0 in | Wt 192.0 lb

## 2024-06-22 DIAGNOSIS — Z01419 Encounter for gynecological examination (general) (routine) without abnormal findings: Secondary | ICD-10-CM

## 2024-06-22 DIAGNOSIS — K649 Unspecified hemorrhoids: Secondary | ICD-10-CM | POA: Diagnosis not present

## 2024-06-22 DIAGNOSIS — N898 Other specified noninflammatory disorders of vagina: Secondary | ICD-10-CM | POA: Insufficient documentation

## 2024-06-22 DIAGNOSIS — Z90710 Acquired absence of both cervix and uterus: Secondary | ICD-10-CM | POA: Diagnosis not present

## 2024-06-22 DIAGNOSIS — N9489 Other specified conditions associated with female genital organs and menstrual cycle: Secondary | ICD-10-CM | POA: Diagnosis not present

## 2024-06-22 DIAGNOSIS — N952 Postmenopausal atrophic vaginitis: Secondary | ICD-10-CM | POA: Diagnosis not present

## 2024-06-22 DIAGNOSIS — Z1331 Encounter for screening for depression: Secondary | ICD-10-CM | POA: Diagnosis not present

## 2024-06-22 MED ORDER — TRIAMCINOLONE ACETONIDE 0.025 % EX OINT
TOPICAL_OINTMENT | CUTANEOUS | 0 refills | Status: AC
Start: 2024-06-22 — End: ?

## 2024-06-22 NOTE — Progress Notes (Signed)
 Patient ID: Hailey [name], female   DOB: Dec 28, 1940, 83 y.o.   MRN: [id number] History of Present Illness: Hailey [name] is a 83 year old white female, single, sp hysterectomy in for a well woman gyn exam, and is complaining of vaginal itching and burning outside. Was treated for UTI and tried OTC monistat, the monistat may have helped some. Has pain across breast at times, that comes and goes.  PCP is Dr Toribio.   Current Medications, Allergies, Past Medical History, Past Surgical History, Family History and Social History were reviewed in [employer] record.     Review of Systems: Patient denies any headaches, hearing loss, fatigue, blurred vision, shortness of breath, chest pain, abdominal pain, problems with bowel movements, urination, or intercourse(not active). No  mood swings.  Has pain in hips and knees at times, has arthritis See HPI for positives    Physical Exam:BP 137/74 (BP Location: Left Arm, Patient Position: Sitting, Cuff Size: Normal)   Pulse (!) 55   Ht 5' 7 (1.702 m)   Wt 192 lb (87.1 kg)   BMI 30.07 kg/m   General:  Well developed, well nourished, no acute distress Skin:  Warm and dry Neck:  Midline trachea, normal thyroid , good ROM, no lymphadenopathy, no carotid bruits heard  Lungs; Clear to auscultation bilaterally Breast:  No dominant palpable mass, retraction, or nipple discharge Cardiovascular: Regular rate and rhythm Abdomen:  Soft, non tender, no hepatosplenomegaly Pelvic:  External genitalia is normal in appearance, has mild redness on vulva on back to rectal area.  The vagina is atrophic. Urethra has no lesions or masses. The cervix and uterus are absent. No adnexal masses or tenderness noted.Bladder is non tender, no masses felt. Rectal: Deferred has small hemorrhoid  Extremities/musculoskeletal:  No swelling, +varicosities noted, no clubbing or cyanosis Psych:  No mood changes, alert and cooperative,seems happy AA is 0 Fall risk is  low    06/22/2024   11:45 AM 01/28/2017   10:43 AM 08/01/2016   10:18 AM  Depression screen PHQ 2/9  Decreased Interest 0 0 0  Down, Depressed, Hopeless 0 0 0  PHQ - 2 Score 0 0 0  Altered sleeping 1    Tired, decreased energy 2    Change in appetite 0    Feeling bad or failure about yourself  0    Trouble concentrating 0    Moving slowly or fidgety/restless 3    Suicidal thoughts 0    PHQ-9 Score 6         06/22/2024   11:46 AM  GAD 7 : Generalized Anxiety Score  Nervous, Anxious, on Edge 0  Control/stop worrying 0  Worry too much - different things 0  Trouble relaxing 0  Restless 0  Easily annoyed or irritable 0  Afraid - awful might happen 0  Total GAD 7 Score 0    Upstream - 06/22/24 1136       Pregnancy Intention Screening   Does the patient want to become pregnant in the next year? N/A    Does the patient's partner want to become pregnant in the next year? N/A    Would the patient like to discuss contraceptive options today? N/A      Contraception Wrap Up   Current Method Hysterectomy    End Method Hysterectomy    Contraception Counseling Provided No           Examination chaperoned by Clarita Salt LPN   Impression and plan: 1. Encounter for  well woman exam with routine gynecological exam (Primary) Physical with PCP Labs with PCP Mammogram was negative 02/29/24  2. Vaginal atrophy Declines vaginal estrogen for now  3. Itching in the vaginal area  4. Burning sensation of vulva Will rx kenalog ointment for external use on vulva  Meds ordered this encounter  Medications   triamcinolone  (KENALOG) 0.025 % ointment    Sig: Apply to external tissue only 2 x daily as needed    Dispense:  30 g    Refill:  0    Supervising Provider:   JAYNE MINDER H [[id number]]   Get squeeze bottle and fill with warm water  to use every time pees or has BM to splash over peri area and pat dry And try Aquaphor 3 N 1 cream as needed  Follow up prn   5. Hemorrhoids,  unspecified hemorrhoid type Try preparation H and see GI  6. S/P hysterectomy

## 2024-06-23 DIAGNOSIS — C44329 Squamous cell carcinoma of skin of other parts of face: Secondary | ICD-10-CM | POA: Diagnosis not present

## 2024-06-27 ENCOUNTER — Encounter: Payer: Self-pay | Admitting: Internal Medicine

## 2024-06-29 DIAGNOSIS — L570 Actinic keratosis: Secondary | ICD-10-CM | POA: Diagnosis not present

## 2024-06-29 DIAGNOSIS — Z85828 Personal history of other malignant neoplasm of skin: Secondary | ICD-10-CM | POA: Diagnosis not present

## 2024-06-29 DIAGNOSIS — L814 Other melanin hyperpigmentation: Secondary | ICD-10-CM | POA: Diagnosis not present

## 2024-06-29 DIAGNOSIS — D485 Neoplasm of uncertain behavior of skin: Secondary | ICD-10-CM | POA: Diagnosis not present

## 2024-06-29 DIAGNOSIS — D692 Other nonthrombocytopenic purpura: Secondary | ICD-10-CM | POA: Diagnosis not present

## 2024-07-01 DIAGNOSIS — R600 Localized edema: Secondary | ICD-10-CM | POA: Diagnosis not present

## 2024-07-01 DIAGNOSIS — E782 Mixed hyperlipidemia: Secondary | ICD-10-CM | POA: Diagnosis not present

## 2024-07-01 DIAGNOSIS — I10 Essential (primary) hypertension: Secondary | ICD-10-CM | POA: Diagnosis not present

## 2024-07-01 DIAGNOSIS — K2100 Gastro-esophageal reflux disease with esophagitis, without bleeding: Secondary | ICD-10-CM | POA: Diagnosis not present

## 2024-07-01 DIAGNOSIS — I4821 Permanent atrial fibrillation: Secondary | ICD-10-CM | POA: Diagnosis not present

## 2024-07-09 DIAGNOSIS — M4807 Spinal stenosis, lumbosacral region: Secondary | ICD-10-CM | POA: Diagnosis not present

## 2024-07-09 DIAGNOSIS — M48061 Spinal stenosis, lumbar region without neurogenic claudication: Secondary | ICD-10-CM | POA: Diagnosis not present

## 2024-07-09 DIAGNOSIS — M419 Scoliosis, unspecified: Secondary | ICD-10-CM | POA: Diagnosis not present

## 2024-07-09 DIAGNOSIS — M47817 Spondylosis without myelopathy or radiculopathy, lumbosacral region: Secondary | ICD-10-CM | POA: Diagnosis not present

## 2024-07-09 DIAGNOSIS — M47816 Spondylosis without myelopathy or radiculopathy, lumbar region: Secondary | ICD-10-CM | POA: Diagnosis not present

## 2024-08-17 NOTE — Progress Notes (Deleted)
 "    Cardiology Office Note Date:  08/17/2024  Patient ID:  Hailey [name], Vasbinder 1940/09/14, MRN [id number] PCP:  Toribio Jerel MATSU, MD  Electrophysiologist: Dr. Inocencio    Chief Complaint:  ***  flecainide  visit   History of Present Illness: Hailey [name] is a 84 y.o. female with history of AFib, RBBB, HTN, HLD, CKD (IIIb)  Last saw Dr. Inocencio June 2024  Seeing myself regularly since then  Most recently:  I saw her 04/25/24 All in all doing OK Joined silver sneakers, hasn't been able to go for a couple weeks due to other things she has had to do, but enjoying it. NO CP No rest SOB,. Exertional capacity is unchanged, able to do all of her ADLs without difficulty Edema about the same Admits she does not restrict sodium or elevate when seated. No dizziness, near syncope or syncope No bleeding or sign of bleeding Infrequent and brief episodes of AFib Sees her PMD Q46mo, gets labs regularly She mentions last year she told she had an asthmatic bronchitis At night sometimes she has a dry, hacking type cough, even a wheeze on occasion No productive cough Stable intervals (PR shorter) Pulsed lasix , advised /u with her PMD  TODAY  *** flecainide , EKG, intervals *** burden *** eliquis , dose, bleeding, labs   Arrhythmia/AAD hx PVI ablation 08/20/2018 PVI ablation 05/11/2019 (arrived to the procedure in an ATach) Flecainide  started Feb 2020  Past Medical History:  Diagnosis Date   Arthritis    Atrial fibrillation (HCC)    Breast nodule 11/16/2013   H/O hematuria    HLD (hyperlipidemia)    Hypertension    Meniere's disease    ringing in the ears   Migraine headache    Palpitations    Restless leg syndrome    Vaginal atrophy     Past Surgical History:  Procedure Laterality Date   APPENDECTOMY     ATRIAL FIBRILLATION ABLATION N/A 08/20/2018   Procedure: ATRIAL FIBRILLATION ABLATION;  Surgeon: Inocencio Soyla Lunger, MD;  Location: [facility];  Service:  Cardiovascular;  Laterality: N/A;   ATRIAL FIBRILLATION ABLATION N/A 05/11/2019   Procedure: ATRIAL FIBRILLATION ABLATION;  Surgeon: Inocencio Soyla Lunger, MD;  Location: [facility];  Service: Cardiovascular;  Laterality: N/A;   BIOPSY  06/06/2020   Procedure: BIOPSY;  Surgeon: Shaaron Lamar CHRISTELLA, MD;  Location: [facility];  Service: Endoscopy;;  gastric   CARDIAC CATHETERIZATION  08/05/2003   cone   CARDIOVERSION N/A 05/02/2019   Procedure: CARDIOVERSION;  Surgeon: Lonni Slain, MD;  Location: [facility] ENDOSCOPY;  Service: Cardiovascular;  Laterality: N/A;   COLONOSCOPY     ESOPHAGOGASTRODUODENOSCOPY N/A 06/06/2020   benign gastric polyps and normal esophagus status post dilation, normal duodenum   HERNIA REPAIR     LEFT HEART CATHETERIZATION WITH CORONARY ANGIOGRAM N/A 04/12/2012   Procedure: LEFT HEART CATHETERIZATION WITH CORONARY ANGIOGRAM;  Surgeon: Lonni JONETTA Cash, MD;  Location: [facility] CATH LAB;  Service: Cardiovascular;  Laterality: N/A;   MALONEY DILATION N/A 06/06/2020   Procedure: AGAPITO DILATION;  Surgeon: Shaaron Lamar CHRISTELLA, MD;  Location: [facility];  Service: Endoscopy;  Laterality: N/A;   POLYPECTOMY  06/06/2020   Procedure: POLYPECTOMY;  Surgeon: Shaaron Lamar CHRISTELLA, MD;  Location: [facility];  Service: Endoscopy;;  gastric   TOTAL ABDOMINAL HYSTERECTOMY      Current Outpatient Medications  Medication Sig Dispense Refill   Acetaminophen  (TYLENOL  ARTHRITIS PAIN PO) Take by mouth. 2 tabs 2 times per  day     apixaban  (ELIQUIS ) 5 MG TABS tablet Take 5 mg by mouth 2 (two) times daily.     calcium  carbonate (TUMS - DOSED IN MG ELEMENTAL CALCIUM ) 500 MG chewable tablet Chew 1 tablet by mouth as needed for indigestion or heartburn.     diltiazem  (CARDIZEM  CD) 120 MG 24 hr capsule TAKE 1 CAPSULE BY MOUTH EVERY DAY 90 capsule 3   flecainide  (TAMBOCOR ) 50 MG tablet TAKE 1 TABLET BY MOUTH TWICE A DAY 180 tablet 3   fluticasone  (FLONASE ) 50 MCG/ACT nasal spray Place 2  sprays into both nostrils as needed.     furosemide  (LASIX ) 20 MG tablet TAKE 1 TABLET BY MOUTH EVERY DAY 90 tablet 3   gabapentin  (NEURONTIN ) 100 MG capsule Take 200 mg by mouth. Take two capsules (200 mg dose) by mouth at bedtime.     omeprazole  (PRILOSEC) 20 MG capsule Take 20 mg by mouth 2 (two) times daily before a meal.     OVER THE COUNTER MEDICATION Take 1 capsule by mouth daily. Focus Select Areds2 for macular degeneration     triamcinolone  (KENALOG ) 0.025 % ointment Apply to external tissue only 2 x daily as needed 30 g 0   No current facility-administered medications for this visit.    Allergies:   Amiodarone , Dexilant [dexlansoprazole], Doxycycline, Flomax [tamsulosin hcl], Nexium  [esomeprazole ], and Pantoprazole  sodium   Social History:  The patient  reports that she has never smoked. She has never used smokeless tobacco. She reports that she does not currently use alcohol . She reports that she does not use drugs.   Family History:  The patient's family history includes Coronary artery disease in her father; Heart attack in her brother; Heart disease in her brother; Hypertension in her brother and mother; Other in her brother, sister, and sister; Stroke in her brother and brother; Transient ischemic attack in her brother.  ROS:  Please see the history of present illness.    All other systems are reviewed and otherwise negative.   PHYSICAL EXAM:  VS:  There were no vitals taken for this visit. BMI: There is no height or weight on file to calculate BMI. Well nourished, well developed, in no acute distress HEENT: normocephalic, atraumatic Neck: no JVD, carotid bruits or masses Cardiac: *** RRR; no significant murmurs, no rubs, or gallops Lungs: *** CTA b/l, no wheezing, rhonchi or rales Abd: soft, nontender MS: no deformity or atrophy Ext: *** no pitting edema Skin: warm and dry, no rash Neuro:  No gross deficits appreciated Psych: euthymic mood, full affect    EKG:  Done  today and reviewed by myself shows  ***  04/25/24: SB 51bpm, PR , RBBB, QRS , QTc SB 59bpm, PR , (personally measured ), RBBB, QRS , QTc SB 56bpm, PR , RBBB, QRS , QTc SR 1st degree AVBlock , RBBB  SR 79bpm, 1st degree AVBlock , RBBB ( ) PR wobbles, was in 2020 Pre-flecainide   PR , RBBB > in 2020  04/08/23: Coronary CT IMPRESSION: 1. Coronary calcium  score of 99. This was 44th percentile for age and sex matched control.   2. Total plaque volume 253mm3 which is 31st percentile for age and sex-matched controls (calcified plaque 63mm3; noncalcified plaque 249mm3). TPV is moderate   3.  Normal coronary origin with right dominance.   4.  Nonobstructive CAD   5.  Mixed plaque in proximal LAD causes mild (25-49%) stenosis   6.  Minimal (0-24%)  stenosis in mid LAD and proximal RCA   CAD-RADS 2. Mild non-obstructive CAD (25-49%). Consider non-atherosclerotic causes of chest pain. Consider preventive therapy and risk factor modification.  04/17/23: TTE 1. Left ventricular ejection fraction, by estimation, is 60 to 65%. The  left ventricle has normal function. The left ventricle has no regional  wall motion abnormalities. Left ventricular diastolic function could not  be evaluated.   2. Right ventricular systolic function is low normal. The right  ventricular size is normal. Tricuspid regurgitation signal is inadequate  for assessing PA pressure.   3. The mitral valve is grossly normal. Mild mitral valve regurgitation.   4. The aortic valve is tricuspid. Aortic valve regurgitation is mild.  Aortic valve sclerosis/calcification is present, without any evidence of  aortic stenosis.   5. The inferior vena cava is normal in size with greater than 50%  respiratory variability, suggesting right atrial pressure of 3 mmHg.   Sept 2024: monitor Patient had a min HR of 46 bpm, max HR of 125 bpm,  and avg HR of 68 bpm.  Predominant underlying rhythm was Sinus Rhythm.  Atrial Flutter versus atrial tachycardia occurred (<1% burden), heart rate 64-125 bpm (avg of 94 bpm), longest 37 mins 35 secs, avg rate of 94 bpm.  Less than 1% ventricular and supraventricular ectopy Patient triggered episodes associated with sinus rhythm   05/19/2021: TTE 1. Left ventricular ejection fraction, by estimation, is 60 to 65%. The  left ventricle has normal function. The left ventricle has no regional  wall motion abnormalities. There is mild concentric left ventricular  hypertrophy. Left ventricular diastolic  parameters are consistent with Grade II diastolic dysfunction  (pseudonormalization).   2. Right ventricular systolic function is normal. The right ventricular  size is normal.   3. The mitral valve is normal in structure. Mild mitral valve  regurgitation. No evidence of mitral stenosis.   4. The aortic valve is normal in structure. Aortic valve regurgitation is  mild to moderate. No aortic stenosis is present.   5. The inferior vena cava is normal in size with greater than 50%  respiratory variability, suggesting right atrial pressure of 3 mmHg.     05/11/2019: EPS/ablation CONCLUSIONS: 1. Atrial tachycardia upon presentation.   2. Successful electrical isolation and anatomical encircling of the left pulmonary veins with radiofrequency current.  A WACA approach was used 3. Additional left atrial ablation was performed along the roof of the left atrium 4. No early apparent complications.  08/20/2018: EPS/ablation CONCLUSIONS: 1. Sinus rhythm upon presentation.   2. Successful electrical isolation and anatomical encircling of all four pulmonary veins with radiofrequency current.    3. Cavo-tricuspid isthmus ablation was performed with complete bidirectional isthmus block achieved.  4. No inducible arrhythmias following ablation both on and off of dobutamine  5. No early apparent  complications.   05/04/2019: cardiac CT IMPRESSION: 1. Calcium  score 40 limited to LAD this is only 39 % for age and sex. Non obstructive 1-24% calcific plaque in proximal LAD 2.  Severe RAE and moderate LAE No LAA thrombus 3.  Normal aortic root 3.2 cm 4.  Normal PV anatomy including small right middle PV 5.  No pericardial effusion IMPRESSION: Cardiomegaly.  No acute extra cardiac abnormality.   Recent Labs: 09/29/2023: Hemoglobin 12.0; Platelets 294 12/22/2023: BUN 18; Creatinine, Ser 1.34; Potassium 4.6; Sodium 142  No results found for requested labs within last 365 days.   CrCl cannot be calculated (Patient's most recent lab result is older than the  maximum 21 days allowed.).   Wt Readings from Last 3 Encounters:  06/22/24 192 lb (87.1 kg)  04/25/24 189 lb (85.7 kg)  12/22/23 190 lb 12.8 oz (86.5 kg)     Other studies reviewed: Additional studies/records reviewed today include: summarized above  ASSESSMENT AND PLAN:  Paroxysmal Afib ATach CHA2DS2Vasc is 4, on Eliquis , *** appropriately dosed flecainide  w/dilt, *** stable intervals, *** PR interval shorter *** Low burden by symptoms   HTN *** No changes today  Secondary hypercoagulable state 2/2 AFib  CP Mild non-obstructive CAD by CT Sept 2024 Preserved LVEF, no VHD Felt to be a non-cardiac etiology *** Not a complaint today  6. DOE Lungs are clear Intermittent Nighttime dry cough ? GERD Occasional wheezing Advised to d/w her PMD  ***   7.  Secondary hypercoagulable state    Disposition: back in  ***40mo again, sooner if needed  Current medicines are reviewed at length with the patient today.  The patient did not have any concerns regarding medicines.  Bonney Charlies Arthur, PA-C 08/17/2024 10:24 AM     [facility] [address] [place] [address] [phone] (office)  [phone] (fax)   "

## 2024-08-18 ENCOUNTER — Encounter: Payer: Self-pay | Admitting: Cardiology

## 2024-08-18 ENCOUNTER — Ambulatory Visit: Admitting: Cardiology

## 2024-08-18 VITALS — BP 124/58 | HR 59 | Ht 67.0 in | Wt 190.0 lb

## 2024-08-18 DIAGNOSIS — I10 Essential (primary) hypertension: Secondary | ICD-10-CM | POA: Diagnosis not present

## 2024-08-18 DIAGNOSIS — R600 Localized edema: Secondary | ICD-10-CM | POA: Diagnosis not present

## 2024-08-18 DIAGNOSIS — Z5181 Encounter for therapeutic drug level monitoring: Secondary | ICD-10-CM | POA: Diagnosis not present

## 2024-08-18 DIAGNOSIS — D6869 Other thrombophilia: Secondary | ICD-10-CM

## 2024-08-18 DIAGNOSIS — Z79899 Other long term (current) drug therapy: Secondary | ICD-10-CM | POA: Diagnosis not present

## 2024-08-18 DIAGNOSIS — R0602 Shortness of breath: Secondary | ICD-10-CM

## 2024-08-18 DIAGNOSIS — I480 Paroxysmal atrial fibrillation: Secondary | ICD-10-CM

## 2024-08-18 NOTE — Patient Instructions (Signed)
 Medication Instructions:   Your physician recommends that you continue on your current medications as directed. Please refer to the Current Medication list given to you today.   *If you need a refill on your cardiac medications before your next appointment, please call your pharmacy*    If you have labs (blood work) drawn today and your tests are completely normal, you will receive your results only by: MyChart Message (if you have MyChart) OR A paper copy in the mail If you have any lab test that is abnormal or we need to change your treatment, we will call you to review the results.   Testing/Procedures: NONE ORDERED  TODAY   Follow-Up: At [facility], you and your health needs are our priority.  As part of our continuing mission to provide you with exceptional heart care, our providers are all part of one team.  This team includes your primary Cardiologist (physician) and Advanced Practice Providers or APPs (Physician Assistants and Nurse Practitioners) who all work together to provide you with the care you need, when you need it.  Your next appointment:    6 month(s)  Provider:    Charlies Arthur, PA-C or Artist Pouch, PA-C    We recommend signing up for the patient portal called MyChart.  Sign up information is provided on this After Visit Summary.  MyChart is used to connect with patients for Virtual Visits (Telemedicine).  Patients are able to view lab/test results, encounter notes, upcoming appointments, etc.  Non-urgent messages can be sent to your provider as well.   To learn more about what you can do with MyChart, go to [email or web address].   Other Instructions

## 2024-08-18 NOTE — Progress Notes (Signed)
" °  Electrophysiology Office Note:   Date:  08/18/2024  ID:  ISLEY WEISHEIT, DOB 08/14/1940, MRN [id number]  Primary Cardiologist: Pearla Rout, MD (Inactive) Electrophysiologist: Will Gladis Norton, MD   Electrophysiologist:  Soyla Gladis Norton, MD      History of Present Illness:   Hailey [name] is a 84 y.o. female with h/o atrial fibrillation status post ablation, right bundle branch block, hypertension, hyperlipidemia, chronic kidney disease stage IIIb seen today for routine electrophysiology followup.   Patient with nonspecific chest pain over the last couple of years, had coronary CTA in 2024 that showed mild nonobstructive coronary artery disease (calcium  score 99). Today she denies recurrent discomfort. Continues to have some baseline shortness of breath with physical activity. States that she saw her PCP last week and did discuss symptoms. No current plans to refer for further evaluation. She reports recent diagnosis of anemia, has started iron supplementation.   Regarding LE edema, reports no significant issues. Does note that it seems to be worse on days when she's on her feet more. Continues taking Lasix  20mg  daily. Discussed compression stockings, she's never worn these.   Regarding afib/palpitations, patient reports well managed symptoms. States that she feels fleeting palpitations at night if she wakes up but otherwise hasn't noted symptoms.   she denies chest pain, dyspnea, PND, orthopnea, nausea, vomiting, dizziness, syncope, edema, weight gain, or early satiety.   Review of systems complete and found to be negative unless listed in HPI.   EP Information / Studies Reviewed:    EKG is ordered today. Personal review as below.  EKG Interpretation Date/Time:  Thursday August 18 2024 10:10:23 EST Ventricular Rate:  59 PR Interval:    QRS Duration:  126 QT Interval:  444 QTC Calculation: 439 R Axis:   94  Text Interpretation: sinus bradycardia Right bundle branch  block stable PR interval Confirmed by Trudy Birmingham [phone]) on 08/18/2024 10:34:19 AM    Arrhythmia/Device History  PVI ablation 08/20/2018, 05/11/2019 Flecainide : Initiated February 2020  Physical Exam:   VS:  BP (!) 124/58   Pulse (!) 59   Ht 5' 7 (1.702 m)   Wt 190 lb (86.2 kg)   SpO2 96%   BMI 29.76 kg/m    Wt Readings from Last 3 Encounters:  08/18/24 190 lb (86.2 kg)  06/22/24 192 lb (87.1 kg)  04/25/24 189 lb (85.7 kg)     GEN: No acute distress NECK: No JVD; No carotid bruits CARDIAC: Regular rate and rhythm, no murmurs, rubs, gallops RESPIRATORY:  Clear to auscultation without rales, wheezing or rhonchi  ABDOMEN: Soft, non-tender, non-distended EXTREMITIES:  No edema; No deformity   ASSESSMENT AND PLAN:    Paroxysmal atrial fibrillation Secondary hypercoagulable state No symptoms suggestive of significant afib burden. ECG shows stable PR interval. Continue Flecainide  50mg  BID with Diltiazem  120mg  once daily. Continue Eliquis  5mg  BID. Will update BMET and CBC today.   LE edema 2024 TTE with normal LVEF. No edema appreciated on exam today. Encouraged PRN use of compression stockings. Continue Lasix  20mg . Will defer ongoing management to PCP.   Hypertension Blood pressure is well-controlled on current antihypertensive regimen. Continue current medications and dosing.   Anemia New diagnosis. Management per PCP.  Chronic dyspnea Stable symptoms per patient. With normal LVEF, no edema, reassuring CCTA, no indication of cardiac etiology.       Follow up with EP Team in 6 months.  Signed, [place] Trudy, PA-C  "

## 2024-08-19 ENCOUNTER — Ambulatory Visit: Payer: Self-pay | Admitting: Cardiology

## 2024-08-19 LAB — CBC
Hematocrit: 39.1 % (ref 34.0–46.6)
Hemoglobin: 13.1 g/dL (ref 11.1–15.9)
MCH: 32.7 pg (ref 26.6–33.0)
MCHC: 33.5 g/dL (ref 31.5–35.7)
MCV: 98 fL — ABNORMAL HIGH (ref 79–97)
Platelets: 262 x10E3/uL (ref 150–450)
RBC: 4.01 x10E6/uL (ref 3.77–5.28)
RDW: 13.4 % (ref 11.7–15.4)
WBC: 5.7 x10E3/uL (ref 3.4–10.8)

## 2024-08-19 LAB — BASIC METABOLIC PANEL WITH GFR
BUN/Creatinine Ratio: 18 (ref 12–28)
BUN: 23 mg/dL (ref 8–27)
CO2: 24 mmol/L (ref 20–29)
Calcium: 9.5 mg/dL (ref 8.7–10.3)
Chloride: 104 mmol/L (ref 96–106)
Creatinine, Ser: 1.29 mg/dL — ABNORMAL HIGH (ref 0.57–1.00)
Glucose: 83 mg/dL (ref 70–99)
Potassium: 4.3 mmol/L (ref 3.5–5.2)
Sodium: 143 mmol/L (ref 134–144)
eGFR: 41 mL/min/1.73 — ABNORMAL LOW
